# Patient Record
Sex: Female | Born: 1975
Health system: Southern US, Community
[De-identification: ages and names within clinical notes are randomized; demographics above are authoritative.]

## PROBLEM LIST (undated history)

## (undated) DIAGNOSIS — Z1379 Encounter for other screening for genetic and chromosomal anomalies: Principal | ICD-10-CM

## (undated) DIAGNOSIS — F32A Depression, unspecified: Secondary | ICD-10-CM

## (undated) DIAGNOSIS — K5900 Constipation, unspecified: Secondary | ICD-10-CM

## (undated) DIAGNOSIS — F329 Major depressive disorder, single episode, unspecified: Secondary | ICD-10-CM

## (undated) DIAGNOSIS — Z9641 Presence of insulin pump (external) (internal): Secondary | ICD-10-CM

## (undated) DIAGNOSIS — Z9889 Other specified postprocedural states: Secondary | ICD-10-CM

## (undated) DIAGNOSIS — Z973 Presence of spectacles and contact lenses: Secondary | ICD-10-CM

## (undated) DIAGNOSIS — K219 Gastro-esophageal reflux disease without esophagitis: Secondary | ICD-10-CM

## (undated) DIAGNOSIS — E049 Nontoxic goiter, unspecified: Secondary | ICD-10-CM

## (undated) DIAGNOSIS — R112 Nausea with vomiting, unspecified: Secondary | ICD-10-CM

## (undated) DIAGNOSIS — Z923 Personal history of irradiation: Secondary | ICD-10-CM

## (undated) DIAGNOSIS — C801 Malignant (primary) neoplasm, unspecified: Secondary | ICD-10-CM

## (undated) DIAGNOSIS — I509 Heart failure, unspecified: Secondary | ICD-10-CM

## (undated) DIAGNOSIS — E109 Type 1 diabetes mellitus without complications: Secondary | ICD-10-CM

## (undated) DIAGNOSIS — M65319 Trigger thumb, unspecified thumb: Secondary | ICD-10-CM

## (undated) HISTORY — PX: DILATION AND CURETTAGE OF UTERUS: SHX78

## (undated) HISTORY — DX: Encounter for other screening for genetic and chromosomal anomalies: Z13.79

## (undated) HISTORY — PX: TUBAL LIGATION: SHX77

---

## 1996-03-25 HISTORY — PX: DILATION AND CURETTAGE OF UTERUS: SHX78

## 1997-05-31 ENCOUNTER — Other Ambulatory Visit: Admission: RE | Admit: 1997-05-31 | Discharge: 1997-05-31 | Payer: Self-pay | Admitting: Obstetrics and Gynecology

## 1997-10-26 ENCOUNTER — Encounter: Admission: RE | Admit: 1997-10-26 | Discharge: 1998-01-24 | Payer: Self-pay | Admitting: Internal Medicine

## 1997-12-22 ENCOUNTER — Other Ambulatory Visit: Admission: RE | Admit: 1997-12-22 | Discharge: 1997-12-22 | Payer: Self-pay | Admitting: Obstetrics and Gynecology

## 1998-01-11 ENCOUNTER — Other Ambulatory Visit: Admission: RE | Admit: 1998-01-11 | Discharge: 1998-01-11 | Payer: Self-pay | Admitting: Obstetrics and Gynecology

## 1998-05-08 ENCOUNTER — Other Ambulatory Visit: Admission: RE | Admit: 1998-05-08 | Discharge: 1998-05-08 | Payer: Self-pay | Admitting: Obstetrics and Gynecology

## 1998-09-25 ENCOUNTER — Observation Stay (HOSPITAL_COMMUNITY): Admission: AD | Admit: 1998-09-25 | Discharge: 1998-09-26 | Payer: Self-pay | Admitting: Obstetrics and Gynecology

## 1998-09-26 ENCOUNTER — Encounter: Payer: Self-pay | Admitting: Obstetrics and Gynecology

## 1998-10-02 ENCOUNTER — Inpatient Hospital Stay (HOSPITAL_COMMUNITY): Admission: AD | Admit: 1998-10-02 | Discharge: 1998-10-02 | Payer: Self-pay | Admitting: Obstetrics and Gynecology

## 1998-10-21 ENCOUNTER — Encounter (INDEPENDENT_AMBULATORY_CARE_PROVIDER_SITE_OTHER): Payer: Self-pay

## 1998-10-21 ENCOUNTER — Inpatient Hospital Stay (HOSPITAL_COMMUNITY): Admission: AD | Admit: 1998-10-21 | Discharge: 1998-10-24 | Payer: Self-pay | Admitting: *Deleted

## 1998-10-25 ENCOUNTER — Encounter (HOSPITAL_COMMUNITY): Admission: RE | Admit: 1998-10-25 | Discharge: 1999-01-23 | Payer: Self-pay | Admitting: Obstetrics and Gynecology

## 1998-11-22 ENCOUNTER — Other Ambulatory Visit: Admission: RE | Admit: 1998-11-22 | Discharge: 1998-11-22 | Payer: Self-pay | Admitting: Obstetrics and Gynecology

## 1999-01-22 ENCOUNTER — Other Ambulatory Visit: Admission: RE | Admit: 1999-01-22 | Discharge: 1999-01-22 | Payer: Self-pay | Admitting: Obstetrics and Gynecology

## 1999-01-22 ENCOUNTER — Encounter (INDEPENDENT_AMBULATORY_CARE_PROVIDER_SITE_OTHER): Payer: Self-pay | Admitting: Specialist

## 1999-01-25 ENCOUNTER — Encounter (HOSPITAL_COMMUNITY): Admission: RE | Admit: 1999-01-25 | Discharge: 1999-04-25 | Payer: Self-pay | Admitting: Obstetrics and Gynecology

## 1999-07-02 ENCOUNTER — Other Ambulatory Visit: Admission: RE | Admit: 1999-07-02 | Discharge: 1999-07-02 | Payer: Self-pay | Admitting: Obstetrics and Gynecology

## 2000-03-24 ENCOUNTER — Other Ambulatory Visit: Admission: RE | Admit: 2000-03-24 | Discharge: 2000-03-24 | Payer: Self-pay | Admitting: Obstetrics and Gynecology

## 2001-02-11 ENCOUNTER — Other Ambulatory Visit: Admission: RE | Admit: 2001-02-11 | Discharge: 2001-02-11 | Payer: Self-pay | Admitting: Obstetrics and Gynecology

## 2001-07-17 ENCOUNTER — Inpatient Hospital Stay (HOSPITAL_COMMUNITY): Admission: AD | Admit: 2001-07-17 | Discharge: 2001-07-17 | Payer: Self-pay | Admitting: Obstetrics and Gynecology

## 2001-08-17 ENCOUNTER — Inpatient Hospital Stay (HOSPITAL_COMMUNITY): Admission: AD | Admit: 2001-08-17 | Discharge: 2001-08-17 | Payer: Self-pay | Admitting: Obstetrics and Gynecology

## 2001-08-19 ENCOUNTER — Ambulatory Visit (HOSPITAL_COMMUNITY): Admission: RE | Admit: 2001-08-19 | Discharge: 2001-08-19 | Payer: Self-pay | Admitting: Obstetrics and Gynecology

## 2001-08-20 ENCOUNTER — Inpatient Hospital Stay (HOSPITAL_COMMUNITY): Admission: AD | Admit: 2001-08-20 | Discharge: 2001-08-23 | Payer: Self-pay | Admitting: Obstetrics and Gynecology

## 2001-10-21 ENCOUNTER — Other Ambulatory Visit: Admission: RE | Admit: 2001-10-21 | Discharge: 2001-10-21 | Payer: Self-pay | Admitting: Obstetrics and Gynecology

## 2002-11-15 ENCOUNTER — Other Ambulatory Visit: Admission: RE | Admit: 2002-11-15 | Discharge: 2002-11-15 | Payer: Self-pay | Admitting: Obstetrics and Gynecology

## 2003-03-26 HISTORY — PX: LAPAROSCOPIC UNILATERAL SALPINGECTOMY: SHX5934

## 2003-03-26 HISTORY — PX: ESSURE TUBAL LIGATION: SUR464

## 2004-08-14 ENCOUNTER — Other Ambulatory Visit: Admission: RE | Admit: 2004-08-14 | Discharge: 2004-08-14 | Payer: Self-pay | Admitting: Obstetrics and Gynecology

## 2006-05-29 ENCOUNTER — Emergency Department (HOSPITAL_COMMUNITY): Admission: EM | Admit: 2006-05-29 | Discharge: 2006-05-30 | Payer: Self-pay | Admitting: Emergency Medicine

## 2007-03-02 ENCOUNTER — Ambulatory Visit (HOSPITAL_COMMUNITY): Admission: RE | Admit: 2007-03-02 | Discharge: 2007-03-02 | Payer: Self-pay | Admitting: Obstetrics and Gynecology

## 2007-03-26 HISTORY — PX: LAPAROSCOPIC UNILATERAL SALPINGECTOMY: SHX5934

## 2007-04-02 ENCOUNTER — Ambulatory Visit (HOSPITAL_COMMUNITY): Admission: RE | Admit: 2007-04-02 | Discharge: 2007-04-02 | Payer: Self-pay | Admitting: Obstetrics and Gynecology

## 2007-04-02 HISTORY — PX: LAPAROSCOPIC TUBAL LIGATION: SUR803

## 2010-04-15 ENCOUNTER — Encounter: Payer: Self-pay | Admitting: Obstetrics and Gynecology

## 2010-08-07 NOTE — Op Note (Signed)
NAME:  Stephanie Holden, Stephanie Holden                 ACCOUNT NO.:  [id number]   MEDICAL RECORD NO.:  [id number]          PATIENT TYPE:  AMB   LOCATION:  SDC                           FACILITY:  [facility]   PHYSICIAN:  Dineen Kid. Rana Snare, M.D.    DATE OF BIRTH:  06/27/1975   DATE OF PROCEDURE:  04/02/2007  DATE OF DISCHARGE:                               OPERATIVE REPORT   PREOPERATIVE DIAGNOSES:  1. Multiparity, desires sterility.  2. Failed Essure procedure.   POSTOPERATIVE DIAGNOSES:  1. Multiparity, desires sterility.  2. Failed Essure procedure.   PROCEDURE:  Laparoscopic bilateral tubal ligation with removal of Essure  device.   SURGEON:  Dineen Kid. Rana Snare, M.D.   ANESTHESIA:  General endotracheal.   INDICATIONS:  Stephanie Holden is a 75 year old G3, P2, A1, who had undergone an  Essure procedure for sterilization.  the procedure was complicated by  finding an extratubal ligation of the Essure device on HSG at 3 months'  time, showing also left tubal patency.  She desires definitive surgical  removal of the Essure device and tubal ligation.  We had discussed the  risks and benefits of the procedure at length, which include but not  limited to risk of infection, bleeding, damage to the uterus, tubes,  ovaries, bowel or bladder, possibility of the need to resect or remove  the left fallopian tube, risk of tubal failure quoted at 07/998.  She  does give her informed consent and wished to proceed.   FINDINGS AT THE TIME OF SURGERY:  A retroverted uterus with mild  endometriosis in the cul-de-sac.  The left fallopian tube is distorted  and the Essure device was superficially adhered to the left fundal wall  of the uterus with the end of the device just near the cornu of the  fallopian tube.  The fallopian tube deviated at a 90-degree after  insertion into the uterus and was of in such a position that placement  of the Essure device most likely was not likely due to the degree of  distortion of the fallopian tube.   The device had completely perforated  through the fallopian tube and was sitting on the outside portion of the  uterus with no adhesions to anything other than the uterus.  The she had  a normal-appearing liver, uterus, ovaries and, again, small  endometriosis implants in the cul-de-sac.   DESCRIPTION OF PROCEDURE:  After adequate analgesia, the patient placed  in the dorsal lithotomy position.  She is sterilely prepped and draped,  the bladder is sterilely drained.  A Graves speculum is placed and a  tenaculum placed on the anterior lip of the cervix.  A Cohen tenaculum  is placed.  A 1-cm infraumbilical skin incision was made.  A Veress  needle was inserted.  The abdomen was insufflated to dullness to  percussion.  An 11-mm trocar was inserted.  The above findings were  noted.  A 5-mm trocar was inserted to the left of the midline two  fingerbreadths above the pubic symphysis under direct visualization.  The device was easily grasped and removed without  injury to the  surrounding bowel or uterus and, again, was slightly adhered to the  outside portion of the uterus.  After careful evaluation of both  fallopian tubes, the midportion of each fallopian tube was grasped,  cautery was carried out over the 3-4 cm section of fallopian tube with  good thermal burn noted, loss of resistance noted on the ohmmeter, and  the fallopian tubes were confirmed by the fimbriated ends before the  cautery.  Careful examination of the fallopian tubes again revealed good  hemostasis and good thermal burn noted throughout the midportion of the  fallopian tubes.  The cul-de-sac endometriosis implants were ablated  with bipolar cautery, good thermal burn noted, and the implants ablated  completely.  The abdomen was then desufflated, trocars removed.  The  infraumbilical skin incision was closed with 0 Vicryl interrupted suture  in the fascia, 3-0 Vicryl Rapide subcuticular suture in the skin.  A 3-0  Vicryl  Rapide was also used to close the 5-mm site with good  approximation and good hemostasis.  Dermabond was then placed and 0.25%  Marcaine total of 8 mL used to inject the incisions.  The patient  tolerated the procedure well, was stable on transfer to the recovery  room.  Sponges and instrument count was normal x3.  The patient did  receive 1 g of cefotetan preoperatively and 30 mg of Toradol  postoperatively.   DISPOSITION:  The patient will be discharged home, will follow up in the  office in 2-3 weeks.  Sent home with a routine instruction sheet for  laparoscopy.  Told to return for increased pain, for bleeding.  She also  has prescription for Vicodin #20.      Dineen Kid Rana Snare, M.D.  Electronically Signed     DCL/MEDQ  D:  04/02/2007  T:  04/02/2007  Job:  [id number]

## 2010-08-10 NOTE — Op Note (Signed)
[facility]  Patient:    Stephanie Holden, Stephanie Holden Visit Number: [id number] MRN: [id number]          Service Type: OBS Location: 910A 9106 01 Attending Physician:  Trevor Iha Dictated by:   Trevor Iha, M.D. Proc. Date: 08/20/01 Admit Date:  08/20/2001                             Operative Report  PREOPERATIVE DIAGNOSES:       Intrauterine pregnancy at 65 1/[redacted] weeks gestational age, insulin-dependent diabetes, mature amniocentesis, previous cesarean section with desired repeat, spontaneous rupture of membranes.  POSTOPERATIVE DIAGNOSES:      Intrauterine pregnancy at 89 1/[redacted] weeks gestational age, insulin-dependent diabetes, mature amniocentesis, previous cesarean section with desired repeat, spontaneous rupture of membranes.  PROCEDURE:                    Repeat low segment transverse cesarean section.  SURGEON:                      Trevor Iha, M.D.  ANESTHESIA:                   Spinal.  ESTIMATED BLOOD LOSS:         800 cc.  INDICATIONS:                  Ms. Yin is a 35 year old G3, P1, A1 at 23 4/7 weeks who presents for repeat cesarean section.  She had an amniocentesis yesterday which showed a mature LS ratio and PG.  Previous cesarean section with desired repeat.  She presents for repeat cesarean section.  Fifteen minutes before the surgery she had spontaneous rupture of membranes.  Because of unknown GBS, went ahead and gave her 1 g of Cefotetan preoperatively. Risks and benefits were discussed.  Informed consent was obtained.  DESCRIPTION OF PROCEDURE:     After adequate analgesia the patient was placed in the supine position left lateral tilt.  She was sterilely prepped and draped.  The bladder was sterilely drained with a Foley catheter.  A Pfannenstiel skin incision was made two fingerbreadths above the pubic symphysis which was taken down sharply to the fascia, incised transversely, extended superiorly and  inferiorly off the bellies of the rectus muscles which were separated sharply in the midline.  Peritoneum was entered sharply. Bladder blade was placed.  Uterine serosa was elevated, nicked, incised transversely.  Bladder flap was created and placed behind the bladder blade. A low segment myotomy incision was made down to the infants vertex, extended laterally with the operators fingertips.  Infants vertex was delivered atraumatically.  The nares and pharynx were then suctioned.  Nuchal cord x1 was reduced.  The infant was then delivered.  Cord clamped and cut and handed to the pediatrician for resuscitation.  Cord blood was then obtained.  The placenta was extracted manually.  The uterus was exteriorized, wiped clean with a dry lap.  The myotomy incision was closed in two layers, the first being a running locking layer, the second being an embrocating layer with 0 Monocryl suture.  Hemostasis was then achieved with a suture of figure-of-eight of 0 Monocryl.  Uterus was placed back in the peritoneal cavity and after a copious amount of irrigation adequate hemostasis was assured.  The peritoneum was then closed with 0 Monocryl in a running fashion, rectus muscle plicated in  the midline.  The fascia was then closed with a #1 Vicryl in a running fashion.  Irritation was applied and after adequate hemostasis the skin was stapled and Steri-Strips applied.  The patient tolerated procedure well.  Was stable on transfer to the recovery room. Sponge and instrument count was normal x3.  Estimated blood loss 800 cc. Dictated by:   Trevor Iha, M.D. Attending Physician:  Trevor Iha DD:  08/20/01 TD:  08/21/01 Job: [id number]

## 2010-08-10 NOTE — Discharge Summary (Signed)
[facility]  Patient:    Stephanie Holden, Stephanie Holden Visit Number: [id number] MRN: [id number]          Service Type: OBS Location: 910A 9106 01 Attending Physician:  Trevor Iha Dictated by:   Sharene Skeans, N.P. Admit Date:  08/20/2001 Discharge Date: 08/23/2001                             Discharge Summary  ADMISSION DIAGNOSES: 1. Intrauterine pregnancy at 36-5/[redacted] weeks gestation. 2. Insulin-dependent diabetes mellitus with mature amniocentesis. 3. History of previous cesarean delivery, desires a repeat. 4. Spontaneous rupture of membranes.  DISCHARGE DIAGNOSIS:  Status post low transverse cesarean delivery to a viable female infant.  PROCEDURE:  Repeat low transverse cesarean delivery.  REASON FOR ADMISSION:  Please see dictated H&P.  HOSPITAL COURSE:  The patient was admitted to [facility] for the above named procedure at 36-4/[redacted] weeks gestation.  The patient had amniocentesis on previous day, which showed mature LS ratio with PG. Approximately 15 minutes prior to surgery, the patient spontaneously had rupture of membranes.  Due to unknown group B beta Strep status, IV antibiotics were administered.  The patient was taken to the operating room where spinal anesthesia was administered without complication.  A low transverse incision was made with the delivery of a viable female infant weighing 7 pounds 7 ounces.  Apgars were 9 at one minute and 9 at five minutes.  Umbilical cord pH was 7.39.  The patient tolerated the procedure well, and was taken to the recovery room in good condition.  On postoperative day #1, the patient had good return of bowel function.  She was tolerating a clear liquid diet without complaints of nausea and vomiting. Insulin drip was discontinued.  Low glucose level was noted at 111.  The patient was restarted on her own insulin pump.  Abdomen was soft.  Abdominal dressing was noted to have old  drainage noted on dressing.  A dressing was partially removed.  Incision was clean, dry, and intact.  Foley was discontinued, and the patient was voiding without difficulty.  Labs revealed a hemoglobin of 12.9, hematocrit 38.0, white blood cell count 10.3.  On postoperative day #2, abdomen was soft.  The patient was tolerating a regular diet without complaints of nausea and vomiting.  The patient was ambulating without difficulty, blood sugars were within normal limits.  On postoperative day #3, the patient was doing well.  Fundus was firm. Abdomen was soft.  Incision was clean, dry, and intact.  Staples were removed, and the patient was discharged home.  CONDITION ON DISCHARGE:  Good.  DIET:  Regular as tolerated with ADA diet.  ACTIVITY:  No heavy lifting, no driving x2 weeks, no vaginal entry.  FOLLOWUP:  The patient is to follow up in the office in one to two weeks for an incision check.  She is to call for a temperature greater than 100 degrees, persistent nausea and vomiting, heavy vaginal bleeding, and/or redness or drainage from the incision site.  DISCHARGE MEDICATIONS: 1. Tylox one q.4-6h. p.r.n. 2. Ibuprofen 600 mg q.6h. 3. Prenatal vitamins one p.o. q.d. 4. Colace one p.o. q.d. p.r.n. 5. Insulin pump per protocol. Dictated by:   Sharene Skeans, N.P. Attending Physician:  Trevor Iha DD:  09/09/01 TD:  09/10/01 Job: [id number]

## 2010-08-10 NOTE — H&P (Signed)
[facility]  Patient:    Stephanie Holden, Stephanie Holden Visit Number: [id number] MRN: [id number]          Service Type: OBS Location: MATC Attending Physician:  Soledad Gerlach Dictated by:   Trevor Iha, M.D. Admit Date:  08/17/2001 Discharge Date: 08/17/2001                           History and Physical  DATE OF BIRTH:                [birth year]  HISTORY OF PRESENT ILLNESS:   Stephanie Holden is a 35 year old, G3, P1, A1, at 36-4/7 weeks, who presents for repeat cesarean section.  Her pregnancy has been complicated by insulin-dependent diabetes controlled on insulin pump. She had preterm labor previously on Terbutaline.  She had an amniocentesis on Aug 19, 2001, with LS ratio of 4.2:1 and with PG consistent with a mature amniocentesis.  She also has a previous cesarean section and desired repeat cesarean section and presents for that today.  Her estimated date of confinement is September 12, 2001.  LABORATORY DATA:              She is O positive, antibody negative. VDRL nonreactive, rubella immune, hepatitis B negative.  PAST MEDICAL HISTORY:         Juvenile diabetes, beginning at age 62 on insulin pump.  She sees Dr. Wylene Simmer.  PAST SURGICAL HISTORY:        Cesarean section due to breech and laser surgery for abnormal Pap smear and a D&C in 1998 for a miscarriage.  MEDICATIONS:                  1. Insulin.                               2. Prenatal vitamins.  ALLERGIES:                    No known drug allergies.  PHYSICAL EXAMINATION:  VITAL SIGNS:                  Blood pressure is 122/84.  HEART:                        Regular rate and rhythm.  LUNGS:                        Clear to auscultation bilaterally.  ABDOMEN:                      Nondistended, nontender, gravid.  PELVIC:                       Cervix is closed, thick, and high.  IMPRESSION:                   1. Intrauterine pregnancy at 36-1/2 weeks.            2. Mature amniocentesis.                               3. Previous cesarean section.  4. Insulin-dependent diabetes.  PLAN:                         Repeat low segment transverse cesarean section. Risks and benefits were discussed at length which include but are not limited to risk of infection; bleeding; damage to uterus, tubes, ovaries, bowel, bladder, or fetus.  The patient does give her informed consent. Dictated by:   Trevor Iha, M.D. Attending Physician:  Soledad Gerlach DD:  08/19/01 TD:  08/19/01 Job: [id number]

## 2010-12-12 LAB — CBC
MCHC: 34.2
RDW: 13.1

## 2010-12-12 LAB — PREGNANCY, URINE: Preg Test, Ur: NEGATIVE

## 2010-12-12 LAB — BASIC METABOLIC PANEL
BUN: 10
Calcium: 9.3
Creatinine, Ser: 0.65
GFR calc Af Amer: >60

## 2011-10-08 ENCOUNTER — Other Ambulatory Visit: Payer: Self-pay | Admitting: Internal Medicine

## 2011-10-08 DIAGNOSIS — E041 Nontoxic single thyroid nodule: Secondary | ICD-10-CM

## 2011-10-16 ENCOUNTER — Other Ambulatory Visit (HOSPITAL_COMMUNITY)
Admission: RE | Admit: 2011-10-16 | Discharge: 2011-10-16 | Disposition: A | Discharge status: Home / Self Care | Payer: BC Managed Care – PPO | Source: Ambulatory Visit | Attending: Diagnostic Radiology | Admitting: Diagnostic Radiology

## 2011-10-16 ENCOUNTER — Ambulatory Visit
Admission: RE | Admit: 2011-10-16 | Discharge: 2011-10-16 | Disposition: A | Discharge status: Home / Self Care | Payer: BC Managed Care – PPO | Source: Ambulatory Visit | Attending: Internal Medicine | Admitting: Internal Medicine

## 2011-10-16 DIAGNOSIS — E041 Nontoxic single thyroid nodule: Secondary | ICD-10-CM

## 2011-10-16 DIAGNOSIS — E049 Nontoxic goiter, unspecified: Secondary | ICD-10-CM | POA: Insufficient documentation

## 2013-01-12 ENCOUNTER — Encounter: Payer: Self-pay | Admitting: Gynecology

## 2013-01-12 ENCOUNTER — Ambulatory Visit (INDEPENDENT_AMBULATORY_CARE_PROVIDER_SITE_OTHER): Payer: 59 | Admitting: Gynecology

## 2013-01-12 VITALS — BP 142/88 | Ht 63.0 in | Wt 147.0 lb

## 2013-01-12 DIAGNOSIS — N3000 Acute cystitis without hematuria: Secondary | ICD-10-CM

## 2013-01-12 DIAGNOSIS — E109 Type 1 diabetes mellitus without complications: Secondary | ICD-10-CM | POA: Insufficient documentation

## 2013-01-12 DIAGNOSIS — R319 Hematuria, unspecified: Secondary | ICD-10-CM

## 2013-01-12 LAB — POCT URINALYSIS DIPSTICK
Protein, UA: NEGATIVE
Urobilinogen, UA: NEGATIVE
pH, UA: 8

## 2013-01-12 MED ORDER — PHENAZOPYRIDINE HCL 200 MG PO TABS: 200.0000 mg | ORAL_TABLET | Freq: Three times a day (TID) | ORAL | Status: DC | PRN

## 2013-01-12 MED ORDER — CIPROFLOXACIN HCL 500 MG PO TABS: 500.0000 mg | ORAL_TABLET | Freq: Two times a day (BID) | ORAL | Status: DC

## 2013-01-12 NOTE — Progress Notes (Signed)
Pt here complaining of urinary frequency and urgency that began yesterday.  She started drinking cranberry juice and increased water but reports this am she noticed blood in her urine.  She denies any fever or chills and denies any history of recurrent UTI-last one was years ago.  Pt is long standing diabetic on pump with good control and reports that she has normal renal function.  Review of Systems  Constitutional: Negative for fever and chills.  Gastrointestinal: Negative for nausea and vomiting.  Genitourinary: Positive for dysuria, urgency, frequency and hematuria. Negative for flank pain.   There were no vitals taken for this visit. General appearance: alert, cooperative and appears stated age Back: no tenderness to percussion or palpation, no CVAT Neurologic: Grossly normal   Assessment:  Acute cystitis  Plan:   Cirprofloxin, pyridium see orders Urine culture

## 2013-01-13 ENCOUNTER — Ambulatory Visit: Payer: Self-pay | Admitting: Gynecology

## 2013-01-13 ENCOUNTER — Encounter: Payer: Self-pay | Admitting: Gynecology

## 2013-01-14 LAB — URINE CULTURE

## 2013-03-29 ENCOUNTER — Ambulatory Visit (INDEPENDENT_AMBULATORY_CARE_PROVIDER_SITE_OTHER): Payer: 59 | Admitting: Gynecology

## 2013-03-29 ENCOUNTER — Encounter: Payer: Self-pay | Admitting: Gynecology

## 2013-03-29 VITALS — BP 128/90 | HR 82 | Resp 14 | Ht 63.0 in | Wt 152.0 lb

## 2013-03-29 DIAGNOSIS — Z01419 Encounter for gynecological examination (general) (routine) without abnormal findings: Secondary | ICD-10-CM

## 2013-03-29 DIAGNOSIS — N63 Unspecified lump in unspecified breast: Secondary | ICD-10-CM

## 2013-03-29 DIAGNOSIS — Z124 Encounter for screening for malignant neoplasm of cervix: Secondary | ICD-10-CM

## 2013-03-29 DIAGNOSIS — N6320 Unspecified lump in the left breast, unspecified quadrant: Secondary | ICD-10-CM

## 2013-03-29 NOTE — Progress Notes (Signed)
38 y.o. Married Caucasian female   [phone] here for annual exam. Pt is currently sexually active. Cycles are regular, flow for 3-4d, no dyspareunia, no issues.  Diabetes well controled-HbA1c- 7.8.  Patient's last menstrual period was 03/08/2013.          Sexually active: yes  The current method of family planning is tubal ligation.    Exercising: yes  free weights 7x/qd  Last pap: 01/2012 Normal Alcohol: 1 drink/wk (social) Tobacco: no BSE:yes  Labs- Endocrinologist  Health Maintenance  Topic Date Due  . Pap Smear  07/21/1994  . Tetanus/tdap  07/21/1995  . Influenza Vaccine  10/23/2012    History reviewed. No pertinent family history.  Patient Active Problem List   Diagnosis Date Noted  . Diabetes type 1, controlled 01/12/2013    Past Medical History  Diagnosis Date  . Diabetes type 1, controlled     Past Surgical History  Procedure Laterality Date  . Cesarean section  2000  . Cesarean section  2003  . Laparoscopy with tubal ligation  2005    left salpingectomy  . Essure tubal ligation  2005    failled, tubal puncture    Allergies: Review of patient's allergies indicates no known allergies.  Current Outpatient Prescriptions  Medication Sig Dispense Refill  . benazepril (LOTENSIN) 10 MG tablet Take 10 mg by mouth daily. For kidney      . insulin lispro (HUMALOG) 100 UNIT/ML injection Inject into the skin 3 (three) times daily before meals.       No current facility-administered medications for this visit.    ROS: Pertinent items are noted in HPI.  Exam:    BP 128/90  Pulse 82  Resp 14  Ht 5\' 3"  (1.6 m)  Wt 152 lb (68.947 kg)  BMI 26.93 kg/m2  LMP 03/08/2013 Weight change: @WEIGHTCHANGE @ Last 3 height recordings:  Ht Readings from Last 3 Encounters:  03/29/13 5\' 3"  (1.6 m)  01/12/13 5\' 3"  (1.6 m)   General appearance: alert, cooperative and appears stated age Head: Normocephalic, without obvious abnormality, atraumatic Neck: no adenopathy, no  carotid bruit, no JVD, supple, symmetrical, trachea midline and thyroid not enlarged, symmetric, no tenderness/mass/nodules, small nodule on right Lungs: clear to auscultation bilaterally Breasts: normal appearance, no tenderness.  Cyst left breast at 10 o'clock at nipple, mobile Heart: regular rate and rhythm, S1, S2 normal, no murmur, click, rub or gallop Abdomen: soft, non-tender; bowel sounds normal; no masses,  no organomegaly Extremities: extremities normal, atraumatic, no cyanosis or edema Skin: Skin color, texture, turgor normal. No rashes or lesions Lymph nodes: Cervical, supraclavicular, and axillary nodes normal. no inguinal nodes palpated Neurologic: Grossly normal   Pelvic: External genitalia:  no lesions              Urethra: normal appearing urethra with no masses, tenderness or lesions              Bartholins and Skenes: normal                 Vagina: normal appearing vagina with normal color and discharge, no lesions              Cervix: normal appearance              Pap taken: yes        Bimanual Exam:  Uterus:  uterus is normal size, shape, consistency and nontender  Adnexa:    normal adnexa in size, nontender and no masses                                      Rectovaginal: Confirms                                      Anus:  normal sphincter tone, no lesions  A: well woman Left breast mass-cyst? thyroid nodule-biopsy negative    P: mammogram with u/s pap smear with HRHPV return annually or prn   An After Visit Summary was printed and given to the patient.

## 2013-03-30 ENCOUNTER — Other Ambulatory Visit: Payer: Self-pay | Admitting: Gynecology

## 2013-03-30 DIAGNOSIS — N6320 Unspecified lump in the left breast, unspecified quadrant: Secondary | ICD-10-CM

## 2013-04-01 LAB — IPS PAP TEST WITH HPV

## 2013-04-09 ENCOUNTER — Ambulatory Visit
Admission: RE | Admit: 2013-04-09 | Discharge: 2013-04-09 | Disposition: A | Discharge status: Home / Self Care | Payer: 59 | Source: Ambulatory Visit | Attending: Gynecology | Admitting: Gynecology

## 2013-04-09 DIAGNOSIS — N6320 Unspecified lump in the left breast, unspecified quadrant: Secondary | ICD-10-CM

## 2013-07-11 ENCOUNTER — Encounter (HOSPITAL_COMMUNITY): Payer: Self-pay | Admitting: Emergency Medicine

## 2013-07-11 ENCOUNTER — Emergency Department (HOSPITAL_COMMUNITY): Payer: 59

## 2013-07-11 ENCOUNTER — Emergency Department (HOSPITAL_COMMUNITY)
Admission: EM | Admit: 2013-07-11 | Discharge: 2013-07-11 | Disposition: A | Discharge status: Home / Self Care | Payer: 59 | Attending: Emergency Medicine | Admitting: Emergency Medicine

## 2013-07-11 DIAGNOSIS — S93409A Sprain of unspecified ligament of unspecified ankle, initial encounter: Secondary | ICD-10-CM | POA: Insufficient documentation

## 2013-07-11 DIAGNOSIS — E119 Type 2 diabetes mellitus without complications: Secondary | ICD-10-CM | POA: Insufficient documentation

## 2013-07-11 DIAGNOSIS — Y92838 Other recreation area as the place of occurrence of the external cause: Secondary | ICD-10-CM

## 2013-07-11 DIAGNOSIS — Z87891 Personal history of nicotine dependence: Secondary | ICD-10-CM | POA: Insufficient documentation

## 2013-07-11 DIAGNOSIS — X503XXA Overexertion from repetitive movements, initial encounter: Secondary | ICD-10-CM | POA: Insufficient documentation

## 2013-07-11 DIAGNOSIS — S93402A Sprain of unspecified ligament of left ankle, initial encounter: Secondary | ICD-10-CM

## 2013-07-11 DIAGNOSIS — Y9301 Activity, walking, marching and hiking: Secondary | ICD-10-CM | POA: Insufficient documentation

## 2013-07-11 DIAGNOSIS — Z794 Long term (current) use of insulin: Secondary | ICD-10-CM | POA: Insufficient documentation

## 2013-07-11 DIAGNOSIS — Y9239 Other specified sports and athletic area as the place of occurrence of the external cause: Secondary | ICD-10-CM | POA: Insufficient documentation

## 2013-07-11 MED ORDER — HYDROCODONE-ACETAMINOPHEN 5-325 MG PO TABS
1.0000 | ORAL_TABLET | Freq: Once | ORAL | Status: AC
  Administered 2013-07-11: 1 via ORAL
  Filled 2013-07-11: qty 1

## 2013-07-11 MED ORDER — HYDROCODONE-ACETAMINOPHEN 5-325 MG PO TABS: 1.0000 | ORAL_TABLET | Freq: Four times a day (QID) | ORAL | Status: DC | PRN

## 2013-07-11 MED ORDER — ETODOLAC ER 400 MG PO TB24: 400.0000 mg | ORAL_TABLET | Freq: Every day | ORAL | Status: DC

## 2013-07-11 MED ORDER — IBUPROFEN 400 MG PO TABS
600.0000 mg | ORAL_TABLET | Freq: Once | ORAL | Status: AC
  Administered 2013-07-11: 600 mg via ORAL
  Filled 2013-07-11 (×2): qty 1

## 2013-07-11 NOTE — Progress Notes (Signed)
Orthopedic Tech Progress Note Patient Details:  Stephanie Holden 1975/05/19 [id number]  Ortho Devices Type of Ortho Device: ASO;Crutches Ortho Device/Splint Location: LLE Ortho Device/Splint Interventions: Ordered;Application   Braulio Bosch 07/11/2013, 7:07 PM

## 2013-07-11 NOTE — ED Notes (Signed)
Ice pack applied to left ankle.

## 2013-07-11 NOTE — Discharge Instructions (Signed)
Keep your ankle elevated, ice several times a day, day off of it for one to 2 days. Keep ASO splint when walking around. Crutches for ambulation for several days. Followup with orthopedics doctor within the next week for recheck.   Ankle Sprain An ankle sprain is an injury to the strong, fibrous tissues (ligaments) that hold the bones of your ankle joint together.  CAUSES An ankle sprain is usually caused by a fall or by twisting your ankle. Ankle sprains most commonly occur when you step on the outer edge of your foot, and your ankle turns inward. People who participate in sports are more prone to these types of injuries.  SYMPTOMS   Pain in your ankle. The pain may be present at rest or only when you are trying to stand or walk.  Swelling.  Bruising. Bruising may develop immediately or within 1 to 2 days after your injury.  Difficulty standing or walking, particularly when turning corners or changing directions. DIAGNOSIS  Your caregiver will ask you details about your injury and perform a physical exam of your ankle to determine if you have an ankle sprain. During the physical exam, your caregiver will press on and apply pressure to specific areas of your foot and ankle. Your caregiver will try to move your ankle in certain ways. An X-ray exam may be done to be sure a bone was not broken or a ligament did not separate from one of the bones in your ankle (avulsion fracture).  TREATMENT  Certain types of braces can help stabilize your ankle. Your caregiver can make a recommendation for this. Your caregiver may recommend the use of medicine for pain. If your sprain is severe, your caregiver may refer you to a surgeon who helps to restore function to parts of your skeletal system (orthopedist) or a physical therapist. [place] ice to your injury for 1 2 days or as directed by your caregiver. Applying ice helps to reduce inflammation and pain.  Put ice in a plastic  bag.  Place a towel between your skin and the bag.  Leave the ice on for 15-20 minutes at a time, every 2 hours while you are awake.  Only take over-the-counter or prescription medicines for pain, discomfort, or fever as directed by your caregiver.  Elevate your injured ankle above the level of your heart as much as possible for 2 3 days.  If your caregiver recommends crutches, use them as instructed. Gradually put weight on the affected ankle. Continue to use crutches or a cane until you can walk without feeling pain in your ankle.  If you have a plaster splint, wear the splint as directed by your caregiver. Do not rest it on anything harder than a pillow for the first 24 hours. Do not put weight on it. Do not get it wet. You may take it off to take a shower or bath.  You may have been given an elastic bandage to wear around your ankle to provide support. If the elastic bandage is too tight (you have numbness or tingling in your foot or your foot becomes cold and blue), adjust the bandage to make it comfortable.  If you have an air splint, you may blow more air into it or let air out to make it more comfortable. You may take your splint off at night and before taking a shower or bath. Wiggle your toes in the splint several times per day to decrease swelling. [place]  CARE IF:   You have rapidly increasing bruising or swelling.  Your toes feel extremely cold or you lose feeling in your foot.  Your pain is not relieved with medicine. SEEK IMMEDIATE MEDICAL CARE IF:  Your toes are numb or blue.  You have severe pain that is increasing. MAKE SURE YOU:   Understand these instructions.  Will watch your condition.  Will get help right away if you are not doing well or get worse. Document Released: 03/11/2005 Document Revised: 12/04/2011 Document Reviewed: 03/23/2011 [facility] Patient Information 2014 [place], [place].

## 2013-07-11 NOTE — ED Notes (Signed)
Pt returned from xray    Family at bedside

## 2013-07-11 NOTE — Progress Notes (Signed)
Orthopedic Tech Progress Note Patient Details:  Stephanie Holden May 19, 1975 [id number]  Ortho Devices Type of Ortho Device: ASO;Crutches Ortho Device/Splint Location: LLE Ortho Device/Splint Interventions: Ordered;Application   Braulio Bosch 07/11/2013, 7:19 PM

## 2013-07-11 NOTE — ED Provider Notes (Signed)
CSN: [id number]     Arrival date & time 07/11/13  1622 History  This chart was scribed for non-physician practitioner working with Blanchie Dessert, MD by Mercy Moore, ED Scribe. This patient was seen in room TR10C/TR10C and the patient's care was started at 5:32 PM.   Chief Complaint  Patient presents with  . Foot Injury    Left foot      The history is provided by the patient. No language interpreter was used.   HPI Comments: Stephanie Holden is a 38 y.o. female who presents to the Emergency Department complaining of left ankle pain due to injury that occurred this afternoon about 2 hours ago. Patient reports stepping down from bleachers at her daughter's gymnastics meet and rolling her ankle. Patient has iced and bandaged the ankle.  Patient has not taken any pain medication. Patient reports previous left ankle injury a few years ago: sprain.  No broken bones. Patient states pain is worse with movement and unable to bear any weight on the ankle. Notes some knee pain as well. No foot pain.  Husband shares that patient is diabetic and her blood pressure was elevated today.   Past Medical History  Diagnosis Date  . Diabetes type 1, controlled    Past Surgical History  Procedure Laterality Date  . Cesarean section  2000  . Cesarean section  2003  . Laparoscopy with tubal ligation  2005    left salpingectomy  . Essure tubal ligation  2005    failled, tubal puncture   No family history on file. History  Substance Use Topics  . Smoking status: Former Research scientist (life sciences)  . Smokeless tobacco: Not on file  . Alcohol Use: Yes   OB History   Grav Para Term Preterm Abortions TAB SAB Ect Mult Living   3 2 1 1 1  1   2      Review of Systems  Musculoskeletal: Positive for arthralgias and joint swelling.  Neurological: Negative for weakness and numbness.      Allergies  Dilaudid  Home Medications   Prior to Admission medications   Medication Sig Start Date End Date Taking? Authorizing  Provider  benazepril (LOTENSIN) 10 MG tablet Take 10 mg by mouth daily. For kidney    Historical Provider, MD  insulin lispro (HUMALOG) 100 UNIT/ML injection Inject into the skin 3 (three) times daily before meals.    Historical Provider, MD   Triage Vitals: BP 141/86  Pulse 90  Temp(Src) 99.3 F (37.4 C) (Oral)  Resp 18  Ht 5\' 2"  (1.575 m)  Wt 150 lb (68.04 kg)  BMI 27.43 kg/m2  SpO2 99%  LMP 06/24/2013 Physical Exam  Nursing note and vitals reviewed. Constitutional: She is oriented to person, place, and time. She appears well-developed and well-nourished. No distress.  HENT:  Head: Normocephalic and atraumatic.  Eyes: EOM are normal.  Neck: Neck supple. No tracheal deviation present.  Cardiovascular: Normal rate.   Pulmonary/Chest: Effort normal. No respiratory distress.  Musculoskeletal: She exhibits tenderness.  Mild swelling noted to the left lateral malleolus of the ankle. Tender to palpation over medial and lateral malleoli. Pain with range of motion of the ankle in any direction. Foot is normal, no foot tenderness, dorsal pedal pulses intact. Knee tender over her lateral joint. Full range of motion of the knee joint. Joint is stable.  Neurological: She is alert and oriented to person, place, and time.  Skin: Skin is warm and dry.  Psychiatric: She has a normal mood  and affect. Her behavior is normal.    ED Course  Procedures (including critical care time) DIAGNOSTIC STUDIES: Oxygen Saturation is 99% on room air, normal by my interpretation.    COORDINATION OF CARE: 5:35 PM- Will order pain medication and X-ray of left ankle and knee. Discussed treatment plan with patient at bedside and patient agreed to plan.    Labs Review Labs Reviewed - No data to display  Imaging Review No results found.   EKG Interpretation None      MDM   Final diagnoses:  Sprain of ankle, left   Patient with a left ankle injury after stepping off of bleachers. X-rays obtained and  are negative. Joint is stable. Home with ASO splint, crutches, NSAIDs, ice, elevation, Norco 15 tablets for severe pain. Patient is to followup with orthopedic specialist.   I personally performed the services described in this documentation, which was scribed in my presence. The recorded information has been reviewed and is accurate.   Renold Genta, PA-C 07/11/13 1849

## 2013-07-11 NOTE — ED Provider Notes (Signed)
Medical screening examination/treatment/procedure(s) were performed by non-physician practitioner and as supervising physician I was immediately available for consultation/collaboration.   EKG Interpretation None        Blanchie Dessert, MD 07/11/13 2336

## 2013-07-11 NOTE — ED Notes (Signed)
Pt c/o left ankle pain. Pt was stepping off bleachers and rolled her ankle. Pt presents with icebag wrapped around her ankle

## 2013-07-15 ENCOUNTER — Other Ambulatory Visit: Payer: Self-pay | Admitting: Orthopedic Surgery

## 2013-10-21 ENCOUNTER — Encounter (HOSPITAL_BASED_OUTPATIENT_CLINIC_OR_DEPARTMENT_OTHER): Payer: Self-pay | Admitting: *Deleted

## 2013-10-21 NOTE — Pre-Procedure Instructions (Addendum)
To come for BMET and EKG Pt. will call Dr. Osborne Casco for instructions on Insulin pump settings for NPO status prior to surgery.

## 2013-10-25 ENCOUNTER — Encounter (HOSPITAL_BASED_OUTPATIENT_CLINIC_OR_DEPARTMENT_OTHER)
Admission: RE | Admit: 2013-10-25 | Discharge: 2013-10-25 | Disposition: A | Discharge status: Home / Self Care | Payer: 59 | Source: Ambulatory Visit | Attending: Orthopedic Surgery | Admitting: Orthopedic Surgery

## 2013-10-25 DIAGNOSIS — G5600 Carpal tunnel syndrome, unspecified upper limb: Secondary | ICD-10-CM | POA: Diagnosis present

## 2013-10-25 DIAGNOSIS — E109 Type 1 diabetes mellitus without complications: Secondary | ICD-10-CM | POA: Diagnosis not present

## 2013-10-25 DIAGNOSIS — Z885 Allergy status to narcotic agent status: Secondary | ICD-10-CM | POA: Diagnosis not present

## 2013-10-25 DIAGNOSIS — Z794 Long term (current) use of insulin: Secondary | ICD-10-CM | POA: Diagnosis not present

## 2013-10-25 DIAGNOSIS — Z9641 Presence of insulin pump (external) (internal): Secondary | ICD-10-CM | POA: Diagnosis not present

## 2013-10-25 LAB — BASIC METABOLIC PANEL
Anion gap: 12 (ref 5–15)
BUN: 14 mg/dL (ref 6–23)
CALCIUM: 9.3 mg/dL (ref 8.4–10.5)
CO2: 27 mEq/L (ref 19–32)
Chloride: 100 mEq/L (ref 96–112)
Creatinine, Ser: 0.81 mg/dL (ref 0.50–1.10)
GFR calc Af Amer: >90 mL/min (ref >90)
GFR calc non Af Amer: >90 mL/min (ref >90)
GLUCOSE: 221 mg/dL — AB (ref 70–99)
Potassium: 4.6 mEq/L (ref 3.7–5.3)
Sodium: 139 mEq/L (ref 137–147)

## 2013-10-28 ENCOUNTER — Encounter (HOSPITAL_BASED_OUTPATIENT_CLINIC_OR_DEPARTMENT_OTHER)
Admission: RE | Disposition: A | Discharge status: Home / Self Care | Payer: Self-pay | Source: Ambulatory Visit | Attending: Orthopedic Surgery

## 2013-10-28 ENCOUNTER — Encounter (HOSPITAL_BASED_OUTPATIENT_CLINIC_OR_DEPARTMENT_OTHER): Payer: Self-pay | Admitting: Certified Registered"

## 2013-10-28 ENCOUNTER — Encounter (HOSPITAL_BASED_OUTPATIENT_CLINIC_OR_DEPARTMENT_OTHER): Payer: 59 | Admitting: Anesthesiology

## 2013-10-28 ENCOUNTER — Ambulatory Visit (HOSPITAL_BASED_OUTPATIENT_CLINIC_OR_DEPARTMENT_OTHER)
Admission: RE | Admit: 2013-10-28 | Discharge: 2013-10-28 | Disposition: A | Discharge status: Home / Self Care | Payer: 59 | Source: Ambulatory Visit | Attending: Orthopedic Surgery | Admitting: Orthopedic Surgery

## 2013-10-28 ENCOUNTER — Ambulatory Visit (HOSPITAL_BASED_OUTPATIENT_CLINIC_OR_DEPARTMENT_OTHER): Payer: 59 | Admitting: Anesthesiology

## 2013-10-28 DIAGNOSIS — E109 Type 1 diabetes mellitus without complications: Secondary | ICD-10-CM | POA: Insufficient documentation

## 2013-10-28 DIAGNOSIS — Z9641 Presence of insulin pump (external) (internal): Secondary | ICD-10-CM | POA: Insufficient documentation

## 2013-10-28 DIAGNOSIS — Z794 Long term (current) use of insulin: Secondary | ICD-10-CM | POA: Insufficient documentation

## 2013-10-28 DIAGNOSIS — G5600 Carpal tunnel syndrome, unspecified upper limb: Secondary | ICD-10-CM | POA: Insufficient documentation

## 2013-10-28 DIAGNOSIS — Z885 Allergy status to narcotic agent status: Secondary | ICD-10-CM | POA: Insufficient documentation

## 2013-10-28 HISTORY — DX: Presence of insulin pump (external) (internal): Z96.41

## 2013-10-28 HISTORY — PX: CARPAL TUNNEL RELEASE: SHX101

## 2013-10-28 HISTORY — DX: Type 1 diabetes mellitus without complications: E10.9

## 2013-10-28 LAB — GLUCOSE, CAPILLARY
GLUCOSE-CAPILLARY: 208 mg/dL — AB (ref 70–99)
Glucose-Capillary: 193 mg/dL — ABNORMAL HIGH (ref 70–99)

## 2013-10-28 LAB — POCT HEMOGLOBIN-HEMACUE: HEMOGLOBIN: 14.7 g/dL (ref 12.0–15.0)

## 2013-10-28 SURGERY — CARPAL TUNNEL RELEASE
Anesthesia: General | Site: Wrist | Laterality: Bilateral

## 2013-10-28 MED ORDER — FENTANYL CITRATE 0.05 MG/ML IJ SOLN
INTRAMUSCULAR | Status: DC | PRN
  Administered 2013-10-28: 100 ug via INTRAVENOUS
  Administered 2013-10-28 (×2): 50 ug via INTRAVENOUS

## 2013-10-28 MED ORDER — BUPIVACAINE HCL (PF) 0.25 % IJ SOLN
INTRAMUSCULAR | Status: AC
  Filled 2013-10-28: qty 30

## 2013-10-28 MED ORDER — CEFAZOLIN SODIUM-DEXTROSE 2-3 GM-% IV SOLR
2.0000 g | INTRAVENOUS | Status: AC
  Administered 2013-10-28: 2 g via INTRAVENOUS

## 2013-10-28 MED ORDER — HYDROCODONE-ACETAMINOPHEN 5-325 MG PO TABS
ORAL_TABLET | ORAL | Status: AC
  Filled 2013-10-28: qty 1

## 2013-10-28 MED ORDER — PROPOFOL 10 MG/ML IV BOLUS
INTRAVENOUS | Status: DC | PRN
  Administered 2013-10-28: 200 mg via INTRAVENOUS
  Administered 2013-10-28 (×2): 20 mg via INTRAVENOUS

## 2013-10-28 MED ORDER — CHLORHEXIDINE GLUCONATE 4 % EX LIQD: 60.0000 mL | Freq: Once | CUTANEOUS | Status: DC

## 2013-10-28 MED ORDER — CEFAZOLIN SODIUM-DEXTROSE 2-3 GM-% IV SOLR
INTRAVENOUS | Status: AC
  Filled 2013-10-28: qty 50

## 2013-10-28 MED ORDER — MIDAZOLAM HCL 2 MG/2ML IJ SOLN: 1.0000 mg | INTRAMUSCULAR | Status: DC | PRN

## 2013-10-28 MED ORDER — HYDROCODONE-ACETAMINOPHEN 5-325 MG PO TABS
1.0000 | ORAL_TABLET | Freq: Once | ORAL | Status: AC | PRN
  Administered 2013-10-28: 1 via ORAL

## 2013-10-28 MED ORDER — LIDOCAINE HCL (CARDIAC) 20 MG/ML IV SOLN
INTRAVENOUS | Status: DC | PRN
  Administered 2013-10-28: 60 mg via INTRAVENOUS

## 2013-10-28 MED ORDER — ONDANSETRON HCL 4 MG/2ML IJ SOLN
INTRAMUSCULAR | Status: DC | PRN
  Administered 2013-10-28: 4 mg via INTRAVENOUS

## 2013-10-28 MED ORDER — MIDAZOLAM HCL 2 MG/ML PO SYRP: 12.0000 mg | ORAL_SOLUTION | Freq: Once | ORAL | Status: DC | PRN

## 2013-10-28 MED ORDER — FENTANYL CITRATE 0.05 MG/ML IJ SOLN: 50.0000 ug | INTRAMUSCULAR | Status: DC | PRN

## 2013-10-28 MED ORDER — SCOPOLAMINE 1 MG/3DAYS TD PT72
MEDICATED_PATCH | TRANSDERMAL | Status: AC
  Filled 2013-10-28: qty 1

## 2013-10-28 MED ORDER — FENTANYL CITRATE 0.05 MG/ML IJ SOLN
INTRAMUSCULAR | Status: AC
  Filled 2013-10-28: qty 4

## 2013-10-28 MED ORDER — MIDAZOLAM HCL 5 MG/5ML IJ SOLN
INTRAMUSCULAR | Status: DC | PRN
  Administered 2013-10-28: 2 mg via INTRAVENOUS

## 2013-10-28 MED ORDER — LACTATED RINGERS IV SOLN
INTRAVENOUS | Status: DC
  Administered 2013-10-28 (×2): via INTRAVENOUS

## 2013-10-28 MED ORDER — MIDAZOLAM HCL 2 MG/2ML IJ SOLN
INTRAMUSCULAR | Status: AC
  Filled 2013-10-28: qty 2

## 2013-10-28 MED ORDER — HYDROCODONE-ACETAMINOPHEN 5-325 MG PO TABS: 1.0000 | ORAL_TABLET | Freq: Four times a day (QID) | ORAL | Status: DC | PRN

## 2013-10-28 MED ORDER — ONDANSETRON HCL 4 MG/2ML IJ SOLN: 4.0000 mg | Freq: Once | INTRAMUSCULAR | Status: DC | PRN

## 2013-10-28 MED ORDER — FENTANYL CITRATE 0.05 MG/ML IJ SOLN
25.0000 ug | INTRAMUSCULAR | Status: DC | PRN
  Administered 2013-10-28 (×2): 50 ug via INTRAVENOUS

## 2013-10-28 MED ORDER — FENTANYL CITRATE 0.05 MG/ML IJ SOLN
INTRAMUSCULAR | Status: AC
  Filled 2013-10-28: qty 2

## 2013-10-28 MED ORDER — SCOPOLAMINE 1 MG/3DAYS TD PT72
1.0000 | MEDICATED_PATCH | TRANSDERMAL | Status: DC
  Administered 2013-10-28: 1.5 mg via TRANSDERMAL

## 2013-10-28 MED ORDER — BUPIVACAINE HCL (PF) 0.25 % IJ SOLN
INTRAMUSCULAR | Status: DC | PRN
  Administered 2013-10-28: 4.5 mL
  Administered 2013-10-28: 5.5 mL

## 2013-10-28 SURGICAL SUPPLY — 42 items
BLADE SURG 15 STRL LF DISP TIS (BLADE) ×1 IMPLANT
BLADE SURG 15 STRL SS (BLADE) ×1
BNDG COHESIVE 3X5 TAN STRL LF (GAUZE/BANDAGES/DRESSINGS) ×4 IMPLANT
BNDG ESMARK 4X9 LF (GAUZE/BANDAGES/DRESSINGS) ×2 IMPLANT
BNDG GAUZE ELAST 4 BULKY (GAUZE/BANDAGES/DRESSINGS) ×4 IMPLANT
CHLORAPREP W/TINT 26ML (MISCELLANEOUS) ×4 IMPLANT
CORDS BIPOLAR (ELECTRODE) ×2 IMPLANT
COVER MAYO STAND STRL (DRAPES) ×4 IMPLANT
COVER TABLE BACK 60X90 (DRAPES) ×2 IMPLANT
CUFF TOURNIQUET SINGLE 18IN (TOURNIQUET CUFF) ×4 IMPLANT
DERMABOND ADVANCED (GAUZE/BANDAGES/DRESSINGS) ×2
DERMABOND ADVANCED .7 DNX12 (GAUZE/BANDAGES/DRESSINGS) ×2 IMPLANT
DRAPE EXTREMITY T 121X128X90 (DRAPE) ×4 IMPLANT
DRAPE SURG 17X23 STRL (DRAPES) ×4 IMPLANT
DRSG PAD ABDOMINAL 8X10 ST (GAUZE/BANDAGES/DRESSINGS) ×4 IMPLANT
GAUZE SPONGE 4X4 12PLY STRL (GAUZE/BANDAGES/DRESSINGS) ×2 IMPLANT
GAUZE XEROFORM 1X8 LF (GAUZE/BANDAGES/DRESSINGS) ×2 IMPLANT
GLOVE BIO SURGEON STRL SZ 6.5 (GLOVE) ×2 IMPLANT
GLOVE BIOGEL PI IND STRL 7.0 (GLOVE) ×2 IMPLANT
GLOVE BIOGEL PI IND STRL 8.5 (GLOVE) ×1 IMPLANT
GLOVE BIOGEL PI INDICATOR 7.0 (GLOVE) ×2
GLOVE BIOGEL PI INDICATOR 8.5 (GLOVE) ×1
GLOVE EXAM NITRILE MD LF STRL (GLOVE) ×2 IMPLANT
GLOVE SURG ORTHO 8.0 STRL STRW (GLOVE) ×2 IMPLANT
GOWN STRL REUS W/ TWL LRG LVL3 (GOWN DISPOSABLE) ×1 IMPLANT
GOWN STRL REUS W/TWL LRG LVL3 (GOWN DISPOSABLE) ×1
GOWN STRL REUS W/TWL XL LVL3 (GOWN DISPOSABLE) ×2 IMPLANT
NEEDLE 27GAX1X1/2 (NEEDLE) ×2 IMPLANT
NS IRRIG 1000ML POUR BTL (IV SOLUTION) ×2 IMPLANT
PACK BASIN DAY SURGERY FS (CUSTOM PROCEDURE TRAY) ×2 IMPLANT
PAD ALCOHOL SWAB (MISCELLANEOUS) ×4 IMPLANT
PAD CAST 3X4 CTTN HI CHSV (CAST SUPPLIES) ×2 IMPLANT
PADDING CAST ABS 4INX4YD NS (CAST SUPPLIES)
PADDING CAST ABS COTTON 4X4 ST (CAST SUPPLIES) IMPLANT
PADDING CAST COTTON 3X4 STRL (CAST SUPPLIES) ×2
STOCKINETTE 4X48 STRL (DRAPES) ×4 IMPLANT
SUT VICRYL 4-0 PS2 18IN ABS (SUTURE) IMPLANT
SUT VICRYL RAPIDE 4/0 PS 2 (SUTURE) ×2 IMPLANT
SYR BULB 3OZ (MISCELLANEOUS) ×2 IMPLANT
SYR CONTROL 10ML LL (SYRINGE) ×2 IMPLANT
TOWEL OR 17X24 6PK STRL BLUE (TOWEL DISPOSABLE) ×2 IMPLANT
UNDERPAD 30X30 INCONTINENT (UNDERPADS AND DIAPERS) ×4 IMPLANT

## 2013-10-28 NOTE — Brief Op Note (Signed)
10/28/2013  9:54 AM  PATIENT:  Stephanie Holden  38 y.o. female  PRE-OPERATIVE DIAGNOSIS:  BILATERAL CARPAL TUNNEL  POST-OPERATIVE DIAGNOSIS:  bilateral carpal tunnel syndrome  PROCEDURE:  Procedure(s): BILATERAL CARPAL TUNNEL RELEASE (Bilateral)  SURGEON:  Surgeon(s) and Role:    * Daryll Brod, MD - Primary  PHYSICIAN ASSISTANT:   ASSISTANTS: none   ANESTHESIA:   local and general  EBL:  Total I/O In: 1000 [I.V.:1000] Out: -   BLOOD ADMINISTERED:none  DRAINS: none   LOCAL MEDICATIONS USED:  BUPIVICAINE   SPECIMEN:  No Specimen  DISPOSITION OF SPECIMEN:  na  COUNTS:  YES  TOURNIQUET:   Total Tourniquet Time Documented: Forearm (Right) - 25 minutes Forearm (Right) - 20 minutes Total: Forearm (Right) - 45 minutes   DICTATION: .Other Dictation: Dictation Number [id number]  PLAN OF CARE: Discharge to home after PACU  PATIENT DISPOSITION:  PACU - hemodynamically stable.

## 2013-10-28 NOTE — Op Note (Signed)
NAME:  Stephanie Holden, Stephanie Holden                 ACCOUNT NO.:  [id number]  MEDICAL RECORD NO.:  [id number]  LOCATION:                                 FACILITY:  PHYSICIAN:  Daryll Brod, M.D.       DATE OF BIRTH:  [birth year]  DATE OF PROCEDURE:  10/28/2013 DATE OF DISCHARGE:  10/28/2013                              OPERATIVE REPORT   PREOPERATIVE DIAGNOSIS:  Bilateral carpal tunnel syndrome.  POSTOPERATIVE DIAGNOSIS:  Bilateral carpal tunnel syndrome.  OPERATION:  Decompression of median nerve, right and left wrist.  SURGEON:  Daryll Brod, M.D.  ASSISTANT:  None.  ANESTHESIA:  General with local infiltration.  ANESTHESIOLOGIST:  Lorrene Reid, M.D.  HISTORY:  The patient is a 38 year old female with a history of bilateral carpal tunnel syndrome, nerve conduction is positive and not responsive to conservative treatment.  She has elected to undergo surgical decompression to the median nerves bilateral.  Pre, peri, and postoperative course have been discussed along with risks and complications.  She is aware that there is no guarantee with the surgery; possibility of infection; recurrence of injury to arteries, nerves, tendons; incomplete relief of symptoms and dystrophy.  In the preoperative area, the patient is seen, the extremity marked by both patient and surgeon, and antibiotic given.  PROCEDURE IN DETAIL:  The patient was brought to the operating room, where a general anesthetic was carried out without difficulty.  She was prepped using ChloraPrep, supine position with the right arm free. First, the limb was exsanguinated with an Esmarch bandage.  Tourniquet placed on the forearm was inflated to 250 mmHg.  After time-out taken, ChloraPrep was used for prep and a 3-minute dry time was allowed. Longitudinal incision was then made in the right palm, carried down through the subcutaneous tissue.  Bleeders were electrocauterized with bipolar.  The palmar fascia was split.  Superficial palmar  arch identified.  Significant musculature was present from the ulnar side. This was incised along the hamate hook, taken care to preserve any possible neurovascular structures arising specifically the motor branch of the median nerve.  The ulnar nerve was protected throughout the procedure.  The carpal canal was opened.  The Sewall retractor and right- angle retractor were placed between skin and forearm fascia, and the fascia was released for approximately 2 cm proximal to the wrist crease under direct vision.  Canal was explored.  Motor branch entered into the muscle and the central aspect of the carpal canal was left intact.  The wound was copiously irrigated with saline and the skin closed with subcuticular 4-0 Vicryl Rapide sutures.  Local infiltration with 0.25% bupivacaine was then given.  The skin cleansed with alcohol and Dermabond applied.  A sterile compressive dressing with fingers free was applied.  On deflation of the tourniquet, all fingers were immediately pinked.  The tourniquet was removed.  The left side was then prepped and draped using ChloraPrep.  The limb exsanguinated with an Esmarch bandage.  Second time-out taken confirming the patient and procedure.  A longitudinal incision was then made in the left palm, carried down through the subcutaneous tissue.  Bleeders were again electrocauterized with bipolar with palmar fascia  was split.  Superficial palmar arch identified.  Again, musculature was present from the hypothenar eminence over to the thenar eminence.  This was incised along the thenar border along the hamate hook taking care to protect the underlying motor branch similar to the opposite side after placement of Sewall retractor and right-angle retractor and release of the forearm fascia.  The canal was explored.  The motor branch entered centrally into the carpal retinaculum, fascia and was left intact.  The wound was copiously irrigated with saline.  The  skin was then closed with subcuticular 4-0 Vicryl Rapide sutures after local infiltration with bupivacaine, approximately 12 total mL were used on both sides.  The skin was cleansed with alcohol and Dermabond applied.  A sterile compressive dressing was applied with the fingers free.  On deflation of the tourniquet to the left side, all fingers were immediately pinked.  She was taken to the recovery room for observation in satisfactory condition.  She will be discharged to home to return to the [facility] in 1 week, on Vicodin.          ______________________________ Daryll Brod, M.D.     GK/MEDQ  D:  10/28/2013  T:  10/28/2013  Job:  [id number]

## 2013-10-28 NOTE — Anesthesia Postprocedure Evaluation (Signed)
  Anesthesia Post-op Note  Patient: Stephanie Holden  Procedure(s) Performed: Procedure(s): BILATERAL CARPAL TUNNEL RELEASE (Bilateral)  Patient Location: PACU  Anesthesia Type: General   Level of Consciousness: awake, alert  and oriented  Airway and Oxygen Therapy: Patient Spontanous Breathing  Post-op Pain: mild  Post-op Assessment: Post-op Vital signs reviewed  Post-op Vital Signs: Reviewed  Last Vitals:  Filed Vitals:   10/28/13 1000  BP: 150/73  Pulse: 70  Temp:   Resp: 12    Complications: No apparent anesthesia complications

## 2013-10-28 NOTE — Anesthesia Procedure Notes (Signed)
Procedure Name: LMA Insertion Date/Time: 10/28/2013 8:43 AM Performed by: Baxter Flattery Pre-anesthesia Checklist: Patient identified, Emergency Drugs available, Suction available and Patient being monitored Patient Re-evaluated:Patient Re-evaluated prior to inductionOxygen Delivery Method: Circle System Utilized Preoxygenation: Pre-oxygenation with 100% oxygen Intubation Type: IV induction Ventilation: Mask ventilation without difficulty LMA: LMA inserted LMA Size: 3.0 Number of attempts: 1 Airway Equipment and Method: bite block Placement Confirmation: positive ETCO2 and breath sounds checked- equal and bilateral Tube secured with: Tape Dental Injury: Teeth and Oropharynx as per pre-operative assessment

## 2013-10-28 NOTE — Addendum Note (Signed)
Addendum created 10/28/13 1028 by Baxter Flattery, CRNA   Modules edited: Charges VN

## 2013-10-28 NOTE — Op Note (Signed)
Other Dictation: Dictation Number [id number]

## 2013-10-28 NOTE — Anesthesia Preprocedure Evaluation (Signed)
Anesthesia Evaluation  Patient identified by MRN, date of birth, ID band Patient awake    Reviewed: Allergy & Precautions, H&P , NPO status , Patient's Chart, lab work & pertinent test results  Airway Mallampati: I TM Distance: >3 FB Neck ROM: Full    Dental  (+) Teeth Intact, Dental Advisory Given   Pulmonary former smoker,  breath sounds clear to auscultation        Cardiovascular Rhythm:Regular Rate:Normal     Neuro/Psych    GI/Hepatic   Endo/Other  diabetes, Well Controlled, Type 1, Insulin Dependent  Renal/GU      Musculoskeletal   Abdominal   Peds  Hematology   Anesthesia Other Findings   Reproductive/Obstetrics                           Anesthesia Physical Anesthesia Plan  ASA: II  Anesthesia Plan: General   Post-op Pain Management:    Induction: Intravenous  Airway Management Planned: LMA  Additional Equipment:   Intra-op Plan:   Post-operative Plan: Extubation in OR  Informed Consent: I have reviewed the patients History and Physical, chart, labs and discussed the procedure including the risks, benefits and alternatives for the proposed anesthesia with the patient or authorized representative who has indicated his/her understanding and acceptance.   Dental advisory given  Plan Discussed with: CRNA, Anesthesiologist and Surgeon  Anesthesia Plan Comments:         Anesthesia Quick Evaluation

## 2013-10-28 NOTE — Transfer of Care (Signed)
Immediate Anesthesia Transfer of Care Note  Patient: Stephanie Holden  Procedure(s) Performed: Procedure(s): BILATERAL CARPAL TUNNEL RELEASE (Bilateral)  Patient Location: PACU  Anesthesia Type:General  Level of Consciousness: awake, sedated and responds to stimulation  Airway & Oxygen Therapy: Patient Spontanous Breathing and Patient connected to face mask oxygen  Post-op Assessment: Report given to PACU RN, Post -op Vital signs reviewed and stable and Patient moving all extremities  Post vital signs: Reviewed and stable  Complications: No apparent anesthesia complications
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
## 2013-10-28 NOTE — Discharge Instructions (Addendum)

## 2013-10-29 ENCOUNTER — Encounter (HOSPITAL_BASED_OUTPATIENT_CLINIC_OR_DEPARTMENT_OTHER): Payer: Self-pay | Admitting: Orthopedic Surgery

## 2013-12-08 ENCOUNTER — Encounter: Payer: Self-pay | Admitting: Gynecology

## 2013-12-08 ENCOUNTER — Ambulatory Visit (INDEPENDENT_AMBULATORY_CARE_PROVIDER_SITE_OTHER): Payer: 59 | Admitting: Gynecology

## 2013-12-08 VITALS — BP 120/68 | Resp 14 | Ht 62.0 in

## 2013-12-08 DIAGNOSIS — L259 Unspecified contact dermatitis, unspecified cause: Secondary | ICD-10-CM

## 2013-12-08 MED ORDER — METHYLPREDNISOLONE 4 MG PO KIT: PACK | ORAL | Status: DC

## 2013-12-08 NOTE — Progress Notes (Signed)
Subjective:     Patient ID: Stephanie Holden, female   DOB: [birth year], 38 y.o.   MRN: [id number]  HPI Comments: Pt here reporting a rash that began last night and has been spreading.  Pt is allergic to poison ivy and believes that her dog may had gotten into it.      Review of Systems  Skin: Positive for rash. Negative for color change and wound.  Allergic/Immunologic: Positive for environmental allergies.       Objective:   Physical Exam  Nursing note and vitals reviewed. Constitutional: She appears well-developed and well-nourished.  Skin: Rash noted. No bruising, no ecchymosis and no purpura noted. Rash is urticarial. Rash is not macular, not nodular, not pustular and not vesicular.          Assessment:     Contact dermatitis, Probable  Poison ivy    Plan:     Recommend treating skin, dog and clothes with tecnu Medrol dose pack-pt aware of affect on her sugars-will monitor and adjust insulin accordingly

## 2014-01-24 ENCOUNTER — Encounter: Payer: Self-pay | Admitting: Gynecology

## 2014-03-04 ENCOUNTER — Other Ambulatory Visit: Payer: Self-pay | Admitting: Orthopedic Surgery

## 2014-04-06 ENCOUNTER — Encounter (HOSPITAL_BASED_OUTPATIENT_CLINIC_OR_DEPARTMENT_OTHER): Payer: Self-pay | Admitting: *Deleted

## 2014-04-08 ENCOUNTER — Encounter (HOSPITAL_BASED_OUTPATIENT_CLINIC_OR_DEPARTMENT_OTHER)
Admission: RE | Admit: 2014-04-08 | Discharge: 2014-04-08 | Disposition: A | Discharge status: Home / Self Care | Payer: BLUE CROSS/BLUE SHIELD | Source: Ambulatory Visit | Attending: Orthopedic Surgery | Admitting: Orthopedic Surgery

## 2014-04-08 DIAGNOSIS — Z01818 Encounter for other preprocedural examination: Secondary | ICD-10-CM | POA: Insufficient documentation

## 2014-04-08 DIAGNOSIS — Z01812 Encounter for preprocedural laboratory examination: Secondary | ICD-10-CM | POA: Diagnosis not present

## 2014-04-08 DIAGNOSIS — M65842 Other synovitis and tenosynovitis, left hand: Secondary | ICD-10-CM | POA: Diagnosis not present

## 2014-04-08 LAB — BASIC METABOLIC PANEL
ANION GAP: 5 (ref 5–15)
BUN: 10 mg/dL (ref 6–23)
CHLORIDE: 103 meq/L (ref 96–112)
CO2: 26 mmol/L (ref 19–32)
Calcium: 8.9 mg/dL (ref 8.4–10.5)
Creatinine, Ser: 0.67 mg/dL (ref 0.50–1.10)
GFR calc Af Amer: >90 mL/min (ref >90)
GFR calc non Af Amer: >90 mL/min (ref >90)
Glucose, Bld: 183 mg/dL — ABNORMAL HIGH (ref 70–99)
POTASSIUM: 5 mmol/L (ref 3.5–5.1)
Sodium: 134 mmol/L — ABNORMAL LOW (ref 135–145)

## 2014-04-12 ENCOUNTER — Ambulatory Visit (HOSPITAL_BASED_OUTPATIENT_CLINIC_OR_DEPARTMENT_OTHER): Payer: BLUE CROSS/BLUE SHIELD | Admitting: Certified Registered"

## 2014-04-12 ENCOUNTER — Encounter (HOSPITAL_BASED_OUTPATIENT_CLINIC_OR_DEPARTMENT_OTHER)
Admission: RE | Disposition: A | Discharge status: Home / Self Care | Payer: Self-pay | Source: Ambulatory Visit | Attending: Orthopedic Surgery

## 2014-04-12 ENCOUNTER — Encounter (HOSPITAL_BASED_OUTPATIENT_CLINIC_OR_DEPARTMENT_OTHER): Payer: Self-pay | Admitting: Certified Registered"

## 2014-04-12 ENCOUNTER — Ambulatory Visit (HOSPITAL_BASED_OUTPATIENT_CLINIC_OR_DEPARTMENT_OTHER)
Admission: RE | Admit: 2014-04-12 | Discharge: 2014-04-12 | Disposition: A | Discharge status: Home / Self Care | Payer: BLUE CROSS/BLUE SHIELD | Source: Ambulatory Visit | Attending: Orthopedic Surgery | Admitting: Orthopedic Surgery

## 2014-04-12 DIAGNOSIS — E109 Type 1 diabetes mellitus without complications: Secondary | ICD-10-CM | POA: Insufficient documentation

## 2014-04-12 DIAGNOSIS — Z794 Long term (current) use of insulin: Secondary | ICD-10-CM | POA: Diagnosis not present

## 2014-04-12 DIAGNOSIS — Z888 Allergy status to other drugs, medicaments and biological substances status: Secondary | ICD-10-CM | POA: Insufficient documentation

## 2014-04-12 DIAGNOSIS — Z87891 Personal history of nicotine dependence: Secondary | ICD-10-CM | POA: Insufficient documentation

## 2014-04-12 DIAGNOSIS — M65312 Trigger thumb, left thumb: Secondary | ICD-10-CM | POA: Diagnosis not present

## 2014-04-12 HISTORY — PX: TRIGGER FINGER RELEASE: SHX641

## 2014-04-12 LAB — POCT HEMOGLOBIN-HEMACUE: HEMOGLOBIN: 16 g/dL — AB (ref 12.0–15.0)

## 2014-04-12 LAB — GLUCOSE, CAPILLARY
Glucose-Capillary: 197 mg/dL — ABNORMAL HIGH (ref 70–99)
Glucose-Capillary: 159 mg/dL — ABNORMAL HIGH (ref 70–99)

## 2014-04-12 SURGERY — RELEASE, A1 PULLEY, FOR TRIGGER FINGER
Anesthesia: Regional | Site: Thumb | Laterality: Left

## 2014-04-12 MED ORDER — 0.9 % SODIUM CHLORIDE (POUR BTL) OPTIME
TOPICAL | Status: DC | PRN
  Administered 2014-04-12: 120 mL

## 2014-04-12 MED ORDER — CEFAZOLIN SODIUM-DEXTROSE 2-3 GM-% IV SOLR
INTRAVENOUS | Status: AC
  Filled 2014-04-12: qty 50

## 2014-04-12 MED ORDER — MIDAZOLAM HCL 2 MG/2ML IJ SOLN
INTRAMUSCULAR | Status: AC
  Filled 2014-04-12: qty 2

## 2014-04-12 MED ORDER — ONDANSETRON HCL 4 MG/2ML IJ SOLN
INTRAMUSCULAR | Status: DC | PRN
  Administered 2014-04-12: 4 mg via INTRAVENOUS

## 2014-04-12 MED ORDER — LACTATED RINGERS IV SOLN
INTRAVENOUS | Status: DC
  Administered 2014-04-12 (×2): via INTRAVENOUS

## 2014-04-12 MED ORDER — BUPIVACAINE HCL (PF) 0.25 % IJ SOLN
INTRAMUSCULAR | Status: DC | PRN
  Administered 2014-04-12: 3 mL

## 2014-04-12 MED ORDER — CHLORHEXIDINE GLUCONATE 4 % EX LIQD: 60.0000 mL | Freq: Once | CUTANEOUS | Status: DC

## 2014-04-12 MED ORDER — MIDAZOLAM HCL 5 MG/5ML IJ SOLN
INTRAMUSCULAR | Status: DC | PRN
  Administered 2014-04-12: 2 mg via INTRAVENOUS

## 2014-04-12 MED ORDER — LIDOCAINE HCL (CARDIAC) 20 MG/ML IV SOLN
INTRAVENOUS | Status: DC | PRN
  Administered 2014-04-12: 30 mg via INTRAVENOUS

## 2014-04-12 MED ORDER — FENTANYL CITRATE 0.05 MG/ML IJ SOLN: 25.0000 ug | INTRAMUSCULAR | Status: DC | PRN

## 2014-04-12 MED ORDER — MIDAZOLAM HCL 2 MG/2ML IJ SOLN: 1.0000 mg | INTRAMUSCULAR | Status: DC | PRN

## 2014-04-12 MED ORDER — LIDOCAINE HCL (PF) 0.5 % IJ SOLN
INTRAMUSCULAR | Status: DC | PRN
  Administered 2014-04-12: 30 mL via INTRAVENOUS

## 2014-04-12 MED ORDER — FENTANYL CITRATE 0.05 MG/ML IJ SOLN
INTRAMUSCULAR | Status: DC | PRN
  Administered 2014-04-12: 50 ug via INTRAVENOUS

## 2014-04-12 MED ORDER — CEFAZOLIN SODIUM-DEXTROSE 2-3 GM-% IV SOLR: 2.0000 g | INTRAVENOUS | Status: DC

## 2014-04-12 MED ORDER — CEFAZOLIN SODIUM-DEXTROSE 2-3 GM-% IV SOLR
2.0000 g | INTRAVENOUS | Status: AC
  Administered 2014-04-12: 2 g via INTRAVENOUS

## 2014-04-12 MED ORDER — FENTANYL CITRATE 0.05 MG/ML IJ SOLN: 50.0000 ug | INTRAMUSCULAR | Status: DC | PRN

## 2014-04-12 MED ORDER — FENTANYL CITRATE 0.05 MG/ML IJ SOLN
INTRAMUSCULAR | Status: AC
  Filled 2014-04-12: qty 2

## 2014-04-12 MED ORDER — HYDROCODONE-ACETAMINOPHEN 5-325 MG PO TABS: 1.0000 | ORAL_TABLET | Freq: Four times a day (QID) | ORAL | Status: DC | PRN

## 2014-04-12 MED ORDER — PROPOFOL INFUSION 10 MG/ML OPTIME
INTRAVENOUS | Status: DC | PRN
  Administered 2014-04-12: 75 ug/kg/min via INTRAVENOUS

## 2014-04-12 MED ORDER — ONDANSETRON HCL 4 MG/2ML IJ SOLN: 4.0000 mg | Freq: Once | INTRAMUSCULAR | Status: DC | PRN

## 2014-04-12 MED ORDER — BUPIVACAINE HCL (PF) 0.25 % IJ SOLN
INTRAMUSCULAR | Status: AC
  Filled 2014-04-12: qty 30

## 2014-04-12 SURGICAL SUPPLY — 35 items
BANDAGE COBAN STERILE 2 (GAUZE/BANDAGES/DRESSINGS) ×2 IMPLANT
BLADE SURG 15 STRL LF DISP TIS (BLADE) ×1 IMPLANT
BLADE SURG 15 STRL SS (BLADE) ×1
BNDG ESMARK 4X9 LF (GAUZE/BANDAGES/DRESSINGS) ×2 IMPLANT
CHLORAPREP W/TINT 26ML (MISCELLANEOUS) ×2 IMPLANT
CORDS BIPOLAR (ELECTRODE) IMPLANT
COVER BACK TABLE 60X90IN (DRAPES) ×2 IMPLANT
COVER MAYO STAND STRL (DRAPES) ×2 IMPLANT
CUFF TOURNIQUET SINGLE 18IN (TOURNIQUET CUFF) ×2 IMPLANT
DECANTER SPIKE VIAL GLASS SM (MISCELLANEOUS) IMPLANT
DRAPE EXTREMITY T 121X128X90 (DRAPE) ×2 IMPLANT
DRAPE SURG 17X23 STRL (DRAPES) ×2 IMPLANT
GAUZE SPONGE 4X4 12PLY STRL (GAUZE/BANDAGES/DRESSINGS) ×2 IMPLANT
GAUZE XEROFORM 1X8 LF (GAUZE/BANDAGES/DRESSINGS) ×2 IMPLANT
GLOVE BIO SURGEON STRL SZ7.5 (GLOVE) ×2 IMPLANT
GLOVE BIOGEL PI IND STRL 7.0 (GLOVE) ×1 IMPLANT
GLOVE BIOGEL PI IND STRL 7.5 (GLOVE) ×1 IMPLANT
GLOVE BIOGEL PI IND STRL 8.5 (GLOVE) ×1 IMPLANT
GLOVE BIOGEL PI INDICATOR 7.0 (GLOVE) ×1
GLOVE BIOGEL PI INDICATOR 7.5 (GLOVE) ×1
GLOVE BIOGEL PI INDICATOR 8.5 (GLOVE) ×1
GLOVE ECLIPSE 6.5 STRL STRAW (GLOVE) ×2 IMPLANT
GLOVE SURG ORTHO 8.0 STRL STRW (GLOVE) ×2 IMPLANT
GOWN STRL REUS W/ TWL LRG LVL3 (GOWN DISPOSABLE) ×2 IMPLANT
GOWN STRL REUS W/TWL LRG LVL3 (GOWN DISPOSABLE) ×2
GOWN STRL REUS W/TWL XL LVL3 (GOWN DISPOSABLE) ×4 IMPLANT
NEEDLE PRECISIONGLIDE 27X1.5 (NEEDLE) ×2 IMPLANT
NS IRRIG 1000ML POUR BTL (IV SOLUTION) ×2 IMPLANT
PACK BASIN DAY SURGERY FS (CUSTOM PROCEDURE TRAY) ×2 IMPLANT
STOCKINETTE 4X48 STRL (DRAPES) ×2 IMPLANT
SUT ETHILON 5 0 PC 1 (SUTURE) ×2 IMPLANT
SYR BULB 3OZ (MISCELLANEOUS) ×2 IMPLANT
SYR CONTROL 10ML LL (SYRINGE) ×2 IMPLANT
TOWEL OR 17X24 6PK STRL BLUE (TOWEL DISPOSABLE) ×2 IMPLANT
UNDERPAD 30X30 INCONTINENT (UNDERPADS AND DIAPERS) ×2 IMPLANT

## 2014-04-12 NOTE — Anesthesia Procedure Notes (Addendum)
Anesthesia Regional Block:  Bier block (IV Regional)  Pre-Anesthetic Checklist: ,, timeout performed, Correct Patient, Correct Site, Correct Laterality, Correct Procedure,, site marked, surgical consent,, at surgeon's request Needles:  Injection technique: Single-shot  Needle Type: Other      Needle Gauge: 20 and 20 G    Additional Needles: Bier block (IV Regional) Narrative:   Performed by: Personally    Procedure Name: MAC Date/Time: 04/12/2014 9:45 AM Performed by: Winnie Umali Pre-anesthesia Checklist: Patient identified, Emergency Drugs available, Suction available, Patient being monitored and Timeout performed Patient Re-evaluated:Patient Re-evaluated prior to inductionOxygen Delivery Method: Simple face mask
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
## 2014-04-12 NOTE — Transfer of Care (Signed)
Immediate Anesthesia Transfer of Care Note  Patient: Stephanie Holden  Procedure(s) Performed: Procedure(s) with comments: RELEASE A-1 PULLEY LEFT THUMB (Left) - ANESTHESIA:  GENERAL/IV REGIONAL FAB  Patient Location: PACU  Anesthesia Type:Bier block  Level of Consciousness: awake, alert , oriented and patient cooperative  Airway & Oxygen Therapy: Patient Spontanous Breathing and Patient connected to face mask oxygen  Post-op Assessment: Report given to PACU RN and Post -op Vital signs reviewed and stable  Post vital signs: Reviewed and stable  Complications: No apparent anesthesia complications

## 2014-04-12 NOTE — Op Note (Signed)
Dictation Number [id number]

## 2014-04-12 NOTE — Brief Op Note (Signed)
04/12/2014  11:15 AM  PATIENT:  Stephanie Holden  39 y.o. female  PRE-OPERATIVE DIAGNOSIS:  STENOSING TENOSYNOVITIS LEFT THUMB  POST-OPERATIVE DIAGNOSIS:  STENOSING TENOSYNOVITIS LEFT THUMB  PROCEDURE:  Procedure(s) with comments: RELEASE A-1 PULLEY LEFT THUMB (Left) - ANESTHESIA:  GENERAL/IV REGIONAL FAB  SURGEON:  Surgeon(s) and Role:    * Daryll Brod, MD - Primary  PHYSICIAN ASSISTANT:   ASSISTANTS: none   ANESTHESIA:   local and regional  EBL:     BLOOD ADMINISTERED:none  DRAINS: none   LOCAL MEDICATIONS USED:  BUPIVICAINE   SPECIMEN:  No Specimen  DISPOSITION OF SPECIMEN:  N/A  COUNTS:  YES  TOURNIQUET:   Total Tourniquet Time Documented: Forearm (Left) - 17 minutes Total: Forearm (Left) - 17 minutes   DICTATION: .Other Dictation: Dictation Number [id number]  PLAN OF CARE: Discharge to home after PACU  PATIENT DISPOSITION:  PACU - hemodynamically stable.

## 2014-04-12 NOTE — Anesthesia Preprocedure Evaluation (Addendum)
Anesthesia Evaluation  Patient identified by MRN, date of birth, ID band Patient awake    Reviewed: Allergy & Precautions, NPO status   Airway Mallampati: I  TM Distance: >3 FB Neck ROM: Full    Dental  (+) Teeth Intact, Dental Advisory Given   Pulmonary former smoker,  breath sounds clear to auscultation        Cardiovascular Rhythm:Regular Rate:Normal     Neuro/Psych    GI/Hepatic   Endo/Other  diabetes, Well Controlled, Type 1, Insulin Dependent  Renal/GU      Musculoskeletal   Abdominal   Peds  Hematology   Anesthesia Other Findings   Reproductive/Obstetrics                           Anesthesia Physical Anesthesia Plan  ASA: II  Anesthesia Plan: Bier Block   Post-op Pain Management:    Induction: Intravenous  Airway Management Planned: Simple Face Mask  Additional Equipment:   Intra-op Plan:   Post-operative Plan:   Informed Consent: I have reviewed the patients History and Physical, chart, labs and discussed the procedure including the risks, benefits and alternatives for the proposed anesthesia with the patient or authorized representative who has indicated his/her understanding and acceptance.   Dental advisory given  Plan Discussed with: CRNA, Anesthesiologist and Surgeon  Anesthesia Plan Comments:         Anesthesia Quick Evaluation

## 2014-04-12 NOTE — Anesthesia Postprocedure Evaluation (Signed)
  Anesthesia Post-op Note  Patient: Stephanie Holden  Procedure(s) Performed: Procedure(s): RELEASE A-1 PULLEY LEFT THUMB (Left)  Patient Location: PACU  Anesthesia Type: Bier Block   Level of Consciousness: awake, alert  and oriented  Airway and Oxygen Therapy: Patient Spontanous Breathing  Post-op Pain: mild  Post-op Assessment: Post-op Vital signs reviewed  Post-op Vital Signs: Reviewed  Last Vitals:  Filed Vitals:   04/12/14 1230  BP: 127/72  Pulse: 68  Temp: 36.4 C  Resp: 20    Complications: No apparent anesthesia complications

## 2014-04-12 NOTE — Discharge Instructions (Addendum)
[facility] Instructions °Hand Surgery ° °Wound Care: °Keep your hand elevated above the level of your heart.  Do not allow it to dangle by your side.  Keep the dressing dry and do not remove it unless your doctor advises you to do so.  He will usually change it at the time of your post-op visit.  Moving your fingers is advised to stimulate circulation but will depend on the site of your surgery.  If you have a splint applied, your doctor will advise you regarding movement. ° °Activity: °Do not drive or operate machinery today.  Rest today and then you may return to your normal activity and work as indicated by your physician. ° °Diet:  °Drink liquids today or eat a light diet.  You may resume a regular diet tomorrow.   ° °General expectations: °Pain for two to three days. °Fingers may become slightly swollen. ° °Call your doctor if any of the following occur: °Severe pain not relieved by pain medication. °Elevated temperature. °Dressing soaked with blood. °Inability to move fingers. °White or bluish color to fingers. ° ° ° °Post Anesthesia Home Care Instructions ° °Activity: °Get plenty of rest for the remainder of the day. A responsible adult should stay with you for 24 hours following the procedure.  °For the next 24 hours, DO NOT: °-Drive a car °-Operate machinery °-Drink alcoholic beverages °-Take any medication unless instructed by your physician °-Make any legal decisions or sign important papers. ° °Meals: °Start with liquid foods such as gelatin or soup. Progress to regular foods as tolerated. Avoid greasy, spicy, heavy foods. If nausea and/or vomiting occur, drink only clear liquids until the nausea and/or vomiting subsides. Call your physician if vomiting continues. ° °Special Instructions/Symptoms: °Your throat may feel dry or sore from the anesthesia or the breathing tube placed in your throat during surgery. If this causes discomfort, gargle with warm salt water. The discomfort should disappear within  24 hours. ° °Call your surgeon if you experience:  ° °1.  Fever over 101.0. °2.  Inability to urinate. °3.  Nausea and/or vomiting. °4.  Extreme swelling or bruising at the surgical site. °5.  Continued bleeding from the incision. °6.  Increased pain, redness or drainage from the incision. °7.  Problems related to your pain medication. °8. Any change in color, movement and/or sensation °9. Any problems and/or concerns ° ° ° °

## 2014-04-13 ENCOUNTER — Encounter (HOSPITAL_BASED_OUTPATIENT_CLINIC_OR_DEPARTMENT_OTHER): Payer: Self-pay | Admitting: Orthopedic Surgery

## 2014-04-15 NOTE — Op Note (Signed)
NAME:  Stephanie Holden, Stephanie Holden NO.:  [id number]  MEDICAL RECORD NO.:  [id number]  LOCATION:                                 FACILITY:  PHYSICIAN:  Daryll Brod, M.D.            DATE OF BIRTH:  DATE OF PROCEDURE:  04/12/2014 DATE OF DISCHARGE:                              OPERATIVE REPORT   PREOPERATIVE DIAGNOSIS:  Stenosing tenosynovitis, left thumb.  POSTOPERATIVE DIAGNOSIS:  Stenosing tenosynovitis, left thumb.  OPERATION:  Release A1 pulley, left thumb.  SURGEON:  Daryll Brod, MD.  ANESTHESIA:  Forearm-based IV regional with local infiltration.  ANESTHESIOLOGIST:  Dr. Al Corpus.  HISTORY:  The patient is a 39 year old female, status post bilateral carpal tunnels.  She has developed triggering of both thumbs, where her left thumb is locked.  She has a history of diabetes, has elected to have this surgically released.  Pre, peri, postoperative course have been discussed along with risks and complications.  She is aware that there is no guarantee with the surgery, possibility of infection, recurrence of injury to arteries, nerves, tendons, incomplete relief of symptoms, dystrophy.  In the preoperative area, the patient is seen, the extremity marked by both patient and surgeon.  Antibiotic given.  PROCEDURE IN DETAIL:  The patient was brought to the operating room, where a forearm-based IV regional anesthetic was carried out without difficulty.  She was prepped using ChloraPrep, supine position with the left arm free.  A 3-minute dry time was allowed.  Time-out taken, confirming the patient and procedure.  A transverse incision was made over the metacarpophalangeal joint crease of the left thumb, carried down through subcutaneous tissue.  Bleeders were electrocauterized with bipolar.  Neurovascular structures identified radially and ulnarly.  The A1 pulley was found to be thickened.  A nodule was present in the flexor pollicis longus tendon.  The A1 pulley was  released on its radial aspect.  The finger placed through a full range motion.  No further triggering was noted.  The wound was copiously irrigated with saline and the skin closed with interrupted 5-0 nylon sutures.  A sterile compressive dressing was applied with the thumb free.  After injection of the area with 0.25% bupivacaine without epinephrine, approximately 4 mL was used.  On deflation of the tourniquet, all fingers immediately pinked.  She was taken to the recovery room for observation in satisfactory condition.  She will be discharged home to return to the [facility] in 1 week, on Vicodin.          ______________________________ Daryll Brod, M.D.     GK/MEDQ  D:  04/12/2014  T:  04/12/2014  Job:  [id number]

## 2014-06-24 DIAGNOSIS — M65319 Trigger thumb, unspecified thumb: Secondary | ICD-10-CM

## 2014-06-24 HISTORY — DX: Trigger thumb, unspecified thumb: M65.319

## 2014-07-06 ENCOUNTER — Other Ambulatory Visit: Payer: Self-pay | Admitting: Orthopedic Surgery

## 2014-07-12 ENCOUNTER — Encounter (HOSPITAL_BASED_OUTPATIENT_CLINIC_OR_DEPARTMENT_OTHER): Payer: Self-pay | Admitting: *Deleted

## 2014-07-14 ENCOUNTER — Encounter (HOSPITAL_BASED_OUTPATIENT_CLINIC_OR_DEPARTMENT_OTHER): Payer: Self-pay | Admitting: *Deleted

## 2014-07-14 NOTE — Pre-Procedure Instructions (Signed)
To come for BMET 

## 2014-07-19 ENCOUNTER — Ambulatory Visit (HOSPITAL_BASED_OUTPATIENT_CLINIC_OR_DEPARTMENT_OTHER): Payer: BLUE CROSS/BLUE SHIELD | Admitting: Certified Registered"

## 2014-07-19 ENCOUNTER — Encounter (HOSPITAL_BASED_OUTPATIENT_CLINIC_OR_DEPARTMENT_OTHER): Payer: Self-pay | Admitting: Certified Registered"

## 2014-07-19 ENCOUNTER — Ambulatory Visit (HOSPITAL_BASED_OUTPATIENT_CLINIC_OR_DEPARTMENT_OTHER)
Admission: RE | Admit: 2014-07-19 | Discharge: 2014-07-19 | Disposition: A | Discharge status: Home / Self Care | Payer: BLUE CROSS/BLUE SHIELD | Source: Ambulatory Visit | Attending: Orthopedic Surgery | Admitting: Orthopedic Surgery

## 2014-07-19 ENCOUNTER — Encounter (HOSPITAL_BASED_OUTPATIENT_CLINIC_OR_DEPARTMENT_OTHER)
Admission: RE | Admit: 2014-07-19 | Discharge: 2014-07-19 | Disposition: A | Discharge status: Home / Self Care | Payer: BLUE CROSS/BLUE SHIELD | Source: Ambulatory Visit | Attending: Orthopedic Surgery | Admitting: Orthopedic Surgery

## 2014-07-19 ENCOUNTER — Encounter (HOSPITAL_BASED_OUTPATIENT_CLINIC_OR_DEPARTMENT_OTHER)
Admission: RE | Disposition: A | Discharge status: Home / Self Care | Payer: Self-pay | Source: Ambulatory Visit | Attending: Orthopedic Surgery

## 2014-07-19 DIAGNOSIS — M65311 Trigger thumb, right thumb: Secondary | ICD-10-CM | POA: Diagnosis present

## 2014-07-19 DIAGNOSIS — E109 Type 1 diabetes mellitus without complications: Secondary | ICD-10-CM | POA: Insufficient documentation

## 2014-07-19 DIAGNOSIS — Z87891 Personal history of nicotine dependence: Secondary | ICD-10-CM | POA: Insufficient documentation

## 2014-07-19 DIAGNOSIS — Z794 Long term (current) use of insulin: Secondary | ICD-10-CM | POA: Insufficient documentation

## 2014-07-19 DIAGNOSIS — Z9641 Presence of insulin pump (external) (internal): Secondary | ICD-10-CM | POA: Insufficient documentation

## 2014-07-19 DIAGNOSIS — Z79899 Other long term (current) drug therapy: Secondary | ICD-10-CM | POA: Diagnosis not present

## 2014-07-19 DIAGNOSIS — Z9889 Other specified postprocedural states: Secondary | ICD-10-CM | POA: Insufficient documentation

## 2014-07-19 HISTORY — PX: TRIGGER FINGER RELEASE: SHX641

## 2014-07-19 HISTORY — DX: Trigger thumb, unspecified thumb: M65.319

## 2014-07-19 LAB — BASIC METABOLIC PANEL
Anion gap: 7 (ref 5–15)
BUN: 10 mg/dL (ref 6–23)
CALCIUM: 9.1 mg/dL (ref 8.4–10.5)
CO2: 27 mmol/L (ref 19–32)
Chloride: 104 mmol/L (ref 96–112)
Creatinine, Ser: 0.82 mg/dL (ref 0.50–1.10)
GFR calc Af Amer: >90 mL/min (ref >90)
GFR calc non Af Amer: >90 mL/min (ref >90)
Glucose, Bld: 257 mg/dL — ABNORMAL HIGH (ref 70–99)
Potassium: 5.2 mmol/L — ABNORMAL HIGH (ref 3.5–5.1)
SODIUM: 138 mmol/L (ref 135–145)

## 2014-07-19 LAB — POCT HEMOGLOBIN-HEMACUE: HEMOGLOBIN: 14.1 g/dL (ref 12.0–15.0)

## 2014-07-19 LAB — GLUCOSE, CAPILLARY
GLUCOSE-CAPILLARY: 169 mg/dL — AB (ref 70–99)
Glucose-Capillary: 161 mg/dL — ABNORMAL HIGH (ref 70–99)

## 2014-07-19 SURGERY — RELEASE, A1 PULLEY, FOR TRIGGER FINGER
Anesthesia: Monitor Anesthesia Care | Site: Thumb | Laterality: Right

## 2014-07-19 MED ORDER — OXYCODONE HCL 5 MG PO TABS: 5.0000 mg | ORAL_TABLET | Freq: Once | ORAL | Status: DC | PRN

## 2014-07-19 MED ORDER — CHLORHEXIDINE GLUCONATE 4 % EX LIQD: 60.0000 mL | Freq: Once | CUTANEOUS | Status: DC

## 2014-07-19 MED ORDER — FENTANYL CITRATE (PF) 100 MCG/2ML IJ SOLN
INTRAMUSCULAR | Status: AC
  Filled 2014-07-19: qty 2

## 2014-07-19 MED ORDER — CEFAZOLIN SODIUM-DEXTROSE 2-3 GM-% IV SOLR: 2.0000 g | INTRAVENOUS | Status: DC

## 2014-07-19 MED ORDER — MIDAZOLAM HCL 2 MG/ML PO SYRP: 12.0000 mg | ORAL_SOLUTION | Freq: Once | ORAL | Status: DC | PRN

## 2014-07-19 MED ORDER — GLYCOPYRROLATE 0.2 MG/ML IJ SOLN: 0.2000 mg | Freq: Once | INTRAMUSCULAR | Status: DC | PRN

## 2014-07-19 MED ORDER — MIDAZOLAM HCL 2 MG/2ML IJ SOLN
INTRAMUSCULAR | Status: AC
  Filled 2014-07-19: qty 2

## 2014-07-19 MED ORDER — CEFAZOLIN SODIUM-DEXTROSE 2-3 GM-% IV SOLR
INTRAVENOUS | Status: AC
  Filled 2014-07-19: qty 50

## 2014-07-19 MED ORDER — LACTATED RINGERS IV SOLN
INTRAVENOUS | Status: DC
  Administered 2014-07-19: 10:00:00 via INTRAVENOUS

## 2014-07-19 MED ORDER — FENTANYL CITRATE (PF) 100 MCG/2ML IJ SOLN: 25.0000 ug | INTRAMUSCULAR | Status: DC | PRN

## 2014-07-19 MED ORDER — FENTANYL CITRATE (PF) 100 MCG/2ML IJ SOLN
INTRAMUSCULAR | Status: DC | PRN
  Administered 2014-07-19: 50 ug via INTRAVENOUS

## 2014-07-19 MED ORDER — LIDOCAINE HCL (CARDIAC) 20 MG/ML IV SOLN
INTRAVENOUS | Status: DC | PRN
  Administered 2014-07-19: 20 mg via INTRAVENOUS

## 2014-07-19 MED ORDER — CHLORHEXIDINE GLUCONATE 4 % EX LIQD
60.0000 mL | Freq: Once | CUTANEOUS | Status: DC
Start: 2014-07-19 — End: 2014-07-19

## 2014-07-19 MED ORDER — CEFAZOLIN SODIUM-DEXTROSE 2-3 GM-% IV SOLR
2.0000 g | INTRAVENOUS | Status: AC
  Administered 2014-07-19: 2 g via INTRAVENOUS

## 2014-07-19 MED ORDER — HYDROCODONE-ACETAMINOPHEN 5-325 MG PO TABS: 1.0000 | ORAL_TABLET | Freq: Four times a day (QID) | ORAL | Status: DC | PRN

## 2014-07-19 MED ORDER — PROPOFOL INFUSION 10 MG/ML OPTIME
INTRAVENOUS | Status: DC | PRN
  Administered 2014-07-19: 75 ug/kg/min via INTRAVENOUS

## 2014-07-19 MED ORDER — OXYCODONE HCL 5 MG/5ML PO SOLN: 5.0000 mg | Freq: Once | ORAL | Status: DC | PRN

## 2014-07-19 MED ORDER — FENTANYL CITRATE (PF) 100 MCG/2ML IJ SOLN: 50.0000 ug | INTRAMUSCULAR | Status: DC | PRN

## 2014-07-19 MED ORDER — PROMETHAZINE HCL 25 MG/ML IJ SOLN: 6.2500 mg | INTRAMUSCULAR | Status: DC | PRN

## 2014-07-19 MED ORDER — BUPIVACAINE HCL (PF) 0.25 % IJ SOLN
INTRAMUSCULAR | Status: DC | PRN
  Administered 2014-07-19: 6 mL

## 2014-07-19 MED ORDER — 0.9 % SODIUM CHLORIDE (POUR BTL) OPTIME
TOPICAL | Status: DC | PRN
  Administered 2014-07-19: 200 mL

## 2014-07-19 MED ORDER — ONDANSETRON HCL 4 MG/2ML IJ SOLN
INTRAMUSCULAR | Status: DC | PRN
  Administered 2014-07-19: 4 mg via INTRAVENOUS

## 2014-07-19 MED ORDER — MIDAZOLAM HCL 2 MG/2ML IJ SOLN
1.0000 mg | INTRAMUSCULAR | Status: DC | PRN
  Administered 2014-07-19: 2 mg via INTRAVENOUS

## 2014-07-19 MED ORDER — LIDOCAINE HCL (PF) 0.5 % IJ SOLN
INTRAMUSCULAR | Status: DC | PRN
  Administered 2014-07-19: 30 mL via INTRAVENOUS

## 2014-07-19 MED ORDER — BUPIVACAINE HCL (PF) 0.25 % IJ SOLN
INTRAMUSCULAR | Status: AC
  Filled 2014-07-19: qty 30

## 2014-07-19 SURGICAL SUPPLY — 33 items
BANDAGE COBAN STERILE 2 (GAUZE/BANDAGES/DRESSINGS) ×2 IMPLANT
BLADE SURG 15 STRL LF DISP TIS (BLADE) ×1 IMPLANT
BLADE SURG 15 STRL SS (BLADE) ×1
BNDG ESMARK 4X9 LF (GAUZE/BANDAGES/DRESSINGS) ×2 IMPLANT
CHLORAPREP W/TINT 26ML (MISCELLANEOUS) ×2 IMPLANT
CORDS BIPOLAR (ELECTRODE) IMPLANT
COVER BACK TABLE 60X90IN (DRAPES) ×2 IMPLANT
COVER MAYO STAND STRL (DRAPES) ×2 IMPLANT
CUFF TOURNIQUET SINGLE 18IN (TOURNIQUET CUFF) ×2 IMPLANT
DECANTER SPIKE VIAL GLASS SM (MISCELLANEOUS) IMPLANT
DRAPE EXTREMITY T 121X128X90 (DRAPE) ×2 IMPLANT
DRAPE SURG 17X23 STRL (DRAPES) ×2 IMPLANT
GAUZE SPONGE 4X4 12PLY STRL (GAUZE/BANDAGES/DRESSINGS) ×2 IMPLANT
GAUZE XEROFORM 1X8 LF (GAUZE/BANDAGES/DRESSINGS) ×2 IMPLANT
GLOVE BIO SURGEON STRL SZ 6.5 (GLOVE) ×2 IMPLANT
GLOVE BIOGEL PI IND STRL 7.0 (GLOVE) ×1 IMPLANT
GLOVE BIOGEL PI IND STRL 8.5 (GLOVE) ×1 IMPLANT
GLOVE BIOGEL PI INDICATOR 7.0 (GLOVE) ×1
GLOVE BIOGEL PI INDICATOR 8.5 (GLOVE) ×1
GLOVE EXAM NITRILE LRG STRL (GLOVE) ×2 IMPLANT
GLOVE SURG ORTHO 8.0 STRL STRW (GLOVE) ×2 IMPLANT
GOWN STRL REUS W/ TWL LRG LVL3 (GOWN DISPOSABLE) ×1 IMPLANT
GOWN STRL REUS W/TWL LRG LVL3 (GOWN DISPOSABLE) ×1
GOWN STRL REUS W/TWL XL LVL3 (GOWN DISPOSABLE) ×2 IMPLANT
NEEDLE PRECISIONGLIDE 27X1.5 (NEEDLE) ×2 IMPLANT
NS IRRIG 1000ML POUR BTL (IV SOLUTION) ×2 IMPLANT
PACK BASIN DAY SURGERY FS (CUSTOM PROCEDURE TRAY) ×2 IMPLANT
STOCKINETTE 4X48 STRL (DRAPES) ×2 IMPLANT
SUT ETHILON 4 0 PS 2 18 (SUTURE) ×2 IMPLANT
SYR BULB 3OZ (MISCELLANEOUS) ×2 IMPLANT
SYR CONTROL 10ML LL (SYRINGE) ×2 IMPLANT
TOWEL OR 17X24 6PK STRL BLUE (TOWEL DISPOSABLE) ×4 IMPLANT
UNDERPAD 30X30 INCONTINENT (UNDERPADS AND DIAPERS) ×2 IMPLANT

## 2014-07-19 NOTE — Anesthesia Postprocedure Evaluation (Signed)
  Anesthesia Post-op Note  Patient: Stephanie Holden  Procedure(s) Performed: Procedure(s): RELEASE TRIGGER FINGER/A-1 PULLEY RIGHT THUMB (Right)  Patient Location: PACU  Anesthesia Type:MAC and Bier block  Level of Consciousness: awake and alert   Airway and Oxygen Therapy: Patient Spontanous Breathing  Post-op Pain: none  Post-op Assessment: Post-op Vital signs reviewed  Post-op Vital Signs: Reviewed  Last Vitals:  Filed Vitals:   07/19/14 1122  BP: 132/78  Pulse: 57  Temp: 36.7 C  Resp: 18    Complications: No apparent anesthesia complications

## 2014-07-19 NOTE — Brief Op Note (Signed)
07/19/2014  10:38 AM  PATIENT:  Stephanie Holden  39 y.o. female  PRE-OPERATIVE DIAGNOSIS:  Chronic stenosing tenosynovitis right thumb  M65.311  POST-OPERATIVE DIAGNOSIS:  Chronic stenosing tenosynovitis right thumb  M65.311  PROCEDURE:  Procedure(s): RELEASE TRIGGER FINGER/A-1 PULLEY RIGHT THUMB (Right)  SURGEON:  Surgeon(s) and Role:    * Daryll Brod, MD - Primary  PHYSICIAN ASSISTANT:   ASSISTANTS: none   ANESTHESIA:   local and regional  EBL:  Total I/O In: 800 [I.V.:800] Out: -   BLOOD ADMINISTERED:none  DRAINS: none   LOCAL MEDICATIONS USED:  BUPIVICAINE   SPECIMEN:  No Specimen  DISPOSITION OF SPECIMEN:  N/A  COUNTS:  YES  TOURNIQUET:   Total Tourniquet Time Documented: Forearm (Right) - 15 minutes Total: Forearm (Right) - 15 minutes   DICTATION: .Other Dictation: Dictation Number [id number]  PLAN OF CARE: Discharge to home after PACU  PATIENT DISPOSITION:  PACU - hemodynamically stable.

## 2014-07-19 NOTE — Transfer of Care (Signed)
Immediate Anesthesia Transfer of Care Note  Patient: Stephanie Holden  Procedure(s) Performed: Procedure(s): RELEASE TRIGGER FINGER/A-1 PULLEY RIGHT THUMB (Right)  Patient Location: PACU  Anesthesia Type:Bier block  Level of Consciousness: awake, alert , oriented and patient cooperative  Airway & Oxygen Therapy: Patient Spontanous Breathing and Patient connected to face mask oxygen  Post-op Assessment: Report given to RN and Post -op Vital signs reviewed and stable  Post vital signs: Reviewed and stable  Last Vitals:  Filed Vitals:   07/19/14 0927  BP: 148/97  Pulse: 60  Temp: 36.6 C  Resp: 20    Complications: No apparent anesthesia complications

## 2014-07-19 NOTE — Anesthesia Procedure Notes (Signed)
Anesthesia Regional Block:  Bier block (IV Regional)  Pre-Anesthetic Checklist: ,, timeout performed, Correct Patient, Correct Site, Correct Laterality, Correct Procedure,, site marked, surgical consent,, at surgeon's request Needles:  Injection technique: Single-shot  Needle Type: Other      Needle Gauge: 20 and 20 G    Additional Needles: Bier block (IV Regional) Narrative:   Performed by: Personally    Procedure Name: MAC Date/Time: 07/19/2014 10:00 AM Performed by: Melessia Kaus D Pre-anesthesia Checklist: Patient identified, Emergency Drugs available, Suction available, Patient being monitored and Timeout performed Patient Re-evaluated:Patient Re-evaluated prior to inductionOxygen Delivery Method: Simple face mask

## 2014-07-19 NOTE — Op Note (Signed)
Dictation Number [id number]

## 2014-07-19 NOTE — H&P (Signed)
  Stephanie Holden is a 39 year old right hand dominant female who has been seen over the past year for bilateral carpal tunnel syndrome. She has undergone bilateral carpal tunnel releases in August. She has done extremely well following this but has developed triggering of her thumbs bilaterally. The left has been going on for several weeks and the right is just beginning. She recalls no specific history of injury. The left thumb is locked at the present time. The right thumb is problematic in the morning. She has not had any treatment for this.   PAST MEDICAL HISTORY:  She has no drug allergies. She is on Humalog, Benazepril. She has had C-sections, right wrist surgery and carpal tunnel releases.  FAMILY MEDICAL HISTORY: Positive for diabetes, high BP and arthritis.  SOCIAL HISTORY:  She does not smoke. She drinks socially. She is married and a phlebotomist.   REVIEW OF SYSTEMS: Positive for contacts otherwise negative 14 points.  Stephanie Holden is an 39 y.o. female.   Chief Complaint: trigger right thumb HPI: see above  Past Medical History  Diagnosis Date  . Insulin pump in place   . Stenosing tenosynovitis of thumb 06/2014    right  . Insulin dependent type 1 diabetes mellitus     Past Surgical History  Procedure Laterality Date  . Cesarean section  2000; 08/20/2001  . Essure tubal ligation  2005    failed, tubal puncture  . Laparoscopic unilateral salpingectomy  2005  . Laparoscopic tubal ligation  04/02/2007    removal Essure  . Carpal tunnel release Bilateral 10/28/2013    Procedure: BILATERAL CARPAL TUNNEL RELEASE;  Surgeon: Daryll Brod, MD;  Location: [facility];  Service: Orthopedics;  Laterality: Bilateral;  . Trigger finger release Left 04/12/2014    Procedure: RELEASE A-1 PULLEY LEFT THUMB;  Surgeon: Daryll Brod, MD;  Location: [facility];  Service: Orthopedics;  Laterality: Left;    History reviewed. No pertinent family history. Social History:   reports that she quit smoking about 13 years ago. She has never used smokeless tobacco. She reports that she drinks alcohol. She reports that she does not use illicit drugs.  Allergies:  Allergies  Allergen Reactions  . Dilaudid [Hydromorphone Hcl] Itching and Other (See Comments)    UNCONTROLLABLE CRYING    No prescriptions prior to admission    No results found for this or any previous visit (from the past 48 hour(s)).  No results found.   Pertinent items are noted in HPI.  Height 5\' 2"  (1.575 m), weight 71.668 kg (158 lb), last menstrual period 06/18/2014.  General appearance: alert, cooperative and appears stated age Head: Normocephalic, without obvious abnormality Neck: no JVD Resp: clear to auscultation bilaterally Cardio: regular rate and rhythm, S1, S2 normal, no murmur, click, rub or gallop GI: soft, non-tender; bowel sounds normal; no masses,  no organomegaly Extremities: trigger right thumb Pulses: 2+ and symmetric Skin: Skin color, texture, turgor normal. No rashes or lesions Neurologic: Grossly normal Incision/Wound: na  Assessment/Plan DIAGNOSIS:   Stenosing tenosynovitis right thumb.  PLAN:  Release A-1 pulley right thumb as an outpatient under regional anesthesia. She is aware that there is no guarantee with the surgery, the possibility of infection, recurrence, injury to arteries, nerves, tendons, incomplete relief of symptoms and dystrophy.    Kyran Whittier R 07/19/2014, 9:09 AM

## 2014-07-19 NOTE — Discharge Instructions (Addendum)

## 2014-07-19 NOTE — Op Note (Signed)
NAME:  Stephanie Holden, Stephanie Holden                 ACCOUNT NO.:  [id number]  MEDICAL RECORD NO.:  [id number]  LOCATION:                                 FACILITY:  PHYSICIAN:  Daryll Brod, M.D.            DATE OF BIRTH:  DATE OF PROCEDURE:  07/19/2014 DATE OF DISCHARGE:                              OPERATIVE REPORT   PREOPERATIVE DIAGNOSIS:  Stenosing tenosynovitis, right thumb.  POSTOPERATIVE DIAGNOSIS:  Stenosing tenosynovitis, right thumb.  OPERATION:  Release of A1 pulley, right thumb.  SURGEON:  Daryll Brod, M.D.  ANESTHESIA:  Forearm-based IV regional with local infiltration.  ANESTHESIOLOGIST:  Soledad Gerlach, MD.  HISTORY:  The patient is a 39 year old female with a history of triggering of her right thumb.  This has not responded to conservative treatment.  She has elected to undergo surgical release of the A1 pulley of the right thumb.  Pre, peri, and postoperative course have been discussed along with risks and complications.  She is aware that there is no guarantee with the surgery; possibility of infection; recurrence of injury to arteries, nerves, tendons; incomplete relief of symptoms; dystrophy.  In the preoperative area, the patient was seen, the extremity marked by both the patient and surgeon.  Antibiotic given.  PROCEDURE IN DETAIL:  The patient was brought to the operating room where forearm-based IV regional anesthetic was carried out without difficulty.  She was prepped using ChloraPrep in supine position with right arm free.  A 3-minute dry time was allowed.  Time-out taken, confirming the patient and procedure.  A transverse incision was made over the A1 pulley of the right thumb, carried down through subcutaneous tissue.  Neurovascular structures identified and protected with retractors.  The A1 pulley was identified.  This was released on its radial aspect, taking care to protect the oblique pulley.  The tenosynovial tissue proximally was separated with blunt  dissection.  The thumb placed through a full range of motion, no further triggering was noted.  The wound was copiously irrigated with saline.  The skin closed with interrupted 4-0 nylon sutures.  A sterile compressive dressing with the thumb free was applied.  On deflation of the tourniquet, all fingers immediately pinked.  She was taken to the recovery room for observation in satisfactory condition.  She will be discharged home to return to the [facility] in 1 week on Norco.          ______________________________ Daryll Brod, M.D.     GK/MEDQ  D:  07/19/2014  T:  07/19/2014  Job:  [id number]

## 2014-07-19 NOTE — Anesthesia Preprocedure Evaluation (Addendum)
Anesthesia Evaluation  Patient identified by MRN, date of birth, ID band Patient awake    Reviewed: Allergy & Precautions, NPO status   Airway Mallampati: I  TM Distance: >3 FB Neck ROM: Full    Dental   Pulmonary former smoker,  breath sounds clear to auscultation        Cardiovascular negative cardio ROS  Rhythm:Regular Rate:Normal     Neuro/Psych negative neurological ROS     GI/Hepatic negative GI ROS, Neg liver ROS,   Endo/Other  diabetes, Insulin Dependent  Renal/GU negative Renal ROS     Musculoskeletal   Abdominal   Peds  Hematology negative hematology ROS (+)   Anesthesia Other Findings   Reproductive/Obstetrics                            Anesthesia Physical Anesthesia Plan  ASA: II  Anesthesia Plan: MAC and Bier Block   Post-op Pain Management:    Induction: Intravenous  Airway Management Planned: Simple Face Mask and Natural Airway  Additional Equipment:   Intra-op Plan:   Post-operative Plan:   Informed Consent: I have reviewed the patients History and Physical, chart, labs and discussed the procedure including the risks, benefits and alternatives for the proposed anesthesia with the patient or authorized representative who has indicated his/her understanding and acceptance.     Plan Discussed with: CRNA  Anesthesia Plan Comments:         Anesthesia Quick Evaluation

## 2014-07-20 ENCOUNTER — Encounter (HOSPITAL_BASED_OUTPATIENT_CLINIC_OR_DEPARTMENT_OTHER): Payer: Self-pay | Admitting: Orthopedic Surgery

## 2014-07-20 NOTE — Addendum Note (Signed)
Addendum  created 07/20/14 0827 by Ernesta Amble Zoeya Gramajo, CRNA   Modules edited: Anesthesia Events

## 2014-09-06 ENCOUNTER — Ambulatory Visit (INDEPENDENT_AMBULATORY_CARE_PROVIDER_SITE_OTHER): Payer: BLUE CROSS/BLUE SHIELD | Admitting: Certified Nurse Midwife

## 2014-09-06 ENCOUNTER — Encounter: Payer: Self-pay | Admitting: Certified Nurse Midwife

## 2014-09-06 VITALS — BP 110/70 | HR 72 | Resp 20 | Ht 63.0 in | Wt 159.0 lb

## 2014-09-06 DIAGNOSIS — Z0000 Encounter for general adult medical examination without abnormal findings: Secondary | ICD-10-CM

## 2014-09-06 DIAGNOSIS — N390 Urinary tract infection, site not specified: Secondary | ICD-10-CM

## 2014-09-06 DIAGNOSIS — Z01419 Encounter for gynecological examination (general) (routine) without abnormal findings: Secondary | ICD-10-CM

## 2014-09-06 DIAGNOSIS — Z124 Encounter for screening for malignant neoplasm of cervix: Secondary | ICD-10-CM | POA: Diagnosis not present

## 2014-09-06 LAB — POCT URINALYSIS DIPSTICK
Bilirubin, UA: NEGATIVE
Glucose, UA: NEGATIVE
Ketones, UA: NEGATIVE
Nitrite, UA: NEGATIVE
PH UA: 5
Urobilinogen, UA: NEGATIVE

## 2014-09-06 MED ORDER — CIPROFLOXACIN HCL 500 MG PO TABS: 500.0000 mg | ORAL_TABLET | Freq: Two times a day (BID) | ORAL | Status: DC

## 2014-09-06 NOTE — Progress Notes (Signed)
39 y.o. [id number] Married  Caucasian Fe here for annual exam.Periods normal, no issues, except for spotting with ovulation. Sees Endocrine Dr. Osborne Casco, for Type Diabetes 1, hypertension management, good control/ labs and aex. Complaining of urinary frequency, urgency and pain with onset 2 days ago. Denies fever or chills or back pain. Drinking adequate water intake. " Feels like UTI". Had bilateral thumb release surgery with good success this time. Daughters teenagers now! No other health issues today.   Patient's last menstrual period was 08/15/2014.          Sexually active: Yes.    The current method of family planning is tubal ligation.    Exercising: Yes.    cardio Smoker:  no  Health Maintenance: Pap:  03-29-13 neg HPV HR neg MMG:  Digital diagnostic bilateral,u/s left breast category c,birads 1:neg Colonoscopy:  none BMD:   none TDaP:  2013 Labs: Wbc 2+, rbc 2+,protein 30+ Self breast exam: done occ   reports that she quit smoking about 13 years ago. She has never used smokeless tobacco. She reports that she does not drink alcohol or use illicit drugs.  Past Medical History  Diagnosis Date  . Insulin pump in place   . Stenosing tenosynovitis of thumb 06/2014    right  . Insulin dependent type 1 diabetes mellitus     Past Surgical History  Procedure Laterality Date  . Cesarean section  2000; 08/20/2001  . Essure tubal ligation  2005    failed, tubal puncture  . Laparoscopic unilateral salpingectomy  2005  . Laparoscopic tubal ligation  04/02/2007    removal Essure  . Carpal tunnel release Bilateral 10/28/2013    Procedure: BILATERAL CARPAL TUNNEL RELEASE;  Surgeon: Daryll Brod, MD;  Location: [place];  Service: Orthopedics;  Laterality: Bilateral;  . Trigger finger release Left 04/12/2014    Procedure: RELEASE A-1 PULLEY LEFT THUMB;  Surgeon: Daryll Brod, MD;  Location: [place];  Service: Orthopedics;  Laterality: Left;  . Trigger finger release  Right 07/19/2014    Procedure: RELEASE TRIGGER FINGER/A-1 PULLEY RIGHT THUMB;  Surgeon: Daryll Brod, MD;  Location: [place];  Service: Orthopedics;  Laterality: Right;    Current Outpatient Prescriptions  Medication Sig Dispense Refill  . benazepril (LOTENSIN) 10 MG tablet Take 10 mg by mouth daily. For kidney    . insulin lispro (HUMALOG) 100 UNIT/ML injection Inject into the skin 3 (three) times daily before meals. Cont pump    . Multiple Vitamin (MULTIVITAMIN) tablet Take 1 tablet by mouth daily.    . naproxen sodium (ANAPROX) 220 MG tablet Take 220 mg by mouth 2 (two) times daily with a meal.    . ONE TOUCH ULTRA TEST test strip PT TEST SUGARS 6 TIMES DAILY ON PUMP DX: E10.9  8  . Probiotic Product (PROBIOTIC DAILY PO) Take by mouth.     No current facility-administered medications for this visit.    History reviewed. No pertinent family history.  ROS:  Pertinent items are noted in HPI.  Otherwise, a comprehensive ROS was negative.  Exam:   BP 110/70 mmHg  Pulse 72  Resp 20  Ht 5\' 3"  (1.6 m)  Wt 159 lb (72.122 kg)  BMI 28.17 kg/m2  LMP 08/15/2014 Height: 5\' 3"  (160 cm) Ht Readings from Last 3 Encounters:  09/06/14 5\' 3"  (1.6 m)  07/19/14 5\' 2"  (1.575 m)  04/12/14 5\' 2"  (1.575 m)    General appearance: alert, cooperative and appears stated age  Head: Normocephalic, without obvious abnormality, atraumatic Neck: no adenopathy, supple, symmetrical, trachea midline and thyroid normal to inspection and palpation Lungs: clear to auscultation bilaterally CVAT bilateral negative Breasts: normal appearance, no masses or tenderness, No nipple retraction or dimpling, No nipple discharge or bleeding, No axillary or supraclavicular adenopathy Heart: regular rate and rhythm Abdomen: soft, non-tender; no masses,  no organomegaly, slight suprapubic tenderness Extremities: extremities normal, atraumatic, no cyanosis or edema Skin: Skin color, texture, turgor normal. No  rashes or lesions, warm and dry Lymph nodes: Cervical, supraclavicular, and axillary nodes normal. No abnormal inguinal nodes palpated Neurologic: Grossly normal   Pelvic: External genitalia:  no lesions              Urethra:  normal appearing urethra with no masses, tender or lesions  Bladder tender, urethral meatus slightly red and tender              Bartholin's and Skene's: normal                 Vagina: normal appearing vagina with normal color and discharge, no lesions              Cervix: normal, non tender, no lesions              Pap taken: Yes.   Bimanual Exam:  Uterus:  normal size, contour, position, consistency, mobility, non-tender              Adnexa: normal adnexa and no mass, fullness, tenderness               Rectovaginal: Confirms               Anus:  normal sphincter tone, no lesions  Chaperone present: Yes  A:  Well Woman with normal exam  Contraception  Spouse vasectomy  UTI  Type 2 Diabetes Hypertension under good control with MD  P:   Reviewed health and wellness pertinent to exam  Discussed finding of UTI and etiology, continue to increase water intake. Warning signs of UTI given.  Rx Cipro see order  Lab: Urine micro and culture  Continued follow with MD as indicated  Pap smear with HPV reflex   counseled on breast self exam, adequate intake of calcium and vitamin D, diet and exercise  return annually or prn  An After Visit Summary was printed and given to the patient.

## 2014-09-06 NOTE — Patient Instructions (Addendum)
EXERCISE AND DIET:  We recommended that you start or continue a regular exercise program for good health. Regular exercise means any activity that makes your heart beat faster and makes you sweat.  We recommend exercising at least 30 minutes per day at least 3 days a week, preferably 4 or 5.  We also recommend a diet low in fat and sugar.  Inactivity, poor dietary choices and obesity can cause diabetes, heart attack, stroke, and kidney damage, among others.    ALCOHOL AND SMOKING:  Women should limit their alcohol intake to no more than 7 drinks/beers/glasses of wine (combined, not each!) per week. Moderation of alcohol intake to this level decreases your risk of breast cancer and liver damage. And of course, no recreational drugs are part of a healthy lifestyle.  And absolutely no smoking or even second hand smoke. Most people know smoking can cause heart and lung diseases, but did you know it also contributes to weakening of your bones? Aging of your skin?  Yellowing of your teeth and nails?  CALCIUM AND VITAMIN D:  Adequate intake of calcium and Vitamin D are recommended.  The recommendations for exact amounts of these supplements seem to change often, but generally speaking 600 mg of calcium (either carbonate or citrate) and 800 units of Vitamin D per day seems prudent. Certain women may benefit from higher intake of Vitamin D.  If you are among these women, your doctor will have told you during your visit.    PAP SMEARS:  Pap smears, to check for cervical cancer or precancers,  have traditionally been done yearly, although recent scientific advances have shown that most women can have pap smears less often.  However, every woman still should have a physical exam from her gynecologist every year. It will include a breast check, inspection of the vulva and vagina to check for abnormal growths or skin changes, a visual exam of the cervix, and then an exam to evaluate the size and shape of the uterus and  ovaries.  And after 38 years of age, a rectal exam is indicated to check for rectal cancers. We will also provide age appropriate advice regarding health maintenance, like when you should have certain vaccines, screening for sexually transmitted diseases, bone density testing, colonoscopy, mammograms, etc.   MAMMOGRAMS:  All women over 40 years old should have a yearly mammogram. Many facilities now offer a "3D" mammogram, which may cost around $50 extra out of pocket. If possible,  we recommend you accept the option to have the 3D mammogram performed.  It both reduces the number of women who will be called back for extra views which then turn out to be normal, and it is better than the routine mammogram at detecting truly abnormal areas.    COLONOSCOPY:  Colonoscopy to screen for colon cancer is recommended for all women at age 50.  We know, you hate the idea of the prep.  We agree, BUT, having colon cancer and not knowing it is worse!!  Colon cancer so often starts as a polyp that can be seen and removed at colonscopy, which can quite literally save your life!  And if your first colonoscopy is normal and you have no family history of colon cancer, most women don't have to have it again for 10 years.  Once every ten years, you can do something that may end up saving your life, right?  We will be happy to help you get it scheduled when you are ready.    Be sure to check your insurance coverage so you understand how much it will cost.  It may be covered as a preventative service at no cost, but you should check your particular policy.     Urinary Tract Infection Urinary tract infections (UTIs) can develop anywhere along your urinary tract. Your urinary tract is your body's drainage system for removing wastes and extra water. Your urinary tract includes two kidneys, two ureters, a bladder, and a urethra. Your kidneys are a pair of bean-shaped organs. Each kidney is about the size of your fist. They are located  below your ribs, one on each side of your spine. CAUSES Infections are caused by microbes, which are microscopic organisms, including fungi, viruses, and bacteria. These organisms are so small that they can only be seen through a microscope. Bacteria are the microbes that most commonly cause UTIs. SYMPTOMS  Symptoms of UTIs may vary by age and gender of the patient and by the location of the infection. Symptoms in young women typically include a frequent and intense urge to urinate and a painful, burning feeling in the bladder or urethra during urination. Older women and men are more likely to be tired, shaky, and weak and have muscle aches and abdominal pain. A fever may mean the infection is in your kidneys. Other symptoms of a kidney infection include pain in your back or sides below the ribs, nausea, and vomiting. DIAGNOSIS To diagnose a UTI, your caregiver will ask you about your symptoms. Your caregiver also will ask to provide a urine sample. The urine sample will be tested for bacteria and white blood cells. White blood cells are made by your body to help fight infection. TREATMENT  Typically, UTIs can be treated with medication. Because most UTIs are caused by a bacterial infection, they usually can be treated with the use of antibiotics. The choice of antibiotic and length of treatment depend on your symptoms and the type of bacteria causing your infection. HOME CARE INSTRUCTIONS  If you were prescribed antibiotics, take them exactly as your caregiver instructs you. Finish the medication even if you feel better after you have only taken some of the medication.  Drink enough water and fluids to keep your urine clear or pale yellow.  Avoid caffeine, tea, and carbonated beverages. They tend to irritate your bladder.  Empty your bladder often. Avoid holding urine for long periods of time.  Empty your bladder before and after sexual intercourse.  After a bowel movement, women should cleanse  from front to back. Use each tissue only once. SEEK MEDICAL CARE IF:   You have back pain.  You develop a fever.  Your symptoms do not begin to resolve within 3 days. SEEK IMMEDIATE MEDICAL CARE IF:   You have severe back pain or lower abdominal pain.  You develop chills.  You have nausea or vomiting.  You have continued burning or discomfort with urination. MAKE SURE YOU:   Understand these instructions.  Will watch your condition.  Will get help right away if you are not doing well or get worse. Document Released: 12/19/2004 Document Revised: 09/10/2011 Document Reviewed: 04/19/2011 [facility] Patient Information 2015 [place], [place]. This information is not intended to replace advice given to you by your health care provider. Make sure you discuss any questions you have with your health care provider.

## 2014-09-07 LAB — URINALYSIS, MICROSCOPIC ONLY
CRYSTALS: NONE SEEN
Casts: NONE SEEN
SQUAMOUS EPITHELIAL / LPF: NONE SEEN

## 2014-09-08 LAB — IPS PAP TEST WITH REFLEX TO HPV

## 2014-09-08 NOTE — Progress Notes (Signed)
Reviewed personally.  M. Suzanne Lasalle Abee, MD.  

## 2014-09-09 LAB — URINE CULTURE

## 2014-09-21 ENCOUNTER — Ambulatory Visit (INDEPENDENT_AMBULATORY_CARE_PROVIDER_SITE_OTHER): Payer: BLUE CROSS/BLUE SHIELD | Admitting: *Deleted

## 2014-09-21 VITALS — BP 108/70 | Resp 18 | Ht 63.0 in | Wt 158.6 lb

## 2014-09-21 DIAGNOSIS — N390 Urinary tract infection, site not specified: Secondary | ICD-10-CM | POA: Diagnosis not present

## 2014-09-21 DIAGNOSIS — R319 Hematuria, unspecified: Secondary | ICD-10-CM | POA: Diagnosis not present

## 2014-09-21 NOTE — Progress Notes (Signed)
Patient is here for uti treated 09/06/14 Patient completed antibiotics and is feeling better. Patient to come back this afternoon to leave urine sample for culture.

## 2014-09-22 ENCOUNTER — Ambulatory Visit (INDEPENDENT_AMBULATORY_CARE_PROVIDER_SITE_OTHER): Payer: BLUE CROSS/BLUE SHIELD | Admitting: *Deleted

## 2014-09-22 NOTE — Progress Notes (Signed)
Patient came in left urine sample, urine sample collected and urine sent for culture. Patient was here yesterday 09/21/14 forget to leave sample (see encounter for documentation).

## 2014-09-23 LAB — URINE CULTURE

## 2014-09-27 ENCOUNTER — Telehealth: Payer: Self-pay

## 2014-09-27 NOTE — Telephone Encounter (Signed)
-----   Message from Regina Eck, CNM sent at 09/27/2014  7:59 AM EDT ----- Notify patient that culture showed 30,000 mult bacteria, but none predominant, no further antibiotic treatment needed. Is she having any vaginal symptoms, sometimes this can be from vagina, if not symptomatic no treatment.

## 2014-09-27 NOTE — Telephone Encounter (Signed)
[email or web address]

## 2014-09-28 NOTE — Telephone Encounter (Signed)
Patient notified of results. See lab 

## 2015-04-12 DIAGNOSIS — M65322 Trigger finger, left index finger: Secondary | ICD-10-CM | POA: Insufficient documentation

## 2015-06-30 DIAGNOSIS — M84375A Stress fracture, left foot, initial encounter for fracture: Secondary | ICD-10-CM | POA: Diagnosis not present

## 2015-06-30 DIAGNOSIS — E559 Vitamin D deficiency, unspecified: Secondary | ICD-10-CM | POA: Diagnosis not present

## 2015-07-06 DIAGNOSIS — M84375D Stress fracture, left foot, subsequent encounter for fracture with routine healing: Secondary | ICD-10-CM | POA: Diagnosis not present

## 2015-07-17 DIAGNOSIS — E1065 Type 1 diabetes mellitus with hyperglycemia: Secondary | ICD-10-CM | POA: Diagnosis not present

## 2015-07-17 DIAGNOSIS — G4700 Insomnia, unspecified: Secondary | ICD-10-CM | POA: Diagnosis not present

## 2015-07-17 DIAGNOSIS — E109 Type 1 diabetes mellitus without complications: Secondary | ICD-10-CM | POA: Diagnosis not present

## 2015-07-17 DIAGNOSIS — Z794 Long term (current) use of insulin: Secondary | ICD-10-CM | POA: Diagnosis not present

## 2015-09-12 DIAGNOSIS — E109 Type 1 diabetes mellitus without complications: Secondary | ICD-10-CM | POA: Diagnosis not present

## 2015-09-19 ENCOUNTER — Ambulatory Visit (INDEPENDENT_AMBULATORY_CARE_PROVIDER_SITE_OTHER): Payer: BLUE CROSS/BLUE SHIELD | Admitting: Certified Nurse Midwife

## 2015-09-19 ENCOUNTER — Encounter: Payer: Self-pay | Admitting: Certified Nurse Midwife

## 2015-09-19 VITALS — BP 110/70 | HR 74 | Resp 14 | Ht 62.5 in | Wt 162.8 lb

## 2015-09-19 DIAGNOSIS — Z124 Encounter for screening for malignant neoplasm of cervix: Secondary | ICD-10-CM | POA: Diagnosis not present

## 2015-09-19 DIAGNOSIS — Z01419 Encounter for gynecological examination (general) (routine) without abnormal findings: Secondary | ICD-10-CM | POA: Diagnosis not present

## 2015-09-19 DIAGNOSIS — Z1151 Encounter for screening for human papillomavirus (HPV): Secondary | ICD-10-CM | POA: Diagnosis not present

## 2015-09-19 NOTE — Progress Notes (Signed)
40 y.o. [id number] Married  Caucasian Fe here for annual exam. Periods normal. No issues. Sees PCP/endocrinologist,for diabetes/ hypertension stable with meds, no issues. Good year! Staying busy with her projects. No health issues today. Daughter doing well with her insulin pump and diabetes diagnosis.  Patient's last menstrual period was 09/03/2015.          Sexually active: Yes.    The current method of family planning is tubal ligation.    Exercising: Yes.    Weights Smoker:  no  Health Maintenance: Pap:  09/06/14 WNL MMG: 04/10/23 BIRADS1 TDaP: 03/26/2011 HIV: 2002 Labs: PCP/Endocrinologist   reports that she quit smoking about 14 years ago. She has never used smokeless tobacco. She reports that she drinks about 0.6 oz of alcohol per week. She reports that she does not use illicit drugs.  Past Medical History  Diagnosis Date  . Insulin pump in place   . Stenosing tenosynovitis of thumb 06/2014    right  . Insulin dependent type 1 diabetes mellitus [facility])     Past Surgical History  Procedure Laterality Date  . Cesarean section  2000; 08/20/2001  . Essure tubal ligation  2005    failed, tubal puncture  . Laparoscopic unilateral salpingectomy  2005  . Laparoscopic tubal ligation  04/02/2007    removal Essure  . Carpal tunnel release Bilateral 10/28/2013    Procedure: BILATERAL CARPAL TUNNEL RELEASE;  Surgeon: Daryll Brod, MD;  Location: [place];  Service: Orthopedics;  Laterality: Bilateral;  . Trigger finger release Left 04/12/2014    Procedure: RELEASE A-1 PULLEY LEFT THUMB;  Surgeon: Daryll Brod, MD;  Location: [place];  Service: Orthopedics;  Laterality: Left;  . Trigger finger release Right 07/19/2014    Procedure: RELEASE TRIGGER FINGER/A-1 PULLEY RIGHT THUMB;  Surgeon: Daryll Brod, MD;  Location: [place];  Service: Orthopedics;  Laterality: Right;    Current Outpatient Prescriptions  Medication Sig Dispense Refill  . benazepril  (LOTENSIN) 10 MG tablet Take 10 mg by mouth daily. For kidney    . insulin lispro (HUMALOG) 100 UNIT/ML injection Inject into the skin 3 (three) times daily before meals. Cont pump    . Multiple Vitamin (MULTIVITAMIN) tablet Take 1 tablet by mouth daily.    . naproxen sodium (ANAPROX) 220 MG tablet Take 220 mg by mouth 2 (two) times daily with a meal.    . ONE TOUCH ULTRA TEST test strip PT TEST SUGARS 6 TIMES DAILY ON PUMP DX: E10.9  8  . Probiotic Product (PROBIOTIC DAILY PO) Take by mouth.     No current facility-administered medications for this visit.    No family history on file.  ROS:  Pertinent items are noted in HPI.  Otherwise, a comprehensive ROS was negative.  Exam:   BP 110/70 mmHg  Pulse 74  Resp 14  Ht 5' 2.5" (1.588 m)  Wt 162 lb 12.8 oz (73.846 kg)  BMI 29.28 kg/m2  LMP 09/03/2015 Height: 5' 2.5" (158.8 cm) Ht Readings from Last 3 Encounters:  09/19/15 5' 2.5" (1.588 m)  09/21/14 5\' 3"  (1.6 m)  09/06/14 5\' 3"  (1.6 m)    General appearance: alert, cooperative and appears stated age Head: Normocephalic, without obvious abnormality, atraumatic Neck: no adenopathy, supple, symmetrical, trachea midline and thyroid normal to inspection and palpation Lungs: clear to auscultation bilaterally Breasts: normal appearance, no masses or tenderness, No nipple retraction or dimpling, No nipple discharge or bleeding, No axillary or supraclavicular adenopathy Heart: regular  rate and rhythm Abdomen: soft, non-tender; no masses,  no organomegaly Extremities: extremities normal, atraumatic, no cyanosis or edema Skin: Skin color, texture, turgor normal. No rashes or lesions Lymph nodes: Cervical, supraclavicular, and axillary nodes normal. No abnormal inguinal nodes palpated Neurologic: Grossly normal   Pelvic: External genitalia:  no lesions              Urethra:  normal appearing urethra with no masses, tenderness or lesions              Bartholin's and Skene's: normal                  Vagina: normal appearing vagina with normal color and discharge, no lesions              Cervix: normal, no lesions or tenderness              Pap taken: Yes.   Bimanual Exam:  Uterus:  normal size, contour, position, consistency, mobility, non-tender and anteverted              Adnexa: normal adnexa and no mass, fullness, tenderness               Rectovaginal: Confirms               Anus:  normal sphincter tone, no lesions  Chaperone present: yes  A:  Well Woman with normal exam  Contraception tubal  Insulin Dependent diabetic/hypertension stable with Endocrine managment  P:   Reviewed health and wellness pertinent to exam  Continue follow up with Endocrine as indicated  Pap smear with HPVHR   counseled on breast self exam, adequate intake of calcium and vitamin D, diet and exercise  return annually or prn  An After Visit Summary was printed and given to the patient.

## 2015-09-19 NOTE — Progress Notes (Signed)
Reviewed personally.  M. Suzanne Camela Wich, MD.  

## 2015-09-19 NOTE — Patient Instructions (Signed)

## 2015-09-21 LAB — IPS PAP TEST WITH HPV

## 2015-10-02 DIAGNOSIS — G5622 Lesion of ulnar nerve, left upper limb: Secondary | ICD-10-CM | POA: Diagnosis not present

## 2015-10-02 DIAGNOSIS — M65322 Trigger finger, left index finger: Secondary | ICD-10-CM | POA: Diagnosis not present

## 2015-10-11 DIAGNOSIS — E109 Type 1 diabetes mellitus without complications: Secondary | ICD-10-CM | POA: Diagnosis not present

## 2015-10-23 DIAGNOSIS — R635 Abnormal weight gain: Secondary | ICD-10-CM | POA: Diagnosis not present

## 2015-10-23 DIAGNOSIS — E109 Type 1 diabetes mellitus without complications: Secondary | ICD-10-CM | POA: Diagnosis not present

## 2015-10-25 DIAGNOSIS — M65322 Trigger finger, left index finger: Secondary | ICD-10-CM | POA: Diagnosis not present

## 2015-10-25 DIAGNOSIS — G5622 Lesion of ulnar nerve, left upper limb: Secondary | ICD-10-CM | POA: Diagnosis not present

## 2015-10-30 DIAGNOSIS — E109 Type 1 diabetes mellitus without complications: Secondary | ICD-10-CM | POA: Diagnosis not present

## 2015-11-15 DIAGNOSIS — E109 Type 1 diabetes mellitus without complications: Secondary | ICD-10-CM | POA: Diagnosis not present

## 2015-12-11 DIAGNOSIS — E109 Type 1 diabetes mellitus without complications: Secondary | ICD-10-CM | POA: Diagnosis not present

## 2016-01-22 DIAGNOSIS — E1065 Type 1 diabetes mellitus with hyperglycemia: Secondary | ICD-10-CM | POA: Diagnosis not present

## 2016-01-22 DIAGNOSIS — Z23 Encounter for immunization: Secondary | ICD-10-CM | POA: Diagnosis not present

## 2016-01-22 DIAGNOSIS — E11319 Type 2 diabetes mellitus with unspecified diabetic retinopathy without macular edema: Secondary | ICD-10-CM | POA: Diagnosis not present

## 2016-01-22 DIAGNOSIS — Z4681 Encounter for fitting and adjustment of insulin pump: Secondary | ICD-10-CM | POA: Diagnosis not present

## 2016-01-22 DIAGNOSIS — Z794 Long term (current) use of insulin: Secondary | ICD-10-CM | POA: Diagnosis not present

## 2016-01-22 DIAGNOSIS — E109 Type 1 diabetes mellitus without complications: Secondary | ICD-10-CM | POA: Diagnosis not present

## 2016-01-23 DIAGNOSIS — E109 Type 1 diabetes mellitus without complications: Secondary | ICD-10-CM | POA: Diagnosis not present

## 2016-01-26 DIAGNOSIS — E109 Type 1 diabetes mellitus without complications: Secondary | ICD-10-CM | POA: Diagnosis not present

## 2016-02-06 DIAGNOSIS — E109 Type 1 diabetes mellitus without complications: Secondary | ICD-10-CM | POA: Diagnosis not present

## 2016-04-17 DIAGNOSIS — E109 Type 1 diabetes mellitus without complications: Secondary | ICD-10-CM | POA: Diagnosis not present

## 2016-04-26 DIAGNOSIS — E109 Type 1 diabetes mellitus without complications: Secondary | ICD-10-CM | POA: Diagnosis not present

## 2016-04-26 DIAGNOSIS — Z4681 Encounter for fitting and adjustment of insulin pump: Secondary | ICD-10-CM | POA: Diagnosis not present

## 2016-04-26 DIAGNOSIS — Z794 Long term (current) use of insulin: Secondary | ICD-10-CM | POA: Diagnosis not present

## 2016-04-26 DIAGNOSIS — Z6828 Body mass index (BMI) 28.0-28.9, adult: Secondary | ICD-10-CM | POA: Diagnosis not present

## 2016-04-26 DIAGNOSIS — E1065 Type 1 diabetes mellitus with hyperglycemia: Secondary | ICD-10-CM | POA: Diagnosis not present

## 2016-05-02 DIAGNOSIS — E1065 Type 1 diabetes mellitus with hyperglycemia: Secondary | ICD-10-CM | POA: Diagnosis not present

## 2016-05-02 DIAGNOSIS — Z4681 Encounter for fitting and adjustment of insulin pump: Secondary | ICD-10-CM | POA: Diagnosis not present

## 2016-05-02 DIAGNOSIS — Z794 Long term (current) use of insulin: Secondary | ICD-10-CM | POA: Diagnosis not present

## 2016-05-02 DIAGNOSIS — E11319 Type 2 diabetes mellitus with unspecified diabetic retinopathy without macular edema: Secondary | ICD-10-CM | POA: Diagnosis not present

## 2016-05-10 DIAGNOSIS — H5213 Myopia, bilateral: Secondary | ICD-10-CM | POA: Diagnosis not present

## 2016-05-10 DIAGNOSIS — E113291 Type 2 diabetes mellitus with mild nonproliferative diabetic retinopathy without macular edema, right eye: Secondary | ICD-10-CM | POA: Diagnosis not present

## 2016-06-21 DIAGNOSIS — Z794 Long term (current) use of insulin: Secondary | ICD-10-CM | POA: Diagnosis not present

## 2016-06-21 DIAGNOSIS — E109 Type 1 diabetes mellitus without complications: Secondary | ICD-10-CM | POA: Diagnosis not present

## 2016-07-01 DIAGNOSIS — Z794 Long term (current) use of insulin: Secondary | ICD-10-CM | POA: Diagnosis not present

## 2016-07-01 DIAGNOSIS — E109 Type 1 diabetes mellitus without complications: Secondary | ICD-10-CM | POA: Diagnosis not present

## 2016-07-22 DIAGNOSIS — R590 Localized enlarged lymph nodes: Secondary | ICD-10-CM | POA: Diagnosis not present

## 2016-07-22 DIAGNOSIS — Z6829 Body mass index (BMI) 29.0-29.9, adult: Secondary | ICD-10-CM | POA: Diagnosis not present

## 2016-07-25 ENCOUNTER — Other Ambulatory Visit: Payer: Self-pay | Admitting: Internal Medicine

## 2016-07-25 DIAGNOSIS — R590 Localized enlarged lymph nodes: Secondary | ICD-10-CM

## 2016-07-30 DIAGNOSIS — Z794 Long term (current) use of insulin: Secondary | ICD-10-CM | POA: Diagnosis not present

## 2016-07-30 DIAGNOSIS — E109 Type 1 diabetes mellitus without complications: Secondary | ICD-10-CM | POA: Diagnosis not present

## 2016-07-31 ENCOUNTER — Ambulatory Visit
Admission: RE | Admit: 2016-07-31 | Discharge: 2016-07-31 | Disposition: A | Discharge status: Home / Self Care | Payer: BLUE CROSS/BLUE SHIELD | Source: Ambulatory Visit | Attending: Internal Medicine | Admitting: Internal Medicine

## 2016-07-31 ENCOUNTER — Other Ambulatory Visit: Payer: Self-pay | Admitting: Internal Medicine

## 2016-07-31 DIAGNOSIS — R928 Other abnormal and inconclusive findings on diagnostic imaging of breast: Secondary | ICD-10-CM | POA: Diagnosis not present

## 2016-07-31 DIAGNOSIS — R590 Localized enlarged lymph nodes: Secondary | ICD-10-CM

## 2016-07-31 DIAGNOSIS — N6331 Unspecified lump in axillary tail of the right breast: Secondary | ICD-10-CM | POA: Diagnosis not present

## 2016-09-06 DIAGNOSIS — Z794 Long term (current) use of insulin: Secondary | ICD-10-CM | POA: Diagnosis not present

## 2016-09-06 DIAGNOSIS — E109 Type 1 diabetes mellitus without complications: Secondary | ICD-10-CM | POA: Diagnosis not present

## 2016-09-12 ENCOUNTER — Other Ambulatory Visit: Payer: BLUE CROSS/BLUE SHIELD

## 2016-09-20 ENCOUNTER — Ambulatory Visit: Payer: BLUE CROSS/BLUE SHIELD | Admitting: Certified Nurse Midwife

## 2016-10-01 DIAGNOSIS — E11319 Type 2 diabetes mellitus with unspecified diabetic retinopathy without macular edema: Secondary | ICD-10-CM | POA: Diagnosis not present

## 2016-10-01 DIAGNOSIS — Z794 Long term (current) use of insulin: Secondary | ICD-10-CM | POA: Diagnosis not present

## 2016-10-01 DIAGNOSIS — R635 Abnormal weight gain: Secondary | ICD-10-CM | POA: Diagnosis not present

## 2016-10-01 DIAGNOSIS — E1065 Type 1 diabetes mellitus with hyperglycemia: Secondary | ICD-10-CM | POA: Diagnosis not present

## 2016-10-08 ENCOUNTER — Other Ambulatory Visit (HOSPITAL_COMMUNITY)
Admission: RE | Admit: 2016-10-08 | Discharge: 2016-10-08 | Disposition: A | Discharge status: Home / Self Care | Payer: BLUE CROSS/BLUE SHIELD | Source: Ambulatory Visit | Attending: Obstetrics & Gynecology | Admitting: Obstetrics & Gynecology

## 2016-10-08 ENCOUNTER — Ambulatory Visit: Payer: BLUE CROSS/BLUE SHIELD | Admitting: Certified Nurse Midwife

## 2016-10-08 ENCOUNTER — Encounter: Payer: Self-pay | Admitting: Certified Nurse Midwife

## 2016-10-08 VITALS — BP 120/76 | HR 70 | Resp 16 | Ht 62.75 in | Wt 167.0 lb

## 2016-10-08 DIAGNOSIS — Z01419 Encounter for gynecological examination (general) (routine) without abnormal findings: Secondary | ICD-10-CM | POA: Diagnosis not present

## 2016-10-08 DIAGNOSIS — Z01411 Encounter for gynecological examination (general) (routine) with abnormal findings: Secondary | ICD-10-CM | POA: Diagnosis not present

## 2016-10-08 DIAGNOSIS — Z124 Encounter for screening for malignant neoplasm of cervix: Secondary | ICD-10-CM | POA: Diagnosis not present

## 2016-10-08 DIAGNOSIS — E041 Nontoxic single thyroid nodule: Secondary | ICD-10-CM

## 2016-10-08 NOTE — Progress Notes (Signed)
41 y.o. Stephanie Holden Married  Caucasian Fe here for annual exam. Periods regular no issues. Diabetes 1 doing well with new pump and so is her daughter who was diagnosed last year with Juvenile diabetes. 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 Sees Dr Sherian Maroon every 6 months for diabetes management, and for aex/labs and prn. Was seen by PCP for enlarged lymph node on right and [place], shown to be enlarged, but had decreased at time of exam. Follow up scheduled for another [place] scheduled. Has not had any pain or tenderness as before.. 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  No LMP recorded.          Sexually active: Yes.    The current method of family planning is tubal ligation.    Exercising: Yes.    weights Smoker:  no  Health Maintenance: Pap:  09-06-14 neg, 09-19-15 neg HPV HR neg History of Abnormal Pap: yes MMG:  07-31-16 bilateral & rt breast category c density birads 3:prob benign Self Breast exams: yes Colonoscopy:  none BMD:   none TDaP:  2013 Shingles: no Pneumonia: no Hep C and HIV: ? Labs: none   reports that she quit smoking about 15 years ago. She has never used smokeless tobacco. She reports that she drinks about 0.6 oz of alcohol per week . She reports that she does not use drugs.  Past Medical History:  Diagnosis Date  . Insulin dependent type 1 diabetes mellitus ([place])   . Insulin pump in place   . Stenosing tenosynovitis of thumb 06/2014   right    Past Surgical History:  Procedure Laterality Date  . CARPAL TUNNEL RELEASE Bilateral 10/28/2013   Procedure: BILATERAL CARPAL TUNNEL RELEASE;  Surgeon: Daryll Brod, MD;  Location: [place];  Service: Orthopedics;  Laterality: Bilateral;  . CESAREAN SECTION  2000; 08/20/2001  . ESSURE TUBAL LIGATION  2005   failed, tubal puncture  . LAPAROSCOPIC TUBAL LIGATION  04/02/2007   removal Essure  . LAPAROSCOPIC UNILATERAL SALPINGECTOMY  2005  . TRIGGER  FINGER RELEASE Left 04/12/2014   Procedure: RELEASE A-1 PULLEY LEFT THUMB;  Surgeon: Daryll Brod, MD;  Location: [place];  Service: Orthopedics;  Laterality: Left;  . TRIGGER FINGER RELEASE Right 07/19/2014   Procedure: RELEASE TRIGGER FINGER/A-1 PULLEY RIGHT THUMB;  Surgeon: Daryll Brod, MD;  Location: [place];  Service: Orthopedics;  Laterality: Right;    Current Outpatient Prescriptions  Medication Sig Dispense Refill  . benazepril (LOTENSIN) 10 MG tablet Take 10 mg by mouth daily. For kidney    . insulin lispro (HUMALOG) 100 UNIT/ML injection Inject into the skin 3 (three) times daily before meals. Cont pump    . Multiple Vitamin (MULTIVITAMIN) tablet Take 1 tablet by mouth daily.    . naproxen sodium (ANAPROX) 220 MG tablet Take 220 mg by mouth 2 (two) times daily with a meal.    . ONE TOUCH ULTRA TEST test strip PT TEST SUGARS 6 TIMES DAILY ON PUMP DX: E10.9  8  . Probiotic Product (PROBIOTIC DAILY PO) Take by mouth.     No current facility-administered medications for this visit.     No family history on file.  ROS:  Pertinent items are noted in HPI.  Otherwise, a comprehensive ROS was negative.  Exam:   There were no vitals taken for this visit.   Ht Readings from Last 3 Encounters:  09/19/15 5' 2.5" (1.588 m)  09/21/14 5\' 3"  (1.6 m)  09/06/14 5\' 3"  (1.6 m)    General appearance: alert, cooperative and appears stated age Head: Normocephalic, without obvious abnormality, atraumatic Neck: no adenopathy, supple, symmetrical, trachea midline and thyroid normal to inspection and palpation, right tyroid nodule still present no change from previous exam Lungs: clear to auscultation bilaterally Breasts: normal appearance, no masses or tenderness, No nipple retraction or dimpling, No nipple discharge or bleeding, No axillary or supraclavicular adenopathy Heart: regular rate and rhythm Abdomen: soft, non-tender; no masses,  no  organomegaly Extremities: extremities normal, atraumatic, no cyanosis or edema Skin: Skin color, texture, turgor normal. No rashes or lesions Lymph nodes: Cervical, supraclavicular, and axillary nodes normal. ? Right axillary lymph  Node palpated No abnormal inguinal nodes palpated Neurologic: Grossly normal   Pelvic: External genitalia:  no lesions              Urethra:  normal appearing urethra with no masses, tenderness or lesions              Bartholin's and Skene's: normal                 Vagina: normal appearing vagina with normal color and discharge, no lesions              Cervix: multiparous appearance, no cervical motion tenderness and no lesions              Pap taken: Yes.   Bimanual Exam:  Uterus:  normal size, contour, position, consistency, mobility, non-tender              Adnexa: normal adnexa and no mass, fullness, tenderness               Rectovaginal: Confirms               Anus:  normal sphincter tone, no lesions  Chaperone present: yes  A:  Well Woman with normal exam  Contraception tubal  Thyroid nodule on right unchanged from previous exam, followed by PCP  6 month right breast follow up from enlarged lymph node that decreased in size,  ? Palpable today  Type 1 diabetes in good control with MD management  Screening labs with PCP  P:   Reviewed health and wellness pertinent to exam  Continue follow up with thyroid nodule if change  Keep appt. For follow up of right breast ? Palpated today  Continue follow up as indicated per PCP  Pap smear: yes   counseled on breast self exam, mammography screening, feminine hygiene, adequate intake of calcium and vitamin D, diet and exercise  return annually or prn  An After Visit Summary was printed and given to the patient.

## 2016-10-08 NOTE — Patient Instructions (Signed)

## 2016-10-10 LAB — CYTOLOGY - PAP

## 2016-10-11 ENCOUNTER — Telehealth: Payer: Self-pay | Admitting: Certified Nurse Midwife

## 2016-10-11 DIAGNOSIS — E109 Type 1 diabetes mellitus without complications: Secondary | ICD-10-CM | POA: Diagnosis not present

## 2016-10-11 DIAGNOSIS — Z794 Long term (current) use of insulin: Secondary | ICD-10-CM | POA: Diagnosis not present

## 2016-10-11 NOTE — Telephone Encounter (Signed)
Phone call to patient regarding abnormal pap smear with Atypical Glandular Cells. Discussed finding and need to evaluate with colposcopy and endometrial biopsy. Questions addressed. Patient history of genital wart long ago, but no abnormal pap. Patient aware she will be called with insurance information and scheduled with Dr. Talbert Nan. Questions regarding procedure addressed.

## 2016-10-15 ENCOUNTER — Telehealth: Payer: Self-pay | Admitting: *Deleted

## 2016-10-15 DIAGNOSIS — R87619 Unspecified abnormal cytological findings in specimens from cervix uteri: Secondary | ICD-10-CM

## 2016-10-15 NOTE — Telephone Encounter (Signed)
-----   Message from Regina Eck, CNM sent at 10/11/2016 12:47 PM EDT ----- Patient notified that pap smear shows Atypical glandular cells which she will need evaluation for with colposcopy and endometrial biopsy.( see telephone note) Please schedule and place orders for colposcopy and endometrial biopsy with Dr. Talbert Nan.  Patient aware she will be called with insurance info and scheduled.

## 2016-10-15 NOTE — Telephone Encounter (Signed)
Spoke with patient, advised as seen below per Melvia Heaps, CNM. Patient will be on vacation 7/29-8/5. LMP 10/10/16, tubal ligation for contraception. Patient scheduled for colpo and EMB on 11/04/16 at 1pm with Dr. Talbert Nan. Advised to take Motrin 800 mg with food and water one hour before procedure. Patient verbalizes understanding and is agreeable.   Orders placed for Colpo & EMB.  Routing to provider for final review. Patient is agreeable to disposition. Will close encounter.   Cc: Dr. Talbert Nan, Theresia Lo

## 2016-10-15 NOTE — Telephone Encounter (Signed)
Left message to call Jered Heiny at [phone].  

## 2016-10-21 ENCOUNTER — Telehealth: Payer: Self-pay | Admitting: Obstetrics and Gynecology

## 2016-10-21 NOTE — Telephone Encounter (Signed)
Called patient to review benefits for colposcopy and endometrial biopsy scheduled 11-04-16. Left voicemail to call back and review.

## 2016-10-22 NOTE — Telephone Encounter (Signed)
Patient returned call.  Cc: Stephanie Holden

## 2016-10-23 NOTE — Telephone Encounter (Signed)
Returned call to patient to review benefit information for scheduled appointment on 11/04/16. Left voicemail message requesting a return call.

## 2016-10-24 NOTE — Telephone Encounter (Signed)
Returning call to Becky.

## 2016-10-24 NOTE — Telephone Encounter (Signed)
Returned call to patient to review benefit information for scheduled appointment on 11/04/16. Left a voicemail message requesting a return call.

## 2016-10-25 NOTE — Telephone Encounter (Signed)
Patient returning call.

## 2016-10-25 NOTE — Telephone Encounter (Signed)
Return call to patient. Left voicemail with benefit detail and arrival date and time. Noted return call number for questions. No further questions. Will close encounter.

## 2016-11-04 ENCOUNTER — Ambulatory Visit (INDEPENDENT_AMBULATORY_CARE_PROVIDER_SITE_OTHER): Payer: BLUE CROSS/BLUE SHIELD | Admitting: Obstetrics and Gynecology

## 2016-11-04 ENCOUNTER — Encounter: Payer: Self-pay | Admitting: Obstetrics and Gynecology

## 2016-11-04 VITALS — BP 126/70 | HR 84 | Resp 16 | Wt 170.0 lb

## 2016-11-04 DIAGNOSIS — Z01812 Encounter for preprocedural laboratory examination: Secondary | ICD-10-CM | POA: Diagnosis not present

## 2016-11-04 DIAGNOSIS — R87619 Unspecified abnormal cytological findings in specimens from cervix uteri: Secondary | ICD-10-CM | POA: Diagnosis not present

## 2016-11-04 DIAGNOSIS — N882 Stricture and stenosis of cervix uteri: Secondary | ICD-10-CM

## 2016-11-04 LAB — POCT URINE PREGNANCY: PREG TEST UR: NEGATIVE

## 2016-11-04 NOTE — Progress Notes (Signed)
GYNECOLOGY  VISIT   HPI: 41 y.o.   Married  Caucasian  female   [phone] with Patient's last menstrual period was 10/10/2016.   here for Colpo/EMB for a pap with Atypical Glandular Cell  Cycles are q month x 5 days. Saturates a regular tampon in 1.5-2 hours. No BTB. Cramps are tolerable.  She had an abnormal pap at 19, f/u normal. No cervical surgery.  Last year she had a negative pap with negative hpv.   GYNECOLOGIC HISTORY: Patient's last menstrual period was 10/10/2016. Contraception:  Tubal ligation Menopausal hormone therapy:  n/a Last mammogram:  07-31-16 bilateral & rt breast category c density birads 3:prob benign Last pap smear:   09-06-14 neg   09-19-15 neg HPV HR neg   10/08/16 Atypical Glandular Cells        OB History    Gravida Para Term Preterm AB Living   3 2 1 1 1 2    SAB TAB Ectopic Multiple Live Births   1                 Patient Active Problem List   Diagnosis Date Noted  . Cubital tunnel syndrome on left 10/02/2015  . Trigger finger, left index finger 04/12/2015  . Erroneous encounter - disregard 09/22/2014  . Diabetes type 1, controlled ([place]) 01/12/2013    Past Medical History:  Diagnosis Date  . Insulin dependent type 1 diabetes mellitus ([place])   . Insulin pump in place   . Stenosing tenosynovitis of thumb 06/2014   right    Past Surgical History:  Procedure Laterality Date  . CARPAL TUNNEL RELEASE Bilateral 10/28/2013   Procedure: BILATERAL CARPAL TUNNEL RELEASE;  Surgeon: Daryll Brod, MD;  Location: [facility];  Service: Orthopedics;  Laterality: Bilateral;  . CESAREAN SECTION  2000; 08/20/2001  . ESSURE TUBAL LIGATION  2005   failed, tubal puncture  . LAPAROSCOPIC TUBAL LIGATION  04/02/2007   removal Essure  . LAPAROSCOPIC UNILATERAL SALPINGECTOMY  2005  . TRIGGER FINGER RELEASE Left 04/12/2014   Procedure: RELEASE A-1 PULLEY LEFT THUMB;  Surgeon: Daryll Brod, MD;  Location: [facility];  Service: Orthopedics;  Laterality:  Left;  . TRIGGER FINGER RELEASE Right 07/19/2014   Procedure: RELEASE TRIGGER FINGER/A-1 PULLEY RIGHT THUMB;  Surgeon: Daryll Brod, MD;  Location: [facility];  Service: Orthopedics;  Laterality: Right;    Current Outpatient Prescriptions  Medication Sig Dispense Refill  . benazepril (LOTENSIN) 10 MG tablet Take 10 mg by mouth daily. For kidney    . Insulin Aspart (NOVOLOG Roff) Inject into the skin.    . Multiple Vitamin (MULTIVITAMIN) tablet Take 1 tablet by mouth daily.    . naproxen sodium (ANAPROX) 220 MG tablet Take 220 mg by mouth 2 (two) times daily with a meal.    . ONE TOUCH ULTRA TEST test strip PT TEST SUGARS 6 TIMES DAILY ON PUMP DX: E10.9  8  . Probiotic Product (PROBIOTIC DAILY PO) Take by mouth.     No current facility-administered medications for this visit.      ALLERGIES: Dilaudid [hydromorphone hcl]  History reviewed. No pertinent family history.  Social History   Social History  . Marital status: Married    Spouse name: N/A  . Number of children: N/A  . Years of education: N/A   Occupational History  . Not on file.   Social History Main Topics  . Smoking status: Former Smoker    Quit date: 03/24/2001  . Smokeless  tobacco: Never Used  . Alcohol use 0.6 oz/week    1 Standard drinks or equivalent per week  . Drug use: No  . Sexual activity: Yes    Partners: Male    Birth control/ protection: Surgical     Comment: tubal ligation & 1 tube removed   Other Topics Concern  . Not on file   Social History Narrative  . No narrative on file    ROS:  Pertinent items are noted in HPI.  PHYSICAL EXAMINATION:    BP 126/70 (BP Location: Right Arm, Patient Position: Sitting, Cuff Size: Normal)   Pulse 84   Resp 16   Wt 170 lb (77.1 kg)   LMP 10/10/2016   BMI 30.35 kg/m     General appearance: alert, cooperative and appears stated age Head: Normocephalic, without obvious abnormality, atraumatic  Pelvic: External genitalia:  no lesions               Urethra:  normal appearing urethra with no masses, tenderness or lesions              Bartholins and Skenes: normal                 Vagina: normal appearing vagina with normal color and discharge, no lesions              Cervix: no gross lesions  The risks of the endometrial biopsy and colposcopy were reviewed and consents were obtained. Colposcopy: unsatisfactory. No aceto-white changes, negative evaluation with lugols of the cervix and upper vagina Her cervix was too stenotic to perform the ECC. The cervix was cleansed with betadine. A tenaculum was placed on the cervix and the mini-dilators through the #4 hagar dilators were used to dilate the cervix. The ECC was then performed. The mini-pipelle was then placed into the endometrial cavity. The uterus sounded to 7-8 cm. The endometrial biopsy was performed, moderate tissue was obtained. The tenaculum and speculum were removed. There were no complications.   Chaperone was present for exam.  ASSESSMENT AGUS pap. The patient has normal menstrual cycles Stenotic cervix    PLAN Discussed atypical glandular pap with the patient Colposcopy with ECC performed Endometrial biopsy obtained    An After Visit Summary was printed and given to the patient.  In addition to the colposcopy and endometrial biopsy, 10 minutes face to face time of which over 50% was spent in counseling.    CC: Melvia Heaps, CNM

## 2016-11-04 NOTE — Patient Instructions (Signed)

## 2016-11-05 DIAGNOSIS — N888 Other specified noninflammatory disorders of cervix uteri: Secondary | ICD-10-CM | POA: Diagnosis not present

## 2016-11-05 NOTE — Addendum Note (Signed)
Addended by: Dorothy Spark on: 11/05/2016 02:53 PM   Modules accepted: Orders

## 2016-11-08 ENCOUNTER — Telehealth: Payer: Self-pay | Admitting: *Deleted

## 2016-11-08 NOTE — Telephone Encounter (Signed)
-----   Message from Salvadore Dom, MD sent at 11/07/2016  5:53 PM EDT ----- Please inform the patient that her biopsies were both normal. Recommend f/u pap in 1 year (08 recall)

## 2016-11-08 NOTE — Telephone Encounter (Signed)
Left message to call regarding lab results- patient placed in 08 recall -eh

## 2016-11-12 NOTE — Telephone Encounter (Signed)
Patient aware - see result note -eh

## 2016-11-21 DIAGNOSIS — E109 Type 1 diabetes mellitus without complications: Secondary | ICD-10-CM | POA: Diagnosis not present

## 2016-11-21 DIAGNOSIS — Z794 Long term (current) use of insulin: Secondary | ICD-10-CM | POA: Diagnosis not present

## 2017-01-08 DIAGNOSIS — F331 Major depressive disorder, recurrent, moderate: Secondary | ICD-10-CM | POA: Diagnosis not present

## 2017-01-08 DIAGNOSIS — Z23 Encounter for immunization: Secondary | ICD-10-CM | POA: Diagnosis not present

## 2017-01-08 DIAGNOSIS — Z794 Long term (current) use of insulin: Secondary | ICD-10-CM | POA: Diagnosis not present

## 2017-01-08 DIAGNOSIS — E041 Nontoxic single thyroid nodule: Secondary | ICD-10-CM | POA: Diagnosis not present

## 2017-01-08 DIAGNOSIS — F418 Other specified anxiety disorders: Secondary | ICD-10-CM | POA: Diagnosis not present

## 2017-01-08 DIAGNOSIS — E11319 Type 2 diabetes mellitus with unspecified diabetic retinopathy without macular edema: Secondary | ICD-10-CM | POA: Diagnosis not present

## 2017-01-08 DIAGNOSIS — R208 Other disturbances of skin sensation: Secondary | ICD-10-CM | POA: Diagnosis not present

## 2017-01-08 DIAGNOSIS — E1065 Type 1 diabetes mellitus with hyperglycemia: Secondary | ICD-10-CM | POA: Diagnosis not present

## 2017-01-24 DIAGNOSIS — E109 Type 1 diabetes mellitus without complications: Secondary | ICD-10-CM | POA: Diagnosis not present

## 2017-01-24 DIAGNOSIS — Z794 Long term (current) use of insulin: Secondary | ICD-10-CM | POA: Diagnosis not present

## 2017-02-23 DIAGNOSIS — E109 Type 1 diabetes mellitus without complications: Secondary | ICD-10-CM | POA: Diagnosis not present

## 2017-02-23 DIAGNOSIS — Z794 Long term (current) use of insulin: Secondary | ICD-10-CM | POA: Diagnosis not present

## 2017-03-25 DIAGNOSIS — Z9221 Personal history of antineoplastic chemotherapy: Secondary | ICD-10-CM

## 2017-03-25 HISTORY — DX: Personal history of antineoplastic chemotherapy: Z92.21

## 2017-04-03 DIAGNOSIS — E10319 Type 1 diabetes mellitus with unspecified diabetic retinopathy without macular edema: Secondary | ICD-10-CM | POA: Diagnosis not present

## 2017-04-03 DIAGNOSIS — E11319 Type 2 diabetes mellitus with unspecified diabetic retinopathy without macular edema: Secondary | ICD-10-CM | POA: Diagnosis not present

## 2017-04-03 DIAGNOSIS — E7800 Pure hypercholesterolemia, unspecified: Secondary | ICD-10-CM | POA: Diagnosis not present

## 2017-04-03 DIAGNOSIS — Z794 Long term (current) use of insulin: Secondary | ICD-10-CM | POA: Diagnosis not present

## 2017-04-28 ENCOUNTER — Telehealth: Payer: Self-pay | Admitting: Certified Nurse Midwife

## 2017-04-28 NOTE — Telephone Encounter (Signed)
Spoke with patient. Reports tender, "pinto bean sized" lump in right axilla. First noticed 03/18/18, has stayed the same, no changes.   Patient was evaluated by PCP in 07/2016 for the same lump and Bilateral DX MMG and [place] completed on right axilla on 07/31/16, 6wk f/u US recommended. Patient states the lump resolved, so she did not return for f/u.   Patient denies fever/chills, skin changes or nipple d/c.   Recommended OV for further evaluation, OV scheduled for 04/29/17 with Melvia Heaps, CNM. Patient verbalizes understanding and is agreeable.   Routing to covering provider provider for final review. Patient is agreeable to disposition. Will close encounter.  Cc: Melvia Heaps, CNM

## 2017-04-28 NOTE — Telephone Encounter (Signed)
Patient has a swollen lymph node under right arm.

## 2017-04-29 ENCOUNTER — Other Ambulatory Visit: Payer: Self-pay

## 2017-04-29 ENCOUNTER — Encounter: Payer: Self-pay | Admitting: Certified Nurse Midwife

## 2017-04-29 ENCOUNTER — Ambulatory Visit: Payer: BLUE CROSS/BLUE SHIELD | Admitting: Certified Nurse Midwife

## 2017-04-29 ENCOUNTER — Other Ambulatory Visit: Payer: Self-pay | Admitting: Certified Nurse Midwife

## 2017-04-29 VITALS — BP 120/70 | HR 70 | Resp 16 | Ht 62.75 in | Wt 170.0 lb

## 2017-04-29 DIAGNOSIS — R599 Enlarged lymph nodes, unspecified: Secondary | ICD-10-CM | POA: Diagnosis not present

## 2017-04-29 DIAGNOSIS — R5383 Other fatigue: Secondary | ICD-10-CM

## 2017-04-29 NOTE — Progress Notes (Signed)
[facility] [place] [facility]17 at 1240. Patient in office when scheduled and agreeable to date and time of appointment.

## 2017-04-29 NOTE — Progress Notes (Signed)
   Subjective:   42 y.o. MarriedCaucasian female presents for evaluation of right enlarged axillary lymph node. Onset of the symptoms of and on for the past month and has had before about one year ago and resolved..Patient was to go back for follow up after last episode in 6 months but decreased in size and she felt not needed.  Patient sought evaluation because of enlarged right axillary lymph node center..  Contributing factors include no family history, but patient does shave underarms. Patient is also an insulin dependent diabetic with pump use.. Denies chills, fevers and night sweats. Patient denies hiistory of trauma, bites, or injuries.  Patient has felt increased fatigue with previous cold and stress with working at home. 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 Spouse having surgery soon on shoulder and she is concerned for his welfare. Good family support if needed. Daughter at [facility]. No other concerns today.   Review of Systems Pertinent items are noted in HPI.   Objective:   General appearance: alert, cooperative, appears stated age, fatigued and no distress, crying at times while discussing concerns Breasts: normal appearance, no masses or tenderness, No nipple retraction or dimpling, No nipple discharge or bleeding, no axillary enlargement on left, but right axillary lymph node in center of axilla enlarged and slightly tender, no skin redness. Cervical,submandibular,left axilla, inguinal lymph nodes, no enlargement noted   Assessment:   ASSESSMENT:Patient is diagnosed with normal breast exam. Right center axillary lymph node enlargement ? Reactive node with shaving? Previous occurrence, no sequelae Fatigue, history of Vitamin D Deficiency   Plan:   PLAN: Discussed finding of lymph node and need for evaluation with [place]. Also will draw lab CBC with diff to see if infection present. Also discussed depression with Vitamin D deficiency/ Lab: CBC with  DIff, Vitamin D Encouraged to seek help from family when needed. Use warm compresses to area under arm to see if resolves. Warning signs given. Patient will be scheduled for [place] prior to leaving.  Rv prn

## 2017-04-29 NOTE — Patient Instructions (Signed)
Lymphadenopathy Lymphadenopathy refers to swollen or enlarged lymph glands, also called lymph nodes. Lymph glands are part of your body's defense (immune) system, which protects the body from infections, germs, and diseases. Lymph glands are found in many locations in your body, including the neck, underarm, and groin. Many things can cause lymph glands to become enlarged. When your immune system responds to germs, such as viruses or bacteria, infection-fighting cells and fluid build up. This causes the glands to grow in size. Usually, this is not something to worry about. The swelling and any soreness often go away without treatment. However, swollen lymph glands can also be caused by a number of diseases. Your health care provider may do various tests to help determine the cause. If the cause of your swollen lymph glands cannot be found, it is important to monitor your condition to make sure the swelling goes away. Follow these instructions at home: Watch your condition for any changes. The following actions may help to lessen any discomfort you are feeling:  Get plenty of rest.  Take medicines only as directed by your health care provider. Your health care provider may recommend over-the-counter medicines for pain.  Apply moist heat compresses to the site of swollen lymph nodes as directed by your health care provider. This can help reduce any pain.  Check your lymph nodes daily for any changes.  Keep all follow-up visits as directed by your health care provider. This is important.  Contact a health care provider if:  Your lymph nodes are still swollen after 2 weeks.  Your swelling increases or spreads to other areas.  Your lymph nodes are hard, seem fixed to the skin, or are growing rapidly.  Your skin over the lymph nodes is red and inflamed.  You have a fever.  You have chills.  You have fatigue.  You develop a sore throat.  You have abdominal pain.  You have weight  loss.  You have night sweats. Get help right away if:  You notice fluid leaking from the area of the enlarged lymph node.  You have severe pain in any area of your body.  You have chest pain.  You have shortness of breath. This information is not intended to replace advice given to you by your health care provider. Make sure you discuss any questions you have with your health care provider. Document Released: 12/19/2007 Document Revised: 08/17/2015 Document Reviewed: 10/14/2013 Elsevier Interactive Patient Education  2018 Elsevier Inc.  

## 2017-04-30 ENCOUNTER — Other Ambulatory Visit: Payer: Self-pay | Admitting: Certified Nurse Midwife

## 2017-04-30 DIAGNOSIS — E559 Vitamin D deficiency, unspecified: Secondary | ICD-10-CM

## 2017-04-30 LAB — CBC WITH DIFFERENTIAL/PLATELET
Basophils Absolute: 0.1 10*3/uL (ref 0.0–0.2)
Basos: 1 %
EOS (ABSOLUTE): 0.1 10*3/uL (ref 0.0–0.4)
Eos: 1 %
Hematocrit: 46.1 % (ref 34.0–46.6)
Hemoglobin: 14.8 g/dL (ref 11.1–15.9)
IMMATURE GRANS (ABS): 0 10*3/uL (ref 0.0–0.1)
Immature Granulocytes: 0 %
Lymphocytes Absolute: 2.2 10*3/uL (ref 0.7–3.1)
Lymphs: 26 %
MCH: 28.9 pg (ref 26.6–33.0)
MCHC: 32.1 g/dL (ref 31.5–35.7)
MCV: 90 fL (ref 79–97)
MONOS ABS: 0.5 10*3/uL (ref 0.1–0.9)
Monocytes: 6 %
NEUTROS ABS: 5.8 10*3/uL (ref 1.4–7.0)
Neutrophils: 66 %
PLATELETS: 324 10*3/uL (ref 150–379)
RBC: 5.12 x10E6/uL (ref 3.77–5.28)
RDW: 13.8 % (ref 12.3–15.4)
WBC: 8.6 10*3/uL (ref 3.4–10.8)

## 2017-04-30 LAB — VITAMIN D 25 HYDROXY (VIT D DEFICIENCY, FRACTURES): Vit D, 25-Hydroxy: 23.8 ng/mL — ABNORMAL LOW (ref 30.0–100.0)

## 2017-05-01 ENCOUNTER — Other Ambulatory Visit: Payer: Self-pay | Admitting: Certified Nurse Midwife

## 2017-05-01 ENCOUNTER — Ambulatory Visit
Admission: RE | Admit: 2017-05-01 | Discharge: 2017-05-01 | Disposition: A | Discharge status: Home / Self Care | Payer: BLUE CROSS/BLUE SHIELD | Source: Ambulatory Visit | Attending: Certified Nurse Midwife | Admitting: Certified Nurse Midwife

## 2017-05-01 DIAGNOSIS — N6489 Other specified disorders of breast: Secondary | ICD-10-CM | POA: Diagnosis not present

## 2017-05-01 DIAGNOSIS — R599 Enlarged lymph nodes, unspecified: Secondary | ICD-10-CM

## 2017-05-05 ENCOUNTER — Ambulatory Visit
Admission: RE | Admit: 2017-05-05 | Discharge: 2017-05-05 | Disposition: A | Discharge status: Home / Self Care | Payer: BLUE CROSS/BLUE SHIELD | Source: Ambulatory Visit | Attending: Certified Nurse Midwife | Admitting: Certified Nurse Midwife

## 2017-05-05 DIAGNOSIS — C50911 Malignant neoplasm of unspecified site of right female breast: Secondary | ICD-10-CM | POA: Diagnosis not present

## 2017-05-05 DIAGNOSIS — R599 Enlarged lymph nodes, unspecified: Secondary | ICD-10-CM

## 2017-05-05 DIAGNOSIS — C773 Secondary and unspecified malignant neoplasm of axilla and upper limb lymph nodes: Secondary | ICD-10-CM | POA: Diagnosis not present

## 2017-05-05 DIAGNOSIS — R590 Localized enlarged lymph nodes: Secondary | ICD-10-CM | POA: Diagnosis not present

## 2017-05-07 ENCOUNTER — Telehealth: Payer: Self-pay | Admitting: Certified Nurse Midwife

## 2017-05-07 ENCOUNTER — Telehealth: Payer: Self-pay | Admitting: *Deleted

## 2017-05-07 DIAGNOSIS — L237 Allergic contact dermatitis due to plants, except food: Secondary | ICD-10-CM | POA: Diagnosis not present

## 2017-05-07 DIAGNOSIS — C50919 Malignant neoplasm of unspecified site of unspecified female breast: Secondary | ICD-10-CM

## 2017-05-07 NOTE — Telephone Encounter (Signed)
Noelle from [employer], was advised [facility] will do precert for MRI, will return call to update.   Call returned from [facility], was advised authorization received for bilateral breast MRI, will proceed with scheduling.   Routing to [employer].   Cc: Melvia Heaps, CNM, Dr. Talbert Nan

## 2017-05-07 NOTE — Telephone Encounter (Signed)
Phone call to patient regarding axillary lymph node biopsy report and breast cancer diagnosis. Patient home with spouse now, was alone when given information. She has MRI set up for tomorrow for location of cancer site. Has appt.with oncology team on 05/04/17. Emotionally appropriate on the phone. Questions addressed. Offered my support and presence as needed in making this journey easier for her. Spouse will be her tomorrow and family has been called for prayers. Patient appreciative of our support. Request prayers for support. Discussed will follow with MRI report once in and make sure she has appropriate care as needed within our realm. Encouraged to call with concerns.

## 2017-05-07 NOTE — Telephone Encounter (Signed)
Requesting order for bilateral breast MRI with and without contrast.   Biopsy right axilla  -metastatic breast carcinoma   Patient is scheduled for the [facility] on 05/14/17.   Request that Melvia Heaps, CNM contact patient directly today.   Laqueta Due will place order, will have business office do precert on 2/03. Sula Soda will have [facility] go ahead and hold spot for MRI, request to schedule MRI ASAP.   Routing to [place] D. For precert.   Cc: Melvia Heaps, CNM, Dr. Talbert Nan, Magdalene Patricia

## 2017-05-08 ENCOUNTER — Ambulatory Visit
Admission: RE | Admit: 2017-05-08 | Discharge: 2017-05-08 | Disposition: A | Discharge status: Home / Self Care | Payer: BLUE CROSS/BLUE SHIELD | Source: Ambulatory Visit | Attending: Certified Nurse Midwife | Admitting: Certified Nurse Midwife

## 2017-05-08 ENCOUNTER — Other Ambulatory Visit: Payer: Self-pay | Admitting: Obstetrics and Gynecology

## 2017-05-08 ENCOUNTER — Ambulatory Visit
Admission: RE | Admit: 2017-05-08 | Discharge: 2017-05-08 | Disposition: A | Discharge status: Home / Self Care | Payer: BLUE CROSS/BLUE SHIELD | Source: Ambulatory Visit | Attending: Obstetrics and Gynecology | Admitting: Obstetrics and Gynecology

## 2017-05-08 ENCOUNTER — Other Ambulatory Visit: Payer: Self-pay | Admitting: Certified Nurse Midwife

## 2017-05-08 DIAGNOSIS — C50919 Malignant neoplasm of unspecified site of unspecified female breast: Secondary | ICD-10-CM

## 2017-05-08 DIAGNOSIS — R922 Inconclusive mammogram: Secondary | ICD-10-CM | POA: Diagnosis not present

## 2017-05-08 DIAGNOSIS — C50911 Malignant neoplasm of unspecified site of right female breast: Secondary | ICD-10-CM | POA: Diagnosis not present

## 2017-05-08 DIAGNOSIS — R9389 Abnormal findings on diagnostic imaging of other specified body structures: Secondary | ICD-10-CM

## 2017-05-08 DIAGNOSIS — N6489 Other specified disorders of breast: Secondary | ICD-10-CM | POA: Diagnosis not present

## 2017-05-08 MED ORDER — GADOBENATE DIMEGLUMINE 529 MG/ML IV SOLN
15.0000 mL | Freq: Once | INTRAVENOUS | Status: AC | PRN
  Administered 2017-05-08: 15 mL via INTRAVENOUS

## 2017-05-09 ENCOUNTER — Ambulatory Visit
Admission: RE | Admit: 2017-05-09 | Discharge: 2017-05-09 | Disposition: A | Discharge status: Home / Self Care | Payer: BLUE CROSS/BLUE SHIELD | Source: Ambulatory Visit | Attending: Obstetrics and Gynecology | Admitting: Obstetrics and Gynecology

## 2017-05-09 ENCOUNTER — Other Ambulatory Visit: Payer: Self-pay | Admitting: Obstetrics and Gynecology

## 2017-05-09 ENCOUNTER — Encounter: Payer: Self-pay | Admitting: *Deleted

## 2017-05-09 DIAGNOSIS — N6310 Unspecified lump in the right breast, unspecified quadrant: Secondary | ICD-10-CM

## 2017-05-09 DIAGNOSIS — R9389 Abnormal findings on diagnostic imaging of other specified body structures: Secondary | ICD-10-CM

## 2017-05-09 DIAGNOSIS — N6311 Unspecified lump in the right breast, upper outer quadrant: Secondary | ICD-10-CM | POA: Diagnosis not present

## 2017-05-09 DIAGNOSIS — Z170 Estrogen receptor positive status [ER+]: Secondary | ICD-10-CM

## 2017-05-09 DIAGNOSIS — C50411 Malignant neoplasm of upper-outer quadrant of right female breast: Secondary | ICD-10-CM | POA: Diagnosis not present

## 2017-05-09 DIAGNOSIS — N6489 Other specified disorders of breast: Secondary | ICD-10-CM | POA: Diagnosis not present

## 2017-05-09 DIAGNOSIS — C50611 Malignant neoplasm of axillary tail of right female breast: Secondary | ICD-10-CM | POA: Insufficient documentation

## 2017-05-14 ENCOUNTER — Ambulatory Visit
Admission: RE | Admit: 2017-05-14 | Discharge: 2017-05-14 | Disposition: A | Discharge status: Home / Self Care | Payer: BLUE CROSS/BLUE SHIELD | Source: Ambulatory Visit | Attending: Radiation Oncology | Admitting: Radiation Oncology

## 2017-05-14 ENCOUNTER — Encounter: Payer: Self-pay | Admitting: Hematology and Oncology

## 2017-05-14 ENCOUNTER — Inpatient Hospital Stay: Payer: BLUE CROSS/BLUE SHIELD | Attending: Hematology and Oncology | Admitting: Hematology and Oncology

## 2017-05-14 ENCOUNTER — Ambulatory Visit: Payer: BLUE CROSS/BLUE SHIELD | Attending: General Surgery | Admitting: Physical Therapy

## 2017-05-14 ENCOUNTER — Telehealth: Payer: Self-pay | Admitting: *Deleted

## 2017-05-14 ENCOUNTER — Encounter: Payer: Self-pay | Admitting: Physical Therapy

## 2017-05-14 ENCOUNTER — Encounter: Payer: Self-pay | Admitting: Radiation Oncology

## 2017-05-14 ENCOUNTER — Inpatient Hospital Stay: Payer: BLUE CROSS/BLUE SHIELD

## 2017-05-14 ENCOUNTER — Other Ambulatory Visit: Payer: Self-pay

## 2017-05-14 VITALS — BP 164/94 | HR 76 | Temp 98.4°F | Resp 18 | Ht 62.75 in | Wt 171.1 lb

## 2017-05-14 DIAGNOSIS — Z170 Estrogen receptor positive status [ER+]: Secondary | ICD-10-CM | POA: Diagnosis not present

## 2017-05-14 DIAGNOSIS — C50611 Malignant neoplasm of axillary tail of right female breast: Secondary | ICD-10-CM

## 2017-05-14 DIAGNOSIS — C773 Secondary and unspecified malignant neoplasm of axilla and upper limb lymph nodes: Secondary | ICD-10-CM | POA: Diagnosis not present

## 2017-05-14 DIAGNOSIS — C50411 Malignant neoplasm of upper-outer quadrant of right female breast: Secondary | ICD-10-CM | POA: Diagnosis not present

## 2017-05-14 DIAGNOSIS — Z87891 Personal history of nicotine dependence: Secondary | ICD-10-CM | POA: Insufficient documentation

## 2017-05-14 DIAGNOSIS — R293 Abnormal posture: Secondary | ICD-10-CM | POA: Diagnosis not present

## 2017-05-14 DIAGNOSIS — C50919 Malignant neoplasm of unspecified site of unspecified female breast: Secondary | ICD-10-CM | POA: Diagnosis not present

## 2017-05-14 LAB — CMP (CANCER CENTER ONLY)
ALT: 10 U/L (ref 0–55)
AST: 15 U/L (ref 5–34)
Albumin: 3.4 g/dL — ABNORMAL LOW (ref 3.5–5.0)
Alkaline Phosphatase: 81 U/L (ref 40–150)
Anion gap: 9 (ref 3–11)
BILIRUBIN TOTAL: 0.8 mg/dL (ref 0.2–1.2)
BUN: 9 mg/dL (ref 7–26)
CO2: 26 mmol/L (ref 22–29)
CREATININE: 1.07 mg/dL (ref 0.60–1.10)
Calcium: 8.9 mg/dL (ref 8.4–10.4)
Chloride: 104 mmol/L (ref 98–109)
GFR, Estimated: >60 mL/min (ref >60)
Glucose, Bld: 187 mg/dL — ABNORMAL HIGH (ref 70–140)
POTASSIUM: 4 mmol/L (ref 3.5–5.1)
Sodium: 139 mmol/L (ref 136–145)
TOTAL PROTEIN: 7 g/dL (ref 6.4–8.3)

## 2017-05-14 LAB — CBC WITH DIFFERENTIAL (CANCER CENTER ONLY)
BASOS ABS: 0.1 10*3/uL (ref 0.0–0.1)
Basophils Relative: 1 %
EOS ABS: 0.1 10*3/uL (ref 0.0–0.5)
EOS PCT: 1 %
HCT: 42.7 % (ref 34.8–46.6)
HEMOGLOBIN: 14.1 g/dL (ref 11.6–15.9)
LYMPHS ABS: 1.6 10*3/uL (ref 0.9–3.3)
Lymphocytes Relative: 21 %
MCH: 28.8 pg (ref 25.1–34.0)
MCHC: 33.1 g/dL (ref 31.5–36.0)
MCV: 87.2 fL (ref 79.5–101.0)
Monocytes Absolute: 0.5 10*3/uL (ref 0.1–0.9)
Monocytes Relative: 6 %
NEUTROS PCT: 71 %
Neutro Abs: 5.5 10*3/uL (ref 1.5–6.5)
PLATELETS: 272 10*3/uL (ref 145–400)
RBC: 4.9 MIL/uL (ref 3.70–5.45)
RDW: 13.9 % (ref 11.2–14.5)
WBC: 7.8 10*3/uL (ref 3.9–10.3)

## 2017-05-14 MED ORDER — TAMOXIFEN CITRATE 20 MG PO TABS: 20.0000 mg | ORAL_TABLET | Freq: Every day | ORAL | 1 refills | Status: DC

## 2017-05-14 NOTE — Progress Notes (Signed)
Nutrition Assessment  Reason for Assessment:  Pt seen in [facility]  ASSESSMENT:   42 year old female with new diagnosis of breast cancer.  Past medical history of DM with insulin pump.    Patient with normal appetite, has been decreased recently due to new diagnosis.  Medications:  Insulin pump  Labs: reviewed  Anthropometrics:   Height: 62.75 inches Weight: 171 lb 1.6 oz BMI: 30   NUTRITION DIAGNOSIS: Food and nutrition related knowledge deficit related to new diagnosis of breast cancer as evidenced by no prior need for nutrition related information.  INTERVENTION:   Discussed and provided packet of information regarding nutritional tips for breast cancer patients.  Questions answered.  Teachback method used.  Contact information provided and patient knows to contact me with questions/concerns.    MONITORING, EVALUATION, and GOAL: Pt will consume a healthy plant based diet to maintain lean body mass throughout treatment.   Alanya Vukelich B. Zenia Resides, [place], [place] Registered Dietitian [phone] (pager)

## 2017-05-14 NOTE — Patient Instructions (Signed)

## 2017-05-14 NOTE — Progress Notes (Signed)
Radiation Oncology         [phone] ________________________________  Initial outpatient Consultation  Name: Stephanie Holden MRN: [id number]  Date: 05/14/2017  DOB: 1976/03/09  BU:LAGTXMI, Fransico Him, MD  [name], MD   REFERRING PHYSICIAN: [name], MD  DIAGNOSIS:    ICD-10-CM   1. Malignant neoplasm of axillary tail of right breast in female, estrogen receptor positive ([place]) C50.611    Z17.0    Stage clinical T1c N1 M0 Right Breast LOQ Invasive Ductal Carcinoma with DCIS, ER+ / PR+ / Her2-, Grade 1 Cancer Staging Malignant neoplasm of axillary tail of right breast in female, estrogen receptor positive ([place]) Staging form: Breast, AJCC 8th Edition - Clinical stage from 05/14/2017: Stage IB (cT1c, cN1, cM0, G1, ER: Positive, PR: Positive, HER2: Negative) - Unsigned   CHIEF COMPLAINT: Here to discuss management of right breast cancer  HISTORY OF PRESENT ILLNESS::Stephanie Holden is a 42 y.o. female who had a history of painful right axillary mass in May 2018. The patient had an ultrasound on 07/31/16 that described a morphologically abnormal lymph node in the right axilla. She decided not to follow-up further at that time and the lymph node was no longer palpable after the initial examination. She returned for a follow-up examination on 05/01/17. At that time the patient reported the node had become palpable again. Ultrasound showed a morphologically abnormal lymph node within the right axilla, corresponding to the palpable area of concern. Biopsy of the right axillary node on 05/05/17 showed metastatic breast carcinoma of the right axillary node with characteristics as described above in the diagnosis. Bilateral mammogram on 05/08/17 showed recently biopsied lymph node in the right axilla demonstrating metastatic breast cancer; however, there are no mammographic findings of malignancy in either breast (both of which were extremely dense). Bilateral breast MRI on 05/08/17 showed an  indeterminate oval enhancing mass over the outer lower quadrant of the right breast measuring 0.9 x 0.9 x 1.4 cm. There was also a group of indeterminate small oval enhancing masses over the 9-10:00 position of the right breast, 7.6 cm from the nipple, measuring 0.9 x 1.5 x 2.1 cm altogether. Ultrasound of the right breast on 05/09/17 showed several irregular hypoechoic masses in the 9:30-10:00 o'clock position in a segmental distribution correspond to small rim-enhancing masses clustered in the lateral right breast on recent breast MRI. These masses are suspicious for malignancy. Biopsy on 05/09/17 in  two sites of the right breast showed  invasive ductal carcinoma, DCIS, lymphovascular invasion is identified of the right breast in both locations. Patient presents to multidisciplinary clinic to discuss the role of radiation therapy as part of her disease management. R axilla Node bx was also positive.  Receptors  In all 3 bx's: ER+ PR+ HER2 neg.  Patient is positive for glasses, contacts, breast lump, anxiety, diabetes, and swollen lymph glands.  PREVIOUS RADIATION THERAPY: No  PAST MEDICAL HISTORY:  has a past medical history of Insulin dependent type 1 diabetes mellitus ([place]), Insulin pump in place, and Stenosing tenosynovitis of thumb (06/2014).    PAST SURGICAL HISTORY: Past Surgical History:  Procedure Laterality Date  . CARPAL TUNNEL RELEASE Bilateral 10/28/2013   Procedure: BILATERAL CARPAL TUNNEL RELEASE;  Surgeon: Daryll Brod, MD;  Location: [place];  Service: Orthopedics;  Laterality: Bilateral;  . CESAREAN SECTION  2000; 08/20/2001  . ESSURE TUBAL LIGATION  2005   failed, tubal puncture  . LAPAROSCOPIC TUBAL LIGATION  04/02/2007   removal Essure  .  LAPAROSCOPIC UNILATERAL SALPINGECTOMY  2005  . TRIGGER FINGER RELEASE Left 04/12/2014   Procedure: RELEASE A-1 PULLEY LEFT THUMB;  Surgeon: Daryll Brod, MD;  Location: [place];  Service: Orthopedics;  Laterality:  Left;  . TRIGGER FINGER RELEASE Right 07/19/2014   Procedure: RELEASE TRIGGER FINGER/A-1 PULLEY RIGHT THUMB;  Surgeon: Daryll Brod, MD;  Location: [place];  Service: Orthopedics;  Laterality: Right;    FAMILY HISTORY:  No cancer reported in family  SOCIAL HISTORY:  reports that she quit smoking about 16 years ago. she has never used smokeless tobacco. She reports that she drinks about 0.6 oz of alcohol per week. She reports that she does not use drugs.  ALLERGIES: Dilaudid [hydromorphone hcl]  MEDICATIONS:  Current Outpatient Medications  Medication Sig Dispense Refill  . benazepril (LOTENSIN) 10 MG tablet Take 10 mg by mouth daily. For kidney    . Insulin Aspart (NOVOLOG [place]) Inject into the skin.    . Multiple Vitamin (MULTIVITAMIN) tablet Take 1 tablet by mouth daily.    . naproxen sodium (ANAPROX) 220 MG tablet Take 220 mg by mouth 2 (two) times daily with a meal.    . ONE TOUCH ULTRA TEST test strip PT TEST SUGARS 6 TIMES DAILY ON PUMP DX: E10.9  8  . Probiotic Product (PROBIOTIC DAILY PO) Take by mouth.    . tamoxifen (NOLVADEX) 20 MG tablet Take 1 tablet (20 mg total) by mouth daily. 30 tablet 1   No current facility-administered medications for this encounter.     REVIEW OF SYSTEMS: A 10+ POINT REVIEW OF SYSTEMS WAS OBTAINED including neurology, dermatology, psychiatry, cardiac, respiratory, lymph, extremities, GI, GU, Musculoskeletal, constitutional, breasts, reproductive, HEENT.  All pertinent positives are noted in the HPI.  All others are negative.   PHYSICAL EXAM:     General: Alert and oriented, in no acute distress HEENT: Head is normocephalic. Extraocular movements are intact. Oropharynx is clear. Neck: Neck is supple, palpable mass on the right thyroid, about 1 cm in size (PATIENT REPORTS BENIGN BX). No other palpable cervical or supraclavicular lymphadenopathy. Heart: Regular in rate and rhythm with no murmurs, rubs, or gallops. Chest: Clear to  auscultation bilaterally, with no rhonchi, wheezes, or rales. Abdomen: Soft, nontender, nondistended, with no rigidity or guarding. Extremities: No cyanosis or edema. Lymphatics: see Neck Exam Skin: No concerning lesions. Musculoskeletal: symmetric strength and muscle tone throughout. Neurologic: Cranial nerves II through XII are grossly intact. No obvious focalities. Speech is fluent. Coordination is intact. Psychiatric: Judgment and insight are intact. Affect is appropriate. Breasts: Bruising in UOQ of the right breast. 2 cm of thickening under the biopsy bruise in the upper outer quadrant. 1.5-2 cm palpable node in the right axilla. No other palpable masses appreciated in breasts or axillae.   ECOG = 0  0 - Asymptomatic (Fully active, able to carry on all predisease activities without restriction)  1 - Symptomatic but completely ambulatory (Restricted in physically strenuous activity but ambulatory and able to carry out work of a light or sedentary nature. For example, light housework, office work)  2 - Symptomatic, <50% in bed during the day (Ambulatory and capable of all self care but unable to carry out any work activities. Up and about more than 50% of waking hours)  3 - Symptomatic, >50% in bed, but not bedbound (Capable of only limited self-care, confined to bed or chair 50% or more of waking hours)  4 - Bedbound (Completely disabled. Cannot carry on any self-care.  Totally confined to bed or chair)  5 - Death   Eustace Pen MM, Creech RH, Tormey DC, et al. [phone]). "Toxicity and response criteria of the [facility] Group". [place] Oncol. 5 (6): 649-55   LABORATORY DATA:  Lab Results  Component Value Date   WBC 7.8 05/14/2017   HGB 14.8 04/29/2017   HCT 42.7 05/14/2017   MCV 87.2 05/14/2017   PLT 272 05/14/2017   CMP     Component Value Date/Time   NA 139 05/14/2017 1214   K 4.0 05/14/2017 1214   CL 104 05/14/2017 1214   CO2 26 05/14/2017 1214    GLUCOSE 187 (H) 05/14/2017 1214   BUN 9 05/14/2017 1214   CREATININE 1.07 05/14/2017 1214   CALCIUM 8.9 05/14/2017 1214   PROT 7.0 05/14/2017 1214   ALBUMIN 3.4 (L) 05/14/2017 1214   AST 15 05/14/2017 1214   ALT 10 05/14/2017 1214   ALKPHOS 81 05/14/2017 1214   BILITOT 0.8 05/14/2017 1214   GFRNONAA >60 05/14/2017 1214   GFRAA >60 05/14/2017 1214         RADIOGRAPHY: Mr Breast Bilateral W Wo Contrast Inc Cad  Addendum Date: 05/09/2017   ADDENDUM REPORT: 05/09/2017 11:42 ADDENDUM: Note that the group of oval enhancing right breast masses described at the 2-3:00 position are actually over the upper outer breast at the 9-10:00 position. Electronically Signed   By: Marin Olp M.D.   On: 05/09/2017 11:42   Result Date: 05/09/2017 CLINICAL DATA:  Recently diagnosed metastatic breast cancer to right axillary lymph node. Bilateral diagnostic mammogram today demonstrates no definite focal abnormality in either breast. LABS:  Creatinine was obtained on site at [facility] at [address] Results: Creatinine 0.7 mg/dL, BUN 8 and GFR 108. EXAM: BILATERAL BREAST MRI WITH AND WITHOUT CONTRAST TECHNIQUE: Multiplanar, multisequence MR images of both breasts were obtained prior to and following the intravenous administration of 15 ml of MultiHance. THREE-DIMENSIONAL MR IMAGE RENDERING ON INDEPENDENT WORKSTATION: Three-dimensional MR images were rendered by post-processing of the original MR data on an independent workstation. The three-dimensional MR images were interpreted, and findings are reported in the following complete MRI report for this study. Three dimensional images were evaluated at the independent DynaCad workstation COMPARISON:  Previous exam(s). FINDINGS: Breast composition: c. Heterogeneous fibroglandular tissue. Background parenchymal enhancement: Moderate. Right breast: Exam demonstrates a group of small oval enhancing masses over the approximate 2-3:00 position 7.6 cm from the  nipple. These small masses begin on image number 106 of series 7 and extend to image number 125. They measure altogether 0.9 x 1.5 x 2.1 cm in transverse, AP and craniocaudal dimension. The largest mass in this group measures 8 mm in greatest dimension. There is an oval enhancing mass with irregular borders over the outer lower quadrant of the right breast 4.6 cm from the nipple measuring 0.9 x 0.9 x 1.4 cm. Left breast: No mass or abnormal enhancement. Lymph nodes: Abnormal right axillary lymph node containing clip artifact compatible with biopsy-proven metastatic nodal disease. Somewhat suspicious 6 mm lymph node immediately anterior to the biopsy-proven metastatic lymph node. No significant left axillary adenopathy. Ancillary findings:  None. IMPRESSION: Indeterminate oval enhancing mass over the outer lower quadrant of the right breast measuring 0.9 x 0.9 x 1.4 cm. Group of indeterminate small oval enhancing masses over the 2-3:00 position of the right breast 7.6 cm from the nipple measuring 0.9 x 1.5 x 2.1 cm altogether. Biopsy-proven metastatic right axillary lymph node containing  clip artifact with 6 mm with immediately anterior slightly suspicious 6 mm node. No suspicious findings in the left breast or axilla. RECOMMENDATION: Recommend second-look ultrasound of the right breast at the 2-3:00 position and outer lower quadrant. If apparent sonographically, recommend ultrasound-guided core needle biopsy of both areas. If not apparent sonographically, recommend MRI guided biopsy of both areas at the 2-3:00 position and mass over the outer lower quadrant. BI-RADS CATEGORY  0: Incomplete. Need additional imaging evaluation and/or prior mammograms for comparison. Electronically Signed: By: Marin Olp M.D. On: 05/08/2017 14:22   US Breast Ltd Uni Left Inc Axilla  Result Date: 05/08/2017 CLINICAL DATA:  42 year old female with a recent morphologically abnormal lymph node in the right axilla biopsied with  pathology results returning as metastatic breast cancer. Patient presents today for bilateral mammograms as most recent prior mammograms were performed May 2018. A bilateral breast MRI is scheduled for later this morning following patient's mammogram. EXAM: DIGITAL DIAGNOSTIC BILATERAL MAMMOGRAM WITH CAD AND TOMO LEFT BREAST ULTRASOUND COMPARISON:  Previous exam(s). ACR Breast Density Category d: The breast tissue is extremely dense, which lowers the sensitivity of mammography. FINDINGS: No definite suspicious masses or calcifications are seen in either breast. The recently biopsied morphologically abnormal lymph node in the right axilla containing biopsy marking clip is partially visualized. No definite suspicious abnormalities identified in the right breast. An asymmetry is present in the posterior left breast felt to be related to focal fibroglandular tissue and overall similar in appearance when compared to older mammograms dated 04/09/2013, however given extremely dense fibroglandular tissue ultrasound of the left breast will be performed. Mammographic images were processed with CAD. Physical examination of the central left breast does not reveal any palpable masses. The bilateral breasts are symmetric in appearance. Sonographic evaluation of the entire central left breast was performed. Only dense fibroglandular tissue is visualized. IMPRESSION: Recently biopsied morphologically abnormal lymph node in the right axilla demonstrating metastatic breast cancer, however there are no mammographic findings of malignancy in either breast (both of which are extremely dense). RECOMMENDATION: Treatment plan for known malignancy with bilateral breast MRI scheduled later today. I have discussed the findings and recommendations with the patient. Results were also provided in writing at the conclusion of the visit. If applicable, a reminder letter will be sent to the patient regarding the next appointment. BI-RADS CATEGORY   6: Known biopsy-proven malignancy. Electronically Signed   By: Everlean Alstrom M.D.   On: 05/08/2017 10:37   US Breast Ltd Uni Right Inc Axilla  Result Date: 05/09/2017 CLINICAL DATA:  42 year old patient was recently diagnosed with metastatic breast carcinoma to a right axillary lymph node. No suspicious findings were identified in the right breast on mammography. A breast MRI was performed, demonstrating small irregular rim-enhancing masses in the outer right breast in the 9-10 o'clock position. Please note that the original dictation of the breast MRI had a typographical error on the o'clock position of these right breast masses; I confirmed the areas of suspicion with Dr. Derrel Nip today. Additionally, a mass in the lower outer quadrant of the right breast was described on breast MRI. Second-look ultrasound was recommended of both of these areas in the outer right breast. EXAM: ULTRASOUND OF THE RIGHT BREAST COMPARISON:  Bilateral breast MRI May 08, 2017 and recent diagnostic bilateral mammogram, right axillary ultrasound right axillary lymph node biopsy. FINDINGS: On physical exam, there is a healing cutaneous biopsy entry site in the low right axilla/axillary tail of the right breast. Targeted ultrasound  is performed, showing multiple slightly hypoechoic irregular nodules clustered in the 9:30 to 10:00 position of the right breast spanning approximately 4-8 cm from the nipple. The largest of the individual masses is 0.9 x 0.7 x 0.9 cm. Approximately 4 small hypoechoic masses are seen in total on ultrasound. Ultrasound of the lower outer quadrant of the right breast does not show a suspicious mass. IMPRESSION: Several irregular hypoechoic masses in the 930-10 o'clock position in a segmental distribution correspond to small rim-enhancing masses clustered in the lateral right breast on recent breast MRI. These masses are suspicious for malignancy. RECOMMENDATION: Ultrasound-guided biopsy of 2 of the right  breast masses in the upper-outer quadrant of the right breast is recommended and will be performed today. Depending on pathology results, and as needed for surgical planning, an MRI-guided biopsy of the enhancing mass in the lower outer quadrant of the right breast could be performed. I have discussed the findings and recommendations with the patient. Results were also provided in writing at the conclusion of the visit. If applicable, a reminder letter will be sent to the patient regarding the next appointment. BI-RADS CATEGORY  5: Highly suggestive of malignancy. Electronically Signed   By: Curlene Dolphin M.D.   On: 05/09/2017 14:51   Mm Diag Breast Tomo Bilateral  Result Date: 05/08/2017 CLINICAL DATA:  42 year old female with a recent morphologically abnormal lymph node in the right axilla biopsied with pathology results returning as metastatic breast cancer. Patient presents today for bilateral mammograms as most recent prior mammograms were performed May 2018. A bilateral breast MRI is scheduled for later this morning following patient's mammogram. EXAM: DIGITAL DIAGNOSTIC BILATERAL MAMMOGRAM WITH CAD AND TOMO LEFT BREAST ULTRASOUND COMPARISON:  Previous exam(s). ACR Breast Density Category d: The breast tissue is extremely dense, which lowers the sensitivity of mammography. FINDINGS: No definite suspicious masses or calcifications are seen in either breast. The recently biopsied morphologically abnormal lymph node in the right axilla containing biopsy marking clip is partially visualized. No definite suspicious abnormalities identified in the right breast. An asymmetry is present in the posterior left breast felt to be related to focal fibroglandular tissue and overall similar in appearance when compared to older mammograms dated 04/09/2013, however given extremely dense fibroglandular tissue ultrasound of the left breast will be performed. Mammographic images were processed with CAD. Physical examination  of the central left breast does not reveal any palpable masses. The bilateral breasts are symmetric in appearance. Sonographic evaluation of the entire central left breast was performed. Only dense fibroglandular tissue is visualized. IMPRESSION: Recently biopsied morphologically abnormal lymph node in the right axilla demonstrating metastatic breast cancer, however there are no mammographic findings of malignancy in either breast (both of which are extremely dense). RECOMMENDATION: Treatment plan for known malignancy with bilateral breast MRI scheduled later today. I have discussed the findings and recommendations with the patient. Results were also provided in writing at the conclusion of the visit. If applicable, a reminder letter will be sent to the patient regarding the next appointment. BI-RADS CATEGORY  6: Known biopsy-proven malignancy. Electronically Signed   By: Everlean Alstrom M.D.   On: 05/08/2017 10:37   [place] Axillary Node Core Biopsy Right  Addendum Date: 05/08/2017   ADDENDUM REPORT: 05/08/2017 07:24 ADDENDUM: Pathology revealed METASTATIC BREAST CARCINOMA of the Right axillary node. This was found to be concordant by Dr. Ammie Ferrier. Pathology results were discussed with the patient by telephone. The patient reported doing well after the biopsy with tenderness at the  site. Post biopsy instructions and care were reviewed and questions were answered. The patient was encouraged to call [facility] for any additional concerns. The patient was referred to [facility] on May 14, 2017. The patient is scheduled for a bilateral breast MRI on May 08, 2017 to assess for the source of the metastatic breast cancer and for further evaluation of extent of disease. Pathology results reported by Terie Purser, RN on 05/08/2017. Electronically Signed   By: Ammie Ferrier M.D.   On: 05/08/2017  07:24   Result Date: 05/08/2017 CLINICAL DATA:  42 year old female presenting for ultrasound-guided biopsy of a right axillary lymph node. EXAM: [place] AXILLARY NODE CORE BIOPSY RIGHT COMPARISON:  Previous exam(s). FINDINGS: I met with the patient and we discussed the procedure of ultrasound-guided biopsy, including benefits and alternatives. We discussed the high likelihood of a successful procedure. We discussed the risks of the procedure, including infection, bleeding, tissue injury, clip migration, and inadequate sampling. Informed written consent was given. The usual time-out protocol was performed immediately prior to the procedure. Using sterile technique and 1% Lidocaine as local anesthetic, under direct ultrasound visualization, a 14 gauge spring-loaded device was used to perform biopsy of a lymph node in the right axilla using an inferior approach. At the conclusion of the procedure a HydroMARK tissue marker clip was deployed into the biopsy cavity. IMPRESSION: Ultrasound guided biopsy of right axillary lymph node. No apparent complications. Electronically Signed: By: Ammie Ferrier M.D. On: 05/05/2017 13:32   [place] Axilla Right  Result Date: 05/01/2017 CLINICAL DATA:  Diagnostic ultrasound report of 07/31/2016 described a morphologically abnormal lymph node in the right axilla for which a 6 week follow-up ultrasound examination was recommended. Patient returns today for the follow-up diagnostic examination. Patient states that the lymph node was no longer palpable after the initial ultrasound examination, but is now again palpable. EXAM: ULTRASOUND OF THE RIGHT AXILLA COMPARISON:  Diagnostic mammogram and ultrasound dated 07/31/2016. FINDINGS: Ultrasound is performed, showing a persistent morphologically abnormal lymph node in the right axilla, with cortical thickness measuring up to approximately 7 mm. There is an eccentric nodular thickening at the pole of the lymph node, measuring 7 mm thickness,  versus an additional abutting slightly smaller lymph node, favor the former based on real-time ultrasound evaluation. No additional enlarged or morphologically abnormal lymph nodes are identified within the right axilla. IMPRESSION: Morphologically abnormal lymph node within the right axilla, corresponding to the palpable area of concern. Ultrasound-guided biopsy is recommended to ensure benignity. RECOMMENDATION: Ultrasound-guided biopsy of the morphologically abnormal lymph node in the right axilla. Ultrasound-guided biopsy is scheduled for February 11th. I have discussed the findings and recommendations with the patient. Results were also provided in writing at the conclusion of the visit. If applicable, a reminder letter will be sent to the patient regarding the next appointment. BI-RADS CATEGORY  4: Suspicious. Electronically Signed   By: Franki Cabot M.D.   On: 05/01/2017 13:34   Mm Clip Placement Right  Result Date: 05/09/2017 CLINICAL DATA:  Two right breast ultrasound-guided biopsies were performed today of 2 masses in a segmental distribution in the upper-outer quadrant. The patient has known metastatic breast carcinoma to right axillary lymph node. EXAM: DIAGNOSTIC RIGHT MAMMOGRAM POST ULTRASOUND BIOPSY COMPARISON:  Previous exam(s). FINDINGS: Mammographic images were obtained following ultrasound guided biopsies of 2 masses in the upper-outer quadrant of the right breast. A ribbon shaped biopsy clip and  a coil shaped biopsy clip are seen in the expected location of the sonographically detected masses in the posterior third of the upper-outer quadrant of the right breast. These 2 clips are separated by 1.8 cm. The ribbon shaped biopsy clip is the more anterior of the 2 clips. IMPRESSION: Satisfactory position of ribbon and coil shaped biopsy clips in the upper-outer quadrant of the right breast, separated by 1.8 cm. Final Assessment: Post Procedure Mammograms for Marker Placement Electronically  Signed   By: Curlene Dolphin M.D.   On: 05/09/2017 15:11   [place] Rt Breast Bx W Loc Dev 1st Lesion Img Bx Spec US Guide  Addendum Date: 05/13/2017   ADDENDUM REPORT: 05/13/2017 09:02 ADDENDUM: Pathology revealed GRADE I INVASIVE DUCTAL CARCINOMA, DUCTAL CARCINOMA IN SITU, LYMPHOVASCULAR INVASION IS IDENTIFIED of the Right breast, both locations, 9:30 to 10:00 6 cmfn and 9:30 to 10:00 8 cmfn. This was found to be concordant by Dr. Curlene Dolphin. Pathology results were discussed with the patient by telephone. The patient reported doing well after the biopsies with tenderness at the sites. Post biopsy instructions and care were reviewed and questions were answered. The patient was encouraged to call [facility] for any additional concerns. The patient had a Right axillary lymph node biopsy on May 05, 2017 which revealed METASTATIC BREAST CARCINOMA. The patient was referred to [facility] on May 14, 2017. MRI biopsy of the indeterminate mass in the lower outer quadrant of the Right breast seen on MRI but not ultrasound would be recommended for extent of disease, if breast conservation is desired. Pathology results reported by Terie Purser, RN on 05/13/2017. Electronically Signed   By: Curlene Dolphin M.D.   On: 05/13/2017 09:02   Result Date: 05/13/2017 CLINICAL DATA:  42 year old patient recently diagnosed with metastatic breast carcinoma to a suspicious right axillary lymph node. Breast MRI showed small clustered enhancing masses in the outer right breast and second-look ultrasound performed today showed a few clustered suspicious masses in the 930-10 o'clock position of the right breast. Two of these masses are being biopsied today. This report describes the biopsy of the mass at 9:30 to 10:00 position 6 cm from the nipple. EXAM: ULTRASOUND GUIDED RIGHT BREAST CORE NEEDLE BIOPSY COMPARISON:  Previous  exam(s). FINDINGS: I met with the patient and we discussed the procedure of ultrasound-guided biopsy, including benefits and alternatives. We discussed the high likelihood of a successful procedure. We discussed the risks of the procedure, including infection, bleeding, tissue injury, clip migration, and inadequate sampling. Informed written consent was given. The usual time-out protocol was performed immediately prior to the procedure. Lesion quadrant: Upper outer quadrant Using sterile technique and 1% Lidocaine as local anesthetic, under direct ultrasound visualization, a 12 gauge spring-loaded device was used to perform biopsy of an irregular hypoechoic mass at 9:30 to 10:00 position 6 cm from the nipple using a lateral approach. At the conclusion of the procedure a ribbon shaped tissue marker clip was deployed into the biopsy cavity. Follow up 2 view mammogram was performed and dictated separately. IMPRESSION: Ultrasound guided biopsy of the right breast mass at 9:30 to 10:00 position 6 cm from the nipple. No apparent complications. Electronically Signed: By: Curlene Dolphin M.D. On: 05/09/2017 14:55   [place] Rt Breast Bx W Loc Dev Ea Add Lesion Img Bx Spec US Guide  Addendum Date: 05/13/2017   ADDENDUM REPORT: 05/13/2017 09:01 ADDENDUM: Pathology revealed GRADE I  INVASIVE DUCTAL CARCINOMA, DUCTAL CARCINOMA IN SITU, LYMPHOVASCULAR INVASION IS IDENTIFIED of the Right breast, both locations, 9:30 to 10:00 6 cmfn and 9:30 to 10:00 8 cmfn. This was found to be concordant by Dr. Curlene Dolphin. Pathology results were discussed with the patient by telephone. The patient reported doing well after the biopsies with tenderness at the sites. Post biopsy instructions and care were reviewed and questions were answered. The patient was encouraged to call [facility] for any additional concerns. The patient had a Right axillary lymph node biopsy on May 05, 2017 which revealed METASTATIC BREAST  CARCINOMA. The patient was referred to [facility] on May 14, 2017. MRI biopsy of the indeterminate mass in the lower outer quadrant of the Right breast seen on MRI but not ultrasound would be recommended for extent of disease, if breast conservation is desired. Pathology results reported by Terie Purser, RN on 05/13/2017. Electronically Signed   By: Curlene Dolphin M.D.   On: 05/13/2017 09:01   Result Date: 05/13/2017 CLINICAL DATA:  42 year old patient recently diagnosed with metastatic breast carcinoma to a suspicious right axillary lymph node. Breast MRI showed small clustered enhancing masses in the outer right breast and second-look ultrasound performed today showed a few clustered suspicious masses in the 930-10 o'clock position of the right breast. Two of these masses are being biopsied today. This report describes the biopsy of the mass at 10:00 position 8 cm from the nipple. EXAM: ULTRASOUND GUIDED RIGHT BREAST CORE NEEDLE BIOPSY COMPARISON:  Previous exam(s). FINDINGS: I met with the patient and we discussed the procedure of ultrasound-guided biopsy, including benefits and alternatives. We discussed the high likelihood of a successful procedure. We discussed the risks of the procedure, including infection, bleeding, tissue injury, clip migration, and inadequate sampling. Informed written consent was given. The usual time-out protocol was performed immediately prior to the procedure. Lesion quadrant: Upper outer quadrant Using sterile technique and 1% Lidocaine as local anesthetic, under direct ultrasound visualization, a 12 gauge spring-loaded device was used to perform biopsy of a 0.5 cm hypoechoic irregular mass at 10 o'clock position 8 cm from the nipple using a lateral approach. At the conclusion of the procedure a coil tissue marker clip was deployed into the biopsy cavity. Follow up 2 view mammogram was performed and  dictated separately. IMPRESSION: Ultrasound guided biopsy of a right breast mass at 10 o'clock position 8 cm from the nipple. No apparent complications. Electronically Signed: By: Curlene Dolphin M.D. On: 05/09/2017 14:57      IMPRESSION/PLAN: Stage multicentric clinical T1c N1 Right Breast Invasive Ductal Carcinoma with DCIS, ER+ / PR+ / Her2-, Grade 1. Patient will undergo genetic evaluation and staging scans. She will be scheduled for bilateral mastectomy. She will be scheduled for Mammaprint to determine the role of chemotherapy. This will be followed by radiation therapy.  She will see plastic surgery to discuss reconstruction.  It was a pleasure meeting the patient today. We discussed the risks, benefits, and side effects of radiotherapy. I recommend radiotherapy to the right reconstructed breast and nodes to reduce her risk of locoregional recurrence by 2/3.  We discussed that radiation would take approximately 6 weeks to complete and that I would give the patient a few weeks to heal following surgery before starting treatment planning. If chemotherapy were to be given, this would precede radiotherapy. We spoke about acute effects including skin irritation and fatigue as well as  much less common late effects including internal organ injury or irritation. We discussed risk of lymphedema and capsular contracture. We spoke about the latest technology that is used to minimize the risk of late effects for patients undergoing radiotherapy to the breast or chest wall. No guarantees of treatment were given. The patient is enthusiastic about proceeding with treatment. I look forward to participating in the patient's care.  I will await her referral back to me for postoperative follow-up and eventual CT simulation/treatment planning.     __________________________________________   Eppie Gibson, MD   This document serves as a record of services personally performed by Eppie Gibson, MD. It was created on her  behalf by Bethann Humble, a trained medical scribe. The creation of this record is based on the scribe's personal observations and the provider's statements to them. This document has been checked and approved by the attending provider.

## 2017-05-14 NOTE — Progress Notes (Signed)
[facility] CONSULT NOTE  Patient Care Team: Tisovec, Fransico Him, MD as PCP - General (Internal Medicine)  CHIEF COMPLAINTS/PURPOSE OF CONSULTATION:  Newly diagnosed breast cancer  HISTORY OF PRESENTING ILLNESS:  Stephanie Holden 42 y.o. female is here because of recent diagnosis of right breast cancer.  Patient felt a lump in the right axilla which was initially evaluated in May 2018.  At that time she was recommended a 6-week follow-up but she did not follow-up as recommended.  She felt that the lump had decrease in size.  Subsequently recently she was noted to have an increase in the size of the lump and she was once again brought in for evaluation.  She underwent a repeat mammogram and ultrasound which showed multiple small masses in the breast and fairly large axillary lymph node.  She was presented this morning to the multidisciplinary tumor board and she is here today to discuss her treatment plan.  She is accompanied by her mother as well as her husband.  I reviewed her records extensively and collaborated the history with the patient.  SUMMARY OF ONCOLOGIC HISTORY:   Malignant neoplasm of axillary tail of right breast in female, estrogen receptor positive ([place])   05/09/2017 Initial Diagnosis    May 2018: Palpable right axillary mass.  The recommended a 6-week follow-up but apparently the mass decreased in size.  Recent increase in the mass was noted, biopsy revealed Grade 1 IDC with DCIS, lymphovascular invasion present, 4 small hypoechoic masses largest 0.9 cm, T1BN1 stage Ib clinical stage       MEDICAL HISTORY:  Past Medical History:  Diagnosis Date  . Insulin dependent type 1 diabetes mellitus ([place])   . Insulin pump in place   . Stenosing tenosynovitis of thumb 06/2014   right    SURGICAL HISTORY: Past Surgical History:  Procedure Laterality Date  . CARPAL TUNNEL RELEASE Bilateral 10/28/2013   Procedure: BILATERAL CARPAL TUNNEL RELEASE;  Surgeon: Daryll Brod, MD;   Location: [facility];  Service: Orthopedics;  Laterality: Bilateral;  . CESAREAN SECTION  2000; 08/20/2001  . ESSURE TUBAL LIGATION  2005   failed, tubal puncture  . LAPAROSCOPIC TUBAL LIGATION  04/02/2007   removal Essure  . LAPAROSCOPIC UNILATERAL SALPINGECTOMY  2005  . TRIGGER FINGER RELEASE Left 04/12/2014   Procedure: RELEASE A-1 PULLEY LEFT THUMB;  Surgeon: Daryll Brod, MD;  Location: [facility];  Service: Orthopedics;  Laterality: Left;  . TRIGGER FINGER RELEASE Right 07/19/2014   Procedure: RELEASE TRIGGER FINGER/A-1 PULLEY RIGHT THUMB;  Surgeon: Daryll Brod, MD;  Location: [facility];  Service: Orthopedics;  Laterality: Right;    SOCIAL HISTORY: Social History   Socioeconomic History  . Marital status: Married    Spouse name: Not on file  . Number of children: Not on file  . Years of education: Not on file  . Highest education level: Not on file  Social Needs  . Financial resource strain: Not on file  . Food insecurity - worry: Not on file  . Food insecurity - inability: Not on file  . Transportation needs - medical: Not on file  . Transportation needs - non-medical: Not on file  Occupational History  . Not on file  Tobacco Use  . Smoking status: Former Smoker    Last attempt to quit: 03/24/2001    Years since quitting: 16.1  . Smokeless tobacco: Never Used  Substance and Sexual Activity  . Alcohol use: Yes  Alcohol/week: 0.6 oz    Types: 1 Standard drinks or equivalent per week  . Drug use: No  . Sexual activity: Yes    Partners: Male    Birth control/protection: Surgical    Comment: tubal ligation & 1 tube removed  Other Topics Concern  . Not on file  Social History Narrative  . Not on file    FAMILY HISTORY: History reviewed. No pertinent family history.  ALLERGIES:  is allergic to dilaudid [hydromorphone hcl].  MEDICATIONS:  Current Outpatient Medications  Medication Sig Dispense Refill  . Insulin Aspart  (NOVOLOG [place]) Inject into the skin.    . ONE TOUCH ULTRA TEST test strip PT TEST SUGARS 6 TIMES DAILY ON PUMP DX: E10.9  8  . benazepril (LOTENSIN) 10 MG tablet Take 10 mg by mouth daily. For kidney    . Multiple Vitamin (MULTIVITAMIN) tablet Take 1 tablet by mouth daily.    . naproxen sodium (ANAPROX) 220 MG tablet Take 220 mg by mouth 2 (two) times daily with a meal.    . Probiotic Product (PROBIOTIC DAILY PO) Take by mouth.    . tamoxifen (NOLVADEX) 20 MG tablet Take 1 tablet (20 mg total) by mouth daily. 30 tablet 1   No current facility-administered medications for this visit.     REVIEW OF SYSTEMS:   Constitutional: Denies fevers, chills or abnormal night sweats Eyes: Denies blurriness of vision, double vision or watery eyes Ears, nose, mouth, throat, and face: Denies mucositis or sore throat Respiratory: Denies cough, dyspnea or wheezes Cardiovascular: Denies palpitation, chest discomfort or lower extremity swelling Gastrointestinal:  Denies nausea, heartburn or change in bowel habits Skin: Denies abnormal skin rashes Lymphatics: Denies new lymphadenopathy or easy bruising Neurological:Denies numbness, tingling or new weaknesses Behavioral/Psych: Mood is stable, no new changes  Breast: Right axillary mass All other systems were reviewed with the patient and are negative.  PHYSICAL EXAMINATION: ECOG PERFORMANCE STATUS: 1  Vitals:   05/14/17 1241  BP: (!) 164/94  Pulse: 76  Resp: 18  Temp: 98.4 F (36.9 C)  SpO2: 99%   Filed Weights   05/14/17 1241  Weight: 171 lb 1.6 oz (77.6 kg)    GENERAL:alert, no distress and comfortable SKIN: skin color, texture, turgor are normal, no rashes or significant lesions EYES: normal, conjunctiva are pink and non-injected, sclera clear OROPHARYNX:no exudate, no erythema and lips, buccal mucosa, and tongue normal  NECK: supple, thyroid normal size, non-tender, without nodularity LYMPH:  no palpable lymphadenopathy in the cervical,  axillary or inguinal LUNGS: clear to auscultation and percussion with normal breathing effort HEART: regular rate & rhythm and no murmurs and no lower extremity edema ABDOMEN:abdomen soft, non-tender and normal bowel sounds Musculoskeletal:no cyanosis of digits and no clubbing  PSYCH: alert & oriented x 3 with fluent speech NEURO: no focal motor/sensory deficits BREAST: Palpable right axillary lymph node (exam performed in the presence of a chaperone)   LABORATORY DATA:  I have reviewed the data as listed Lab Results  Component Value Date   WBC 7.8 05/14/2017   HGB 14.8 04/29/2017   HCT 42.7 05/14/2017   MCV 87.2 05/14/2017   PLT 272 05/14/2017   Lab Results  Component Value Date   NA 139 05/14/2017   K 4.0 05/14/2017   CL 104 05/14/2017   CO2 26 05/14/2017    RADIOGRAPHIC STUDIES: I have personally reviewed the radiological reports and agreed with the findings in the report.  ASSESSMENT AND PLAN:  Malignant neoplasm of axillary tail  of right breast in female, estrogen receptor positive ([place]) 05/09/2017: Diagnosed with right breast cancer May 2018: Palpable right axillary mass.  The recommended a 6-week follow-up but apparently the mass decreased in size.  Recent increase in the mass was noted, biopsy revealed Grade 1 IDC with DCIS, lymphovascular invasion present, 4 small hypoechoic masses largest 0.9 cm, T1BN1 stage Ib clinical stage  Pathology and radiology counseling: Discussed with the patient, the details of pathology including the type of breast cancer,the clinical staging, the significance of ER, PR and HER-2/neu receptors and the implications for treatment. After reviewing the pathology in detail, we proceeded to discuss the different treatment options between surgery, radiation, chemotherapy, antiestrogen therapies.  Recommendation: 1.  Mammaprint testing on the biopsy to determine if she would need chemotherapy.  If she is high risk then we will treated with  neoadjuvant chemotherapy 2. if she is low risk then she will proceed with bilateral mastectomies with axillary lymph node dissection and reconstruction. 3.  I started her on neoadjuvant tamoxifen today. We discussed the risks and benefits of tamoxifen. These include but not limited to insomnia, hot flashes, mood changes, vaginal dryness, and weight gain. Although rare, serious side effects including endometrial cancer, risk of blood clots were also discussed. We strongly believe that the benefits far outweigh the risks. Patient understands these risks and consented to starting treatment.  4.  Patient will undergo CT chest abdomen pelvis and bone scan for staging. 5.  After surgery, adjuvant radiation 6.  Followed by adjuvant antiestrogen therapy with tamoxifen times 10 years  Return to clinic based upon Mammaprint test results   All questions were answered. The patient knows to call the clinic with any problems, questions or concerns.    Harriette Ohara, MD 05/14/17

## 2017-05-14 NOTE — Telephone Encounter (Signed)
Received order for mammaprint testing on core bx. Requisition sent to [facility]

## 2017-05-14 NOTE — Assessment & Plan Note (Signed)
05/09/2017: Diagnosed with right breast cancer May 2018: Palpable right axillary mass.  The recommended a 6-week follow-up but apparently the mass decreased in size.  Recent increase in the mass was noted, biopsy revealed Grade 1 IDC with DCIS, lymphovascular invasion present, 4 small hypoechoic masses largest 0.9 cm, T1BN1 stage Ib clinical stage  Pathology and radiology counseling: Discussed with the patient, the details of pathology including the type of breast cancer,the clinical staging, the significance of ER, PR and HER-2/neu receptors and the implications for treatment. After reviewing the pathology in detail, we proceeded to discuss the different treatment options between surgery, radiation, chemotherapy, antiestrogen therapies.  Recommendation: 1.  Mammaprint testing on the biopsy to determine if she would need chemotherapy.  If she is high risk then we will treated with neoadjuvant chemotherapy 2. if she is low risk then she will proceed with bilateral mastectomies with axillary lymph node dissection and reconstruction. 3.  I started her on neoadjuvant tamoxifen today. We discussed the risks and benefits of tamoxifen. These include but not limited to insomnia, hot flashes, mood changes, vaginal dryness, and weight gain. Although rare, serious side effects including endometrial cancer, risk of blood clots were also discussed. We strongly believe that the benefits far outweigh the risks. Patient understands these risks and consented to starting treatment.  4.  Patient will undergo CT chest abdomen pelvis and bone scan for staging. 5.  After surgery, adjuvant radiation 6.  Followed by adjuvant antiestrogen therapy with tamoxifen times 10 years  Return to clinic based upon Mammaprint test results

## 2017-05-15 ENCOUNTER — Encounter: Payer: Self-pay | Admitting: General Practice

## 2017-05-15 ENCOUNTER — Encounter: Payer: Self-pay | Admitting: Genetic Counselor

## 2017-05-15 ENCOUNTER — Inpatient Hospital Stay (HOSPITAL_BASED_OUTPATIENT_CLINIC_OR_DEPARTMENT_OTHER): Payer: BLUE CROSS/BLUE SHIELD | Admitting: Genetic Counselor

## 2017-05-15 ENCOUNTER — Encounter: Payer: Self-pay | Admitting: Physical Therapy

## 2017-05-15 DIAGNOSIS — C50611 Malignant neoplasm of axillary tail of right female breast: Secondary | ICD-10-CM | POA: Diagnosis not present

## 2017-05-15 DIAGNOSIS — Z170 Estrogen receptor positive status [ER+]: Secondary | ICD-10-CM | POA: Diagnosis not present

## 2017-05-15 DIAGNOSIS — C50911 Malignant neoplasm of unspecified site of right female breast: Secondary | ICD-10-CM

## 2017-05-15 DIAGNOSIS — Z803 Family history of malignant neoplasm of breast: Secondary | ICD-10-CM | POA: Diagnosis not present

## 2017-05-15 NOTE — Therapy (Signed)
[place], [place], [address] Phone: [phone]   Fax:  [phone]  Physical Therapy Evaluation  Patient Details  Name: Stephanie Holden MRN: [id number] Date of Birth: 04-01-1975 Referring Provider: Dr. [name]   Encounter Date: 05/14/2017  PT End of Session - 05/14/17 1527    Visit Number  1    Number of Visits  1    PT Start Time  1548    PT Stop Time  1632    PT Time Calculation (min)  44 min    Activity Tolerance  Patient tolerated treatment well    Behavior During Therapy  [facility] for tasks assessed/performed       Past Medical History:  Diagnosis Date  . Insulin dependent type 1 diabetes mellitus ([place])   . Insulin pump in place   . Stenosing tenosynovitis of thumb 06/2014   right    Past Surgical History:  Procedure Laterality Date  . CARPAL TUNNEL RELEASE Bilateral 10/28/2013   Procedure: BILATERAL CARPAL TUNNEL RELEASE;  Surgeon: Daryll Brod, MD;  Location: [place];  Service: Orthopedics;  Laterality: Bilateral;  . CESAREAN SECTION  2000; 08/20/2001  . ESSURE TUBAL LIGATION  2005   failed, tubal puncture  . LAPAROSCOPIC TUBAL LIGATION  04/02/2007   removal Essure  . LAPAROSCOPIC UNILATERAL SALPINGECTOMY  2005  . TRIGGER FINGER RELEASE Left 04/12/2014   Procedure: RELEASE A-1 PULLEY LEFT THUMB;  Surgeon: Daryll Brod, MD;  Location: [place];  Service: Orthopedics;  Laterality: Left;  . TRIGGER FINGER RELEASE Right 07/19/2014   Procedure: RELEASE TRIGGER FINGER/A-1 PULLEY RIGHT THUMB;  Surgeon: Daryll Brod, MD;  Location: [place];  Service: Orthopedics;  Laterality: Right;    There were no vitals filed for this visit.   Subjective Assessment - 05/14/17 1508    Subjective  Patient reports she is here at the cancer center to be seen by her medical team for her newly diagnosed right breast cancer.    Patient is accompained by:  Family member     Pertinent History  Patient was diagnosed on 05/08/17 with right invasive ductal carcinoma breast cancer. She was seen in 5/18 and found to have a right axillary mass but did not f/u for further testing. She then had a mammogram in 2/19 and was diagnosed with right breast cancer which has metastasized to han axillary lymph node. She has multiple small masses in the lower outer quadrant of her right breast with the largest mass measuring 1.4 cm. It is ER/PR positive and HER2 negative with a Ki67 of 2%.    Patient Stated Goals  Reduce lymphedema risk and learn post op shoulder ROM HEP    Currently in Pain?  No/denies            LYMPHEDEMA/ONCOLOGY QUESTIONNAIRE - 05/14/17 1526      Type   Cancer Type  Right breast cancer      Lymphedema Assessments   Lymphedema Assessments  Upper extremities      Right Upper Extremity Lymphedema   10 cm Proximal to Olecranon Process  33.9 cm    Olecranon Process  24.4 cm    10 cm Proximal to Ulnar Styloid Process  22 cm    Just Proximal to Ulnar Styloid Process  14.4 cm    Across Hand at [employer]  17.4 cm    At [place] of 2nd Digit  6.2 cm  Left Upper Extremity Lymphedema   10 cm Proximal to Olecranon Process  33.4 cm    Olecranon Process  24.4 cm    10 cm Proximal to Ulnar Styloid Process  21.6 cm    Just Proximal to Ulnar Styloid Process  14.3 cm    Across Hand at [employer]  17.4 cm    At [place] of 2nd Digit  6.1 cm          Objective measurements completed on examination: See above findings.    Patient was instructed today in a home exercise program today for post op shoulder range of motion. These included active assist shoulder flexion in sitting, scapular retraction, wall walking with shoulder abduction, and hands behind head external rotation.  She was encouraged to do these twice a day, holding 3 seconds and repeating 5 times when permitted by her physician.     PT Education - 05/14/17 1526    Education provided  Yes     Education Details  Lymphedema risk reduction and post op shoulder ROM HEP    Person(s) Educated  Patient    Methods  Explanation;Demonstration;Handout    Comprehension  Returned demonstration;Verbalized understanding          PT Long Term Goals - 05/15/17 0804      PT LONG TERM GOAL #1   Title  Patient will demonstrate she has returned to baseline related to shoulder ROM and function.    Time  8    Period  Weeks    Status  Achieved      Breast Clinic Goals - 05/15/17 0803      Patient will be able to verbalize understanding of pertinent lymphedema risk reduction practices relevant to her diagnosis specifically related to skin care.   Time  1    Period  Days    Status  Achieved      Patient will be able to return demonstrate and/or verbalize understanding of the post-op home exercise program related to regaining shoulder range of motion.   Time  1    Period  Days    Status  Achieved      Patient will be able to verbalize understanding of the importance of attending the postoperative After Breast Cancer Class for further lymphedema risk reduction education and therapeutic exercise.   Time  1    Period  Days    Status  Achieved            Plan - 05/15/17 0756    Clinical Impression Statement  Patient was diagnosed on 05/08/17 with right invasive ductal carcinoma breast cancer. She was seen in 5/18 and found to have a right axillary mass but did not f/u for further testing. She then had a mammogram in 2/19 and was diagnosed with right breast cancer which has metastasized to han axillary lymph node. She has multiple small masses in the lower outer quadrant of her right breast with the largest mass measuring 1.4 cm. It is ER/PR positive and HER2 negative with a Ki67 of 2%. Her multidisciplinary medical team met prior to her assessments to determine a recommended treatment plan. She is planning to have genetic testing, staging scans, and mammaprint testing to determine the  need for chemotherapy. If she needs chemo, she will undergo neoadjuvant chemo, a bilateral mastectomy and a targeted axillary lymph node dissection. If she does not need chemo, she will undergo a bilateral mastectomy and a right axillary lymph node dissection. She is considering reconstruction  but is unsure if she wants that. Either way, surgery will be followed by radiation to include the axillary region followed by anti-estrogen therapy. She will benefit from post op PT to reassess and determine PT needs.    History and Personal Factors relevant to plan of care:  Unknown extent of disease until staging scans are complete.    Clinical Presentation  Evolving    Clinical Presentation due to:  Unknown medical plan as she is awaiting results from genetic testing and Mammaprint    Clinical Decision Making  Moderate    Rehab Potential  Excellent    Clinical Impairments Affecting Rehab Potential  None    PT Frequency  -- Eval and f/u visit    PT Treatment/Interventions  ADLs/Self Care Home Management;Therapeutic exercise;Patient/family education    PT Next Visit Plan  Will reassess 3-4 weeks post op to determine needs    PT Home Exercise Plan  Post op shoulder ROM HEP    Consulted and Agree with Plan of Care  Patient;Family member/caregiver    Family Member Consulted  Husband       Patient will benefit from skilled therapeutic intervention in order to improve the following deficits and impairments:  Pain, Decreased scar mobility, Decreased range of motion, Impaired UE functional use, Postural dysfunction, Decreased knowledge of precautions  Visit Diagnosis: Malignant neoplasm of axillary tail of right breast in female, estrogen receptor positive ([place]) - Plan: PT plan of care cert/re-cert  Abnormal posture - Plan: PT plan of care cert/re-cert   Patient will follow up at outpatient cancer rehab 3-4 weeks following surgery.  If the patient requires physical therapy at that time, a specific plan will  be dictated and sent to the referring physician for approval. The patient was educated today on appropriate basic range of motion exercises to begin post operatively and the importance of attending the After Breast Cancer class following surgery.  Patient was educated today on lymphedema risk reduction practices as it pertains to recommendations that will benefit the patient immediately following surgery.  She verbalized good understanding.     Problem List Patient Active Problem List   Diagnosis Date Noted  . Malignant neoplasm of axillary tail of right breast in female, estrogen receptor positive ([place]) 05/09/2017  . Cubital tunnel syndrome on left 10/02/2015  . Trigger finger, left index finger 04/12/2015  . Erroneous encounter - disregard 09/22/2014  . Diabetes type 1, controlled ([place]) 01/12/2013    Annia Friendly, PT 05/15/17 8:08 AM  [place], [place], [address] Phone: [phone]   Fax:  [phone]  Name: Stephanie Holden MRN: [id number] Date of Birth: 01-Feb-1976

## 2017-05-15 NOTE — Progress Notes (Signed)
[place] Note  Spoke at length with Caryl Pina and husband Josph Macho at [facility] yesterday, normalizing feelings, providing emotional support, and introducing [place] team/programming resources.  They report strong faith, good support from their dynamic church community, and deep appreciation for the info/structure/support of [facility] and team. They have two daughters, 10 and 19, and are aware of support resources available for them as well.  Followed up with Caryl Pina briefly after her genetics appt today. Per pt, at this time her biggest sources of stress are completing her scans and discerning about whether to seek breast reconstruction. She and Josph Macho are aware of ongoing chaplain availability, but please also page if needs arise or circumstances change. Thank you.   [place], [place], [facility] Pager [phone] Voicemail [phone]

## 2017-05-15 NOTE — Progress Notes (Signed)
[facility]      Initial Visit   Patient Name: Stephanie Holden Patient DOB: 07/07/1975 Patient Age: 42 y.o. Encounter Date: 05/15/2017  Referring Provider: Nicholas Lose, MD  Primary Care Provider: Haywood Pao, MD  Reason for Visit: Evaluate for hereditary susceptibility to cancer    Assessment and Plan:  . Stephanie Holden's history is not suggestive of a hereditary predisposition to cancer, but given her age at diagnosis (age 60), a genetics evaluation is indicated.   . Testing is recommended to determine whether she has a pathogenic mutation that will impact her screening and risk-reduction for cancer. A negative result will be reassuring.  . Stephanie Holden wished to pursue genetic testing and a blood sample will be sent for analysis of the 23 genes on [facility]'s Breast/GYN panel (ATM, BARD1, BRCA1, BRCA2, BRIP1, CDH1, CHEK2, DICER1, EPCAM, MLH1,  MSH2, MSH6, NBN, NF1, PALB2, PMS2, PTEN, RAD50, RAD51C, RAD51D,SMARCA4, STK11, and TP53).   Marland Kitchen Results should be available in approximately 2-4 weeks, at which point we will contact her and address implications for her as well as address genetic testing for at-risk family members, if needed.     Dr. Lindi Adie was available for questions concerning this case. Total time spent by me in face-to-face counseling was approximately 30 minutes.   _____________________________________________________________________   History of Present Illness: Stephanie Holden, a 42 y.o. female, is being seen at the [facility] due to a personal history of breast cancer. She presents to clinic today to discuss the possibility of a hereditary predisposition to cancer and discuss whether genetic testing is warranted.  Stephanie Holden was recently diagnosed with right breast cancer at the age of 58. She decided to undergo bilateral mastectomies, but this has not yet been scheduled. She is awaiting results of mammaprint. She stated  that results of genetic testing will not alter her surgical plan. The breast tumor was ER positive, PR positive, and HER2 negative.    Malignant neoplasm of axillary tail of right breast in female, estrogen receptor positive ([place])   05/09/2017 Initial Diagnosis    May 2018: Palpable right axillary mass.  The recommended a 6-week follow-up but apparently the mass decreased in size.  Recent increase in the mass was noted, biopsy revealed Grade 1 IDC with DCIS, lymphovascular invasion present, 4 small hypoechoic masses largest 0.9 cm, T1BN1 stage Ib clinical stage       Past Medical History:  Diagnosis Date  . Insulin dependent type 1 diabetes mellitus ([place])   . Insulin pump in place   . Stenosing tenosynovitis of thumb 06/2014   right    Past Surgical History:  Procedure Laterality Date  . CARPAL TUNNEL RELEASE Bilateral 10/28/2013   Procedure: BILATERAL CARPAL TUNNEL RELEASE;  Surgeon: Daryll Brod, MD;  Location: [facility];  Service: Orthopedics;  Laterality: Bilateral;  . CESAREAN SECTION  2000; 08/20/2001  . ESSURE TUBAL LIGATION  2005   failed, tubal puncture  . LAPAROSCOPIC TUBAL LIGATION  04/02/2007   removal Essure  . LAPAROSCOPIC UNILATERAL SALPINGECTOMY  2005  . TRIGGER FINGER RELEASE Left 04/12/2014   Procedure: RELEASE A-1 PULLEY LEFT THUMB;  Surgeon: Daryll Brod, MD;  Location: [facility];  Service: Orthopedics;  Laterality: Left;  . TRIGGER FINGER RELEASE Right 07/19/2014   Procedure: RELEASE TRIGGER FINGER/A-1 PULLEY RIGHT THUMB;  Surgeon: Daryll Brod, MD;  Location: [facility];  Service: Orthopedics;  Laterality:  Right;    Social History   Socioeconomic History  . Marital status: Married    Spouse name: Not on file  . Number of children: Not on file  . Years of education: Not on file  . Highest education level: Not on file  Social Needs  . Financial resource strain: Not on file  . Food insecurity - worry: Not on file  . Food  insecurity - inability: Not on file  . Transportation needs - medical: Not on file  . Transportation needs - non-medical: Not on file  Occupational History  . Not on file  Tobacco Use  . Smoking status: Former Smoker    Last attempt to quit: 03/24/2001    Years since quitting: 16.1  . Smokeless tobacco: Never Used  Substance and Sexual Activity  . Alcohol use: Yes    Alcohol/week: 0.6 oz    Types: 1 Standard drinks or equivalent per week  . Drug use: No  . Sexual activity: Yes    Partners: Male    Birth control/protection: Surgical    Comment: tubal ligation & 1 tube removed  Other Topics Concern  . Not on file  Social History Narrative  . Not on file     Family History:  During the visit, a 4-generation pedigree was obtained. Family tree will be scanned in the Media tab in Epic  Significant diagnoses include the following:  Family History  Problem Relation Age of Onset  . Breast cancer Other 69       paternal grandfather's niece    Additionally, Stephanie Holden has two daughters (ages 55 and 71). She has one brother (age 74) who has a son (age 10). Her mother (age 75) is cancer-free, but had a TAH/BSO around age 63. Her mother had one brother who died at 25, unrealted to cancer. He had no children. Her father (age 32) is cancer-free. He had one sister (age 23).  Stephanie Holden ancestry is Caucasian - NOS. There is no known Jewish ancestry and no consanguinity.  Discussion: We reviewed the characteristics, features and inheritance patterns of hereditary cancer syndromes. We discussed her risk of harboring a mutation in the context of her personal and family history. We discussed the process of genetic testing, insurance coverage and implications of results: positive, negative and variant of unknown significance (VUS).    Stephanie Holden questions were answered to her satisfaction today and she is welcome to call with any additional questions or concerns. Thank you for the referral and  allowing [place] to share in the care of your patient.    Steele Berg, MS, [place] Certified Genetic Counselor phone: [phone] [email or web address]

## 2017-05-16 DIAGNOSIS — H5213 Myopia, bilateral: Secondary | ICD-10-CM | POA: Diagnosis not present

## 2017-05-16 DIAGNOSIS — E113292 Type 2 diabetes mellitus with mild nonproliferative diabetic retinopathy without macular edema, left eye: Secondary | ICD-10-CM | POA: Diagnosis not present

## 2017-05-20 ENCOUNTER — Ambulatory Visit (HOSPITAL_COMMUNITY): Payer: BLUE CROSS/BLUE SHIELD

## 2017-05-20 ENCOUNTER — Telehealth: Payer: Self-pay | Admitting: *Deleted

## 2017-05-20 NOTE — Telephone Encounter (Signed)
  Oncology Nurse Navigator Documentation  Navigator Location: [facility] (05/20/17 1400)   )Navigator Encounter Type: Telephone;[facility] Follow-up (05/20/17 1400) Telephone: Outgoing Call;Clinic/[facility] Follow-up (05/20/17 1400)                                                  Time Spent with Patient: 15 (05/20/17 1400)

## 2017-05-21 ENCOUNTER — Ambulatory Visit (HOSPITAL_COMMUNITY)
Admission: RE | Admit: 2017-05-21 | Discharge: 2017-05-21 | Disposition: A | Discharge status: Home / Self Care | Payer: BLUE CROSS/BLUE SHIELD | Source: Ambulatory Visit | Attending: Hematology and Oncology | Admitting: Hematology and Oncology

## 2017-05-21 ENCOUNTER — Ambulatory Visit (HOSPITAL_COMMUNITY): Payer: BLUE CROSS/BLUE SHIELD

## 2017-05-21 ENCOUNTER — Other Ambulatory Visit: Payer: Self-pay | Admitting: Hematology and Oncology

## 2017-05-21 ENCOUNTER — Telehealth: Payer: Self-pay | Admitting: Hematology and Oncology

## 2017-05-21 DIAGNOSIS — C50411 Malignant neoplasm of upper-outer quadrant of right female breast: Secondary | ICD-10-CM | POA: Diagnosis not present

## 2017-05-21 DIAGNOSIS — C50611 Malignant neoplasm of axillary tail of right female breast: Secondary | ICD-10-CM | POA: Insufficient documentation

## 2017-05-21 DIAGNOSIS — Z170 Estrogen receptor positive status [ER+]: Secondary | ICD-10-CM | POA: Diagnosis not present

## 2017-05-21 DIAGNOSIS — R9389 Abnormal findings on diagnostic imaging of other specified body structures: Secondary | ICD-10-CM | POA: Insufficient documentation

## 2017-05-21 DIAGNOSIS — C50911 Malignant neoplasm of unspecified site of right female breast: Secondary | ICD-10-CM | POA: Diagnosis not present

## 2017-05-21 DIAGNOSIS — C50919 Malignant neoplasm of unspecified site of unspecified female breast: Secondary | ICD-10-CM | POA: Diagnosis not present

## 2017-05-21 MED ORDER — VENLAFAXINE HCL ER 37.5 MG PO CP24: 37.5000 mg | ORAL_CAPSULE | Freq: Every day | ORAL | 3 refills | Status: DC

## 2017-05-21 MED ORDER — TECHNETIUM TC 99M MEDRONATE IV KIT
22.0000 | PACK | Freq: Once | INTRAVENOUS | Status: AC | PRN
  Administered 2017-05-21: 22 via INTRAVENOUS

## 2017-05-21 MED ORDER — IOPAMIDOL (ISOVUE-300) INJECTION 61%
INTRAVENOUS | Status: AC
  Administered 2017-05-21: 100 mL
  Filled 2017-05-21: qty 100

## 2017-05-21 MED ORDER — TECHNETIUM TC 99M MEDRONATE IV KIT
25.0000 | PACK | Freq: Once | INTRAVENOUS | Status: DC | PRN
Start: 2017-05-21 — End: 2017-05-21

## 2017-05-21 NOTE — Telephone Encounter (Signed)
I informed the patient the results of CT scans and bone scans which did not show any evidence of metastatic disease. The patient had a severe episode of depression and tearfulness and crying and she has felt fairly poorly from that. I started her on Effexor and sent a prescription.

## 2017-05-23 ENCOUNTER — Telehealth: Payer: Self-pay | Admitting: Certified Nurse Midwife

## 2017-05-23 NOTE — Telephone Encounter (Signed)
Left Message to call if has questions or concerns and thoughts with her in this time of evaluation and decisions.

## 2017-05-27 ENCOUNTER — Ambulatory Visit: Payer: Self-pay | Admitting: Genetic Counselor

## 2017-05-27 ENCOUNTER — Encounter: Payer: Self-pay | Admitting: Genetic Counselor

## 2017-05-27 ENCOUNTER — Telehealth: Payer: Self-pay | Admitting: *Deleted

## 2017-05-27 DIAGNOSIS — Z1379 Encounter for other screening for genetic and chromosomal anomalies: Secondary | ICD-10-CM

## 2017-05-27 HISTORY — DX: Encounter for other screening for genetic and chromosomal anomalies: Z13.79

## 2017-05-27 NOTE — Progress Notes (Signed)
Cancer Genetics Clinic       Genetic Test Results    Patient Name: Stephanie Holden Patient DOB: [birth year] Patient Age: 42 y.o. Encounter Date: 05/27/2017  Referring Provider: Nicholas Lose, MD  Primary Care Provider: Haywood Pao, MD   Ms. Vankirk was called today to discuss genetic test results. Please see the Genetics note from her visit on 05/15/2017 for a detailed discussion of her personal and family history.  Genetic Testing: At the time of Ms. Lucado's visit, she decided to pursue genetic testing of multiple genes associated with hereditary susceptibility to breast and gynecologic cancers. Testing included sequencing and deletion/duplication analysis. Testing did not reveal a pathogenic mutation in any of the genes analyzed.  A copy of the genetic test report will be scanned into Epic under the Media tab.  The genes analyzed were the 23 genes on [facility]'s Breast/GYN panel (ATM, BARD1, BRCA1, BRCA2, BRIP1, CDH1, CHEK2, DICER1, EPCAM, MLH1,  MSH2, MSH6, NBN, NF1, PALB2, PMS2, PTEN, RAD50, RAD51C, RAD51D,SMARCA4, STK11, and TP53).  Since the current test is not perfect, it is possible that there may be a gene mutation that current testing cannot detect, but that chance is small. It is possible that a different genetic factor, which has not yet been discovered or is not on this panel, is responsible for the cancer diagnoses in the family. Again, the likelihood of this is low. No additional testing is recommended at this time for Ms. Rozzell.  A Variant of Uncertain Significance was detected: STK11 c.374+[id number] ([employer]). This is still considered a normal result. While at this time, it is unknown if this finding is associated with increased cancer risk, the majority of these variants get reclassified to be inconsequential. Medical management should not be based on this finding. With time, we suspect the lab will determine the significance, if any. If we do learn more about  it, we will try to contact Ms. Janus to discuss it further. It is important to stay in touch with [place] periodically and keep the address and phone number up to date.  Cancer Screening: These results suggest that Ms. Deen's cancer was most likely not due to an inherited predisposition. Most cancers happen by chance and this test, along with details of her family history, suggests that her cancer falls into this category. She is recommended to follow the cancer screening guidelines provided by her physician.   Family Members: Given the young age of breast cancer in the family, women are recommended to speak with their own providers about having a yearly mammogram beginning at age 75, which is 34 years earlier than the youngest breast cancer in the family and discuss with their own provider whether there is a benefit to adding tomosynthesis to the mammogram. Women are recommended to also have a yearly clinical breast exam, a yearly gynecologic exam and perform monthly breast self-exams. Colon cancer screening is recommended to begin by age 41 in both men and women, unless there is a family history of colon cancer or colon polyps or an individual has a personal history to warrant initiating screening at a younger age.  Any relative who had cancer at a young age or had a particularly rare cancer may also wish to pursue genetic testing. Genetic counselors can be located in other cities, by visiting the website of the [employer] (ArtistMovie.se) and Field seismologist for a Dietitian by zip  code.   Family members are not recommended to get tested for the above VUS outside of a research protocol as this finding has no implications for their medical management.  Lastly, cancer genetics is a rapidly advancing field and it is possible that new genetic tests will be appropriate for Ms. Manley in the future. We encourage her to remain in contact with [place] on an annual basis so we can update her  personal and family histories, and let her know of advances in cancer genetics that may benefit the family. Our contact number was provided. Ms. Forlenza is welcome to call anytime with additional questions.     Steele Berg, MS, [place] Certified Genetic Counselor phone: [phone]

## 2017-05-27 NOTE — Telephone Encounter (Signed)
Received mammaprint results of low risk.. Patient is aware and ready to proceed with surgery with no reconstruction.

## 2017-05-28 ENCOUNTER — Encounter (HOSPITAL_COMMUNITY): Payer: Self-pay

## 2017-05-29 ENCOUNTER — Other Ambulatory Visit: Payer: Self-pay | Admitting: General Surgery

## 2017-05-29 DIAGNOSIS — C50611 Malignant neoplasm of axillary tail of right female breast: Secondary | ICD-10-CM

## 2017-06-03 ENCOUNTER — Telehealth: Payer: Self-pay | Admitting: Hematology and Oncology

## 2017-06-03 NOTE — Telephone Encounter (Signed)
Spoke to patient regarding upcoming march appointments per 3/21 sch message.

## 2017-06-04 ENCOUNTER — Encounter (HOSPITAL_COMMUNITY): Payer: Self-pay | Admitting: *Deleted

## 2017-06-04 ENCOUNTER — Other Ambulatory Visit: Payer: Self-pay

## 2017-06-04 ENCOUNTER — Other Ambulatory Visit: Payer: Self-pay | Admitting: *Deleted

## 2017-06-04 ENCOUNTER — Telehealth: Payer: Self-pay | Admitting: Certified Nurse Midwife

## 2017-06-04 DIAGNOSIS — C773 Secondary and unspecified malignant neoplasm of axilla and upper limb lymph nodes: Secondary | ICD-10-CM | POA: Diagnosis not present

## 2017-06-04 DIAGNOSIS — C50411 Malignant neoplasm of upper-outer quadrant of right female breast: Secondary | ICD-10-CM | POA: Diagnosis not present

## 2017-06-04 DIAGNOSIS — C50919 Malignant neoplasm of unspecified site of unspecified female breast: Secondary | ICD-10-CM | POA: Diagnosis not present

## 2017-06-04 DIAGNOSIS — C50611 Malignant neoplasm of axillary tail of right female breast: Secondary | ICD-10-CM

## 2017-06-04 DIAGNOSIS — Z170 Estrogen receptor positive status [ER+]: Secondary | ICD-10-CM

## 2017-06-04 NOTE — H&P (View-Only) (Signed)
Stephanie Holden [name] Documented: 06/04/2017 4:16 PM Location: [facility] Patient #: [id number] DOB: [birth year] Married / Language: [name] / Race: White Female   History of Present Illness [name] MD; 06/04/2017 5:56 PM) The patient is a 42 year old female who presents for a follow-up for Breast cancer. Pt is a 42 yo F diagnosed with right breast cancer 04/2017. She had a painful mass in her right axilla last may and got referred for an ultrasound. However, by the time she went for imaging, it had resolved. She states that it was associated with her pre menstrual symptoms which include very painful breasts bilaterally and significant cramps. she felt like that was the most painful period she ever had. She was recommended to get short interval follow up in 6 weeks, but put it off because the node did not seem to come back. She noted it again just after christmas, but waited a full menstrual cycle before bringing it up to her gyn. She then underwent repeat dx mammogram/us. The lymph node was biopsied and was positive for cancer, however, nothing significant could be found in her right breast. Because of this, she had a breast MRI. Several small masses were seen wtih the largest being 8 mm, but the total together spanning 2.1 cm. However, significant non-mass enhancement was seen as well throughout a good portion of the right breast. Second look ultrasound found two areas to biopsy and these were both were grade 1 invasive ductal carcinoma, grade 1, +/+/-, Ki67 10%.    The patient discussed with her husband and research pros and cons of immediate reconstruction. She decided that since reconstruction was additional surgery, and she is a diabetic, that she would not want to proceed with that. An appointment was made with plastic surgery, but she decided to cancel it and proceed without recon. She also requires radiation given her presentation with a positive lymph node. She just wants  to get her cancer treatment taking care of and move forward. She desires bilateral mastectomies because her cancer was occult and not seen on mammogram or ultrasound. She presented with lymph node positive disease with nothing on her imaging. This is very reasonable. She denies any current breast pain. She is eager to proceed with surgery.   Medication History [name], CMA; 06/04/2017 4:18 PM) Medications Reconciled    Review of Systems [name] MD; 06/04/2017 5:56 PM) All other systems negative  Vitals ([name] CMA; 06/04/2017 4:17 PM) 06/04/2017 4:17 PM Weight: 158 lb Height: 62in Body Surface Area: 1.73 m Body Mass Index: 28.9 kg/m  Pulse: 76 (Regular)  BP: 126/78 (Sitting, Left Arm, Standard)       Physical Exam [name] MD; 06/04/2017 5:57 PM) The physical exam findings are as follows: Note:entire visit spent in counseling.    Assessment & Plan [name] MD; 06/04/2017 5:59 PM) BREAST CANCER METASTASIZED TO AXILLARY LYMPH NODE (C50.919) Impression: We will remove the palpable lymph node. We will plan a right mastectomy with sentinel node biopsy in addition to removal of that node. We will do a left simple mastectomy. The risks of surgery were discussed with the patient including bleeding, infection, pain, numbness, blood clot, possible need for additional surgeries or procedures, possible dissatisfaction with scars, possible recurrent cancer, possible decreased range of motion, and others. We discussed postoperative restrictions. I reviewed the surgery incision type as well as timing. We are scheduled for this for tomorrow. Her and her husband's questions were answered. I reviewed drain placement  and timing of drain removal. I discussed things to be on the look out for once she is home. Patient understands and wishes to proceed.  35 min spent in evaluation, examination, counseling, and coordination of care. >50% spent in  counseling. PRIMARY CANCER OF UPPER OUTER QUADRANT OF RIGHT FEMALE BREAST (C50.411) Impression: see above.    Signed by [name], MD (06/04/2017 6:00 PM)

## 2017-06-04 NOTE — Progress Notes (Signed)
Inpatient Diabetes Program Recommendations  AACE/ADA: New Consensus Statement on Inpatient Glycemic Control (2015)  Target Ranges:  Prepandial:   less than 140 mg/dL      Peak postprandial:   less than 180 mg/dL (1-2 hours)      Critically ill patients:  140 - 180 mg/dL    Diabetes history: Type 1 DM since age 42  Outpatient Diabetes medications:  Insulin pump-670 G- She is not wearing sensor Total basal:  22.1 units/day 12MN-5a-0.8 units/hr 5a-1p- 0.975 units/hr 1p-5p-1 unit/hr 5p-12a-0.9 units/hr  I:C ratio-1 unit/12 g CHO Correction factor- 1 unit drops blood sugars approximately 55 mg/dL Inpatient Diabetes Program Recommendations:    Patient for surgery tomorrow.  Advised to reduce basal rates by 20 % at midnight since NPO.  Based on length of surgery, recommend patient to remove insulin pump prior to surgery and start IV insulin/Glucostabilizer.  Once out of surgery and alert and oriented, patient may restart insulin pump per previous home settings.   Thanks  Adah Perl, RN, BC-ADM Inpatient Diabetes Coordinator Pager [phone] (8a-5p)

## 2017-06-04 NOTE — Telephone Encounter (Signed)
Patient called requesting a call back from Melvia Heaps, CNM to follow up with her. Patient aware a nurse may call her first to get more information.

## 2017-06-04 NOTE — H&P (Signed)
Stephanie Holden Stephanie Holden: 06/04/2017 4:16 PM Location: [facility] Patient #: [id number] DOB: [birth year] Married / Language: Stephanie Holden / Race: White Female   History of Present Illness Stephanie Klein MD; 06/04/2017 5:56 PM) The patient is a 42 year old female who presents for a follow-up for Breast cancer. Pt is a 42 yo F diagnosed with right breast cancer 04/2017. She had a painful mass in her right axilla last may and got referred for an ultrasound. However, by the time she went for imaging, it had resolved. She states that it was associated with her pre menstrual symptoms which include very painful breasts bilaterally and significant cramps. she felt like that was the most painful period she ever had. She was recommended to get short interval follow up in 6 weeks, but put it off because the node did not seem to come back. She noted it again just after christmas, but waited a full menstrual cycle before bringing it up to her gyn. She then underwent repeat dx mammogram/us. The lymph node was biopsied and was positive for cancer, however, nothing significant could be found in her right breast. Because of this, she had a breast MRI. Several small masses were seen wtih the largest being 8 mm, but the total together spanning 2.1 cm. However, significant non-mass enhancement was seen as well throughout a good portion of the right breast. Second look ultrasound found two areas to biopsy and these were both were grade 1 invasive ductal carcinoma, grade 1, +/+/-, Ki67 10%.    The patient discussed with her husband and research pros and cons of immediate reconstruction. She decided that since reconstruction was additional surgery, and she is a diabetic, that she would not want to proceed with that. An appointment was made with plastic surgery, but she decided to cancel it and proceed without recon. She also requires radiation given her presentation with a positive lymph node. She just wants  to get her cancer treatment taking care of and move forward. She desires bilateral mastectomies because her cancer was occult and not seen on mammogram or ultrasound. She presented with lymph node positive disease with nothing on her imaging. This is very reasonable. She denies any current breast pain. She is eager to proceed with surgery.   Medication History Stephanie Holden, CMA; 06/04/2017 4:18 PM) Medications Reconciled    Review of Systems Stephanie Klein MD; 06/04/2017 5:56 PM) All other systems negative  Vitals (Stephanie Holden CMA; 06/04/2017 4:17 PM) 06/04/2017 4:17 PM Weight: 158 lb Height: 62in Body Surface Area: 1.73 m Body Mass Index: 28.9 kg/m  Pulse: 76 (Regular)  BP: 126/78 (Sitting, Left Arm, Standard)       Physical Exam Stephanie Klein MD; 06/04/2017 5:57 PM) The physical exam findings are as follows: Note:entire visit spent in counseling.    Assessment & Plan Stephanie Klein MD; 06/04/2017 5:59 PM) BREAST CANCER METASTASIZED TO AXILLARY LYMPH NODE (C50.919) Impression: We will remove the palpable lymph node. We will plan a right mastectomy with sentinel node biopsy in addition to removal of that node. We will do a left simple mastectomy. The risks of surgery were discussed with the patient including bleeding, infection, pain, numbness, blood clot, possible need for additional surgeries or procedures, possible dissatisfaction with scars, possible recurrent cancer, possible decreased range of motion, and others. We discussed postoperative restrictions. I reviewed the surgery incision type as well as timing. We are scheduled for this for tomorrow. Her and her husband's questions were answered. I reviewed drain placement  and timing of drain removal. I discussed things to be on the look out for once she is home. Patient understands and wishes to proceed.  35 min spent in evaluation, examination, counseling, and coordination of care. >50% spent in  counseling. PRIMARY CANCER OF UPPER OUTER QUADRANT OF RIGHT FEMALE BREAST (C50.411) Impression: see above.    Signed by Stephanie Klein, MD (06/04/2017 6:00 PM)

## 2017-06-04 NOTE — Telephone Encounter (Signed)
Patient is scheduled to have bilateral mastectomies with right sentinel lymph node biopsy tomorrow with Dr.Byerly. Wants to speak with Melvia Heaps CNM.

## 2017-06-04 NOTE — Telephone Encounter (Signed)
Call to patient regarding status with breast cancer. She is having her bilateral mastectomies on 06/05/17. At 11:30 at [facility]. No reconstruction planned. Prayers are asked for tomorrow. No chemo will be needed! Only radiation.

## 2017-06-04 NOTE — Progress Notes (Signed)
Spoke with pt for pre-op call. Pt denies cardiac history, chest pain or sob. Pt is a Type 1 Diabetic. She states her last A1C was 7.4 in January, 2019. Pt states her fasting blood sugar is usually between 100-130. She states she has been unable to wear her continuous CBG monitor due to all the testing she's had to do recently. She states her blood sugar in the afternoon and evenings have been higher than normal because of not wearing it. Pt has an insulin pump. I instructed her to reduce the Basal rate at midnight by 20%. Pt voiced understanding. Pt instructed to check her blood sugar in the AM when she gets up and every 2 hours until she leaves for the hospital. If blood sugar is 70 or below, treat with 1/2 cup of clear juice (apple or cranberry) and recheck blood sugar 15 minutes after drinking juice. If blood sugar continues to be 70 or below, call the Short Stay department and ask to speak to a nurse. Pt voiced understanding. I have notified Ambrose Mantle, RN Diabetes Coordinator. She states pt will need to be transitioned to IV insulin due to surgery being 4 hours in length.

## 2017-06-05 ENCOUNTER — Encounter (HOSPITAL_COMMUNITY): Payer: Self-pay | Admitting: Certified Registered Nurse Anesthetist

## 2017-06-05 ENCOUNTER — Ambulatory Visit (HOSPITAL_COMMUNITY): Payer: BLUE CROSS/BLUE SHIELD | Admitting: Certified Registered Nurse Anesthetist

## 2017-06-05 ENCOUNTER — Encounter (HOSPITAL_COMMUNITY)
Admission: RE | Admit: 2017-06-05 | Discharge: 2017-06-05 | Disposition: A | Discharge status: Home / Self Care | Payer: BLUE CROSS/BLUE SHIELD | Source: Ambulatory Visit | Attending: General Surgery | Admitting: General Surgery

## 2017-06-05 ENCOUNTER — Ambulatory Visit (HOSPITAL_COMMUNITY)
Admission: RE | Admit: 2017-06-05 | Discharge: 2017-06-06 | Disposition: A | Discharge status: Home / Self Care | Payer: BLUE CROSS/BLUE SHIELD | Source: Ambulatory Visit | Attending: General Surgery | Admitting: General Surgery

## 2017-06-05 ENCOUNTER — Encounter (HOSPITAL_COMMUNITY)
Admission: RE | Disposition: A | Discharge status: Home / Self Care | Payer: Self-pay | Source: Ambulatory Visit | Attending: General Surgery

## 2017-06-05 DIAGNOSIS — C50811 Malignant neoplasm of overlapping sites of right female breast: Secondary | ICD-10-CM | POA: Diagnosis not present

## 2017-06-05 DIAGNOSIS — Z794 Long term (current) use of insulin: Secondary | ICD-10-CM | POA: Insufficient documentation

## 2017-06-05 DIAGNOSIS — C50611 Malignant neoplasm of axillary tail of right female breast: Secondary | ICD-10-CM | POA: Diagnosis not present

## 2017-06-05 DIAGNOSIS — C773 Secondary and unspecified malignant neoplasm of axilla and upper limb lymph nodes: Secondary | ICD-10-CM | POA: Diagnosis not present

## 2017-06-05 DIAGNOSIS — C50911 Malignant neoplasm of unspecified site of right female breast: Secondary | ICD-10-CM | POA: Diagnosis not present

## 2017-06-05 DIAGNOSIS — N6012 Diffuse cystic mastopathy of left breast: Secondary | ICD-10-CM | POA: Diagnosis not present

## 2017-06-05 DIAGNOSIS — Z87891 Personal history of nicotine dependence: Secondary | ICD-10-CM | POA: Insufficient documentation

## 2017-06-05 DIAGNOSIS — E109 Type 1 diabetes mellitus without complications: Secondary | ICD-10-CM | POA: Diagnosis not present

## 2017-06-05 DIAGNOSIS — Z79810 Long term (current) use of selective estrogen receptor modulators (SERMs): Secondary | ICD-10-CM | POA: Insufficient documentation

## 2017-06-05 DIAGNOSIS — C50411 Malignant neoplasm of upper-outer quadrant of right female breast: Secondary | ICD-10-CM | POA: Insufficient documentation

## 2017-06-05 DIAGNOSIS — Z170 Estrogen receptor positive status [ER+]: Secondary | ICD-10-CM | POA: Diagnosis not present

## 2017-06-05 DIAGNOSIS — M65322 Trigger finger, left index finger: Secondary | ICD-10-CM | POA: Diagnosis not present

## 2017-06-05 DIAGNOSIS — G8918 Other acute postprocedural pain: Secondary | ICD-10-CM | POA: Diagnosis not present

## 2017-06-05 HISTORY — PX: MASTECTOMY W/ SENTINEL NODE BIOPSY: SHX2001

## 2017-06-05 HISTORY — DX: Depression, unspecified: F32.A

## 2017-06-05 HISTORY — DX: Malignant (primary) neoplasm, unspecified: C80.1

## 2017-06-05 HISTORY — DX: Major depressive disorder, single episode, unspecified: F32.9

## 2017-06-05 HISTORY — DX: Constipation, unspecified: K59.00

## 2017-06-05 HISTORY — DX: Gastro-esophageal reflux disease without esophagitis: K21.9

## 2017-06-05 HISTORY — DX: Nontoxic goiter, unspecified: E04.9

## 2017-06-05 LAB — GLUCOSE, CAPILLARY
GLUCOSE-CAPILLARY: 222 mg/dL — AB (ref 65–99)
GLUCOSE-CAPILLARY: 252 mg/dL — AB (ref 65–99)
Glucose-Capillary: 162 mg/dL — ABNORMAL HIGH (ref 65–99)
Glucose-Capillary: 186 mg/dL — ABNORMAL HIGH (ref 65–99)
Glucose-Capillary: 120 mg/dL — ABNORMAL HIGH (ref 65–99)
Glucose-Capillary: 109 mg/dL — ABNORMAL HIGH (ref 65–99)
Glucose-Capillary: 150 mg/dL — ABNORMAL HIGH (ref 65–99)

## 2017-06-05 LAB — CBC
HCT: 44.4 % (ref 36.0–46.0)
Hemoglobin: 15.1 g/dL — ABNORMAL HIGH (ref 12.0–15.0)
MCH: 29.6 pg (ref 26.0–34.0)
MCHC: 34 g/dL (ref 30.0–36.0)
MCV: 87.1 fL (ref 78.0–100.0)
PLATELETS: 237 10*3/uL (ref 150–400)
RBC: 5.1 MIL/uL (ref 3.87–5.11)
RDW: 13.4 % (ref 11.5–15.5)
WBC: 9.4 10*3/uL (ref 4.0–10.5)

## 2017-06-05 LAB — BASIC METABOLIC PANEL
ANION GAP: 12 (ref 5–15)
BUN: 15 mg/dL (ref 6–20)
CHLORIDE: 104 mmol/L (ref 101–111)
CO2: 20 mmol/L — AB (ref 22–32)
CREATININE: 0.91 mg/dL (ref 0.44–1.00)
Calcium: 8.9 mg/dL (ref 8.9–10.3)
GFR calc Af Amer: >60 mL/min (ref >60)
GFR calc non Af Amer: >60 mL/min (ref >60)
Glucose, Bld: 233 mg/dL — ABNORMAL HIGH (ref 65–99)
Potassium: 4.5 mmol/L (ref 3.5–5.1)
SODIUM: 136 mmol/L (ref 135–145)

## 2017-06-05 LAB — POCT PREGNANCY, URINE: Preg Test, Ur: NEGATIVE

## 2017-06-05 SURGERY — MASTECTOMY WITH SENTINEL LYMPH NODE BIOPSY
Anesthesia: Regional | Site: Breast | Laterality: Bilateral

## 2017-06-05 MED ORDER — OXYCODONE HCL 5 MG/5ML PO SOLN: 5.0000 mg | Freq: Once | ORAL | Status: DC | PRN

## 2017-06-05 MED ORDER — LIDOCAINE HCL 1 % IJ SOLN
INTRAMUSCULAR | Status: AC
  Filled 2017-06-05: qty 20

## 2017-06-05 MED ORDER — METHYLENE BLUE 0.5 % INJ SOLN
INTRAVENOUS | Status: AC
  Filled 2017-06-05: qty 10

## 2017-06-05 MED ORDER — KCL IN DEXTROSE-NACL 20-5-0.45 MEQ/L-%-% IV SOLN
INTRAVENOUS | Status: AC
  Administered 2017-06-05: 18:00:00 via INTRAVENOUS
  Filled 2017-06-05 (×2): qty 1000

## 2017-06-05 MED ORDER — DIPHENHYDRAMINE HCL 50 MG/ML IJ SOLN: 12.5000 mg | Freq: Four times a day (QID) | INTRAMUSCULAR | Status: DC | PRN

## 2017-06-05 MED ORDER — ACETAMINOPHEN 500 MG PO TABS
1000.0000 mg | ORAL_TABLET | ORAL | Status: AC
  Administered 2017-06-05: 1000 mg via ORAL
  Filled 2017-06-05: qty 2

## 2017-06-05 MED ORDER — KETOROLAC TROMETHAMINE 30 MG/ML IJ SOLN
INTRAMUSCULAR | Status: AC
  Filled 2017-06-05: qty 1

## 2017-06-05 MED ORDER — CEFAZOLIN SODIUM-DEXTROSE 2-4 GM/100ML-% IV SOLN
INTRAVENOUS | Status: AC
  Filled 2017-06-05: qty 100

## 2017-06-05 MED ORDER — INSULIN ASPART 100 UNIT/ML ~~LOC~~ SOLN: 40.0000 [IU] | SUBCUTANEOUS | Status: DC

## 2017-06-05 MED ORDER — INSULIN PUMP
Freq: Three times a day (TID) | SUBCUTANEOUS | Status: DC
  Administered 2017-06-05: 21:00:00 via SUBCUTANEOUS
  Administered 2017-06-06: 1.6 via SUBCUTANEOUS
  Administered 2017-06-06: 0.4 via SUBCUTANEOUS
  Filled 2017-06-05: qty 1

## 2017-06-05 MED ORDER — ONDANSETRON HCL 4 MG/2ML IJ SOLN
INTRAMUSCULAR | Status: AC
  Filled 2017-06-05: qty 4

## 2017-06-05 MED ORDER — CELECOXIB 200 MG PO CAPS
200.0000 mg | ORAL_CAPSULE | Freq: Two times a day (BID) | ORAL | Status: DC
  Administered 2017-06-05 – 2017-06-06 (×2): 200 mg via ORAL
  Filled 2017-06-05 (×2): qty 1

## 2017-06-05 MED ORDER — CEFAZOLIN SODIUM-DEXTROSE 2-4 GM/100ML-% IV SOLN
2.0000 g | INTRAVENOUS | Status: AC
  Administered 2017-06-05: 2 g via INTRAVENOUS

## 2017-06-05 MED ORDER — GABAPENTIN 300 MG PO CAPS
300.0000 mg | ORAL_CAPSULE | Freq: Two times a day (BID) | ORAL | Status: DC
  Administered 2017-06-05 – 2017-06-06 (×2): 300 mg via ORAL
  Filled 2017-06-05 (×2): qty 1

## 2017-06-05 MED ORDER — KETOROLAC TROMETHAMINE 30 MG/ML IJ SOLN
INTRAMUSCULAR | Status: DC | PRN
  Administered 2017-06-05: 30 mg via INTRAVENOUS

## 2017-06-05 MED ORDER — SODIUM CHLORIDE 0.9 % IV SOLN
1.0000 [IU]/h | INTRAVENOUS | Status: DC
  Administered 2017-06-05: 2.5 [IU]/h via INTRAVENOUS
  Filled 2017-06-05: qty 1

## 2017-06-05 MED ORDER — FENTANYL CITRATE (PF) 250 MCG/5ML IJ SOLN
INTRAMUSCULAR | Status: AC
  Filled 2017-06-05: qty 5

## 2017-06-05 MED ORDER — GLYCOPYRROLATE 0.2 MG/ML IJ SOLN
INTRAMUSCULAR | Status: DC | PRN
  Administered 2017-06-05: 0.4 mg via INTRAVENOUS

## 2017-06-05 MED ORDER — TRAMADOL HCL 50 MG PO TABS: 50.0000 mg | ORAL_TABLET | Freq: Four times a day (QID) | ORAL | Status: DC | PRN

## 2017-06-05 MED ORDER — LACTATED RINGERS IV SOLN
INTRAVENOUS | Status: DC
  Administered 2017-06-05 – 2017-06-06 (×3): via INTRAVENOUS

## 2017-06-05 MED ORDER — BUPIVACAINE-EPINEPHRINE (PF) 0.5% -1:200000 IJ SOLN
INTRAMUSCULAR | Status: AC
  Filled 2017-06-05: qty 30

## 2017-06-05 MED ORDER — CHLORHEXIDINE GLUCONATE CLOTH 2 % EX PADS: 6.0000 | MEDICATED_PAD | Freq: Once | CUTANEOUS | Status: DC

## 2017-06-05 MED ORDER — GABAPENTIN 300 MG PO CAPS
300.0000 mg | ORAL_CAPSULE | ORAL | Status: AC
  Administered 2017-06-05: 300 mg via ORAL
  Filled 2017-06-05: qty 1

## 2017-06-05 MED ORDER — 0.9 % SODIUM CHLORIDE (POUR BTL) OPTIME
TOPICAL | Status: DC | PRN
  Administered 2017-06-05 (×2): 1000 mL

## 2017-06-05 MED ORDER — NEOSTIGMINE METHYLSULFATE 5 MG/5ML IV SOSY
PREFILLED_SYRINGE | INTRAVENOUS | Status: AC
  Filled 2017-06-05: qty 5

## 2017-06-05 MED ORDER — OXYCODONE HCL 5 MG PO TABS: 5.0000 mg | ORAL_TABLET | Freq: Once | ORAL | Status: DC | PRN

## 2017-06-05 MED ORDER — DIPHENHYDRAMINE HCL 12.5 MG/5ML PO ELIX: 12.5000 mg | ORAL_SOLUTION | Freq: Four times a day (QID) | ORAL | Status: DC | PRN

## 2017-06-05 MED ORDER — MEPERIDINE HCL 50 MG/ML IJ SOLN: 6.2500 mg | INTRAMUSCULAR | Status: DC | PRN

## 2017-06-05 MED ORDER — SODIUM CHLORIDE 0.9 % IJ SOLN
INTRAVENOUS | Status: DC | PRN
  Administered 2017-06-05: 3 mL

## 2017-06-05 MED ORDER — MIDAZOLAM HCL 5 MG/5ML IJ SOLN
INTRAMUSCULAR | Status: DC | PRN
  Administered 2017-06-05: 2 mg via INTRAVENOUS

## 2017-06-05 MED ORDER — BUPIVACAINE-EPINEPHRINE (PF) 0.25% -1:200000 IJ SOLN
INTRAMUSCULAR | Status: AC
  Filled 2017-06-05: qty 30

## 2017-06-05 MED ORDER — LIDOCAINE HCL (CARDIAC) 20 MG/ML IV SOLN
INTRAVENOUS | Status: DC | PRN
  Administered 2017-06-05: 60 mg via INTRAVENOUS

## 2017-06-05 MED ORDER — SUGAMMADEX SODIUM 200 MG/2ML IV SOLN
INTRAVENOUS | Status: AC
  Filled 2017-06-05: qty 2

## 2017-06-05 MED ORDER — FENTANYL CITRATE (PF) 100 MCG/2ML IJ SOLN
INTRAMUSCULAR | Status: DC | PRN
  Administered 2017-06-05: 100 ug via INTRAVENOUS
  Administered 2017-06-05 (×3): 50 ug via INTRAVENOUS

## 2017-06-05 MED ORDER — PROPOFOL 10 MG/ML IV BOLUS
INTRAVENOUS | Status: DC | PRN
  Administered 2017-06-05: 150 mg via INTRAVENOUS

## 2017-06-05 MED ORDER — ONDANSETRON HCL 4 MG/2ML IJ SOLN
INTRAMUSCULAR | Status: DC | PRN
Start: 2017-06-05 — End: 2017-06-05
  Administered 2017-06-05: 4 mg via INTRAVENOUS

## 2017-06-05 MED ORDER — PROPOFOL 10 MG/ML IV BOLUS
INTRAVENOUS | Status: AC
  Filled 2017-06-05: qty 20

## 2017-06-05 MED ORDER — MIDAZOLAM HCL 2 MG/2ML IJ SOLN
2.0000 mg | Freq: Once | INTRAMUSCULAR | Status: AC
  Administered 2017-06-05: 2 mg via INTRAVENOUS

## 2017-06-05 MED ORDER — ONDANSETRON 4 MG PO TBDP: 4.0000 mg | ORAL_TABLET | Freq: Four times a day (QID) | ORAL | Status: DC | PRN

## 2017-06-05 MED ORDER — FENTANYL CITRATE (PF) 100 MCG/2ML IJ SOLN
INTRAMUSCULAR | Status: AC
  Administered 2017-06-05: 50 ug via INTRAVENOUS
  Filled 2017-06-05: qty 2

## 2017-06-05 MED ORDER — OXYCODONE HCL 5 MG PO TABS
ORAL_TABLET | ORAL | Status: AC
  Administered 2017-06-05: 10 mg via ORAL
  Filled 2017-06-05: qty 2

## 2017-06-05 MED ORDER — NEOSTIGMINE METHYLSULFATE 10 MG/10ML IV SOLN
INTRAVENOUS | Status: DC | PRN
  Administered 2017-06-05: 3 mg via INTRAVENOUS

## 2017-06-05 MED ORDER — ROCURONIUM BROMIDE 10 MG/ML (PF) SYRINGE
PREFILLED_SYRINGE | INTRAVENOUS | Status: AC
  Filled 2017-06-05: qty 10

## 2017-06-05 MED ORDER — POLYETHYLENE GLYCOL 3350 17 GM/SCOOP PO POWD: 1.0000 | ORAL | Status: DC

## 2017-06-05 MED ORDER — PROMETHAZINE HCL 25 MG/ML IJ SOLN: 6.2500 mg | INTRAMUSCULAR | Status: DC | PRN

## 2017-06-05 MED ORDER — ACETAMINOPHEN 650 MG RE SUPP: 650.0000 mg | Freq: Four times a day (QID) | RECTAL | Status: DC | PRN

## 2017-06-05 MED ORDER — VENLAFAXINE HCL ER 37.5 MG PO CP24
37.5000 mg | ORAL_CAPSULE | Freq: Every day | ORAL | Status: DC
  Administered 2017-06-05: 37.5 mg via ORAL
  Filled 2017-06-05 (×2): qty 1

## 2017-06-05 MED ORDER — EPHEDRINE 5 MG/ML INJ
INTRAVENOUS | Status: AC
  Filled 2017-06-05: qty 10

## 2017-06-05 MED ORDER — TAMOXIFEN CITRATE 10 MG PO TABS
20.0000 mg | ORAL_TABLET | Freq: Every day | ORAL | Status: DC
  Administered 2017-06-05: 20 mg via ORAL
  Filled 2017-06-05 (×2): qty 2

## 2017-06-05 MED ORDER — ONDANSETRON HCL 4 MG/2ML IJ SOLN
4.0000 mg | Freq: Four times a day (QID) | INTRAMUSCULAR | Status: DC | PRN
  Administered 2017-06-05 – 2017-06-06 (×2): 4 mg via INTRAVENOUS
  Filled 2017-06-05 (×2): qty 2

## 2017-06-05 MED ORDER — FENTANYL CITRATE (PF) 100 MCG/2ML IJ SOLN
100.0000 ug | Freq: Once | INTRAMUSCULAR | Status: AC
  Administered 2017-06-05: 100 ug via INTRAVENOUS

## 2017-06-05 MED ORDER — ZOLPIDEM TARTRATE 5 MG PO TABS: 5.0000 mg | ORAL_TABLET | Freq: Every evening | ORAL | Status: DC | PRN

## 2017-06-05 MED ORDER — MIDAZOLAM HCL 2 MG/2ML IJ SOLN
INTRAMUSCULAR | Status: AC
  Administered 2017-06-05: 2 mg via INTRAVENOUS
  Filled 2017-06-05: qty 2

## 2017-06-05 MED ORDER — CELECOXIB 200 MG PO CAPS
200.0000 mg | ORAL_CAPSULE | ORAL | Status: AC
  Administered 2017-06-05: 200 mg via ORAL
  Filled 2017-06-05: qty 1

## 2017-06-05 MED ORDER — OXYCODONE HCL 5 MG PO TABS
5.0000 mg | ORAL_TABLET | ORAL | Status: DC | PRN
  Administered 2017-06-05: 10 mg via ORAL
  Administered 2017-06-05 – 2017-06-06 (×2): 5 mg via ORAL
  Filled 2017-06-05 (×2): qty 1

## 2017-06-05 MED ORDER — FENTANYL CITRATE (PF) 100 MCG/2ML IJ SOLN
25.0000 ug | INTRAMUSCULAR | Status: DC | PRN
  Administered 2017-06-05 (×4): 50 ug via INTRAVENOUS

## 2017-06-05 MED ORDER — LIDOCAINE HCL (CARDIAC) 20 MG/ML IV SOLN
INTRAVENOUS | Status: AC
  Filled 2017-06-05: qty 10

## 2017-06-05 MED ORDER — LACTATED RINGERS IV SOLN: INTRAVENOUS | Status: DC

## 2017-06-05 MED ORDER — METHOCARBAMOL 500 MG PO TABS
500.0000 mg | ORAL_TABLET | Freq: Four times a day (QID) | ORAL | Status: DC | PRN
  Administered 2017-06-05: 500 mg via ORAL
  Filled 2017-06-05: qty 1

## 2017-06-05 MED ORDER — EPHEDRINE SULFATE-NACL 50-0.9 MG/10ML-% IV SOSY
PREFILLED_SYRINGE | INTRAVENOUS | Status: DC | PRN
  Administered 2017-06-05 (×5): 5 mg via INTRAVENOUS

## 2017-06-05 MED ORDER — TECHNETIUM TC 99M SULFUR COLLOID FILTERED
1.0000 | Freq: Once | INTRAVENOUS | Status: AC | PRN
  Administered 2017-06-05: 1 via INTRADERMAL

## 2017-06-05 MED ORDER — MIDAZOLAM HCL 2 MG/2ML IJ SOLN
INTRAMUSCULAR | Status: AC
  Filled 2017-06-05: qty 2

## 2017-06-05 MED ORDER — CEFAZOLIN SODIUM-DEXTROSE 2-4 GM/100ML-% IV SOLN
2.0000 g | Freq: Three times a day (TID) | INTRAVENOUS | Status: AC
  Administered 2017-06-05: 2 g via INTRAVENOUS
  Filled 2017-06-05: qty 100

## 2017-06-05 MED ORDER — FENTANYL CITRATE (PF) 100 MCG/2ML IJ SOLN
INTRAMUSCULAR | Status: AC
  Administered 2017-06-05: 100 ug via INTRAVENOUS
  Filled 2017-06-05: qty 2

## 2017-06-05 MED ORDER — DEXAMETHASONE SODIUM PHOSPHATE 10 MG/ML IJ SOLN
INTRAMUSCULAR | Status: AC
  Filled 2017-06-05: qty 1

## 2017-06-05 MED ORDER — ACETAMINOPHEN 325 MG PO TABS: 650.0000 mg | ORAL_TABLET | Freq: Four times a day (QID) | ORAL | Status: DC | PRN

## 2017-06-05 MED ORDER — ROPIVACAINE HCL 5 MG/ML IJ SOLN
INTRAMUSCULAR | Status: DC | PRN
  Administered 2017-06-05: 40 mL via PERINEURAL

## 2017-06-05 MED ORDER — PHENYLEPHRINE 40 MCG/ML (10ML) SYRINGE FOR IV PUSH (FOR BLOOD PRESSURE SUPPORT)
PREFILLED_SYRINGE | INTRAVENOUS | Status: AC
Start: 2017-06-05 — End: 2017-06-05
  Filled 2017-06-05: qty 10

## 2017-06-05 MED ORDER — MORPHINE SULFATE (PF) 4 MG/ML IV SOLN: 1.0000 mg | INTRAVENOUS | Status: DC | PRN

## 2017-06-05 MED ORDER — ROCURONIUM BROMIDE 100 MG/10ML IV SOLN
INTRAVENOUS | Status: DC | PRN
  Administered 2017-06-05: 10 mg via INTRAVENOUS
  Administered 2017-06-05: 50 mg via INTRAVENOUS

## 2017-06-05 SURGICAL SUPPLY — 68 items
ATCH SMKEVC FLXB CAUT HNDSWH (FILTER) ×1 IMPLANT
BINDER BREAST LRG (GAUZE/BANDAGES/DRESSINGS) IMPLANT
BINDER BREAST XLRG (GAUZE/BANDAGES/DRESSINGS) IMPLANT
BIOPATCH RED 1 DISK 7.0 (GAUZE/BANDAGES/DRESSINGS) ×4 IMPLANT
BNDG COHESIVE 4X5 TAN STRL (GAUZE/BANDAGES/DRESSINGS) ×2 IMPLANT
CANISTER SUCT 3000ML PPV (MISCELLANEOUS) ×2 IMPLANT
CHLORAPREP W/TINT 26ML (MISCELLANEOUS) ×2 IMPLANT
CLIP VESOCCLUDE LG 6/CT (CLIP) ×2 IMPLANT
CLIP VESOCCLUDE MED 6/CT (CLIP) ×4 IMPLANT
CLIP VESOCCLUDE SM WIDE 6/CT (CLIP) ×2 IMPLANT
CLSR STERI-STRIP ANTIMIC 1/2X4 (GAUZE/BANDAGES/DRESSINGS) ×2 IMPLANT
CONT SPEC 4OZ CLIKSEAL STRL BL (MISCELLANEOUS) ×4 IMPLANT
COVER PROBE W GEL 5X96 (DRAPES) ×2 IMPLANT
COVER SURGICAL LIGHT HANDLE (MISCELLANEOUS) ×2 IMPLANT
DERMABOND ADVANCED (GAUZE/BANDAGES/DRESSINGS) ×3
DERMABOND ADVANCED .7 DNX12 (GAUZE/BANDAGES/DRESSINGS) ×3 IMPLANT
DRAIN CHANNEL 19F RND (DRAIN) ×4 IMPLANT
DRSG PAD ABDOMINAL 8X10 ST (GAUZE/BANDAGES/DRESSINGS) ×4 IMPLANT
DRSG TEGADERM 4X4.75 (GAUZE/BANDAGES/DRESSINGS) ×4 IMPLANT
ELECT BLADE 4.0 EZ CLEAN MEGAD (MISCELLANEOUS) ×2
ELECT CAUTERY BLADE 6.4 (BLADE) ×2 IMPLANT
ELECT REM PT RETURN 9FT ADLT (ELECTROSURGICAL) ×2
ELECTRODE BLDE 4.0 EZ CLN MEGD (MISCELLANEOUS) ×1 IMPLANT
ELECTRODE REM PT RTRN 9FT ADLT (ELECTROSURGICAL) ×1 IMPLANT
EVACUATOR SILICONE 100CC (DRAIN) ×4 IMPLANT
EVACUATOR SMOKE ACCUVAC VALLEY (FILTER) ×1
FILTER STRAW FLUID ASPIR (MISCELLANEOUS) ×2 IMPLANT
GAUZE SPONGE 4X4 12PLY STRL (GAUZE/BANDAGES/DRESSINGS) ×4 IMPLANT
GLOVE BIO SURGEON STRL SZ 6 (GLOVE) ×2 IMPLANT
GLOVE BIO SURGEON STRL SZ7 (GLOVE) ×2 IMPLANT
GLOVE BIO SURGEON STRL SZ7.5 (GLOVE) ×2 IMPLANT
GLOVE BIOGEL PI IND STRL 7.0 (GLOVE) ×2 IMPLANT
GLOVE BIOGEL PI IND STRL 7.5 (GLOVE) ×1 IMPLANT
GLOVE BIOGEL PI INDICATOR 7.0 (GLOVE) ×2
GLOVE BIOGEL PI INDICATOR 7.5 (GLOVE) ×1
GLOVE ECLIPSE 7.5 STRL STRAW (GLOVE) ×2 IMPLANT
GLOVE INDICATOR 6.5 STRL GRN (GLOVE) ×2 IMPLANT
GOWN STRL REUS W/ TWL LRG LVL3 (GOWN DISPOSABLE) ×3 IMPLANT
GOWN STRL REUS W/TWL 2XL LVL3 (GOWN DISPOSABLE) ×2 IMPLANT
GOWN STRL REUS W/TWL LRG LVL3 (GOWN DISPOSABLE) ×3
KIT BASIN OR (CUSTOM PROCEDURE TRAY) ×2 IMPLANT
KIT ROOM TURNOVER OR (KITS) ×2 IMPLANT
LIGHT WAVEGUIDE WIDE FLAT (MISCELLANEOUS) ×2 IMPLANT
MARKER SKIN DUAL TIP RULER LAB (MISCELLANEOUS) ×2 IMPLANT
NEEDLE 18GX1X1/2 (RX/OR ONLY) (NEEDLE) ×2 IMPLANT
NEEDLE HYPO 25GX1X1/2 BEV (NEEDLE) IMPLANT
NEEDLE SPNL 22GX3.5 QUINCKE BK (NEEDLE) ×2 IMPLANT
NS IRRIG 1000ML POUR BTL (IV SOLUTION) ×2 IMPLANT
PACK GENERAL/GYN (CUSTOM PROCEDURE TRAY) ×2 IMPLANT
PACK UNIVERSAL I (CUSTOM PROCEDURE TRAY) ×2 IMPLANT
PAD ARMBOARD 7.5X6 YLW CONV (MISCELLANEOUS) ×2 IMPLANT
PREFILTER EVAC NS 1 1/3-3/8IN (MISCELLANEOUS) ×2 IMPLANT
SPECIMEN JAR X LARGE (MISCELLANEOUS) ×2 IMPLANT
SPONGE LAP 18X18 X RAY DECT (DISPOSABLE) ×2 IMPLANT
STAPLER VISISTAT 35W (STAPLE) ×2 IMPLANT
STOCKINETTE IMPERVIOUS 9X36 MD (GAUZE/BANDAGES/DRESSINGS) ×2 IMPLANT
STRIP CLOSURE SKIN 1/2X4 (GAUZE/BANDAGES/DRESSINGS) ×2 IMPLANT
SUT ETHILON 2 0 FS 18 (SUTURE) ×4 IMPLANT
SUT MON AB 4-0 PC3 18 (SUTURE) ×4 IMPLANT
SUT SILK 2 0 (SUTURE) ×2
SUT SILK 2 0 PERMA HAND 18 BK (SUTURE) ×2 IMPLANT
SUT SILK 2-0 18XBRD TIE 12 (SUTURE) ×2 IMPLANT
SUT VIC AB 3-0 SH 8-18 (SUTURE) ×4 IMPLANT
SYR 50ML LL SCALE MARK (SYRINGE) ×2 IMPLANT
SYR CONTROL 10ML LL (SYRINGE) IMPLANT
TOWEL OR 17X24 6PK STRL BLUE (TOWEL DISPOSABLE) ×2 IMPLANT
TOWEL OR 17X26 10 PK STRL BLUE (TOWEL DISPOSABLE) ×2 IMPLANT
TUBE CONNECTING 12X1/4 (SUCTIONS) ×2 IMPLANT

## 2017-06-05 NOTE — Discharge Instructions (Signed)
[facility] °[phone] ° °MASTECTOMY: POST OP INSTRUCTIONS ° °Always review your discharge instruction sheet given to you by the facility where your surgery was performed. °IF YOU HAVE DISABILITY OR FAMILY LEAVE FORMS, YOU MUST BRING THEM TO THE OFFICE FOR PROCESSING.   °DO NOT GIVE THEM TO YOUR DOCTOR. °A prescription for pain medication may be given to you upon discharge.  Take your pain medication as prescribed, if needed.  If narcotic pain medicine is not needed, then you may take acetaminophen (Tylenol) or ibuprofen (Advil) as needed. °1. Take your usually prescribed medications unless otherwise directed. °2. If you need a refill on your pain medication, please contact your pharmacy.  They will contact our office to request authorization.  Prescriptions will not be filled after 5pm or on week-ends. °3. You should follow a light diet the first few days after arrival home, such as soup and crackers, etc.  Resume your normal diet the day after surgery. °4. Most patients will experience some swelling and bruising on the chest and underarm.  Ice packs will help.  Swelling and bruising can take several days to resolve.  °5. It is common to experience some constipation if taking pain medication after surgery.  Increasing fluid intake and taking a stool softener (such as Colace) will usually help or prevent this problem from occurring.  A mild laxative (Milk of Magnesia or Miralax) should be taken according to package instructions if there are no bowel movements after 48 hours. °6. Unless discharge instructions indicate otherwise, leave your bandage dry and in place until your next appointment in 3-5 days.  You may take a limited sponge bath.  No tube baths or showers until the drains are removed.  You may have steri-strips (small skin tapes) in place directly over the incision.  These strips should be left on the skin for 7-10 days.  If your surgeon used skin glue on the incision, you may  shower in 24 hours.  The glue will flake off over the next 2-3 weeks.  Any sutures or staples will be removed at the office during your follow-up visit. °7. DRAINS:  If you have drains in place, it is important to keep a list of the amount of drainage produced each day in your drains.  Before leaving the hospital, you should be instructed on drain care.  Call our office if you have any questions about your drains. °8. ACTIVITIES:  You may resume regular (light) daily activities beginning the next day--such as daily self-care, walking, climbing stairs--gradually increasing activities as tolerated.  You may have sexual intercourse when it is comfortable.  Refrain from any heavy lifting or straining until approved by your doctor. °a. You may drive when you are no longer taking prescription pain medication, you can comfortably wear a seatbelt, and you can safely maneuver your car and apply brakes. °b. RETURN TO WORK:  __________________________________________________________ °9. You should see your doctor in the office for a follow-up appointment approximately 3-5 days after your surgery.  Your doctor’s nurse will typically make your follow-up appointment when she calls you with your pathology report.  Expect your pathology report 2-3 business days after your surgery.  You may call to check if you do not hear from us after three days.   °10. OTHER INSTRUCTIONS: ______________________________________________________________________________________________ ____________________________________________________________________________________________ °WHEN TO CALL YOUR DOCTOR: °1. Fever over 101.0 °2. Nausea and/or vomiting °3. Extreme swelling or bruising °4. Continued bleeding from incision. °5. Increased pain, redness, or drainage from the incision. °  The clinic staff is available to answer your questions during regular business hours.  Please don’t hesitate to call and ask to speak to one of the nurses for clinical  concerns.  If you have a medical emergency, go to the nearest emergency room or call 911.  A surgeon from [facility] is always on call at the hospital. °[address], [address], [place], [place]  [address] ? [address] [address], [place], [place]   [address] °[phone] ? [phone] ? FAX [phone] °Web site: [email or web address] °

## 2017-06-05 NOTE — Anesthesia Preprocedure Evaluation (Addendum)
Anesthesia Evaluation  Patient identified by MRN, date of birth, ID band Patient awake    Reviewed: Allergy & Precautions, NPO status   Airway Mallampati: I  TM Distance: >3 FB Neck ROM: Full    Dental   Pulmonary former smoker,    breath sounds clear to auscultation       Cardiovascular negative cardio ROS   Rhythm:Regular Rate:Normal     Neuro/Psych negative neurological ROS     GI/Hepatic negative GI ROS, Neg liver ROS,   Endo/Other  diabetes, Insulin Dependent  Renal/GU negative Renal ROS     Musculoskeletal   Abdominal   Peds  Hematology negative hematology ROS (+)   Anesthesia Other Findings   Reproductive/Obstetrics                             Anesthesia Physical  Anesthesia Plan  ASA: III  Anesthesia Plan: General   Post-op Pain Management: GA combined w/ Regional for post-op pain   Induction: Intravenous  PONV Risk Score and Plan: 3 and Ondansetron, Dexamethasone and Midazolam  Airway Management Planned: Oral ETT  Additional Equipment:   Intra-op Plan:   Post-operative Plan: Extubation in OR  Informed Consent: I have reviewed the patients History and Physical, chart, labs and discussed the procedure including the risks, benefits and alternatives for the proposed anesthesia with the patient or authorized representative who has indicated his/her understanding and acceptance.   Dental advisory given  Plan Discussed with: CRNA  Anesthesia Plan Comments:        Anesthesia Quick Evaluation

## 2017-06-05 NOTE — Interval H&P Note (Signed)
History and Physical Interval Note:  06/05/2017 11:52 AM  Stephanie Holden  has presented today for surgery, with the diagnosis of RIGHT BREAST CANCER  The various methods of treatment have been discussed with the patient and family. After consideration of risks, benefits and other options for treatment, the patient has consented to  Procedure(s): BILATERAL MASTECTOMIES  WITH RIGHT SENTINEL LYMPH NODE BIOPSY (Bilateral) as a surgical intervention .  The patient's history has been reviewed, patient examined, no change in status, stable for surgery.  I have reviewed the patient's chart and labs.  Questions were answered to the patient's satisfaction.     [name]

## 2017-06-05 NOTE — Transfer of Care (Signed)
Immediate Anesthesia Transfer of Care Note  Patient: Stephanie Holden  Procedure(s) Performed: BILATERAL MASTECTOMIES  WITH RIGHT SENTINEL LYMPH NODE BIOPSY (Bilateral Breast)  Patient Location: PACU  Anesthesia Type:GA combined with regional for post-op pain  Level of Consciousness: awake, alert  and oriented  Airway & Oxygen Therapy: Patient Spontanous Breathing and Patient connected to nasal cannula oxygen  Post-op Assessment: Report given to RN and Post -op Vital signs reviewed and stable  Post vital signs: Reviewed and stable  Last Vitals:  Vitals:   06/05/17 1541 06/05/17 1545  BP: (!) (P) 151/66 (!) 151/66  Pulse: (P) 83 81  Resp: (P) 15 17  Temp: (P) 36.8 C   SpO2: (P) 100% 100%    Last Pain:  Vitals:   06/05/17 1541  TempSrc:   PainSc: (P) 0-No pain      Patients Stated Pain Goal: 3 (32/99/24 2683)  Complications: No apparent anesthesia complications

## 2017-06-05 NOTE — Anesthesia Postprocedure Evaluation (Signed)
Anesthesia Post Note  Patient: Stephanie Holden  Procedure(s) Performed: BILATERAL MASTECTOMIES  WITH RIGHT SENTINEL LYMPH NODE BIOPSY (Bilateral Breast)     Patient location during evaluation: PACU Anesthesia Type: General and Regional Level of consciousness: awake and alert Pain management: pain level controlled Vital Signs Assessment: post-procedure vital signs reviewed and stable Respiratory status: spontaneous breathing, nonlabored ventilation, respiratory function stable and patient connected to nasal cannula oxygen Cardiovascular status: blood pressure returned to baseline and stable Postop Assessment: no apparent nausea or vomiting Anesthetic complications: no    Last Vitals:  Vitals:   06/05/17 1615 06/05/17 1630  BP: (!) 158/60 128/81  Pulse: (!) 55 74  Resp: 14 18  Temp:    SpO2: 100% 100%    Last Pain:  Vitals:   06/05/17 1630  TempSrc:   PainSc: 6                  Artia Singley

## 2017-06-05 NOTE — Anesthesia Procedure Notes (Signed)
Anesthesia Regional Block: Pectoralis block   Pre-Anesthetic Checklist: ,, timeout performed, Correct Patient, Correct Site, Correct Laterality, Correct Procedure, Correct Position, site marked, Risks and benefits discussed,  Surgical consent,  Pre-op evaluation,  At surgeon's request and post-op pain management  Laterality: Left and Right  Prep: chloraprep       Needles:  Injection technique: Single-shot  Needle Type: Stimiplex     Needle Length: 9cm  Needle Gauge: 21     Additional Needles:   Procedures:,,,, ultrasound used (permanent image in chart),,,,  Narrative:  Start time: 06/05/2017 10:57 AM End time: 06/05/2017 11:02 AM Injection made incrementally with aspirations every 5 mL.  Performed by: Personally  Anesthesiologist: Lynda Rainwater, MD

## 2017-06-05 NOTE — Op Note (Signed)
Bilateral Mastectomy with Right Sentinel Node Mapping and biopsy  Indications: This patient presents with history of right breast cancer, axillary tail, cT1cN1M0, grade 1, +/+/- Pt presented with palpable right axillary lymph node and mammogram/us negative for cancer.  MRI identified multiple foci of cancer on the right.  She desires symmetry and has high risk of occult cancer and recurrence   Pre-operative Diagnosis: Right breast cancer  Post-operative Diagnosis: same  Surgeon: [name]   Anesthesia: General endotracheal anesthesia and pectoral block  ASA Class: III  Procedure Details  The patient was seen in the Holding Room. The risks, benefits, complications, treatment options, and expected outcomes were discussed with the patient. The possibilities of reaction to medication, pulmonary aspiration, bleeding, infection, the need for additional procedures, failure to diagnose a condition, and creating a complication requiring transfusion or operation were discussed with the patient. The patient concurred with the proposed plan, giving informed consent.  The site of surgery properly noted/marked. The patient was taken to Operating Room # 9, identified as Tequita Savoia and the procedure verified as Bilateral Mastectomy and with right sentinel node biopsy. A Time Out was held and the above information confirmed.    After induction of anesthesia, the bilateral breast and chest were prepped and draped in standard fashion.  The methylene blue was injected in the right subareolar location.    The left breast (benign) was addressed first.  The borders of the breast were identified and marked.  The incisions of the breast was drawn out to make sure incision lines were equidistant in length.    The superior incision was made with the #10 blade.  Mastectomy hooks were used to provide elevation of the skin edges, and the curved Mayo scissors were used to create the mastectomy flaps.  The dissection was  taken to the fascia of the pectoralis major.  The penetrating vessels were clipped.  The superior flap was taken medially to the lateral sternal border, superiorly to the inferior border of the clavicle.  The inferior flap was similarly created, inferiorly to the inframammary fold and laterally to the border of the latissimus.  The breast was taken off including the pectoralis fascia and the axillary tail marked.    The right side was then addressed similarly.  Using a hand-held gamma probe, axillary sentinel nodes were identified. The palpable node was identified and removed.  This node was hot.  There was a palpable node right under that which was blue.  A third deep level 2 axillary sentinel node was also removed.  These were evaluated with the faxatron and the biopsy clip was present in it.  There were an additional three tiny level 2 nodes that were hot.  The findings are below.  The lymphovascular channels were clipped with metal clips.        The wounds were irrigated. One 19 Blake drain was placed laterally on each side.   Hemostasis was achieved with cautery.  The wounds were closed with a 3-0 Vicryl deep dermal interrupted sutures and 4-0 Vicryl subcuticular closure in layers.    Sterile dressings were applied. At the end of the operation, all sponge, instrument, and needle counts were correct.  Findings: grossly clear surgical margins. Right sentinel lymph nodes were 230 at the hottest.  The second one was blue only.  The third one had 60 cps.  The fourth through sixth nodes were between 150 and 180 cps.    Estimated Blood Loss: min  Drains: 19 Fr blake drain in Right axilla and the left axilla                Specimens: right breast, left breast,  and six right axillary sentinel nodes         Complications:  None; patient tolerated the procedure well.         Disposition: PACU - hemodynamically stable.         Condition: stable

## 2017-06-05 NOTE — Progress Notes (Signed)
Inpatient Diabetes Program Recommendations  AACE/ADA: New Consensus Statement on Inpatient Glycemic Control (2015)  Target Ranges:  Prepandial:   less than 140 mg/dL      Peak postprandial:   less than 180 mg/dL (1-2 hours)      Critically ill patients:  140 - 180 mg/dL   Lab Results  Component Value Date   GLUCAP 150 (H) 06/05/2017    Called and spoke with RN on 6N.  She states that patient does have insulin pump restarted. Discussed insulin pump order set and flowsheet.  She states that she will call MD for orders.   Thanks,  Adah Perl, RN, BC-ADM Inpatient Diabetes Coordinator

## 2017-06-05 NOTE — Anesthesia Procedure Notes (Signed)
Procedure Name: Intubation Date/Time: 06/05/2017 12:09 PM Performed by: Candis Shine, CRNA Pre-anesthesia Checklist: Patient identified, Emergency Drugs available, Suction available and Patient being monitored Patient Re-evaluated:Patient Re-evaluated prior to induction Oxygen Delivery Method: Circle System Utilized Preoxygenation: Pre-oxygenation with 100% oxygen Induction Type: IV induction Ventilation: Mask ventilation without difficulty Laryngoscope Size: Mac and 3 Grade View: Grade I Tube type: Oral Tube size: 7.0 mm Number of attempts: 1 Airway Equipment and Method: Stylet Placement Confirmation: ETT inserted through vocal cords under direct vision,  positive ETCO2 and breath sounds checked- equal and bilateral Secured at: 22 cm Tube secured with: Tape Dental Injury: Teeth and Oropharynx as per pre-operative assessment

## 2017-06-05 NOTE — Progress Notes (Signed)
  Briefly talked with patient and husband in short-stay.  She has reduced her basal rates by 20% at midnight.  Blood sugar=222 mg/dL this morning. We discussed that IV insulin will be started for during procedure and until she is awake.  Once she is awake and alert and oriented,  she can restart insulin pump.  She has supplies with her.  Will need to overlap IV insulin with insulin pump for 1 hour.  Will follow.  Thanks,  Adah Perl, RN, BC-ADM Inpatient Diabetes Coordinator Pager [phone] (8a-5p)

## 2017-06-06 ENCOUNTER — Encounter: Payer: Self-pay | Admitting: Radiation Oncology

## 2017-06-06 ENCOUNTER — Encounter (HOSPITAL_COMMUNITY): Payer: Self-pay | Admitting: General Surgery

## 2017-06-06 DIAGNOSIS — C773 Secondary and unspecified malignant neoplasm of axilla and upper limb lymph nodes: Secondary | ICD-10-CM | POA: Diagnosis not present

## 2017-06-06 DIAGNOSIS — Z87891 Personal history of nicotine dependence: Secondary | ICD-10-CM | POA: Diagnosis not present

## 2017-06-06 DIAGNOSIS — Z79810 Long term (current) use of selective estrogen receptor modulators (SERMs): Secondary | ICD-10-CM | POA: Diagnosis not present

## 2017-06-06 DIAGNOSIS — Z794 Long term (current) use of insulin: Secondary | ICD-10-CM | POA: Diagnosis not present

## 2017-06-06 DIAGNOSIS — C50911 Malignant neoplasm of unspecified site of right female breast: Secondary | ICD-10-CM | POA: Diagnosis not present

## 2017-06-06 DIAGNOSIS — E109 Type 1 diabetes mellitus without complications: Secondary | ICD-10-CM | POA: Diagnosis not present

## 2017-06-06 DIAGNOSIS — C50411 Malignant neoplasm of upper-outer quadrant of right female breast: Secondary | ICD-10-CM | POA: Diagnosis not present

## 2017-06-06 LAB — CBC
HCT: 31 % — ABNORMAL LOW (ref 36.0–46.0)
HEMOGLOBIN: 10.8 g/dL — AB (ref 12.0–15.0)
MCH: 30.3 pg (ref 26.0–34.0)
MCHC: 34.8 g/dL (ref 30.0–36.0)
MCV: 86.8 fL (ref 78.0–100.0)
Platelets: 248 10*3/uL (ref 150–400)
RBC: 3.57 MIL/uL — AB (ref 3.87–5.11)
RDW: 13.6 % (ref 11.5–15.5)
WBC: 12.7 10*3/uL — ABNORMAL HIGH (ref 4.0–10.5)

## 2017-06-06 LAB — BASIC METABOLIC PANEL
Anion gap: 8 (ref 5–15)
BUN: 9 mg/dL (ref 6–20)
CHLORIDE: 101 mmol/L (ref 101–111)
CO2: 20 mmol/L — ABNORMAL LOW (ref 22–32)
CREATININE: 0.86 mg/dL (ref 0.44–1.00)
Calcium: 7.6 mg/dL — ABNORMAL LOW (ref 8.9–10.3)
GFR calc Af Amer: >60 mL/min (ref >60)
GFR calc non Af Amer: >60 mL/min (ref >60)
Glucose, Bld: 255 mg/dL — ABNORMAL HIGH (ref 65–99)
POTASSIUM: 4.3 mmol/L (ref 3.5–5.1)
SODIUM: 129 mmol/L — AB (ref 135–145)

## 2017-06-06 LAB — GLUCOSE, CAPILLARY
Glucose-Capillary: 209 mg/dL — ABNORMAL HIGH (ref 65–99)
Glucose-Capillary: 249 mg/dL — ABNORMAL HIGH (ref 65–99)

## 2017-06-06 MED ORDER — METHOCARBAMOL 500 MG PO TABS: 500.0000 mg | ORAL_TABLET | Freq: Four times a day (QID) | ORAL | 1 refills | Status: DC | PRN

## 2017-06-06 MED ORDER — GABAPENTIN 300 MG PO CAPS: 300.0000 mg | ORAL_CAPSULE | Freq: Two times a day (BID) | ORAL | 0 refills | Status: DC

## 2017-06-06 MED ORDER — OXYCODONE HCL 5 MG PO TABS: 5.0000 mg | ORAL_TABLET | ORAL | 0 refills | Status: DC | PRN

## 2017-06-06 NOTE — Discharge Summary (Signed)
Physician Discharge Summary  Patient ID: Stephanie Holden MRN: [id number] DOB/AGE: 1976-02-07 42 y.o.  Admit date: 06/05/2017 Discharge date: 06/06/2017  Admission Diagnoses: Patient Active Problem List   Diagnosis Date Noted  . Breast cancer metastasized to axillary lymph node, right ([place]) 06/05/2017  . Genetic testing 05/27/2017  . Malignant neoplasm of axillary tail of right breast in female, estrogen receptor positive ([place]) 05/09/2017  . Cubital tunnel syndrome on left 10/02/2015  . Trigger finger, left index finger 04/12/2015  . Erroneous encounter - disregard 09/22/2014  . Diabetes type 1, controlled ([place]) 01/12/2013    Discharge Diagnoses:  Active Problems:   Breast cancer metastasized to axillary lymph node, right ([place]) and same as above.    Discharged Condition: stable  Hospital Course:  Pt was admitted to the floor following bilateral mastectomy and right sentinel lymph node biopsy.  She was stable overnight.  She had a tolerable level of pain with oral medication.  She was ambulatory.  She was voiding spontaneously.  She originally was nauseated post anesthesia, but this resolved and she was able to eat.  Her family demonstrated adequate knowledge of drain management.    Consults: None  Significant Diagnostic Studies: labs: see epic for full list.  HCT 31.0 prior to d/c.    Treatments: surgery: see above.    Discharge Exam: Blood pressure 132/61, pulse 80, temperature 98.5 F (36.9 C), temperature source Oral, resp. rate 18, height 5\' 2"  (1.575 m), weight 75.4 kg (166 lb 3.6 oz), last menstrual period 05/12/2017, SpO2 100 %. General appearance: alert, cooperative and no distress Resp: breathing comfortably Chest wall: bilateral chest wall tenderness as expected.  some bruising.   Extremities: extremities normal, atraumatic, no cyanosis or edema  Disposition: 01-Home or Self Care  Discharge Instructions    Call MD for:  difficulty breathing, headache or visual  disturbances   Complete by:  As directed    Call MD for:  persistant nausea and vomiting   Complete by:  As directed    Call MD for:  redness, tenderness, or signs of infection (pain, swelling, redness, odor or green/yellow discharge around incision site)   Complete by:  As directed    Call MD for:  severe uncontrolled pain   Complete by:  As directed    Call MD for:  temperature >100.4   Complete by:  As directed    Change dressing (specify)   Complete by:  As directed    Breast binder or surgical bra/camisole for 1 week even to sleep and afterward as needed.  Measure and record drain output twice daily and additionally as needed.  Bring the record to clinic.   Diet - low sodium heart healthy   Complete by:  As directed    Increase activity slowly   Complete by:  As directed      Allergies as of 06/06/2017      Reactions   Dilaudid [hydromorphone Hcl] Itching, Other (See Comments)   UNCONTROLLABLE CRYING      Medication List    TAKE these medications   gabapentin 300 MG capsule Commonly known as:  NEURONTIN Take 1 capsule (300 mg total) by mouth 2 (two) times daily.   methocarbamol 500 MG tablet Commonly known as:  ROBAXIN Take 1 tablet (500 mg total) by mouth every 6 (six) hours as needed for muscle spasms.   MIRALAX PO Take by mouth. Taking every other day   naproxen sodium 220 MG tablet Commonly known as:  ALEVE Take  440 mg by mouth daily as needed (for pain or headache).   NOVOLOG 100 UNIT/ML injection Generic drug:  insulin aspart Inject 40 Units into the skin See admin instructions. Use via insulin pump up to 40 units daily   oxyCODONE 5 MG immediate release tablet Commonly known as:  Oxy IR/ROXICODONE Take 1-2 tablets (5-10 mg total) by mouth every 4 (four) hours as needed for moderate pain.   PROBIOTIC DAILY PO Take 1 capsule by mouth daily.   tamoxifen 20 MG tablet Commonly known as:  NOLVADEX Take 1 tablet (20 mg total) by mouth daily.    venlafaxine XR 37.5 MG 24 hr capsule Commonly known as:  EFFEXOR-XR Take 1 capsule (37.5 mg total) by mouth daily with breakfast.            Discharge Care Instructions  (From admission, onward)        Start     Ordered   06/06/17 0000  Change dressing (specify)    Comments:  Breast binder or surgical bra/camisole for 1 week even to sleep and afterward as needed.  Measure and record drain output twice daily and additionally as needed.  Bring the record to clinic.   06/06/17 1010     Follow-up Information    [name], MD In 2 weeks.   Specialty:  General Surgery Contact information: [address] [place] [address] [phone]           Signed: [name] 06/06/2017, 10:10 AM

## 2017-06-06 NOTE — Progress Notes (Signed)
Discharge instruction reviewed with the Pt and her family.  Education provided to the Pt and husband on care to the JP drain - including how to drain the JP drain - husband was able to demonstrate back and verbalized back education .  Pt was given her d/c instruction, appointments and location to pick up her prescriptions   Pt ambulated with family off the unit.

## 2017-06-09 DIAGNOSIS — E109 Type 1 diabetes mellitus without complications: Secondary | ICD-10-CM | POA: Diagnosis not present

## 2017-06-10 LAB — GLUCOSE, CAPILLARY: Glucose-Capillary: 123 mg/dL — ABNORMAL HIGH (ref 65–99)

## 2017-06-12 ENCOUNTER — Other Ambulatory Visit: Payer: Self-pay | Admitting: *Deleted

## 2017-06-12 ENCOUNTER — Encounter: Payer: Self-pay | Admitting: *Deleted

## 2017-06-12 ENCOUNTER — Inpatient Hospital Stay: Payer: BLUE CROSS/BLUE SHIELD | Attending: Hematology and Oncology | Admitting: Hematology and Oncology

## 2017-06-12 DIAGNOSIS — C50611 Malignant neoplasm of axillary tail of right female breast: Secondary | ICD-10-CM | POA: Insufficient documentation

## 2017-06-12 DIAGNOSIS — Z006 Encounter for examination for normal comparison and control in clinical research program: Secondary | ICD-10-CM

## 2017-06-12 DIAGNOSIS — C773 Secondary and unspecified malignant neoplasm of axilla and upper limb lymph nodes: Secondary | ICD-10-CM | POA: Diagnosis not present

## 2017-06-12 DIAGNOSIS — Z170 Estrogen receptor positive status [ER+]: Secondary | ICD-10-CM | POA: Insufficient documentation

## 2017-06-12 NOTE — Assessment & Plan Note (Addendum)
06/05/2017:Bilateral mastectomies: Right mastectomy: IDC grade 1, 3 foci 1.2 cm, 1.2 cm, 1 cm, IgA DCIS, LVH diffuse present, margins negative, 4/7 lymph nodes positive with extracapsular extension, ER 95%, PR 95%, HER-2 negative, Ki-67 2% to 10%, T1 cN2 aM0 stage II a pathological stage; left mastectomy: Benign  Mammaprint done preoperatively: Low risk Pathology counseling: I discussed the final pathology report of the patient provided  a copy of this report. I discussed the margins as well as lymph node surgeries. We also discussed the final staging along with previously performed ER/PR and HER-2/neu testing.  Recommendation: 1.  I discussed with the patient had Mammaprint is not a valid test for anyone with 4+ lymph nodes. 2. Given her age and her high risk features of the breast cancer, recommended adjuvant systemic chemotherapy.  We will plan to give dose dense Adriamycin and Cytoxan x4 followed by Taxol weekly x12 3. Followed by radiation.  4.  Followed by adjuvant antiestrogen therapy   Chemotherapy Counseling: I discussed the risks and benefits of chemotherapy including the risks of nausea/ vomiting, risk of infection from low WBC count, fatigue due to chemo or anemia, bruising or bleeding due to low platelets, mouth sores, loss/ change in taste and decreased appetite. Liver and kidney function will be monitored through out chemotherapy as abnormalities in liver and kidney function may be a side effect of treatment. Cardiac dysfunction due to Adriamycin was discussed in detail. Risk of permanent bone marrow dysfunction and leukemia due to chemo were also discussed.  UPBEAT clinical trial ([id number]): Newly diagnosed stage I to III breast cancer patients receiving either adjuvant or neoadjuvant chemotherapy undergo cardiac MRI before treatment and at 24 months along with neurocognitive testing, exercise and disability measures at baseline 3, 12 and 24 months.  Return to clinic in 3 weeks to start  chemotherapy

## 2017-06-12 NOTE — Progress Notes (Signed)
Patient Care Team: Tisovec, Fransico Him, MD as PCP - General (Internal Medicine)  DIAGNOSIS:  Encounter Diagnosis  Name Primary?  . Malignant neoplasm of axillary tail of right breast in female, estrogen receptor positive ([place])     SUMMARY OF ONCOLOGIC HISTORY:   Malignant neoplasm of axillary tail of right breast in female, estrogen receptor positive ([place])   05/09/2017 Initial Diagnosis    May 2018: Palpable right axillary mass.  The recommended a 6-week follow-up but apparently the mass decreased in size.  Recent increase in the mass was noted, biopsy revealed Grade 1 IDC with DCIS, lymphovascular invasion present, 4 small hypoechoic masses largest 0.9 cm, T1BN1 stage Ib clinical stage      06/05/2017 Surgery    Bilateral mastectomies: Right mastectomy: IDC grade 1, 3 foci 1.2 cm, 1.2 cm, 1 cm, IgA DCIS, LVH diffuse present, margins negative, 4/7 lymph nodes positive with extracapsular extension, ER 95%, PR 95%, HER-2 negative, Ki-67 2% to 10%, T1 cN2 aM0 stage II a pathological stage; left mastectomy: Benign       CHIEF COMPLIANT: Follow-up to discuss the pathology report  INTERVAL HISTORY: Stephanie Holden is a 42 year old with above-mentioned history of right breast cancer underwent bilateral mastectomies and is here today to discuss the pathology report.  She has bilateral drains that they are bothering her significantly.  She does have some pain and discomfort but there is very well managed.  REVIEW OF SYSTEMS:   Constitutional: Denies fevers, chills or abnormal weight loss Eyes: Denies blurriness of vision Ears, nose, mouth, throat, and face: Denies mucositis or sore throat Respiratory: Denies cough, dyspnea or wheezes Cardiovascular: Denies palpitation, chest discomfort Gastrointestinal:  Denies nausea, heartburn or change in bowel habits Skin: Denies abnormal skin rashes Lymphatics: Denies new lymphadenopathy or easy bruising Neurological:Denies numbness, tingling or new  weaknesses Behavioral/Psych: Mood is stable, no new changes  Extremities: No lower extremity edema Breast: Bilateral mastectomies All other systems were reviewed with the patient and are negative.  I have reviewed the past medical history, past surgical history, social history and family history with the patient and they are unchanged from previous note.  ALLERGIES:  is allergic to dilaudid [hydromorphone hcl].  MEDICATIONS:  Current Outpatient Medications  Medication Sig Dispense Refill  . gabapentin (NEURONTIN) 300 MG capsule Take 1 capsule (300 mg total) by mouth 2 (two) times daily. 30 capsule 0  . insulin aspart (NOVOLOG) 100 UNIT/ML injection Inject 40 Units into the skin See admin instructions. Use via insulin pump up to 40 units daily    . methocarbamol (ROBAXIN) 500 MG tablet Take 1 tablet (500 mg total) by mouth every 6 (six) hours as needed for muscle spasms. 30 tablet 1  . naproxen sodium (ANAPROX) 220 MG tablet Take 440 mg by mouth daily as needed (for pain or headache).     . oxyCODONE (OXY IR/ROXICODONE) 5 MG immediate release tablet Take 1-2 tablets (5-10 mg total) by mouth every 4 (four) hours as needed for moderate pain. 30 tablet 0  . Polyethylene Glycol 3350 (MIRALAX PO) Take by mouth. Taking every other day    . Probiotic Product (PROBIOTIC DAILY PO) Take 1 capsule by mouth daily.     . tamoxifen (NOLVADEX) 20 MG tablet Take 1 tablet (20 mg total) by mouth daily. 30 tablet 1  . venlafaxine XR (EFFEXOR-XR) 37.5 MG 24 hr capsule Take 1 capsule (37.5 mg total) by mouth daily with breakfast. 30 capsule 3   No current facility-administered medications  for this visit.     PHYSICAL EXAMINATION: ECOG PERFORMANCE STATUS: 1 - Symptomatic but completely ambulatory  Vitals:   06/12/17 1055  BP: (!) 150/83  Pulse: 73  Resp: 18  Temp: 98.9 F (37.2 C)  SpO2: 100%   Filed Weights   06/12/17 1055  Weight: 166 lb 8 oz (75.5 kg)    GENERAL:alert, no distress and  comfortable SKIN: skin color, texture, turgor are normal, no rashes or significant lesions EYES: normal, Conjunctiva are pink and non-injected, sclera clear OROPHARYNX:no exudate, no erythema and lips, buccal mucosa, and tongue normal  NECK: supple, thyroid normal size, non-tender, without nodularity LYMPH:  no palpable lymphadenopathy in the cervical, axillary or inguinal LUNGS: clear to auscultation and percussion with normal breathing effort HEART: regular rate & rhythm and no murmurs and no lower extremity edema ABDOMEN:abdomen soft, non-tender and normal bowel sounds MUSCULOSKELETAL:no cyanosis of digits and no clubbing  NEURO: alert & oriented x 3 with fluent speech, no focal motor/sensory deficits EXTREMITIES: No lower extremity edema   LABORATORY DATA:  I have reviewed the data as listed CMP Latest Ref Rng & Units 06/06/2017 06/05/2017 05/14/2017  Glucose 65 - 99 mg/dL 255(H) 233(H) 187(H)  BUN 6 - 20 mg/dL _0 Creatinine 0.44 - 1.00 mg/dL 0.86 0.91 1.07  Sodium 135 - 145 mmol/L 129(L) 136 139  Potassium 3.5 - 5.1 mmol/L 4.3 4.5 4.0  Chloride 101 - 111 mmol/L 101 104 104  CO2 22 - 32 mmol/L 20(L) 20(L) 26  Calcium 8.9 - 10.3 mg/dL 7.6(L) 8.9 8.9  Total Protein 6.4 - 8.3 g/dL - - 7.0  Total Bilirubin 0.2 - 1.2 mg/dL - - 0.8  Alkaline Phos 40 - 150 U/L - - 81  AST 5 - 34 U/L - - 15  ALT 0 - 55 U/L - - 10    Lab Results  Component Value Date   WBC 12.7 (H) 06/06/2017   HGB 10.8 (L) 06/06/2017   HCT 31.0 (L) 06/06/2017   MCV 86.8 06/06/2017   PLT 248 06/06/2017   NEUTROABS 5.5 05/14/2017    ASSESSMENT & PLAN:  Malignant neoplasm of axillary tail of right breast in female, estrogen receptor positive ([place]) 06/05/2017:Bilateral mastectomies: Right mastectomy: IDC grade 1, 3 foci 1.2 cm, 1.2 cm, 1 cm, IgA DCIS, LVH diffuse present, margins negative, 4/7 lymph nodes positive with extracapsular extension, ER 95%, PR 95%, HER-2 negative, Ki-67 2% to 10%, T1 cN2 aM0 stage II a  pathological stage; left mastectomy: Benign  Mammaprint done preoperatively: Low risk Pathology counseling: I discussed the final pathology report of the patient provided  a copy of this report. I discussed the margins as well as lymph node surgeries. We also discussed the final staging along with previously performed ER/PR and HER-2/neu testing.  Recommendation: 1.  I discussed with the patient had Mammaprint is not a valid test for anyone with 4+ lymph nodes. 2. Given her age and her high risk features of the breast cancer, recommended adjuvant systemic chemotherapy.  We will plan to give dose dense Adriamycin and Cytoxan x4 followed by Taxol weekly x12 3. Followed by radiation.  4.  Followed by adjuvant antiestrogen therapy   Chemotherapy Counseling: I discussed the risks and benefits of chemotherapy including the risks of nausea/ vomiting, risk of infection from low WBC count, fatigue due to chemo or anemia, bruising or bleeding due to low platelets, mouth sores, loss/ change in taste and decreased appetite. Liver and kidney function will be  monitored through out chemotherapy as abnormalities in liver and kidney function may be a side effect of treatment. Cardiac dysfunction due to Adriamycin was discussed in detail. Risk of permanent bone marrow dysfunction and leukemia due to chemo were also discussed.  UPBEAT clinical trial ([id number]): Newly diagnosed stage I to III breast cancer patients receiving either adjuvant or neoadjuvant chemotherapy undergo cardiac MRI before treatment and at 24 months along with neurocognitive testing, exercise and disability measures at baseline 3, 12 and 24 months.  Return to clinic in 3-4 weeks to start chemotherapy She will need a port, echocardiogram and chemo class   I spent 25 minutes talking to the patient of which more than half was spent in counseling and coordination of care.  No orders of the defined types were placed in this encounter.  The  patient has a good understanding of the overall plan. she agrees with it. she will call with any problems that may develop before the next visit here.   Harriette Ohara, MD 06/12/17

## 2017-06-13 ENCOUNTER — Telehealth: Payer: Self-pay | Admitting: Hematology and Oncology

## 2017-06-13 NOTE — Telephone Encounter (Signed)
Left message for patient regarding upcoming April and may appointments.

## 2017-06-16 ENCOUNTER — Other Ambulatory Visit: Payer: Self-pay | Admitting: General Surgery

## 2017-06-18 ENCOUNTER — Encounter (HOSPITAL_COMMUNITY): Payer: Self-pay

## 2017-06-18 ENCOUNTER — Other Ambulatory Visit (HOSPITAL_COMMUNITY): Payer: BLUE CROSS/BLUE SHIELD

## 2017-06-19 NOTE — Progress Notes (Signed)
error 

## 2017-06-20 ENCOUNTER — Ambulatory Visit (HOSPITAL_COMMUNITY)
Admission: RE | Admit: 2017-06-20 | Discharge: 2017-06-20 | Disposition: A | Discharge status: Home / Self Care | Payer: BLUE CROSS/BLUE SHIELD | Source: Ambulatory Visit | Attending: Hematology and Oncology | Admitting: Hematology and Oncology

## 2017-06-20 ENCOUNTER — Inpatient Hospital Stay (HOSPITAL_BASED_OUTPATIENT_CLINIC_OR_DEPARTMENT_OTHER): Payer: BLUE CROSS/BLUE SHIELD | Admitting: Hematology and Oncology

## 2017-06-20 ENCOUNTER — Inpatient Hospital Stay: Payer: BLUE CROSS/BLUE SHIELD

## 2017-06-20 VITALS — BP 153/87 | HR 80 | Temp 98.4°F | Resp 19 | Ht 62.0 in | Wt 167.9 lb

## 2017-06-20 DIAGNOSIS — Z170 Estrogen receptor positive status [ER+]: Secondary | ICD-10-CM | POA: Insufficient documentation

## 2017-06-20 DIAGNOSIS — C50611 Malignant neoplasm of axillary tail of right female breast: Secondary | ICD-10-CM

## 2017-06-20 DIAGNOSIS — I071 Rheumatic tricuspid insufficiency: Secondary | ICD-10-CM | POA: Insufficient documentation

## 2017-06-20 DIAGNOSIS — E119 Type 2 diabetes mellitus without complications: Secondary | ICD-10-CM | POA: Diagnosis not present

## 2017-06-20 DIAGNOSIS — C50911 Malignant neoplasm of unspecified site of right female breast: Secondary | ICD-10-CM

## 2017-06-20 DIAGNOSIS — C773 Secondary and unspecified malignant neoplasm of axilla and upper limb lymph nodes: Secondary | ICD-10-CM | POA: Diagnosis not present

## 2017-06-20 LAB — ECHOCARDIOGRAM COMPLETE
Height: 62 in
Weight: 2686.4 oz

## 2017-06-20 MED ORDER — LORAZEPAM 0.5 MG PO TABS: 0.5000 mg | ORAL_TABLET | Freq: Four times a day (QID) | ORAL | 3 refills | Status: DC | PRN

## 2017-06-20 MED ORDER — LIDOCAINE-PRILOCAINE 2.5-2.5 % EX CREA: TOPICAL_CREAM | CUTANEOUS | 3 refills | Status: DC

## 2017-06-20 MED ORDER — ONDANSETRON HCL 8 MG PO TABS: 8.0000 mg | ORAL_TABLET | Freq: Two times a day (BID) | ORAL | 1 refills | Status: DC | PRN

## 2017-06-20 MED ORDER — PROCHLORPERAZINE MALEATE 10 MG PO TABS
10.0000 mg | ORAL_TABLET | Freq: Four times a day (QID) | ORAL | 1 refills | Status: DC | PRN
Start: 2017-06-20 — End: 2017-09-16

## 2017-06-20 MED ORDER — DEXAMETHASONE 2 MG PO TABS: ORAL_TABLET | ORAL | 0 refills | Status: DC

## 2017-06-20 NOTE — Progress Notes (Signed)
START ON PATHWAY REGIMEN - Breast   Dose-Dense AC q14 days:   A cycle is every 14 days:     Doxorubicin      Cyclophosphamide      [id number]   **Always confirm dose/schedule in your pharmacy ordering system**    Paclitaxel 80 mg/m2 Weekly:   Administer weekly:     Paclitaxel   **Always confirm dose/schedule in your pharmacy ordering system**    Patient Characteristics: Postoperative without Neoadjuvant Therapy (Pathologic Staging), Invasive Disease, Adjuvant Therapy, HER2 Negative/Unknown/Equivocal, ER Positive, Node Positive, Node Positive (4+) Therapeutic Status: Postoperative without Neoadjuvant Therapy (Pathologic Staging) AJCC Grade: G1 AJCC N Category: pN2a AJCC M Category: cM0 ER Status: Positive (+) AJCC 8 Stage Grouping: IB HER2 Status: Negative (-) Oncotype Dx Recurrence Score: Not Appropriate AJCC T Category: pT1c PR Status: Positive (+) Intent of Therapy: Curative Intent, Discussed with Patient

## 2017-06-20 NOTE — Assessment & Plan Note (Signed)
06/05/2017:Bilateral mastectomies: Right mastectomy: IDC grade 1, 3 foci 1.2 cm, 1.2 cm, 1 cm, IgA DCIS, LVH diffuse present, margins negative, 4/7 lymph nodes positive with extracapsular extension, ER 95%, PR 95%, HER-2 negative, Ki-67 2% to 10%, T1 cN2 aM0 stage II a pathological stage; left mastectomy: Benign  Mammaprint done preoperatively: Low risk  Recommendation: 1. Axillary lymph node dissection 2. Given her age and her high risk features of the breast cancer, recommended adjuvant systemic chemotherapy.  We will plan to give dose dense Adriamycin and Cytoxan x4 followed by Taxol weekly x12 3. Followed by radiation.  4. Followed by adjuvant antiestrogen therapy  --------------------------------------------------------------------------- UPBEAT clinical trial ([id number]): Newly diagnosed stage I to III breast cancer patients receiving either adjuvant or neoadjuvant chemotherapy undergo cardiac MRI before treatment and at 24 months along with neurocognitive testing, exercise and disability measures at baseline 3, 12 and 24 months.  Return to clinic in 3 weeks to start chemotherapy

## 2017-06-20 NOTE — Progress Notes (Signed)
Patient Care Team: Tisovec, Fransico Him, MD as PCP - General (Internal Medicine)  DIAGNOSIS:  Encounter Diagnosis  Name Primary?  . Malignant neoplasm of axillary tail of right breast in female, estrogen receptor positive ([place])     SUMMARY OF ONCOLOGIC HISTORY:   Malignant neoplasm of axillary tail of right breast in female, estrogen receptor positive ([place])   05/09/2017 Initial Diagnosis    May 2018: Palpable right axillary mass.  The recommended a 6-week follow-up but apparently the mass decreased in size.  Recent increase in the mass was noted, biopsy revealed Grade 1 IDC with DCIS, lymphovascular invasion present, 4 small hypoechoic masses largest 0.9 cm, T1BN1 stage Ib clinical stage      06/05/2017 Surgery    Bilateral mastectomies: Right mastectomy: IDC grade 1, 3 foci 1.2 cm, 1.2 cm, 1 cm, IgA DCIS, LVH diffuse present, margins negative, 4/7 lymph nodes positive with extracapsular extension, ER 95%, PR 95%, HER-2 negative, Ki-67 2% to 10%, T1 cN2 aM0 stage II a pathological stage; left mastectomy: Benign       CHIEF COMPLIANT: Follow-up to discuss adjuvant treatment plan  INTERVAL HISTORY: Stephanie Holden is a 42 year old with above-mentioned history of right-sided breast cancer underwent bilateral mastectomies and is here today to discuss along with her husband adjuvant treatment plan.  She had 4+ lymph nodes and we recommended that she undergo axillary lymph node dissection as well as adjuvant chemotherapy.  This is in spite of low risk Mammaprint testing.  She and her husband are here to ask more questions about the treatment plan.  There were under the impression initially that when she had low risk Mammaprint that chemo is out of question.  However because the Mammaprint test was not validated in 4+ lymph nodes, we are recommending systemic chemotherapy.  Because she has 4 involved lymph nodes in the axilla the standard of care is also axillary lymph node dissection.  She is here  accompanied by her husband to ask more questions about this plan.  REVIEW OF SYSTEMS:   Constitutional: Denies fevers, chills or abnormal weight loss Eyes: Denies blurriness of vision Ears, nose, mouth, throat, and face: Denies mucositis or sore throat Respiratory: Denies cough, dyspnea or wheezes Cardiovascular: Denies palpitation, chest discomfort Gastrointestinal:  Denies nausea, heartburn or change in bowel habits Skin: Denies abnormal skin rashes Lymphatics: Denies new lymphadenopathy or easy bruising Neurological:Denies numbness, tingling or new weaknesses Behavioral/Psych: Mood is stable, no new changes  Extremities: No lower extremity edema  All other systems were reviewed with the patient and are negative.  I have reviewed the past medical history, past surgical history, social history and family history with the patient and they are unchanged from previous note.  ALLERGIES:  is allergic to dilaudid [hydromorphone hcl].  MEDICATIONS:  Current Outpatient Medications  Medication Sig Dispense Refill  . acetaminophen (TYLENOL) 500 MG tablet Take 1,000 mg by mouth every 6 (six) hours as needed for moderate pain or headache.    . gabapentin (NEURONTIN) 300 MG capsule Take 1 capsule (300 mg total) by mouth 2 (two) times daily. (Patient taking differently: Take 300 mg by mouth at bedtime. ) 30 capsule 0  . insulin aspart (NOVOLOG) 100 UNIT/ML injection Inject 45 Units into the skin See admin instructions. Use via insulin pump up to 45 units daily    . methocarbamol (ROBAXIN) 500 MG tablet Take 1 tablet (500 mg total) by mouth every 6 (six) hours as needed for muscle spasms. 30 tablet 1  .  oxyCODONE (OXY IR/ROXICODONE) 5 MG immediate release tablet Take 1-2 tablets (5-10 mg total) by mouth every 4 (four) hours as needed for moderate pain. (Patient taking differently: Take 5 mg by mouth at bedtime. ) 30 tablet 0  . Probiotic Product (PROBIOTIC DAILY PO) Take 1 capsule by mouth at bedtime.      . tamoxifen (NOLVADEX) 20 MG tablet Take 1 tablet (20 mg total) by mouth daily. (Patient taking differently: Take 20 mg by mouth at bedtime. ) 30 tablet 1  . venlafaxine XR (EFFEXOR-XR) 37.5 MG 24 hr capsule Take 1 capsule (37.5 mg total) by mouth daily with breakfast. (Patient taking differently: Take 37.5 mg by mouth at bedtime. ) 30 capsule 3   No current facility-administered medications for this visit.     PHYSICAL EXAMINATION: ECOG PERFORMANCE STATUS: 1 - Symptomatic but completely ambulatory  Vitals:   06/20/17 1011  BP: (!) 153/87  Pulse: 80  Resp: 19  Temp: 98.4 F (36.9 C)  SpO2: 100%   Filed Weights   06/20/17 1011  Weight: 167 lb 14.4 oz (76.2 kg)    GENERAL:alert, no distress and comfortable SKIN: skin color, texture, turgor are normal, no rashes or significant lesions EYES: normal, Conjunctiva are pink and non-injected, sclera clear OROPHARYNX:no exudate, no erythema and lips, buccal mucosa, and tongue normal  NECK: supple, thyroid normal size, non-tender, without nodularity LYMPH:  no palpable lymphadenopathy in the cervical, axillary or inguinal LUNGS: clear to auscultation and percussion with normal breathing effort HEART: regular rate & rhythm and no murmurs and no lower extremity edema ABDOMEN:abdomen soft, non-tender and normal bowel sounds MUSCULOSKELETAL:no cyanosis of digits and no clubbing  NEURO: alert & oriented x 3 with fluent speech, no focal motor/sensory deficits EXTREMITIES: No lower extremity edema   LABORATORY DATA:  I have reviewed the data as listed CMP Latest Ref Rng & Units 06/06/2017 06/05/2017 05/14/2017  Glucose 65 - 99 mg/dL 255(H) 233(H) 187(H)  BUN 6 - 20 mg/dL '9 15 9  ' Creatinine 0.44 - 1.00 mg/dL 0.86 0.91 1.07  Sodium 135 - 145 mmol/L 129(L) 136 139  Potassium 3.5 - 5.1 mmol/L 4.3 4.5 4.0  Chloride 101 - 111 mmol/L 101 104 104  CO2 22 - 32 mmol/L 20(L) 20(L) 26  Calcium 8.9 - 10.3 mg/dL 7.6(L) 8.9 8.9  Total Protein 6.4 -  8.3 g/dL - - 7.0  Total Bilirubin 0.2 - 1.2 mg/dL - - 0.8  Alkaline Phos 40 - 150 U/L - - 81  AST 5 - 34 U/L - - 15  ALT 0 - 55 U/L - - 10    Lab Results  Component Value Date   WBC 12.7 (H) 06/06/2017   HGB 10.8 (L) 06/06/2017   HCT 31.0 (L) 06/06/2017   MCV 86.8 06/06/2017   PLT 248 06/06/2017   NEUTROABS 5.5 05/14/2017    ASSESSMENT & PLAN:  Malignant neoplasm of axillary tail of right breast in female, estrogen receptor positive ([place]) 06/05/2017:Bilateral mastectomies: Right mastectomy: IDC grade 1, 3 foci 1.2 cm, 1.2 cm, 1 cm, IgA DCIS, LVH diffuse present, margins negative, 4/7 lymph nodes positive with extracapsular extension, ER 95%, PR 95%, HER-2 negative, Ki-67 2% to 10%, T1 cN2 aM0 stage II a pathological stage; left mastectomy: Benign  Mammaprint done preoperatively: Low risk  Recommendation: 1. Axillary lymph node dissection 2. Given her age and her high risk features of the breast cancer, recommended adjuvant systemic chemotherapy.  We will plan to give dose dense Adriamycin and Cytoxan x4  followed by Taxol weekly x12 3. Followed by radiation.  4. Followed by adjuvant antiestrogen therapy  --------------------------------------------------------------------------- UPBEAT clinical trial ([id number]): Newly diagnosed stage I to III breast cancer patients receiving either adjuvant or neoadjuvant chemotherapy undergo cardiac MRI before treatment and at 24 months along with neurocognitive testing, exercise and disability measures at baseline 3, 12 and 24 months.  Return to clinic after recovering from axillary lymph node dissection to start chemotherapy  No orders of the defined types were placed in this encounter.  The patient has a good understanding of the overall plan. she agrees with it. she will call with any problems that may develop before the next visit here.   Harriette Ohara, MD 06/20/17

## 2017-06-20 NOTE — Progress Notes (Signed)
  Echocardiogram 2D Echocardiogram has been performed.  Stephanie Holden 06/20/2017, 11:54 AM

## 2017-06-23 NOTE — Pre-Procedure Instructions (Signed)
Stephanie Holden  06/23/2017      [id number] [place], [place] - [address] [place] [address] Phone: [phone] Fax: [phone]    Your procedure is scheduled on April 4  Report to [facility] Admitting at 0900 A.M.  Call this number if you have problems the morning of surgery:  [phone]   Remember:  Do not eat food or drink liquids after midnight.  Continue all medications as directed by your physician except follow these medication instructions before surgery below   Take these medicines the morning of surgery with A SIP OF WATER  acetaminophen (TYLENOL)  methocarbamol (ROBAXIN)   7 days prior to surgery STOP taking any Aspirin(unless otherwise instructed by your surgeon), Aleve, Naproxen, Ibuprofen, Motrin, Advil, Goody's, BC's, all herbal medications, fish oil, and all vitamins   WHAT DO I DO ABOUT MY DIABETES MEDICATION?  Patients with Insulin Pumps    . For patients with Insulin Pumps: o Contact your diabetes doctor for specific instructions before surgery. o Decrease basal insulin rates by 20% at midnight the night before surgery. o Note that if your surgery is planned to be longer than 2 hours, your insulin pump will be removed and intravenous (IV) insulin will be started and managed by the nurses and anesthesiologist. You will be able to restart your insulin pump once you are awake and able to manage it. o Make sure to bring insulin pump supplies to the hospital with you in case your site needs to be changed.   How to Manage Your Diabetes Before and After Surgery  Why is it important to control my blood sugar before and after surgery? . Improving blood sugar levels before and after surgery helps healing and can limit problems. . A way of improving blood sugar control is eating a healthy diet by: o  Eating less sugar and carbohydrates o  Increasing activity/exercise o  Talking  with your doctor about reaching your blood sugar goals . High blood sugars (greater than 180 mg/dL) can raise your risk of infections and slow your recovery, so you will need to focus on controlling your diabetes during the weeks before surgery. . Make sure that the doctor who takes care of your diabetes knows about your planned surgery including the date and location.  How do I manage my blood sugar before surgery? . Check your blood sugar at least 4 times a day, starting 2 days before surgery, to make sure that the level is not too high or low. o Check your blood sugar the morning of your surgery when you wake up and every 2 hours until you get to the Short Stay unit. . If your blood sugar is less than 70 mg/dL, you will need to treat for low blood sugar: o Do not take insulin. o Treat a low blood sugar (less than 70 mg/dL) with  cup of clear juice (cranberry or apple), 4 glucose tablets, OR glucose gel. o Recheck blood sugar in 15 minutes after treatment (to make sure it is greater than 70 mg/dL). If your blood sugar is not greater than 70 mg/dL on recheck, call [phone] for further instructions. . Report your blood sugar to the short stay nurse when you get to Short Stay.  . If you are admitted to the hospital after surgery: o Your blood sugar will be checked by the staff and you will probably be given insulin after  surgery (instead of oral diabetes medicines) to make sure you have good blood sugar levels. o The goal for blood sugar control after surgery is 80-180 mg/dL.    Do not wear jewelry, make-up or nail polish.  Do not wear lotions, powders, or perfumes, or deodorant.  Do not shave 48 hours prior to surgery.  Men may shave face and neck.  Do not bring valuables to the hospital.  [facility] is not responsible for any belongings or valuables.  Contacts, dentures or bridgework may not be worn into surgery.  Leave your suitcase in the car.  After surgery it may be brought to your  room.  For patients admitted to the hospital, discharge time will be determined by your treatment team.  Patients discharged the day of surgery will not be allowed to drive home.    Special instructions:   [place]- Preparing For Surgery  Before surgery, you can play an important role. Because skin is not sterile, your skin needs to be as free of germs as possible. You can reduce the number of germs on your skin by washing with CHG (chlorahexidine gluconate) Soap before surgery.  CHG is an antiseptic cleaner which kills germs and bonds with the skin to continue killing germs even after washing.  Please do not use if you have an allergy to CHG or antibacterial soaps. If your skin becomes reddened/irritated stop using the CHG.  Do not shave (including legs and underarms) for at least 48 hours prior to first CHG shower. It is OK to shave your face.  Please follow these instructions carefully.   1. Shower the NIGHT BEFORE SURGERY and the MORNING OF SURGERY with CHG.   2. If you chose to wash your hair, wash your hair first as usual with your normal shampoo.  3. After you shampoo, rinse your hair and body thoroughly to remove the shampoo.  4. Use CHG as you would any other liquid soap. You can apply CHG directly to the skin and wash gently with a scrungie or a clean washcloth.   5. Apply the CHG Soap to your body ONLY FROM THE NECK DOWN.  Do not use on open wounds or open sores. Avoid contact with your eyes, ears, mouth and genitals (private parts). Wash Face and genitals (private parts)  with your normal soap.  6. Wash thoroughly, paying special attention to the area where your surgery will be performed.  7. Thoroughly rinse your body with warm water from the neck down.  8. DO NOT shower/wash with your normal soap after using and rinsing off the CHG Soap.  9. Pat yourself dry with a CLEAN TOWEL.  10. Wear CLEAN PAJAMAS to bed the night before surgery, wear comfortable clothes the  morning of surgery  11. Place CLEAN SHEETS on your bed the night of your first shower and DO NOT SLEEP WITH PETS.    Day of Surgery: Do not apply any deodorants/lotions. Please wear clean clothes to the hospital/surgery center.      Please read over the following fact sheets that you were given.

## 2017-06-24 ENCOUNTER — Encounter (HOSPITAL_COMMUNITY): Payer: Self-pay

## 2017-06-24 ENCOUNTER — Other Ambulatory Visit: Payer: Self-pay

## 2017-06-24 ENCOUNTER — Encounter (HOSPITAL_COMMUNITY)
Admission: RE | Admit: 2017-06-24 | Discharge: 2017-06-24 | Disposition: A | Discharge status: Home / Self Care | Payer: BLUE CROSS/BLUE SHIELD | Source: Ambulatory Visit | Attending: General Surgery | Admitting: General Surgery

## 2017-06-24 DIAGNOSIS — C50411 Malignant neoplasm of upper-outer quadrant of right female breast: Secondary | ICD-10-CM | POA: Diagnosis not present

## 2017-06-24 DIAGNOSIS — Z794 Long term (current) use of insulin: Secondary | ICD-10-CM | POA: Diagnosis not present

## 2017-06-24 DIAGNOSIS — C773 Secondary and unspecified malignant neoplasm of axilla and upper limb lymph nodes: Secondary | ICD-10-CM | POA: Diagnosis not present

## 2017-06-24 DIAGNOSIS — Z87891 Personal history of nicotine dependence: Secondary | ICD-10-CM | POA: Diagnosis not present

## 2017-06-24 DIAGNOSIS — E109 Type 1 diabetes mellitus without complications: Secondary | ICD-10-CM | POA: Diagnosis not present

## 2017-06-24 DIAGNOSIS — I071 Rheumatic tricuspid insufficiency: Secondary | ICD-10-CM | POA: Diagnosis not present

## 2017-06-24 HISTORY — DX: Other specified postprocedural states: Z98.890

## 2017-06-24 HISTORY — DX: Nausea with vomiting, unspecified: R11.2

## 2017-06-24 LAB — BASIC METABOLIC PANEL
ANION GAP: 8 (ref 5–15)
BUN: 8 mg/dL (ref 6–20)
CHLORIDE: 104 mmol/L (ref 101–111)
CO2: 24 mmol/L (ref 22–32)
Calcium: 8.2 mg/dL — ABNORMAL LOW (ref 8.9–10.3)
Creatinine, Ser: 0.71 mg/dL (ref 0.44–1.00)
GFR calc non Af Amer: >60 mL/min (ref >60)
Glucose, Bld: 157 mg/dL — ABNORMAL HIGH (ref 65–99)
Potassium: 4 mmol/L (ref 3.5–5.1)
Sodium: 136 mmol/L (ref 135–145)

## 2017-06-24 LAB — CBC
HCT: 34 % — ABNORMAL LOW (ref 36.0–46.0)
Hemoglobin: 10.6 g/dL — ABNORMAL LOW (ref 12.0–15.0)
MCH: 27.5 pg (ref 26.0–34.0)
MCHC: 31.2 g/dL (ref 30.0–36.0)
MCV: 88.1 fL (ref 78.0–100.0)
PLATELETS: 370 10*3/uL (ref 150–400)
RBC: 3.86 MIL/uL — ABNORMAL LOW (ref 3.87–5.11)
RDW: 13.8 % (ref 11.5–15.5)
WBC: 10.3 10*3/uL (ref 4.0–10.5)

## 2017-06-24 LAB — GLUCOSE, CAPILLARY: Glucose-Capillary: 124 mg/dL — ABNORMAL HIGH (ref 65–99)

## 2017-06-24 LAB — HEMOGLOBIN A1C
Hgb A1c MFr Bld: 6.9 % — ABNORMAL HIGH (ref 4.8–5.6)
MEAN PLASMA GLUCOSE: 151.33 mg/dL

## 2017-06-24 NOTE — Progress Notes (Signed)
Notified Diabetes Coordinator Cecille Rubin) of patient's upcoming surgery 06/26/17 @ 1100.

## 2017-06-25 ENCOUNTER — Other Ambulatory Visit: Payer: Self-pay | Admitting: Hematology and Oncology

## 2017-06-25 DIAGNOSIS — C50919 Malignant neoplasm of unspecified site of unspecified female breast: Secondary | ICD-10-CM

## 2017-06-26 ENCOUNTER — Encounter (HOSPITAL_COMMUNITY)
Admission: RE | Disposition: A | Discharge status: Home / Self Care | Payer: Self-pay | Source: Ambulatory Visit | Attending: General Surgery

## 2017-06-26 ENCOUNTER — Encounter (HOSPITAL_COMMUNITY): Payer: Self-pay | Admitting: *Deleted

## 2017-06-26 ENCOUNTER — Telehealth: Payer: Self-pay | Admitting: Hematology and Oncology

## 2017-06-26 ENCOUNTER — Ambulatory Visit (HOSPITAL_COMMUNITY): Payer: BLUE CROSS/BLUE SHIELD

## 2017-06-26 ENCOUNTER — Ambulatory Visit (HOSPITAL_COMMUNITY)
Admission: RE | Admit: 2017-06-26 | Discharge: 2017-06-27 | Disposition: A | Discharge status: Home / Self Care | Payer: BLUE CROSS/BLUE SHIELD | Source: Ambulatory Visit | Attending: General Surgery | Admitting: General Surgery

## 2017-06-26 ENCOUNTER — Ambulatory Visit (HOSPITAL_COMMUNITY): Payer: BLUE CROSS/BLUE SHIELD | Admitting: Anesthesiology

## 2017-06-26 DIAGNOSIS — E109 Type 1 diabetes mellitus without complications: Secondary | ICD-10-CM | POA: Insufficient documentation

## 2017-06-26 DIAGNOSIS — I071 Rheumatic tricuspid insufficiency: Secondary | ICD-10-CM | POA: Insufficient documentation

## 2017-06-26 DIAGNOSIS — C50611 Malignant neoplasm of axillary tail of right female breast: Secondary | ICD-10-CM | POA: Diagnosis not present

## 2017-06-26 DIAGNOSIS — Z452 Encounter for adjustment and management of vascular access device: Secondary | ICD-10-CM | POA: Diagnosis not present

## 2017-06-26 DIAGNOSIS — Z87891 Personal history of nicotine dependence: Secondary | ICD-10-CM | POA: Diagnosis not present

## 2017-06-26 DIAGNOSIS — C50411 Malignant neoplasm of upper-outer quadrant of right female breast: Secondary | ICD-10-CM | POA: Diagnosis not present

## 2017-06-26 DIAGNOSIS — C50911 Malignant neoplasm of unspecified site of right female breast: Secondary | ICD-10-CM | POA: Diagnosis not present

## 2017-06-26 DIAGNOSIS — Z419 Encounter for procedure for purposes other than remedying health state, unspecified: Secondary | ICD-10-CM

## 2017-06-26 DIAGNOSIS — G8918 Other acute postprocedural pain: Secondary | ICD-10-CM | POA: Diagnosis not present

## 2017-06-26 DIAGNOSIS — C773 Secondary and unspecified malignant neoplasm of axilla and upper limb lymph nodes: Secondary | ICD-10-CM | POA: Diagnosis not present

## 2017-06-26 DIAGNOSIS — Z794 Long term (current) use of insulin: Secondary | ICD-10-CM | POA: Insufficient documentation

## 2017-06-26 HISTORY — PX: AXILLARY LYMPH NODE DISSECTION: SHX5229

## 2017-06-26 HISTORY — PX: PORTACATH PLACEMENT: SHX2246

## 2017-06-26 LAB — POCT PREGNANCY, URINE: Preg Test, Ur: NEGATIVE

## 2017-06-26 LAB — GLUCOSE, CAPILLARY
GLUCOSE-CAPILLARY: 235 mg/dL — AB (ref 65–99)
GLUCOSE-CAPILLARY: 147 mg/dL — AB (ref 65–99)
GLUCOSE-CAPILLARY: 161 mg/dL — AB (ref 65–99)
GLUCOSE-CAPILLARY: 183 mg/dL — AB (ref 65–99)
Glucose-Capillary: 297 mg/dL — ABNORMAL HIGH (ref 65–99)
Glucose-Capillary: 105 mg/dL — ABNORMAL HIGH (ref 65–99)
Glucose-Capillary: 194 mg/dL — ABNORMAL HIGH (ref 65–99)

## 2017-06-26 SURGERY — INSERTION, TUNNELED CENTRAL VENOUS DEVICE, WITH PORT
Anesthesia: General | Site: Chest | Laterality: Right

## 2017-06-26 MED ORDER — PROMETHAZINE HCL 25 MG/ML IJ SOLN: 12.5000 mg | Freq: Once | INTRAMUSCULAR | Status: DC | PRN

## 2017-06-26 MED ORDER — INSULIN PUMP
Freq: Every day | SUBCUTANEOUS | Status: DC
  Administered 2017-06-26 – 2017-06-27 (×3): via SUBCUTANEOUS
  Administered 2017-06-27: 5 via SUBCUTANEOUS
  Filled 2017-06-26: qty 1

## 2017-06-26 MED ORDER — DEXTROSE 10 % IV SOLN
INTRAVENOUS | Status: DC | PRN
  Administered 2017-06-26: 13:00:00 via INTRAVENOUS

## 2017-06-26 MED ORDER — MIDAZOLAM HCL 2 MG/2ML IJ SOLN: 2.0000 mg | Freq: Once | INTRAMUSCULAR | Status: DC

## 2017-06-26 MED ORDER — MIDAZOLAM HCL 2 MG/2ML IJ SOLN
INTRAMUSCULAR | Status: AC
  Administered 2017-06-26: 2 mg
  Filled 2017-06-26: qty 2

## 2017-06-26 MED ORDER — MIDAZOLAM HCL 5 MG/5ML IJ SOLN
INTRAMUSCULAR | Status: DC | PRN
  Administered 2017-06-26: 2 mg via INTRAVENOUS

## 2017-06-26 MED ORDER — METHOCARBAMOL 500 MG PO TABS: 500.0000 mg | ORAL_TABLET | Freq: Three times a day (TID) | ORAL | 1 refills | Status: DC | PRN

## 2017-06-26 MED ORDER — BUPIVACAINE-EPINEPHRINE (PF) 0.25% -1:200000 IJ SOLN
INTRAMUSCULAR | Status: DC | PRN
  Administered 2017-06-26: 12 mL via SUBCUTANEOUS

## 2017-06-26 MED ORDER — MIDAZOLAM HCL 2 MG/2ML IJ SOLN
INTRAMUSCULAR | Status: AC
  Filled 2017-06-26: qty 2

## 2017-06-26 MED ORDER — PROCHLORPERAZINE MALEATE 10 MG PO TABS
10.0000 mg | ORAL_TABLET | Freq: Four times a day (QID) | ORAL | Status: DC | PRN
  Filled 2017-06-26: qty 1

## 2017-06-26 MED ORDER — HEPARIN SOD (PORK) LOCK FLUSH 100 UNIT/ML IV SOLN
INTRAVENOUS | Status: DC | PRN
  Administered 2017-06-26: 500 [IU] via INTRAVENOUS

## 2017-06-26 MED ORDER — SUGAMMADEX SODIUM 200 MG/2ML IV SOLN
INTRAVENOUS | Status: DC | PRN
  Administered 2017-06-26: 200 mg via INTRAVENOUS

## 2017-06-26 MED ORDER — GABAPENTIN 300 MG PO CAPS
300.0000 mg | ORAL_CAPSULE | Freq: Every day | ORAL | Status: DC
  Administered 2017-06-26: 300 mg via ORAL
  Filled 2017-06-26: qty 1

## 2017-06-26 MED ORDER — ZOLPIDEM TARTRATE 5 MG PO TABS: 5.0000 mg | ORAL_TABLET | Freq: Every evening | ORAL | Status: DC | PRN

## 2017-06-26 MED ORDER — LIDOCAINE HCL (CARDIAC) 20 MG/ML IV SOLN
INTRAVENOUS | Status: AC
  Filled 2017-06-26: qty 15

## 2017-06-26 MED ORDER — HEPARIN SOD (PORK) LOCK FLUSH 100 UNIT/ML IV SOLN
INTRAVENOUS | Status: AC
  Filled 2017-06-26: qty 5

## 2017-06-26 MED ORDER — CHLORHEXIDINE GLUCONATE CLOTH 2 % EX PADS: 6.0000 | MEDICATED_PAD | Freq: Once | CUTANEOUS | Status: DC

## 2017-06-26 MED ORDER — PHENYLEPHRINE 40 MCG/ML (10ML) SYRINGE FOR IV PUSH (FOR BLOOD PRESSURE SUPPORT)
PREFILLED_SYRINGE | INTRAVENOUS | Status: AC
  Filled 2017-06-26: qty 20

## 2017-06-26 MED ORDER — 0.9 % SODIUM CHLORIDE (POUR BTL) OPTIME
TOPICAL | Status: DC | PRN
Start: 2017-06-26 — End: 2017-06-26
  Administered 2017-06-26: 1000 mL

## 2017-06-26 MED ORDER — ONDANSETRON HCL 4 MG/2ML IJ SOLN
4.0000 mg | Freq: Four times a day (QID) | INTRAMUSCULAR | Status: DC | PRN
  Administered 2017-06-26: 4 mg via INTRAVENOUS
  Filled 2017-06-26 (×2): qty 2

## 2017-06-26 MED ORDER — FENTANYL CITRATE (PF) 100 MCG/2ML IJ SOLN
INTRAMUSCULAR | Status: AC
  Administered 2017-06-26: 50 ug via INTRAVENOUS
  Filled 2017-06-26: qty 2

## 2017-06-26 MED ORDER — CELECOXIB 200 MG PO CAPS
200.0000 mg | ORAL_CAPSULE | Freq: Two times a day (BID) | ORAL | Status: DC
  Administered 2017-06-26 – 2017-06-27 (×2): 200 mg via ORAL
  Filled 2017-06-26 (×2): qty 1

## 2017-06-26 MED ORDER — METHOCARBAMOL 500 MG PO TABS
500.0000 mg | ORAL_TABLET | Freq: Four times a day (QID) | ORAL | Status: DC | PRN
  Administered 2017-06-26 – 2017-06-27 (×2): 500 mg via ORAL
  Filled 2017-06-26 (×2): qty 1

## 2017-06-26 MED ORDER — MORPHINE SULFATE (PF) 4 MG/ML IV SOLN
1.0000 mg | INTRAVENOUS | Status: DC | PRN
  Administered 2017-06-26 (×2): 2 mg via INTRAVENOUS
  Filled 2017-06-26 (×2): qty 1

## 2017-06-26 MED ORDER — DIPHENHYDRAMINE HCL 12.5 MG/5ML PO ELIX: 12.5000 mg | ORAL_SOLUTION | Freq: Four times a day (QID) | ORAL | Status: DC | PRN

## 2017-06-26 MED ORDER — SCOPOLAMINE 1 MG/3DAYS TD PT72
MEDICATED_PATCH | TRANSDERMAL | Status: DC | PRN
  Administered 2017-06-26: 1 via TRANSDERMAL

## 2017-06-26 MED ORDER — GABAPENTIN 300 MG PO CAPS
300.0000 mg | ORAL_CAPSULE | ORAL | Status: AC
  Administered 2017-06-26: 300 mg via ORAL
  Filled 2017-06-26: qty 1

## 2017-06-26 MED ORDER — LIDOCAINE HCL (PF) 1 % IJ SOLN
INTRAMUSCULAR | Status: AC
  Filled 2017-06-26: qty 30

## 2017-06-26 MED ORDER — PROPOFOL 10 MG/ML IV BOLUS
INTRAVENOUS | Status: DC | PRN
  Administered 2017-06-26: 150 mg via INTRAVENOUS
  Administered 2017-06-26: 50 mg via INTRAVENOUS

## 2017-06-26 MED ORDER — VENLAFAXINE HCL ER 37.5 MG PO CP24
37.5000 mg | ORAL_CAPSULE | Freq: Every day | ORAL | Status: DC
  Administered 2017-06-26: 37.5 mg via ORAL
  Filled 2017-06-26: qty 1

## 2017-06-26 MED ORDER — BUPIVACAINE-EPINEPHRINE (PF) 0.25% -1:200000 IJ SOLN
INTRAMUSCULAR | Status: AC
  Filled 2017-06-26: qty 60

## 2017-06-26 MED ORDER — FENTANYL CITRATE (PF) 250 MCG/5ML IJ SOLN
INTRAMUSCULAR | Status: DC | PRN
  Administered 2017-06-26 (×2): 100 ug via INTRAVENOUS
  Administered 2017-06-26: 50 ug via INTRAVENOUS

## 2017-06-26 MED ORDER — GLYCOPYRROLATE 0.2 MG/ML IJ SOLN
INTRAMUSCULAR | Status: DC | PRN
  Administered 2017-06-26: 0.2 mg via INTRAVENOUS

## 2017-06-26 MED ORDER — PROPOFOL 10 MG/ML IV BOLUS
INTRAVENOUS | Status: AC
  Filled 2017-06-26: qty 20

## 2017-06-26 MED ORDER — GABAPENTIN 300 MG PO CAPS: 300.0000 mg | ORAL_CAPSULE | Freq: Every day | ORAL | 0 refills | Status: DC

## 2017-06-26 MED ORDER — HEPARIN SODIUM (PORCINE) 5000 UNIT/ML IJ SOLN
INTRAMUSCULAR | Status: AC
  Filled 2017-06-26: qty 1.2

## 2017-06-26 MED ORDER — FENTANYL CITRATE (PF) 100 MCG/2ML IJ SOLN: 100.0000 ug | Freq: Once | INTRAMUSCULAR | Status: DC

## 2017-06-26 MED ORDER — LACTATED RINGERS IV SOLN
INTRAVENOUS | Status: DC
  Administered 2017-06-26 (×2): via INTRAVENOUS

## 2017-06-26 MED ORDER — SODIUM CHLORIDE 0.9 % IV SOLN
INTRAVENOUS | Status: AC
  Filled 2017-06-26: qty 1.2

## 2017-06-26 MED ORDER — ONDANSETRON HCL 4 MG/2ML IJ SOLN
INTRAMUSCULAR | Status: DC | PRN
  Administered 2017-06-26: 4 mg via INTRAVENOUS

## 2017-06-26 MED ORDER — LIDOCAINE HCL 1 % IJ SOLN
INTRAMUSCULAR | Status: AC
  Filled 2017-06-26: qty 20

## 2017-06-26 MED ORDER — CEFAZOLIN SODIUM-DEXTROSE 2-4 GM/100ML-% IV SOLN
2.0000 g | Freq: Three times a day (TID) | INTRAVENOUS | Status: AC
  Administered 2017-06-26: 2 g via INTRAVENOUS
  Filled 2017-06-26: qty 100

## 2017-06-26 MED ORDER — ACETAMINOPHEN 500 MG PO TABS
1000.0000 mg | ORAL_TABLET | ORAL | Status: AC
  Administered 2017-06-26: 1000 mg via ORAL
  Filled 2017-06-26: qty 2

## 2017-06-26 MED ORDER — OXYCODONE HCL 5 MG PO TABS: 5.0000 mg | ORAL_TABLET | Freq: Four times a day (QID) | ORAL | 0 refills | Status: DC | PRN

## 2017-06-26 MED ORDER — SENNA 8.6 MG PO TABS
1.0000 | ORAL_TABLET | Freq: Two times a day (BID) | ORAL | Status: DC
  Administered 2017-06-27: 8.6 mg via ORAL
  Filled 2017-06-26 (×2): qty 1

## 2017-06-26 MED ORDER — INSULIN ASPART 100 UNIT/ML ~~LOC~~ SOLN: 45.0000 [IU] | SUBCUTANEOUS | Status: DC

## 2017-06-26 MED ORDER — TAMOXIFEN CITRATE 10 MG PO TABS
20.0000 mg | ORAL_TABLET | Freq: Every day | ORAL | Status: DC
  Administered 2017-06-26: 20 mg via ORAL
  Filled 2017-06-26: qty 2

## 2017-06-26 MED ORDER — FENTANYL CITRATE (PF) 250 MCG/5ML IJ SOLN
INTRAMUSCULAR | Status: AC
  Filled 2017-06-26: qty 5

## 2017-06-26 MED ORDER — ACETAMINOPHEN 500 MG PO TABS: 1000.0000 mg | ORAL_TABLET | Freq: Four times a day (QID) | ORAL | Status: DC | PRN

## 2017-06-26 MED ORDER — SODIUM CHLORIDE 0.9 % IV SOLN
INTRAVENOUS | Status: DC
  Administered 2017-06-26: 17:00:00 via INTRAVENOUS

## 2017-06-26 MED ORDER — TRAMADOL HCL 50 MG PO TABS: 50.0000 mg | ORAL_TABLET | Freq: Four times a day (QID) | ORAL | Status: DC | PRN

## 2017-06-26 MED ORDER — LIDOCAINE HCL (CARDIAC) 20 MG/ML IV SOLN
INTRAVENOUS | Status: DC | PRN
  Administered 2017-06-26: 40 mg via INTRATRACHEAL

## 2017-06-26 MED ORDER — DEXAMETHASONE SODIUM PHOSPHATE 10 MG/ML IJ SOLN
INTRAMUSCULAR | Status: DC | PRN
  Administered 2017-06-26: 5 mg via INTRAVENOUS

## 2017-06-26 MED ORDER — ONDANSETRON HCL 4 MG/2ML IJ SOLN
INTRAMUSCULAR | Status: AC
  Filled 2017-06-26: qty 2

## 2017-06-26 MED ORDER — CEFAZOLIN SODIUM-DEXTROSE 2-4 GM/100ML-% IV SOLN
2.0000 g | INTRAVENOUS | Status: AC
  Administered 2017-06-26: 2 g via INTRAVENOUS
  Filled 2017-06-26: qty 100

## 2017-06-26 MED ORDER — DEXAMETHASONE SODIUM PHOSPHATE 10 MG/ML IJ SOLN
INTRAMUSCULAR | Status: AC
  Filled 2017-06-26: qty 2

## 2017-06-26 MED ORDER — DIPHENHYDRAMINE HCL 50 MG/ML IJ SOLN: 12.5000 mg | Freq: Four times a day (QID) | INTRAMUSCULAR | Status: DC | PRN

## 2017-06-26 MED ORDER — ROPIVACAINE HCL 7.5 MG/ML IJ SOLN
INTRAMUSCULAR | Status: DC | PRN
  Administered 2017-06-26 (×6): 5 mL via PERINEURAL

## 2017-06-26 MED ORDER — ONDANSETRON HCL 4 MG/2ML IJ SOLN
INTRAMUSCULAR | Status: AC
  Filled 2017-06-26: qty 4

## 2017-06-26 MED ORDER — PHENYLEPHRINE 40 MCG/ML (10ML) SYRINGE FOR IV PUSH (FOR BLOOD PRESSURE SUPPORT)
PREFILLED_SYRINGE | INTRAVENOUS | Status: DC | PRN
  Administered 2017-06-26 (×3): 80 ug via INTRAVENOUS

## 2017-06-26 MED ORDER — OXYCODONE HCL 5 MG PO TABS
5.0000 mg | ORAL_TABLET | ORAL | Status: DC | PRN
  Administered 2017-06-27 (×2): 10 mg via ORAL
  Filled 2017-06-26 (×3): qty 2

## 2017-06-26 MED ORDER — SUGAMMADEX SODIUM 200 MG/2ML IV SOLN
INTRAVENOUS | Status: AC
Start: 2017-06-26 — End: 2017-06-26
  Filled 2017-06-26: qty 2

## 2017-06-26 MED ORDER — CELECOXIB 200 MG PO CAPS
200.0000 mg | ORAL_CAPSULE | ORAL | Status: AC
  Administered 2017-06-26: 200 mg via ORAL
  Filled 2017-06-26: qty 1

## 2017-06-26 MED ORDER — FENTANYL CITRATE (PF) 100 MCG/2ML IJ SOLN
25.0000 ug | INTRAMUSCULAR | Status: DC | PRN
  Administered 2017-06-26 (×2): 50 ug via INTRAVENOUS

## 2017-06-26 MED ORDER — MEPERIDINE HCL 50 MG/ML IJ SOLN: 6.2500 mg | INTRAMUSCULAR | Status: DC | PRN

## 2017-06-26 MED ORDER — BUPIVACAINE-EPINEPHRINE (PF) 0.25% -1:200000 IJ SOLN
INTRAMUSCULAR | Status: AC
  Filled 2017-06-26: qty 30

## 2017-06-26 MED ORDER — SODIUM CHLORIDE 0.9 % IV SOLN
INTRAVENOUS | Status: DC | PRN
  Administered 2017-06-26: 13:00:00

## 2017-06-26 MED ORDER — FENTANYL CITRATE (PF) 100 MCG/2ML IJ SOLN
INTRAMUSCULAR | Status: AC
  Administered 2017-06-26: 100 ug
  Filled 2017-06-26: qty 2

## 2017-06-26 MED ORDER — HEPARIN SODIUM (PORCINE) 1000 UNIT/ML IJ SOLN
INTRAMUSCULAR | Status: AC
  Filled 2017-06-26: qty 1

## 2017-06-26 MED ORDER — ROCURONIUM BROMIDE 100 MG/10ML IV SOLN
INTRAVENOUS | Status: DC | PRN
  Administered 2017-06-26: 40 mg via INTRAVENOUS

## 2017-06-26 MED ORDER — LORAZEPAM 0.5 MG PO TABS
0.5000 mg | ORAL_TABLET | Freq: Four times a day (QID) | ORAL | Status: DC | PRN
Start: 2017-06-26 — End: 2017-06-27

## 2017-06-26 MED ORDER — DIPHENHYDRAMINE HCL 50 MG/ML IJ SOLN
INTRAMUSCULAR | Status: AC
  Filled 2017-06-26: qty 1

## 2017-06-26 MED ORDER — KETOROLAC TROMETHAMINE 30 MG/ML IJ SOLN: 30.0000 mg | Freq: Once | INTRAMUSCULAR | Status: DC | PRN

## 2017-06-26 SURGICAL SUPPLY — 52 items
BAG DECANTER FOR FLEXI CONT (MISCELLANEOUS) ×3 IMPLANT
BINDER BREAST LRG (GAUZE/BANDAGES/DRESSINGS) ×3 IMPLANT
BIOPATCH RED 1 DISK 7.0 (GAUZE/BANDAGES/DRESSINGS) ×3 IMPLANT
BLADE SURG 11 STRL SS (BLADE) ×3 IMPLANT
BLADE SURG 15 STRL LF DISP TIS (BLADE) ×2 IMPLANT
BLADE SURG 15 STRL SS (BLADE) ×1
CHLORAPREP W/TINT 10.5 ML (MISCELLANEOUS) ×3 IMPLANT
CHLORAPREP W/TINT 26ML (MISCELLANEOUS) ×3 IMPLANT
CLIP VESOCCLUDE MED 24/CT (CLIP) ×3 IMPLANT
CLIP VESOCCLUDE MED 6/CT (CLIP) ×3 IMPLANT
CONT SPEC 4OZ CLIKSEAL STRL BL (MISCELLANEOUS) ×3 IMPLANT
COVER SURGICAL LIGHT HANDLE (MISCELLANEOUS) ×3 IMPLANT
CRADLE DONUT ADULT HEAD (MISCELLANEOUS) ×3 IMPLANT
DECANTER SPIKE VIAL GLASS SM (MISCELLANEOUS) ×3 IMPLANT
DERMABOND ADVANCED (GAUZE/BANDAGES/DRESSINGS) ×1
DERMABOND ADVANCED .7 DNX12 (GAUZE/BANDAGES/DRESSINGS) ×2 IMPLANT
DRAIN CHANNEL 19F RND (DRAIN) ×3 IMPLANT
DRAPE C-ARM 42X72 X-RAY (DRAPES) ×3 IMPLANT
DRAPE CHEST BREAST 15X10 FENES (DRAPES) ×3 IMPLANT
DRAPE UTILITY XL STRL (DRAPES) ×3 IMPLANT
DRSG TEGADERM 4X4.75 (GAUZE/BANDAGES/DRESSINGS) ×6 IMPLANT
ELECT CAUTERY BLADE 6.4 (BLADE) ×3 IMPLANT
ELECT REM PT RETURN 9FT ADLT (ELECTROSURGICAL) ×3
ELECTRODE REM PT RTRN 9FT ADLT (ELECTROSURGICAL) ×2 IMPLANT
EVACUATOR SILICONE 100CC (DRAIN) ×3 IMPLANT
GAUZE SPONGE 4X4 16PLY XRAY LF (GAUZE/BANDAGES/DRESSINGS) ×3 IMPLANT
GLOVE BIO SURGEON STRL SZ 6 (GLOVE) ×6 IMPLANT
GLOVE INDICATOR 6.5 STRL GRN (GLOVE) ×6 IMPLANT
GOWN STRL REUS W/ TWL LRG LVL3 (GOWN DISPOSABLE) ×4 IMPLANT
GOWN STRL REUS W/TWL 2XL LVL3 (GOWN DISPOSABLE) ×6 IMPLANT
GOWN STRL REUS W/TWL LRG LVL3 (GOWN DISPOSABLE) ×2
KIT BASIN OR (CUSTOM PROCEDURE TRAY) ×3 IMPLANT
KIT PORT POWER 8FR ISP CVUE (Port) ×3 IMPLANT
KIT TURNOVER KIT B (KITS) ×3 IMPLANT
LIGHT WAVEGUIDE WIDE FLAT (MISCELLANEOUS) ×3 IMPLANT
NEEDLE HYPO 25GX1X1/2 BEV (NEEDLE) ×3 IMPLANT
NS IRRIG 1000ML POUR BTL (IV SOLUTION) ×3 IMPLANT
PACK SURGICAL SETUP 50X90 (CUSTOM PROCEDURE TRAY) ×3 IMPLANT
PAD ABD 8X10 STRL (GAUZE/BANDAGES/DRESSINGS) ×6 IMPLANT
PENCIL BUTTON HOLSTER BLD 10FT (ELECTRODE) ×3 IMPLANT
STRIP CLOSURE SKIN 1/2X4 (GAUZE/BANDAGES/DRESSINGS) ×6 IMPLANT
SUT ETHILON 2 0 FS 18 (SUTURE) ×3 IMPLANT
SUT MNCRL AB 4-0 PS2 18 (SUTURE) ×3 IMPLANT
SUT MON AB 4-0 PC3 18 (SUTURE) ×3 IMPLANT
SUT PROLENE 2 0 SH DA (SUTURE) ×6 IMPLANT
SUT SILK 2 0 PERMA HAND 18 BK (SUTURE) ×3 IMPLANT
SUT VIC AB 3-0 SH 18 (SUTURE) ×3 IMPLANT
SUT VIC AB 3-0 SH 27 (SUTURE) ×1
SUT VIC AB 3-0 SH 27X BRD (SUTURE) ×2 IMPLANT
SYR 5ML LUER SLIP (SYRINGE) ×3 IMPLANT
SYR CONTROL 10ML LL (SYRINGE) ×3 IMPLANT
TOWEL OR 17X26 10 PK STRL BLUE (TOWEL DISPOSABLE) ×3 IMPLANT

## 2017-06-26 NOTE — Anesthesia Procedure Notes (Signed)
Procedure Name: Intubation Date/Time: 06/26/2017 12:04 PM Performed by: Mariea Clonts, CRNA Pre-anesthesia Checklist: Patient identified, Emergency Drugs available, Suction available and Patient being monitored Patient Re-evaluated:Patient Re-evaluated prior to induction Oxygen Delivery Method: Circle System Utilized Preoxygenation: Pre-oxygenation with 100% oxygen Induction Type: IV induction and Cricoid Pressure applied Ventilation: Oral airway inserted - appropriate to patient size Laryngoscope Size: Sabra Heck and 2 Grade View: Grade II Tube type: Oral Number of attempts: 2 Airway Equipment and Method: Oral airway and Bougie stylet Placement Confirmation: ETT inserted through vocal cords under direct vision,  positive ETCO2 and breath sounds checked- equal and bilateral Tube secured with: Tape Dental Injury: Teeth and Oropharynx as per pre-operative assessment

## 2017-06-26 NOTE — Discharge Instructions (Signed)
Surgical Drain Home Care °Surgical drains are used to remove extra fluid that normally builds up in a surgical wound after surgery. A surgical drain helps to heal a surgical wound. Different kinds of surgical drains include: °· Active drains. These drains use suction to pull drainage away from the surgical wound. Drainage flows through a tube to a container outside of the body. It is important to keep the bulb or the drainage container flat (compressed) at all times, except while you empty it. Flattening the bulb or container creates suction. The two most common types of active drains are bulb drains and Hemovac drains. °· Passive drains. These drains allow fluid to drain naturally, by gravity. Drainage flows through a tube to a bandage (dressing) or a container outside of the body. Passive drains do not need to be emptied. The most common type of passive drain is the Penrose drain. ° °A drain is placed during surgery. Immediately after surgery, drainage is usually bright red and a little thicker than water. The drainage may gradually turn yellow or pink and become thinner. It is likely that your health care provider will remove the drain when the drainage stops or when the amount decreases to 1-2 Tbsp (15-30 mL) during a 24-hour period. °How to care for your surgical drain °· Keep the skin around the drain dry and covered with a dressing at all times. °· Check your drain area every day for signs of infection. Check for: °? More redness, swelling, or pain. °? Pus or a bad smell. °? Cloudy drainage. °Follow instructions from your health care provider about how to take care of your drain and how to change your dressing. Change your dressing at least one time every day. Change it more often if needed to keep the dressing dry. Make sure you: °1. Gather your supplies, including: °? Tape. °? Germ-free cleaning solution (sterile saline). °? Split gauze drain sponge: 4 x 4 inches (10 x 10 cm). °? Gauze square: 4 x 4 inches  (10 x 10 cm). °2. Wash your hands with soap and water before you change your dressing. If soap and water are not available, use hand sanitizer. °3. Remove the old dressing. Avoid using scissors to do that. °4. Use sterile saline to clean your skin around the drain. °5. Place the tube through the slit in a drain sponge. Place the drain sponge so that it covers your wound. °6. Place the gauze square or another drain sponge on top of the drain sponge that is on the wound. Make sure the tube is between those layers. °7. Tape the dressing to your skin. °8. If you have an active bulb or Hemovac drain, tape the drainage tube to your skin 1-2 inches (2.5-5 cm) below the place where the tube enters your body. Taping keeps the tube from pulling on any stitches (sutures) that you have. °9. Wash your hands with soap and water. °10. Write down the color of your drainage and how often you change your dressing. ° °How to empty your active bulb or Hemovac drain °1. Make sure that you have a measuring cup that you can empty your drainage into. °2. Wash your hands with soap and water. If soap and water are not available, use hand sanitizer. °3. Gently move your fingers down the tube while squeezing very lightly. This is called stripping the tube. This clears any drainage, clots, or tissue from the tube. °? Do not pull on the tube. °? You may need to strip   the tube several times every day to keep the tube clear. °4. Open the bulb cap or the drain plug. Do not touch the inside of the cap or the bottom of the plug. °5. Empty all of the drainage into the measuring cup. °6. Compress the bulb or the container and replace the cap or the plug. To compress the bulb or the container, squeeze it firmly in the middle while you close the cap or plug the container. °7. Write down the amount of drainage that you have in each 24-hour period. If you have less than 2 Tbsp (30 mL) of drainage during 24 hours, contact your health care  provider. °8. Flush the drainage down the toilet. °9. Wash your hands with soap and water. °Contact a health care provider if: °· You have more redness, swelling, or pain around your drain area. °· The amount of drainage that you have is increasing instead of decreasing. °· You have pus or a bad smell coming from your drain area. °· You have a fever. °· You have drainage that is cloudy. °· There is a sudden stop or a sudden decrease in the amount of drainage that you have. °· Your tube falls out. °· Your active drain does not stay compressed after you empty it. °This information is not intended to replace advice given to you by your health care provider. Make sure you discuss any questions you have with your health care provider. °Document Released: 03/08/2000 Document Revised: 08/17/2015 Document Reviewed: 09/28/2014 °Elsevier Interactive Patient Education © 2018 [employer] ° °

## 2017-06-26 NOTE — Op Note (Signed)
PREOPERATIVE DIAGNOSIS:  Right breast cancer, [id number]     POSTOPERATIVE DIAGNOSIS:  Same     PROCEDURE: Left subclavian port placement, Bard ClearVue Power Port, MRI safe, 8-French, right axillary lymph node dissection.       SURGEON:  [name], MD   ASSISTANT: Ky Barban, RNFA     ANESTHESIA:  General   FINDINGS:  Good venous return, easy flush, and tip of the catheter and   SVC 18.5 cm.      SPECIMEN:  None.      ESTIMATED BLOOD LOSS:  Minimal.      COMPLICATIONS:  None known.      PROCEDURE:  Pt was identified in the holding area and taken to   the operating room, where patient was placed supine on the operating room   table.  General anesthesia was induced.  Patient's left arm tucked and the upper   chest and neck and right arm were prepped and draped in sterile fashion.  Time-out was   performed according to the surgical safety check list.  When all was   correct, we continued.   Local anesthetic was administered over this   area at the angle of the clavicle.  The vein was accessed with 1 pass(es) of the needle. There was good venous return and the wire passed easily with no ectopy.   Fluoroscopy was used to confirm that the wire was in the vena cava.      The patient was placed back level and the area for the pocket was anethetized   with local anesthetic.  A 3-cm transverse incision was made with a #15   blade.  Cautery was used to divide the subcutaneous tissues down to the   pectoralis muscle.  An [employer] retractor was used to elevate the skin   while a pocket was created on top of the pectoralis fascia.  The port   was placed into the pocket to confirm that it was of adequate size.  The   catheter was preattached to the port.  The port was then secured to the   pectoralis fascia with four 2-0 Prolene sutures.  These were clamped and   not tied down yet.    The catheter was tunneled through to the wire exit  site.  The catheter was placed along the wire to  determine what length it should be to be in the SVC.  The catheter was cut at 18.5 cm.  The tunneler sheath and dilator were passed over the wire and the dilator and wire were removed.  The catheter was advanced through the tunneler sheath and the tunneler sheath was pulled away.  Care was taken to keep the catheter in the tunneler sheath as this occurred. This was advanced and the tunneler sheath was removed.  There was good venous   return and easy flush of the catheter.  The Prolene sutures were tied   down to the pectoral fascia.  The skin was reapproximated using 3-0   Vicryl interrupted deep dermal sutures.    Fluoroscopy was used to re-confirm good position of the catheter.  The skin   was then closed using 4-0 Monocryl in a subcuticular fashion.  The port was flushed with concentrated heparin flush as well.    The lateral portion of the right mastectomy incision was opened. An axillary dissection was performed with removal of the associated lymph nodes and surrounding adipose tissue. This included levels I and II. This was accomplished by exposing  the axillary vein anteriorly and inferiorly to the level of the pectoralis minor and laterally over the latissimus dorsi muscle. Posteriorly, the dissection continued to the subscapularis.  Small venous tributaries, lymphatics, and vessels were clipped and ligated or cauterized and divided. The subscapularis muscle was skeletonized. The long thoracic and thoracodorsal neurovascular bundles were identified and preserved.  A 19 Fr blake drain was placed in the axilla and secured with a 2-0 nylon.  The wound was irrigated and closed with a 3-0 Vicryl deep dermal interrupted and a 4-0 vicryl subcuticular closure in layers.  The wounds were then cleaned, dried, and dressed with Dermabond.  The patient was awakened from anesthesia and taken to the PACU in stable condition.  Needle, sponge, and instrument counts were correct.               [name], MD

## 2017-06-26 NOTE — Telephone Encounter (Signed)
Left message for patient regarding upcoming April through June appointments.

## 2017-06-26 NOTE — Anesthesia Procedure Notes (Signed)
Procedure Name: LMA Insertion Date/Time: 06/26/2017 11:51 AM Performed by: Mariea Clonts, CRNA Pre-anesthesia Checklist: Patient identified, Emergency Drugs available, Suction available and Patient being monitored Patient Re-evaluated:Patient Re-evaluated prior to induction Oxygen Delivery Method: Circle System Utilized Preoxygenation: Pre-oxygenation with 100% oxygen Induction Type: IV induction Ventilation: Mask ventilation without difficulty LMA: LMA inserted LMA Size: 4.0 Number of attempts: 1 Airway Equipment and Method: Bite block Placement Confirmation: positive ETCO2 Tube secured with: Tape Dental Injury: Teeth and Oropharynx as per pre-operative assessment

## 2017-06-26 NOTE — Progress Notes (Signed)
1625 Received patient from PACU. Alert and oriented x4. Dressings clean, dry, and intact. Patient complaining of pain and nausea. Administered Morphine and Zofran. Patient accompanied by spouse. Will continue to monitor.

## 2017-06-26 NOTE — Interval H&P Note (Signed)
History and Physical Interval Note:  06/26/2017 11:25 AM  Stephanie Holden  has presented today for surgery, with the diagnosis of RIGHT BREAST CANCER.  She had numerous positive lymph nodes with sentinel lymph node biopsy and will get chemotherapy.   The various methods of treatment have been discussed with the patient and family. After consideration of risks, benefits and other options for treatment, the patient has consented to  Procedure(s): INSERTION PORT-A-CATH (N/A) AXILLARY LYMPH NODE DISSECTION (Right) as a surgical intervention .  The patient's history has been reviewed, patient examined, no change in status, stable for surgery.  I have reviewed the patient's chart and labs.  Questions were answered to the patient's satisfaction.     [name]

## 2017-06-26 NOTE — Anesthesia Procedure Notes (Signed)
Anesthesia Regional Block: Pectoralis block   Pre-Anesthetic Checklist: ,, timeout performed, Correct Patient, Correct Site, Correct Laterality, Correct Procedure, Correct Position, site marked, Risks and benefits discussed,  Surgical consent,  Pre-op evaluation,  At surgeon's request and post-op pain management  Laterality: Right  Prep: chloraprep       Needles:  Injection technique: Single-shot  Needle Type: Echogenic Stimulator Needle     Needle Length: 9cm  Needle Gauge: 21   Needle insertion depth: 3.3 cm   Additional Needles:   Narrative:  Start time: 06/26/2017 10:40 AM End time: 06/26/2017 11:04 AM Injection made incrementally with aspirations every 5 mL.  Performed by: Personally  Anesthesiologist: Lyn Hollingshead, MD

## 2017-06-26 NOTE — Transfer of Care (Signed)
Immediate Anesthesia Transfer of Care Note  Patient: Stephanie Holden  Procedure(s) Performed: INSERTION PORT-A-CATH (Left Chest) AXILLARY LYMPH NODE DISSECTION (Right Axilla)  Patient Location: PACU  Anesthesia Type:GA combined with regional for post-op pain  Level of Consciousness: awake, alert  and oriented  Airway & Oxygen Therapy: Patient Spontanous Breathing and Patient connected to nasal cannula oxygen  Post-op Assessment: Report given to RN and Post -op Vital signs reviewed and stable  Post vital signs: Reviewed and stable  Last Vitals:  Vitals Value Taken Time  BP    Temp    Pulse 72 06/26/2017  2:17 PM  Resp 11 06/26/2017  2:17 PM  SpO2 99 % 06/26/2017  2:17 PM  Vitals shown include unvalidated device data.  Last Pain: There were no vitals filed for this visit.    Patients Stated Pain Goal: 3 (68/08/81 1031)  Complications: No apparent anesthesia complications

## 2017-06-26 NOTE — Anesthesia Preprocedure Evaluation (Signed)
Anesthesia Evaluation    History of Anesthesia Complications (+) PONV  Airway Mallampati: I       Dental no notable dental hx. (+) Teeth Intact   Pulmonary former smoker,    Pulmonary exam normal breath sounds clear to auscultation       Cardiovascular negative cardio ROS Normal cardiovascular exam Rhythm:Regular Rate:Normal     Neuro/Psych PSYCHIATRIC DISORDERS Depression    GI/Hepatic   Endo/Other  diabetes, Type 1, Insulin Dependent  Renal/GU   negative genitourinary   Musculoskeletal   Abdominal Normal abdominal exam  (+)   Peds negative pediatric ROS (+)  Hematology   Anesthesia Other Findings Stephanie Holden  ECHO COMPLETE WO IMAGING ENHANCING AGENT  Order# [id number]  Reading physician: Sueanne Margarita, MD Ordering physician: Nicholas Lose, MD Study date: 06/20/17 Study Result   Result status: Final result                             *[facility] [employer]                        [place], [place] [address]                            [phone]  ------------------------------------------------------------------- Transthoracic Echocardiography  Patient:    Stephanie Holden, Stephanie Holden MR #:       [id number] Study Date: 06/20/2017 Gender:     F Age:        41 Height:     157.5 cm Weight:     76.2 kg BSA:        1.85 m^2 Pt. Status: Room:   PERFORMING   [facility], Outpatient  ATTENDING    Nicholas Lose [id number]  [id number]     TIRWER, [place], [place]  SONOGRAPHER  Jannett Celestine, RDCS  cc:  ------------------------------------------------------------------- LV EF: 55% -   60%  ------------------------------------------------------------------- History:   PMH:  malignant neoplasm breast female unspec. 174.9. Risk factors:  Diabetes  mellitus.  ------------------------------------------------------------------- Study Conclusions  - Left ventricle: Global LV longitudinal strain is normal at -18.6%   The cavity size was normal. Systolic function was normal. The   estimated ejection fraction was in the range of 55% to 60%. Wall   motion was normal; there were no regional wall motion   abnormalities. Left ventricular diastolic function parameters   were normal. - Mitral valve: There was trivial regurgitation. - Atrial septum: The septum bowed from left to right, consistent   with increased left atrial pressure.  ------------------------------------------------------------------- Study data:  No prior study was available for comparison.  Study status:  Routine.  Procedure:  The patient reported no pain pre or post test. Transthoracic echocardiography. Image quality was adequate.  Study completion:  There were no complications. Transthoracic echocardiography.  M-mode, complete 2D, spectral Doppler, and color Doppler.  Birthdate:  Patient birthdate: 07-28-1975.  Age:  Patient is 42 yr old.  Sex:  Gender: female. BMI: 30.7 kg/m^2.  Blood pressure:     153/87  Patient status: Inpatient.  Study date:  Study date:  06/20/2017. Study time: 11:17 AM.  Location:  Echo laboratory.  -------------------------------------------------------------------  ------------------------------------------------------------------- Left ventricle:  Global LV longitudinal strain is normal at -18.6% The cavity size was normal. Systolic function was normal. The estimated ejection fraction was in the range of 55% to 60%. Wall motion was normal; there were no regional wall motion abnormalities. The transmitral flow pattern was normal. The deceleration time of the early transmitral flow velocity was normal. The pulmonary vein flow pattern was normal. The tissue Doppler parameters were normal. Left ventricular diastolic function parameters  were normal.  ------------------------------------------------------------------- Aortic valve:   Trileaflet; normal thickness leaflets. Mobility was not restricted.  Doppler:  Transvalvular velocity was within the normal range. There was no stenosis. There was no regurgitation.   ------------------------------------------------------------------- Aorta:  Aortic root: The aortic root was normal in size.  ------------------------------------------------------------------- Mitral valve:   Structurally normal valve.   Mobility was not restricted.  Doppler:  Transvalvular velocity was within the normal range. There was no evidence for stenosis. There was trivial regurgitation.    Peak gradient (D): 4 mm Hg.  ------------------------------------------------------------------- Left atrium:  The atrium was normal in size.  ------------------------------------------------------------------- Atrial septum:  The septum bowed from left to right, consistent with increased left atrial pressure.  ------------------------------------------------------------------- Right ventricle:  The cavity size was normal. Wall thickness was normal. Systolic function was normal.  ------------------------------------------------------------------- Pulmonic valve:    Structurally normal valve.   Cusp separation was normal.  Doppler:  Transvalvular velocity was within the normal range. There was no evidence for stenosis. There was no regurgitation.  ------------------------------------------------------------------- Tricuspid valve:   Structurally normal valve.    Doppler: Transvalvular velocity was within the normal range. There was mild regurgitation.  ------------------------------------------------------------------- Pulmonary artery:   The main pulmonary artery was normal-sized. Systolic pressure was within the normal  range.  ------------------------------------------------------------------- Right atrium:  The atrium was normal in size.  ------------------------------------------------------------------- Pericardium:  There was no pericardial effusion.  ------------------------------------------------------------------- Systemic veins: Inferior vena cava: The vessel was normal in size.  ------------------------------------------------------------------- Measurements   Left ventricle                         Value        Reference  LV ID, ED, PLAX chordal                46    mm     43 - 52  LV ID, ES, PLAX chordal                33    mm     23 - 38  LV    Reproductive/Obstetrics                             Anesthesia Physical Anesthesia Plan  ASA: II  Anesthesia Plan: General   Post-op Pain Management:  Regional for Post-op pain   Induction:   PONV Risk Score and Plan: 4 or greater and Ondansetron, Dexamethasone, Midazolam and Scopolamine patch - Pre-op  Airway Management Planned: LMA  Additional Equipment:   Intra-op Plan:   Post-operative Plan:   Informed Consent: I have reviewed the patients History and Physical, chart, labs and discussed the procedure including the risks, benefits and alternatives for the proposed anesthesia with the patient or authorized representative who has indicated his/her understanding and acceptance.   Dental advisory given  Plan Discussed with: CRNA and Surgeon  Anesthesia Plan  Comments:         Anesthesia Quick Evaluation

## 2017-06-27 ENCOUNTER — Telehealth: Payer: Self-pay | Admitting: Hematology and Oncology

## 2017-06-27 ENCOUNTER — Encounter (HOSPITAL_COMMUNITY): Payer: Self-pay | Admitting: General Surgery

## 2017-06-27 ENCOUNTER — Ambulatory Visit: Payer: BLUE CROSS/BLUE SHIELD

## 2017-06-27 ENCOUNTER — Ambulatory Visit
Admit: 2017-06-27 | Discharge: 2017-06-27 | Disposition: A | Discharge status: Home / Self Care | Payer: BLUE CROSS/BLUE SHIELD | Attending: Radiation Oncology | Admitting: Radiation Oncology

## 2017-06-27 DIAGNOSIS — Z794 Long term (current) use of insulin: Secondary | ICD-10-CM | POA: Diagnosis not present

## 2017-06-27 DIAGNOSIS — I071 Rheumatic tricuspid insufficiency: Secondary | ICD-10-CM | POA: Diagnosis not present

## 2017-06-27 DIAGNOSIS — E109 Type 1 diabetes mellitus without complications: Secondary | ICD-10-CM | POA: Diagnosis not present

## 2017-06-27 DIAGNOSIS — Z87891 Personal history of nicotine dependence: Secondary | ICD-10-CM | POA: Diagnosis not present

## 2017-06-27 DIAGNOSIS — C50411 Malignant neoplasm of upper-outer quadrant of right female breast: Secondary | ICD-10-CM | POA: Diagnosis not present

## 2017-06-27 DIAGNOSIS — C773 Secondary and unspecified malignant neoplasm of axilla and upper limb lymph nodes: Secondary | ICD-10-CM | POA: Diagnosis not present

## 2017-06-27 LAB — CBC
HCT: 34 % — ABNORMAL LOW (ref 36.0–46.0)
HEMOGLOBIN: 10.6 g/dL — AB (ref 12.0–15.0)
MCH: 27.1 pg (ref 26.0–34.0)
MCHC: 31.2 g/dL (ref 30.0–36.0)
MCV: 87 fL (ref 78.0–100.0)
Platelets: 377 10*3/uL (ref 150–400)
RBC: 3.91 MIL/uL (ref 3.87–5.11)
RDW: 13.8 % (ref 11.5–15.5)
WBC: 13.7 10*3/uL — ABNORMAL HIGH (ref 4.0–10.5)

## 2017-06-27 LAB — BASIC METABOLIC PANEL
ANION GAP: 7 (ref 5–15)
BUN: 6 mg/dL (ref 6–20)
CHLORIDE: 105 mmol/L (ref 101–111)
CO2: 23 mmol/L (ref 22–32)
Calcium: 8.5 mg/dL — ABNORMAL LOW (ref 8.9–10.3)
Creatinine, Ser: 0.73 mg/dL (ref 0.44–1.00)
GFR calc Af Amer: >60 mL/min (ref >60)
GFR calc non Af Amer: >60 mL/min (ref >60)
Glucose, Bld: 151 mg/dL — ABNORMAL HIGH (ref 65–99)
POTASSIUM: 4 mmol/L (ref 3.5–5.1)
SODIUM: 135 mmol/L (ref 135–145)

## 2017-06-27 NOTE — Telephone Encounter (Signed)
Left message for patient regarding upcoming April and may appointments per 4/3 sch message

## 2017-06-27 NOTE — Progress Notes (Signed)
Pt discharged home in stable condition after going over discharge instructions with no concerns voiced. AVS given before leaving unit 

## 2017-07-01 NOTE — Progress Notes (Signed)
Please let patient know that we got path back and only 2 more nodes were positive with 20 being negative.  Does not change further plan of chemo and radiation.

## 2017-07-02 NOTE — Anesthesia Postprocedure Evaluation (Signed)
Anesthesia Post Note  Patient: Stephanie Holden  Procedure(s) Performed: INSERTION PORT-A-CATH (Left Chest) AXILLARY LYMPH NODE DISSECTION (Right Axilla)     Patient location during evaluation: PACU Anesthesia Type: General Level of consciousness: awake Pain management: pain level controlled Vital Signs Assessment: post-procedure vital signs reviewed and stable Respiratory status: spontaneous breathing Cardiovascular status: stable Postop Assessment: adequate PO intake Anesthetic complications: no    Last Vitals:  Vitals:   06/27/17 0519 06/27/17 0949  BP: 134/69 120/71  Pulse: 71 73  Resp: 16 17  Temp: 36.9 C 36.9 C  SpO2: 99% 98%    Last Pain:  Vitals:   06/27/17 0949  TempSrc: Oral  PainSc:    Pain Goal: Patients Stated Pain Goal: 3 (06/26/17 0928)               Dalena Plantz JR,JOHN Mateo Flow

## 2017-07-03 ENCOUNTER — Other Ambulatory Visit: Payer: Self-pay

## 2017-07-03 ENCOUNTER — Ambulatory Visit: Payer: BLUE CROSS/BLUE SHIELD | Attending: General Surgery | Admitting: Physical Therapy

## 2017-07-03 ENCOUNTER — Encounter: Payer: Self-pay | Admitting: Physical Therapy

## 2017-07-03 DIAGNOSIS — M79622 Pain in left upper arm: Secondary | ICD-10-CM | POA: Diagnosis not present

## 2017-07-03 DIAGNOSIS — M25612 Stiffness of left shoulder, not elsewhere classified: Secondary | ICD-10-CM | POA: Diagnosis not present

## 2017-07-03 DIAGNOSIS — R293 Abnormal posture: Secondary | ICD-10-CM

## 2017-07-03 DIAGNOSIS — C50611 Malignant neoplasm of axillary tail of right female breast: Secondary | ICD-10-CM | POA: Insufficient documentation

## 2017-07-03 DIAGNOSIS — M79621 Pain in right upper arm: Secondary | ICD-10-CM | POA: Insufficient documentation

## 2017-07-03 DIAGNOSIS — M25611 Stiffness of right shoulder, not elsewhere classified: Secondary | ICD-10-CM | POA: Diagnosis not present

## 2017-07-03 DIAGNOSIS — Z483 Aftercare following surgery for neoplasm: Secondary | ICD-10-CM | POA: Diagnosis not present

## 2017-07-03 DIAGNOSIS — Z170 Estrogen receptor positive status [ER+]: Secondary | ICD-10-CM | POA: Diagnosis not present

## 2017-07-03 NOTE — Patient Instructions (Addendum)
Scapular Retraction (Standing)    With arms at sides, pinch shoulder blades together. Repeat _5___ times per set. Do __1__ sets per session. Do __3__ sessions per day.  http://orth.exer.us/945   Copyright  VHI. All rights reserved.  Shoulder Roll    Move shoulders backward so you are rolling them in a small gentle circle BACKWARDS only. Continue circling shoulders backward __10_ times.  Do _3__ times per day.  Copyright  VHI. All rights reserved.     Stand with your back against the wall trying to have your head, shoulders, and buttocks all touching the wall. Stand there for 30 seconds and then relax. Repeat 3 times, twice a day.Diaphragmatic Breathing - Standing    Hands on top of navel, lips closed, breathe in through nose filling stomach with air, navel moves out toward hands. Exhale through puckered lips, hands follow exhalation in. Repeat _3-4__ times. Rest _10__ seconds between repeats. Do _3-4__ times per day.  Copyright  VHI. All rights reserved.

## 2017-07-03 NOTE — Therapy (Addendum)
[place], [place], [address] Phone: [phone]   Fax:  [phone]  Physical Therapy Evaluation  Patient Details  Name: Stephanie Holden MRN: [id number] Date of Birth: 11/19/1975 Referring Provider: Dr. [name]   Encounter Date: 07/03/2017  PT End of Session - 07/03/17 0859    Visit Number  2    Number of Visits  12    Date for PT Re-Evaluation  08/14/17    PT Start Time  0814    PT Stop Time  0900    PT Time Calculation (min)  46 min    Activity Tolerance  Patient tolerated treatment well    Behavior During Therapy  [facility] for tasks assessed/performed       Past Medical History:  Diagnosis Date  . Cancer [facility])    Breast cancer - right  . Constipation   . Depression   . Genetic testing 05/27/2017   Breast/GYN panel (23 genes) @ [facility] - No pathogenic mutations detected  . GERD (gastroesophageal reflux disease)   . Insulin dependent type 1 diabetes mellitus ([place])   . Insulin pump in place   . PONV (postoperative nausea and vomiting)   . Stenosing tenosynovitis of thumb 06/2014   right  . Thyroid goiter     Past Surgical History:  Procedure Laterality Date  . AXILLARY LYMPH NODE DISSECTION Right 06/26/2017   Procedure: AXILLARY LYMPH NODE DISSECTION;  Surgeon: [name], MD;  Location: [place];  Service: General;  Laterality: Right;  . CARPAL TUNNEL RELEASE Bilateral 10/28/2013   Procedure: BILATERAL CARPAL TUNNEL RELEASE;  Surgeon: Daryll Brod, MD;  Location: [place];  Service: Orthopedics;  Laterality: Bilateral;  . CESAREAN SECTION  2000; 08/20/2001  . ESSURE TUBAL LIGATION  2005   failed, tubal puncture  . LAPAROSCOPIC TUBAL LIGATION  04/02/2007   removal Ess[place]  . LAPAROSCOPIC UNILATERAL SALPINGECTOMY  2005  . MASTECTOMY W/ SENTINEL NODE BIOPSY Bilateral 06/05/2017   Procedure: BILATERAL MASTECTOMIES  WITH RIGHT SENTINEL LYMPH NODE BIOPSY;  Surgeon: [name], MD;   Location: [place];  Service: General;  Laterality: Bilateral;  . PORTACATH PLACEMENT Left 06/26/2017   Procedure: INSERTION PORT-A-CATH;  Surgeon: [name], MD;  Location: [place];  Service: General;  Laterality: Left;  . TRIGGER FINGER RELEASE Left 04/12/2014   Procedure: RELEASE A-1 PULLEY LEFT THUMB;  Surgeon: Daryll Brod, MD;  Location: [place];  Service: Orthopedics;  Laterality: Left;  . TRIGGER FINGER RELEASE Right 07/19/2014   Procedure: RELEASE TRIGGER FINGER/A-1 PULLEY RIGHT THUMB;  Surgeon: Daryll Brod, MD;  Location: [place];  Service: Orthopedics;  Laterality: Right;    There were no vitals filed for this visit.   Subjective Assessment - 07/03/17 0818    Subjective  Patient underwent a bilateral mastectomy with right targeted node dissection with 4/7 positive on 06/05/17 for right invasive ductal carcinoma. She then underwent a right axillary node dissection on 06/26/17 with 2/20 positive nodes. She also had a port-a-cath placed on 06/26/17. She currently has 3 drains in place and will see her surgeon on 07/17/17. The plan is chemotherapy, radiation, and anti-estrogen therapy.     Pertinent History  Patient was diagnosed on 05/08/17 with right invasive ductal carcinoma breast cancer. She was seen in 5/18 and found to have a right axillary mass but did not f/u for further testing. She then had a mammogram in 2/19 and was diagnosed with right breast cancer which  had metastasized to the axillary lymph nodes. She had multiple small masses in the lower outer quadrant of her right breast with the largest mass measuring 1.3 cm. It is ER/PR positive and HER2 negative with a Ki67 of 2%. Patient underwent a bilateral mastectomy with right targeted node dissection with 4/7 positive on 06/05/17 for right invasive ductal carcinoma. She then underwent a right axillary node dissection on 06/26/17 with 2/20 positive nodes. She also had a port-a-cath placed on 06/26/17. She currently has  3 drains in place and will see her surgeon on 07/17/17. The plan is chemotherapy, radiation, and anti-estrogen therapy.     Patient Stated Goals  Try to get arm moving and reduce lymphedema risk    Currently in Pain?  Yes    Pain Score  7     Pain Location  Axilla    Pain Orientation  Right    Pain Descriptors / Indicators  Dull;Constant    Pain Type  Surgical pain    Pain Onset  In the past 7 days    Pain Frequency  Intermittent    Aggravating Factors   Moving arm    Pain Relieving Factors  Rest, tylenol    Multiple Pain Sites  No         [facility] PT Assessment - 07/03/17 0001      Assessment   Medical Diagnosis  s/p bil mastectomy and rt ALND    Referring Provider  Dr. [name]    Onset Date/Surgical Date  06/26/17    Hand Dominance  Right    Prior Therapy  Baseline assessment      Precautions   Precautions  Other (comment)    Precaution Comments  Type I diabetic; recent surgery      Restrictions   Weight Bearing Restrictions  No      Balance Screen   Has the patient fallen in the past 6 months  No    Has the patient had a decrease in activity level because of a fear of falling?   No    Is the patient reluctant to leave their home because of a fear of falling?   No      Home Environment   Living Environment  Private residence    Living Arrangements  Spouse/significant other;Children Husband, 39 and 71 y.o.    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      Sensation   Additional Comments  No sign of neuropathy  at baseline      Posture/Postural Control   Posture/Postural Control  Postural limitations    Postural Limitations  Rounded Shoulders;Forward head      ROM / Strength   AROM / PROM / Strength  AROM      AROM   AROM Assessment Site  Shoulder    Right/Left Shoulder  Right;Left    Right Shoulder Extension  28 Degrees    Right Shoulder Flexion  81 Degrees  Right Shoulder ABduction  65 Degrees    Right Shoulder Internal Rotation  -- Not tested due to limited abduction    Right Shoulder External Rotation  -- Not tested due to limited abduction    Left Shoulder Extension  40 Degrees    Left Shoulder Flexion  90 Degrees Limited by precautions    Left Shoulder ABduction  85 Degrees    Left Shoulder Internal Rotation  54 Degrees    Left Shoulder External Rotation  70 Degrees    Cervical Flexion  WNL    Cervical Extension  WNL    Cervical - Right Side Bend  WNL    Cervical - Left Side Bend  25% limited     Cervical - Right Rotation  WNL    Cervical - Left Rotation  WNL      Strength   Overall Strength  Unable to assess;Due to precautions      Palpation   Palpation comment  Numb with palpation to areas around incision sites.                Katina Dung - 07/03/17 0001    Open a tight or new jar  Mild difficulty    Do heavy household chores (wash walls, wash floors)  Unable    Carry a shopping bag or briefcase  Mild difficulty    Wash your back  Moderate difficulty    Use a knife to cut food  No difficulty    Recreational activities in which you take some force or impact through your arm, shoulder, or hand (golf, hammering, tennis)  Unable    During the past week, to what extent has your arm, shoulder or hand problem interfered with your normal social activities with family, friends, neighbors, or groups?  Quite a bit    During the past week, to what extent has your arm, shoulder or hand problem limited your work or other regular daily activities  Quite a bit    Arm,  shoulder, or hand pain.  Moderate    Tingling (pins and needles) in your arm, shoulder, or hand  Moderate    Difficulty Sleeping  Moderate difficulty    DASH Score  54.55 %        Objective measurements completed on examination: See above findings.              PT Education - 07/03/17 0858    Education provided  Yes    Education Details  Lymphedema risk reduction, HEP for gentle posture, breathing    Person(s) Educated  Patient    Methods  Explanation;Demonstration;Handout    Comprehension  Verbalized understanding;Returned demonstration          PT Long Term Goals - 07/03/17 0930      PT LONG TERM GOAL #1   Title  Patient will demonstrate she has returned to baseline related to shoulder ROM and function.    Time  6    Period  Weeks    Status  New      PT LONG TERM GOAL #2   Title  Patient will increase bilateral shoulder flexion AROM to >/= 140 degrees for increased ease reaching overhead.    Time  6    Period  Weeks    Status  New      PT LONG TERM GOAL #3   Title  Patient will increase bilateral shoulder abduction AROM to >/= 140 degrees for increased ease reaching overhead.    Time  6    Period  Weeks    Status  New      PT LONG TERM GOAL #4   Title  Patient will report she has been able to return to >/= 75% of normal daily tasks.    Time  6    Period  Weeks    Status  New      PT LONG TERM GOAL #5   Title  Patient is able to verbalize understanding of where and how to be fitted for a compression sleeve for flying and also risk reduction practices for lymphedema.    Time  6    Period  Weeks    Status  New      Breast Clinic Goals - 05/15/17 0803      Patient will be able to verbalize understanding of pertinent lymphedema risk reduction practices relevant to her diagnosis specifically related to skin care.   Time  1    Period  Days    Status  Achieved      Patient will be able to return demonstrate and/or verbalize understanding of the  post-op home exercise program related to regaining shoulder range of motion.   Time  1    Period  Days    Status  Achieved      Patient will be able to verbalize understanding of the importance of attending the postoperative After Breast Cancer Class for further lymphedema risk reduction education and therapeutic exercise.   Time  1    Period  Days    Status  Achieved            Plan - 07/03/17 0917    Clinical Impression Statement  Patient underwent a bilateral mastectomy with a right targeted node dissection on 06/05/17 and had 4 positive axillary lymph nodes. She underwent a right axillary node dissection on 06/26/17 where 2 of 20 nodes were positive. She still has 3 drains (2 on right and 1 on left). She has limited ROM due to precautions but also due to pain and scar tissue. She has edema present distal to her chest incisions. She will benefit from PT to improve ROM, strength and posture and reduce her risk for lymphedema as she is at very high risk. We will hold PT for the next 2 weeks until her drains are removed.    Rehab Potential  Excellent    Clinical Impairments Affecting Rehab Potential  Recent surgery with extensive axillary dissection    PT Frequency  2x / week    PT Duration  4 weeks Beginning week of 07/21/17    PT Treatment/Interventions  ADLs/Self Care Home Management;Therapeutic activities;Therapeutic exercise;Patient/family education;Manual techniques;Manual lymph drainage;Scar mobilization;Passive range of motion    PT Next Visit Plan  Will see pt in 2 weeks after drains are removed. Begin PROM, pulleys, postural exercises    PT Home Exercise Plan  Gentle breathing and shoulder/scapular rolls and retraction    Consulted and Agree with Plan of Care  Patient       Patient will benefit from skilled therapeutic intervention in order to improve the following deficits and impairments:  Pain, Decreased scar mobility, Decreased range of motion, Impaired UE functional use,  Postural dysfunction, Decreased knowledge of precautions, Increased fascial restricitons, Decreased activity tolerance, Decreased knowledge of use of DME, Increased edema  Visit Diagnosis: Malignant neoplasm of axillary tail of right breast in female, estrogen receptor positive ([place]) - Plan: PT plan of care cert/re-cert  Abnormal posture - Plan: PT  plan of care cert/re-cert  Aftercare following surgery for neoplasm - Plan: PT plan of care cert/re-cert  Stiffness of left shoulder, not elsewhere classified - Plan: PT plan of care cert/re-cert  Stiffness of right shoulder, not elsewhere classified - Plan: PT plan of care cert/re-cert  Pain in left upper arm - Plan: PT plan of care cert/re-cert  Pain in right upper arm - Plan: PT plan of care cert/re-cert     Problem List Patient Active Problem List   Diagnosis Date Noted  . Breast cancer metastasized to axillary lymph node, right ([place]) 06/05/2017  . Genetic testing 05/27/2017  . Malignant neoplasm of axillary tail of right breast in female, estrogen receptor positive ([place]) 05/09/2017  . Cubital tunnel syndrome on left 10/02/2015  . Trigger finger, left index finger 04/12/2015  . Erroneous encounter - disregard 09/22/2014  . Diabetes type 1, controlled ([place]) 01/12/2013    Annia Friendly, PT 07/03/17 9:37 AM  [place], [place], [address] Phone: [phone]   Fax:  [phone]  Name: CAYTLIN BETTER MRN: [id number] Date of Birth: 09-Nov-1975

## 2017-07-08 ENCOUNTER — Ambulatory Visit: Payer: BLUE CROSS/BLUE SHIELD | Admitting: Hematology and Oncology

## 2017-07-08 ENCOUNTER — Other Ambulatory Visit: Payer: BLUE CROSS/BLUE SHIELD

## 2017-07-08 ENCOUNTER — Other Ambulatory Visit: Payer: Self-pay | Admitting: Hematology and Oncology

## 2017-07-08 ENCOUNTER — Ambulatory Visit: Payer: BLUE CROSS/BLUE SHIELD

## 2017-07-09 ENCOUNTER — Inpatient Hospital Stay: Payer: BLUE CROSS/BLUE SHIELD | Admitting: *Deleted

## 2017-07-09 ENCOUNTER — Encounter: Payer: Self-pay | Admitting: Hematology and Oncology

## 2017-07-09 ENCOUNTER — Ambulatory Visit (HOSPITAL_COMMUNITY)
Admission: RE | Admit: 2017-07-09 | Discharge: 2017-07-09 | Disposition: A | Discharge status: Home / Self Care | Payer: BLUE CROSS/BLUE SHIELD | Source: Ambulatory Visit | Attending: Hematology and Oncology | Admitting: Hematology and Oncology

## 2017-07-09 ENCOUNTER — Inpatient Hospital Stay: Payer: BLUE CROSS/BLUE SHIELD | Attending: Hematology and Oncology

## 2017-07-09 ENCOUNTER — Encounter: Payer: Self-pay | Admitting: *Deleted

## 2017-07-09 VITALS — BP 140/87 | HR 88 | Ht 62.75 in | Wt 162.3 lb

## 2017-07-09 DIAGNOSIS — F418 Other specified anxiety disorders: Secondary | ICD-10-CM | POA: Diagnosis not present

## 2017-07-09 DIAGNOSIS — G4700 Insomnia, unspecified: Secondary | ICD-10-CM | POA: Diagnosis not present

## 2017-07-09 DIAGNOSIS — C50919 Malignant neoplasm of unspecified site of unspecified female breast: Secondary | ICD-10-CM

## 2017-07-09 DIAGNOSIS — C773 Secondary and unspecified malignant neoplasm of axilla and upper limb lymph nodes: Secondary | ICD-10-CM | POA: Diagnosis not present

## 2017-07-09 DIAGNOSIS — C50611 Malignant neoplasm of axillary tail of right female breast: Secondary | ICD-10-CM

## 2017-07-09 DIAGNOSIS — C50911 Malignant neoplasm of unspecified site of right female breast: Secondary | ICD-10-CM

## 2017-07-09 DIAGNOSIS — C50411 Malignant neoplasm of upper-outer quadrant of right female breast: Secondary | ICD-10-CM | POA: Diagnosis not present

## 2017-07-09 DIAGNOSIS — Z794 Long term (current) use of insulin: Secondary | ICD-10-CM | POA: Diagnosis not present

## 2017-07-09 DIAGNOSIS — E109 Type 1 diabetes mellitus without complications: Secondary | ICD-10-CM | POA: Diagnosis not present

## 2017-07-09 DIAGNOSIS — Z5111 Encounter for antineoplastic chemotherapy: Secondary | ICD-10-CM | POA: Diagnosis not present

## 2017-07-09 DIAGNOSIS — Z006 Encounter for examination for normal comparison and control in clinical research program: Secondary | ICD-10-CM

## 2017-07-09 DIAGNOSIS — Z170 Estrogen receptor positive status [ER+]: Secondary | ICD-10-CM

## 2017-07-09 DIAGNOSIS — E11319 Type 2 diabetes mellitus with unspecified diabetic retinopathy without macular edema: Secondary | ICD-10-CM | POA: Diagnosis not present

## 2017-07-09 LAB — CBC WITH DIFFERENTIAL (CANCER CENTER ONLY)
BASOS PCT: 1 %
Basophils Absolute: 0.1 10*3/uL (ref 0.0–0.1)
EOS PCT: 3 %
Eosinophils Absolute: 0.2 10*3/uL (ref 0.0–0.5)
HCT: 36.3 % (ref 34.8–46.6)
Hemoglobin: 11.8 g/dL (ref 11.6–15.9)
Lymphocytes Relative: 22 %
Lymphs Abs: 1.8 10*3/uL (ref 0.9–3.3)
MCH: 27.3 pg (ref 25.1–34.0)
MCHC: 32.6 g/dL (ref 31.5–36.0)
MCV: 83.9 fL (ref 79.5–101.0)
MONO ABS: 0.5 10*3/uL (ref 0.1–0.9)
Monocytes Relative: 6 %
Neutro Abs: 5.5 10*3/uL (ref 1.5–6.5)
Neutrophils Relative %: 68 %
PLATELETS: 349 10*3/uL (ref 145–400)
RBC: 4.33 MIL/uL (ref 3.70–5.45)
RDW: 14.3 % (ref 11.2–14.5)
WBC Count: 8.1 10*3/uL (ref 3.9–10.3)

## 2017-07-09 LAB — CMP (CANCER CENTER ONLY)
ALT: 9 U/L (ref 0–55)
ANION GAP: 4 (ref 3–11)
AST: 12 U/L (ref 5–34)
Albumin: 3 g/dL — ABNORMAL LOW (ref 3.5–5.0)
Alkaline Phosphatase: 68 U/L (ref 40–150)
BUN: 14 mg/dL (ref 7–26)
CALCIUM: 8.9 mg/dL (ref 8.4–10.4)
CO2: 27 mmol/L (ref 22–29)
Chloride: 108 mmol/L (ref 98–109)
Creatinine: 0.82 mg/dL (ref 0.60–1.10)
GLUCOSE: 126 mg/dL (ref 70–140)
POTASSIUM: 4 mmol/L (ref 3.5–5.1)
SODIUM: 139 mmol/L (ref 136–145)
Total Bilirubin: 0.3 mg/dL (ref 0.2–1.2)
Total Protein: 6.8 g/dL (ref 6.4–8.3)

## 2017-07-09 LAB — RESEARCH LABS

## 2017-07-09 NOTE — Progress Notes (Signed)
BASELINE ACTIVITIES FOR UPBEAT ([id number]) STUDY; Patient into clinic this morning by herself to complete baseline study activities for Upbeat.  Greeted patient in lobby and then met in private exam room to review consent/ hippa forms for her signature.  Patient previously signed consent on 06/23/17 but study requires consent within 7 days of enrollment and patient had not been enrolled/ registered to study yet. See consent note encounter for further details.  After consent, research nurse obtained patient's height and weight without shoes and without heavy clothing per protocol.  Blood pressure and heart rate also obtained per protocol after allowing patient to sit/rest for 5 minutes. BMI calculated using NIH BMI calculator website provided by study = 29.  Patient was given study questionnaires to complete while waiting for her lab appointment.  Patient then had her blood drawn for the study including chemistry, serum, plasma and DNA.   Research nurse then escorted patient to private conference room to complete neurocognitive testing and physical functions tests.  Neurocognitive testing was completed by research specialist, Remer Macho, who also collected patient's completed questionnaires.  Research nurse reviewed section F of the questionnaire booklet and her score for Depressive Symptom Screen was 9, which does not require follow up per protocol.  Concomitant medication list reviewed and updated with patient. She has not started the Decadron, Zofran, EMLA or Compazine yet. These were prescribed to start with chemotherapy which is scheduled to start on 07/22/17.  Patient then completed her physical functions testing which included 6-Minute Walk Test, Disability Measures, and SPPB with assistance of Merceda Elks, research nurse,  without difficulty.  Patient was escorted to MRI department for her cardiac MRI and instructed she is free to leave after the MRI completed.  MRI encounter form and guide given to MRI  staff at front desk.   Thanked patient for her time and participation today.  Informed patient she will receive a $25 gift card for today's study visit.  I can bring it back to MRI department after she is done or research nurse can give to her on her next appointment in our clinic which is scheduled 07/12/17.  Patient said that she can get the card on her next appointment.  Informed patient that next study procedure is blood draw one month after she starts chemotherapy and we will try to arrange this same day as a regular lab appointment if possible.  Thanked patient again for her participation and encouraged her to call research nurse if any questions prior to next visit.   Foye Spurling, BSN, RN Clinical Research Nurse 07/09/2017 11:00 AM

## 2017-07-09 NOTE — Progress Notes (Signed)
Called pt to introduce myself as her Arboriculturist and to discuss copay assistance.  Pt stated she has met her deductible and she's pretty close to meeting her OOP too so she doesn't think she will need copay assistance.  I also informed her of the [employer], went over what it covers and gave her the income requirement but she declined it at this time.  She has my contact info in case her situation changes and for any questions or concerns she may have in the future.

## 2017-07-14 ENCOUNTER — Other Ambulatory Visit: Payer: Self-pay | Admitting: *Deleted

## 2017-07-14 DIAGNOSIS — Z006 Encounter for examination for normal comparison and control in clinical research program: Secondary | ICD-10-CM

## 2017-07-15 ENCOUNTER — Ambulatory Visit: Payer: BLUE CROSS/BLUE SHIELD | Admitting: Adult Health

## 2017-07-15 ENCOUNTER — Other Ambulatory Visit: Payer: BLUE CROSS/BLUE SHIELD

## 2017-07-21 ENCOUNTER — Other Ambulatory Visit: Payer: Self-pay

## 2017-07-21 ENCOUNTER — Encounter: Payer: Self-pay | Admitting: Physical Therapy

## 2017-07-21 ENCOUNTER — Ambulatory Visit: Payer: BLUE CROSS/BLUE SHIELD | Admitting: Physical Therapy

## 2017-07-21 DIAGNOSIS — Z483 Aftercare following surgery for neoplasm: Secondary | ICD-10-CM

## 2017-07-21 DIAGNOSIS — C50611 Malignant neoplasm of axillary tail of right female breast: Secondary | ICD-10-CM | POA: Diagnosis not present

## 2017-07-21 DIAGNOSIS — M25612 Stiffness of left shoulder, not elsewhere classified: Secondary | ICD-10-CM | POA: Diagnosis not present

## 2017-07-21 DIAGNOSIS — Z170 Estrogen receptor positive status [ER+]: Secondary | ICD-10-CM | POA: Diagnosis not present

## 2017-07-21 DIAGNOSIS — M79622 Pain in left upper arm: Secondary | ICD-10-CM | POA: Diagnosis not present

## 2017-07-21 DIAGNOSIS — M25611 Stiffness of right shoulder, not elsewhere classified: Secondary | ICD-10-CM

## 2017-07-21 DIAGNOSIS — R293 Abnormal posture: Secondary | ICD-10-CM | POA: Diagnosis not present

## 2017-07-21 DIAGNOSIS — M79621 Pain in right upper arm: Secondary | ICD-10-CM

## 2017-07-21 NOTE — Therapy (Signed)
[place], [place], [address] Phone: [phone]   Fax:  [phone]  Physical Therapy Treatment  Patient Details  Name: Stephanie Holden MRN: [id number] Date of Birth: December 04, 1975 Referring Provider: Dr. [name]   Encounter Date: 07/21/2017  PT End of Session - 07/21/17 1225    Visit Number  3    Number of Visits  12    Date for PT Re-Evaluation  08/14/17    PT Start Time  1103    PT Stop Time  1150    PT Time Calculation (min)  47 min    Activity Tolerance  Patient tolerated treatment well    Behavior During Therapy  [facility] for tasks assessed/performed       Past Medical History:  Diagnosis Date  . Cancer [facility])    Breast cancer - right  . Constipation   . Depression   . Genetic testing 05/27/2017   Breast/GYN panel (23 genes) @ [facility] - No pathogenic mutations detected  . GERD (gastroesophageal reflux disease)   . Insulin dependent type 1 diabetes mellitus ([place])   . Insulin pump in place   . PONV (postoperative nausea and vomiting)   . Stenosing tenosynovitis of thumb 06/2014   right  . Thyroid goiter     Past Surgical History:  Procedure Laterality Date  . AXILLARY LYMPH NODE DISSECTION Right 06/26/2017   Procedure: AXILLARY LYMPH NODE DISSECTION;  Surgeon: [name], MD;  Location: [place];  Service: General;  Laterality: Right;  . CARPAL TUNNEL RELEASE Bilateral 10/28/2013   Procedure: BILATERAL CARPAL TUNNEL RELEASE;  Surgeon: Daryll Brod, MD;  Location: [place];  Service: Orthopedics;  Laterality: Bilateral;  . CESAREAN SECTION  2000; 08/20/2001  . ESSURE TUBAL LIGATION  2005   failed, tubal puncture  . LAPAROSCOPIC TUBAL LIGATION  04/02/2007   removal [place]  . LAPAROSCOPIC UNILATERAL SALPINGECTOMY  2005  . MASTECTOMY W/ SENTINEL NODE BIOPSY Bilateral 06/05/2017   Procedure: BILATERAL MASTECTOMIES  WITH RIGHT SENTINEL LYMPH NODE BIOPSY;  Surgeon: [name], MD;   Location: [facility];  Service: General;  Laterality: Bilateral;  . PORTACATH PLACEMENT Left 06/26/2017   Procedure: INSERTION PORT-A-CATH;  Surgeon: [name], MD;  Location: [place];  Service: General;  Laterality: Left;  . TRIGGER FINGER RELEASE Left 04/12/2014   Procedure: RELEASE A-1 PULLEY LEFT THUMB;  Surgeon: Daryll Brod, MD;  Location: [place];  Service: Orthopedics;  Laterality: Left;  . TRIGGER FINGER RELEASE Right 07/19/2014   Procedure: RELEASE TRIGGER FINGER/A-1 PULLEY RIGHT THUMB;  Surgeon: Daryll Brod, MD;  Location: [place];  Service: Orthopedics;  Laterality: Right;    There were no vitals filed for this visit.  Subjective Assessment - 07/21/17 1106    Subjective  Right side drains were removed 07/07/17 and the left side drain removed on 07/17/17. I've stayed pretty inactive but I'm cooking and cleaning. Still having trouble reaching overhead and doing some heavier activites.    Pertinent History  Patient was diagnosed on 05/08/17 with right invasive ductal carcinoma breast cancer. She was seen in 5/18 and found to have a right axillary mass but did not f/u for further testing. She then had a mammogram in 2/19 and was diagnosed with right breast cancer which had metastasized to the axillary lymph nodes. She had multiple small masses in the lower outer quadrant of her right breast with the largest mass measuring 1.3 cm. It is ER/PR positive and  HER2 negative with a Ki67 of 2%. Patient underwent a bilateral mastectomy with right targeted node dissection with 4/7 positive on 06/05/17 for right invasive ductal carcinoma. She then underwent a right axillary node dissection on 06/26/17 with 2/20 positive nodes. She also had a port-a-cath placed on 06/26/17. She currently has 3 drains in place and will see her surgeon on 07/17/17. The plan is chemotherapy, radiation, and anti-estrogen therapy.     Patient Stated Goals  Try to get arm moving and reduce lymphedema risk     Currently in Pain?  No/denies                       [facility] Adult PT Treatment/Exercise - 07/21/17 0001      Exercises   Exercises  Shoulder      Shoulder Exercises: Standing   Other Standing Exercises  Standing with back against wall trying to hold shoulders against wall for erect posture. Pt trying to do snow angels on wall into abduction.      Shoulder Exercises: Pulleys   Flexion  3 minutes    Flexion Limitations  Verbal cues to work in painfree ROM    ABduction  3 minutes    ABduction Limitations  Verbal cues to work in painfree ROM      Shoulder Exercises: Therapy Ball   Flexion  5 reps;Both    Flexion Limitations  PT demo and verbal cues for proper technique to prevent scaplar compensation    ABduction  5 reps;Both    ABduction Limitations  PT demo and verbal cues for proper technique to prevent scaplar compensation      Manual Therapy   Manual Therapy  Passive ROM;Neural Stretch    Passive ROM  PROM to bilateral shoulders in supine focused on flexion and abduction. All PROM to pt tolerance. She reported feeling relief in her arms with PROM. PROM performed very slowly and gently,    Neural Stretch  Passive neural stretch to rt UE in supine to pt tolerance. Cording noted into her elbow.             PT Education - 07/21/17 1150    Education provided  Yes    Education Details  Supine cane flexion    Person(s) Educated  Patient    Methods  Explanation    Comprehension  Verbalized understanding;Returned demonstration;Verbal cues required          PT Long Term Goals - 07/03/17 0930      PT LONG TERM GOAL #1   Title  Patient will demonstrate she has returned to baseline related to shoulder ROM and function.    Time  6    Period  Weeks    Status  New      PT LONG TERM GOAL #2   Title  Patient will increase bilateral shoulder flexion AROM to >/= 140 degrees for increased ease reaching overhead.    Time  6    Period  Weeks    Status  New       PT LONG TERM GOAL #3   Title  Patient will increase bilateral shoulder abduction AROM to >/= 140 degrees for increased ease reaching overhead.    Time  6    Period  Weeks    Status  New      PT LONG TERM GOAL #4   Title  Patient will report she has been able to return to >/= 75% of normal daily tasks.  Time  6    Period  Weeks    Status  New      PT LONG TERM GOAL #5   Title  Patient is able to verbalize understanding of where and how to be fitted for a compression sleeve for flying and also risk reduction practices for lymphedema.    Time  6    Period  Weeks    Status  New      Breast Clinic Goals - 05/15/17 0803      Patient will be able to verbalize understanding of pertinent lymphedema risk reduction practices relevant to her diagnosis specifically related to skin care.   Time  1    Period  Days    Status  Achieved      Patient will be able to return demonstrate and/or verbalize understanding of the post-op home exercise program related to regaining shoulder range of motion.   Time  1    Period  Days    Status  Achieved      Patient will be able to verbalize understanding of the importance of attending the postoperative After Breast Cancer Class for further lymphedema risk reduction education and therapeutic exercise.   Time  1    Period  Days    Status  Achieved           Plan - 07/21/17 1226    Clinical Impression Statement  Patient has significant ROM limitations in bilateral shoulders with left ironically being more limited that her right where she had her ALND. She had notably increased ROM at end of treatment today and reported feeling looser. Drains are removed but fluid still present on chest wall which will likely reduce in time. If not, we can consider manual lymph drainage.    Rehab Potential  Excellent    Clinical Impairments Affecting Rehab Potential  Recent surgery with extensive axillary dissection    PT Frequency  2x / week    PT Duration  4 weeks     PT Treatment/Interventions  ADLs/Self Care Home Management;Therapeutic activities;Therapeutic exercise;Patient/family education;Manual techniques;Manual lymph drainage;Scar mobilization;Passive range of motion    PT Next Visit Plan  Continue pulleys, ROM exercises, postural exercises, PROM bil shoulders    PT Home Exercise Plan  Supine cane flexion and standing with back against wall doing snow angels    Consulted and Agree with Plan of Care  Patient       Patient will benefit from skilled therapeutic intervention in order to improve the following deficits and impairments:  Pain, Decreased scar mobility, Decreased range of motion, Impaired UE functional use, Postural dysfunction, Decreased knowledge of precautions, Increased fascial restricitons, Decreased activity tolerance, Decreased knowledge of use of DME, Increased edema  Visit Diagnosis: Abnormal posture  Aftercare following surgery for neoplasm  Stiffness of left shoulder, not elsewhere classified  Stiffness of right shoulder, not elsewhere classified  Pain in left upper arm  Pain in right upper arm     Problem List Patient Active Problem List   Diagnosis Date Noted  . Breast cancer metastasized to axillary lymph node, right ([place]) 06/05/2017  . Genetic testing 05/27/2017  . Malignant neoplasm of axillary tail of right breast in female, estrogen receptor positive ([place]) 05/09/2017  . Cubital tunnel syndrome on left 10/02/2015  . Trigger finger, left index finger 04/12/2015  . Erroneous encounter - disregard 09/22/2014  . Diabetes type 1, controlled ([place]) 01/12/2013    Annia Friendly, PT 07/21/17 12:30 PM  [facility]  [place], [place], [address] Phone: [phone]   Fax:  [phone]  Name: Stephanie Holden MRN: [id number] Date of Birth: 1976-01-22

## 2017-07-21 NOTE — Patient Instructions (Signed)
Flexion (Eccentric) - Active (Cane)    DO THIS LAYING ON YOUR BACK! Lift cane with both hands. Avoid hiking shoulders. Lower cane slowly for 3-5 seconds. _10__ reps per set, _2__ sets per day, __7_ days per week.   http://ecce.exer.us/155   Copyright  VHI. All rights reserved.

## 2017-07-22 ENCOUNTER — Inpatient Hospital Stay (HOSPITAL_BASED_OUTPATIENT_CLINIC_OR_DEPARTMENT_OTHER): Payer: BLUE CROSS/BLUE SHIELD | Admitting: Hematology and Oncology

## 2017-07-22 ENCOUNTER — Inpatient Hospital Stay: Payer: BLUE CROSS/BLUE SHIELD

## 2017-07-22 ENCOUNTER — Encounter: Payer: Self-pay | Admitting: *Deleted

## 2017-07-22 ENCOUNTER — Other Ambulatory Visit: Payer: BLUE CROSS/BLUE SHIELD

## 2017-07-22 ENCOUNTER — Ambulatory Visit: Payer: BLUE CROSS/BLUE SHIELD

## 2017-07-22 ENCOUNTER — Ambulatory Visit: Payer: BLUE CROSS/BLUE SHIELD | Admitting: Hematology and Oncology

## 2017-07-22 VITALS — BP 153/82 | HR 67 | Temp 98.5°F | Resp 18 | Wt 164.4 lb

## 2017-07-22 DIAGNOSIS — C50911 Malignant neoplasm of unspecified site of right female breast: Secondary | ICD-10-CM

## 2017-07-22 DIAGNOSIS — E119 Type 2 diabetes mellitus without complications: Secondary | ICD-10-CM

## 2017-07-22 DIAGNOSIS — C50611 Malignant neoplasm of axillary tail of right female breast: Secondary | ICD-10-CM

## 2017-07-22 DIAGNOSIS — C773 Secondary and unspecified malignant neoplasm of axilla and upper limb lymph nodes: Principal | ICD-10-CM

## 2017-07-22 DIAGNOSIS — Z170 Estrogen receptor positive status [ER+]: Secondary | ICD-10-CM

## 2017-07-22 DIAGNOSIS — Z794 Long term (current) use of insulin: Secondary | ICD-10-CM

## 2017-07-22 DIAGNOSIS — Z5111 Encounter for antineoplastic chemotherapy: Secondary | ICD-10-CM | POA: Diagnosis not present

## 2017-07-22 LAB — CBC WITH DIFFERENTIAL (CANCER CENTER ONLY)
Basophils Absolute: 0.1 10*3/uL (ref 0.0–0.1)
Basophils Relative: 1 %
Eosinophils Absolute: 0.2 10*3/uL (ref 0.0–0.5)
Eosinophils Relative: 2 %
HEMATOCRIT: 40.3 % (ref 34.8–46.6)
HEMOGLOBIN: 13 g/dL (ref 11.6–15.9)
LYMPHS ABS: 1.5 10*3/uL (ref 0.9–3.3)
LYMPHS PCT: 18 %
MCH: 26.8 pg (ref 25.1–34.0)
MCHC: 32.2 g/dL (ref 31.5–36.0)
MCV: 83.4 fL (ref 79.5–101.0)
MONOS PCT: 6 %
Monocytes Absolute: 0.5 10*3/uL (ref 0.1–0.9)
NEUTROS ABS: 6 10*3/uL (ref 1.5–6.5)
NEUTROS PCT: 73 %
Platelet Count: 347 10*3/uL (ref 145–400)
RBC: 4.83 MIL/uL (ref 3.70–5.45)
RDW: 14.5 % (ref 11.2–14.5)
WBC: 8.2 10*3/uL (ref 3.9–10.3)

## 2017-07-22 LAB — CMP (CANCER CENTER ONLY)
ALK PHOS: 78 U/L (ref 40–150)
ALT: 13 U/L (ref 0–55)
ANION GAP: 8 (ref 3–11)
AST: 17 U/L (ref 5–34)
Albumin: 3.5 g/dL (ref 3.5–5.0)
BILIRUBIN TOTAL: 0.4 mg/dL (ref 0.2–1.2)
BUN: 12 mg/dL (ref 7–26)
CALCIUM: 8.9 mg/dL (ref 8.4–10.4)
CO2: 28 mmol/L (ref 22–29)
Chloride: 103 mmol/L (ref 98–109)
Creatinine: 0.88 mg/dL (ref 0.60–1.10)
GFR, Estimated: >60 mL/min (ref >60)
Glucose, Bld: 218 mg/dL — ABNORMAL HIGH (ref 70–140)
Potassium: 3.8 mmol/L (ref 3.5–5.1)
SODIUM: 139 mmol/L (ref 136–145)
TOTAL PROTEIN: 7.3 g/dL (ref 6.4–8.3)

## 2017-07-22 MED ORDER — DOXORUBICIN HCL CHEMO IV INJECTION 2 MG/ML
60.0000 mg/m2 | Freq: Once | INTRAVENOUS | Status: AC
  Administered 2017-07-22: 110 mg via INTRAVENOUS
  Filled 2017-07-22: qty 55

## 2017-07-22 MED ORDER — SODIUM CHLORIDE 0.9 % IV SOLN
Freq: Once | INTRAVENOUS | Status: AC
  Administered 2017-07-22: 11:00:00 via INTRAVENOUS

## 2017-07-22 MED ORDER — SODIUM CHLORIDE 0.9% FLUSH
10.0000 mL | INTRAVENOUS | Status: DC | PRN
  Administered 2017-07-22: 10 mL
  Filled 2017-07-22: qty 10

## 2017-07-22 MED ORDER — SODIUM CHLORIDE 0.9 % IV SOLN
Freq: Once | INTRAVENOUS | Status: AC
  Administered 2017-07-22: 11:00:00 via INTRAVENOUS
  Filled 2017-07-22: qty 5

## 2017-07-22 MED ORDER — SODIUM CHLORIDE 0.9 % IV SOLN
600.0000 mg/m2 | Freq: Once | INTRAVENOUS | Status: AC
  Administered 2017-07-22: 1100 mg via INTRAVENOUS
  Filled 2017-07-22: qty 55

## 2017-07-22 MED ORDER — PALONOSETRON HCL INJECTION 0.25 MG/5ML
0.2500 mg | Freq: Once | INTRAVENOUS | Status: AC
  Administered 2017-07-22: 0.25 mg via INTRAVENOUS

## 2017-07-22 MED ORDER — HEPARIN SOD (PORK) LOCK FLUSH 100 UNIT/ML IV SOLN
500.0000 [IU] | Freq: Once | INTRAVENOUS | Status: AC | PRN
  Administered 2017-07-22: 500 [IU]
  Filled 2017-07-22: qty 5

## 2017-07-22 NOTE — Progress Notes (Signed)
Patient Care Team: Tisovec, Fransico Him, MD as PCP - General (Internal Medicine)  DIAGNOSIS:  Encounter Diagnoses  Name Primary?  . Breast cancer metastasized to axillary lymph node, right ([place]) Yes  . Malignant neoplasm of axillary tail of right breast in female, estrogen receptor positive ([place])     SUMMARY OF ONCOLOGIC HISTORY:   Malignant neoplasm of axillary tail of right breast in female, estrogen receptor positive ([place])   05/09/2017 Initial Diagnosis    May 2018: Palpable right axillary mass.  The recommended a 6-week follow-up but apparently the mass decreased in size.  Recent increase in the mass was noted, biopsy revealed Grade 1 IDC with DCIS, lymphovascular invasion present, 4 small hypoechoic masses largest 0.9 cm, T1BN1 stage Ib clinical stage      06/05/2017 Surgery    Bilateral mastectomies: Right mastectomy: IDC grade 1, 3 foci 1.2 cm, 1.2 cm, 1 cm, IgA DCIS, LVH diffuse present, margins negative, 4/7 lymph nodes positive with extracapsular extension, ER 95%, PR 95%, HER-2 negative, Ki-67 2% to 10%, T1 cN2 aM0 stage II a pathological stage; left mastectomy: Benign      06/26/2017 Surgery    Axillary lymph node dissection: 2/22 lymph nodes positive (making a total of 6+ lymph nodes)      07/22/2017 -  Chemotherapy    Adjuvant chemotherapy with dose dense Adriamycin and Cytoxan x4 followed by Taxol weekly x12        Breast cancer metastasized to axillary lymph node, right ([place])   06/05/2017 Initial Diagnosis    Breast cancer metastasized to axillary lymph node, right ([place])      07/22/2017 -  Chemotherapy    Adjuvant chemotherapy with dose dense Adriamycin and Cytoxan x4 followed by Taxol weekly x12        CHIEF COMPLIANT: Cycle 1 day 1 dose dense Adriamycin and Cytoxan adjuvant chemotherapy  INTERVAL HISTORY: Stephanie Holden is a 42 year old with above-mentioned history of right breast cancer with 6+ lymph nodes in the axilla who is here today to receive her first  cycle of adjuvant chemotherapy with dose dense Adriamycin and Cytoxan.  Today is cycle 1 day 1.  She is healing very well from the recent surgery of axillary lymph node dissection.  She is anxious to get started.  REVIEW OF SYSTEMS:   Constitutional: Denies fevers, chills or abnormal weight loss Eyes: Denies blurriness of vision Ears, nose, mouth, throat, and face: Denies mucositis or sore throat Respiratory: Denies cough, dyspnea or wheezes Cardiovascular: Denies palpitation, chest discomfort Gastrointestinal:  Denies nausea, heartburn or change in bowel habits Skin: Denies abnormal skin rashes Lymphatics: Denies new lymphadenopathy or easy bruising Neurological:Denies numbness, tingling or new weaknesses Behavioral/Psych: Mood is stable, no new changes  Extremities: No lower extremity edema Breast: Healed very well from recent surgery All other systems were reviewed with the patient and are negative.  I have reviewed the past medical history, past surgical history, social history and family history with the patient and they are unchanged from previous note.  ALLERGIES:  is allergic to dilaudid [hydromorphone hcl].  MEDICATIONS:  Current Outpatient Medications  Medication Sig Dispense Refill  . acetaminophen (TYLENOL) 500 MG tablet Take 1,000 mg by mouth every 6 (six) hours as needed for moderate pain or headache.    . dexamethasone (DECADRON) 2 MG tablet Take 1 tablet day after chemotherapy and 1 tablet 2 days after with food. (Patient not taking: Reported on 07/09/2017) 10 tablet 0  . gabapentin (NEURONTIN) 300 MG capsule  Take 1 capsule (300 mg total) by mouth at bedtime. 30 capsule 0  . insulin aspart (NOVOLOG) 100 UNIT/ML injection Inject 45 Units into the skin See admin instructions. Use via insulin pump up to 45 units daily    . lidocaine-prilocaine (EMLA) cream Apply to affected area once (Patient not taking: Reported on 07/09/2017) 30 g 3  . LORazepam (ATIVAN) 0.5 MG tablet Take 1  tablet (0.5 mg total) by mouth every 6 (six) hours as needed for anxiety or sleep. 30 tablet 3  . methocarbamol (ROBAXIN) 500 MG tablet Take 1 tablet (500 mg total) by mouth every 8 (eight) hours as needed for muscle spasms. 30 tablet 1  . ondansetron (ZOFRAN) 8 MG tablet Take 1 tablet (8 mg total) by mouth 2 (two) times daily as needed. Start on the third day after chemotherapy. (Patient not taking: Reported on 07/09/2017) 30 tablet 1  . Probiotic Product (PROBIOTIC DAILY PO) Take 1 capsule by mouth at bedtime.     . prochlorperazine (COMPAZINE) 10 MG tablet Take 1 tablet (10 mg total) by mouth every 6 (six) hours as needed (Nausea or vomiting). (Patient not taking: Reported on 07/09/2017) 30 tablet 1  . tamoxifen (NOLVADEX) 20 MG tablet TAKE 1 TABLET BY MOUTH EVERY DAY 30 tablet 0  . venlafaxine XR (EFFEXOR-XR) 37.5 MG 24 hr capsule Take 1 capsule (37.5 mg total) by mouth daily with breakfast. (Patient taking differently: Take 37.5 mg by mouth at bedtime. ) 30 capsule 3   No current facility-administered medications for this visit.     PHYSICAL EXAMINATION: ECOG PERFORMANCE STATUS: 1 - Symptomatic but completely ambulatory  Vitals:   07/22/17 0950  BP: (!) 153/82  Pulse: 67  Resp: 18  Temp: 98.5 F (36.9 C)  SpO2: 100%   Filed Weights   07/22/17 0950  Weight: 164 lb 6.4 oz (74.6 kg)    GENERAL:alert, no distress and comfortable SKIN: skin color, texture, turgor are normal, no rashes or significant lesions EYES: normal, Conjunctiva are pink and non-injected, sclera clear OROPHARYNX:no exudate, no erythema and lips, buccal mucosa, and tongue normal  NECK: supple, thyroid normal size, non-tender, without nodularity LYMPH:  no palpable lymphadenopathy in the cervical, axillary or inguinal LUNGS: clear to auscultation and percussion with normal breathing effort HEART: regular rate & rhythm and no murmurs and no lower extremity edema ABDOMEN:abdomen soft, non-tender and normal bowel  sounds MUSCULOSKELETAL:no cyanosis of digits and no clubbing  NEURO: alert & oriented x 3 with fluent speech, no focal motor/sensory deficits EXTREMITIES: No lower extremity edema  LABORATORY DATA:  I have reviewed the data as listed CMP Latest Ref Rng & Units 07/22/2017 07/09/2017 06/27/2017  Glucose 70 - 140 mg/dL 218(H) 126 151(H)  BUN 7 - 26 mg/dL _0 Creatinine 0.60 - 1.10 mg/dL 0.88 0.82 0.73  Sodium 136 - 145 mmol/L 139 139 135  Potassium 3.5 - 5.1 mmol/L 3.8 4.0 4.0  Chloride 98 - 109 mmol/L 103 108 105  CO2 22 - 29 mmol/L _1 Calcium 8.4 - 10.4 mg/dL 8.9 8.9 8.5(L)  Total Protein 6.4 - 8.3 g/dL 7.3 6.8 -  Total Bilirubin 0.2 - 1.2 mg/dL 0.4 0.3 -  Alkaline Phos 40 - 150 U/L 78 68 -  AST 5 - 34 U/L 17 12 -  ALT 0 - 55 U/L 13 9 -    Lab Results  Component Value Date   WBC 8.2 07/22/2017   HGB 13.0 07/22/2017   HCT 40.3  07/22/2017   MCV 83.4 07/22/2017   PLT 347 07/22/2017   NEUTROABS 6.0 07/22/2017    ASSESSMENT & PLAN:  Malignant neoplasm of axillary tail of right breast in female, estrogen receptor positive ([place]) 06/05/2017:Bilateral mastectomies: Right mastectomy: IDC grade 1, 3 foci 1.2 cm, 1.2 cm, 1 cm, IgA DCIS, LVH diffuse present, margins negative, 4/7 lymph nodes positive with extracapsular extension, ER 95%, PR 95%, HER-2 negative, Ki-67 2% to 10%, T1 cN2 aM0 stage II a pathological stage; left mastectomy: Benign  Mammaprint done preoperatively: Low risk Axillary lymph node dissection 06/26/2017: 2/22 lymph nodes positive (making a total of 6 lymph nodes that were positive)  Treatment plan: 1. Because of her high risk features (6 LN Pos), we recommended adjuvant systemic chemotherapy with dose dense Adriamycin and Cytoxan x4 followed by Taxol weekly x12 started 07/22/2017 3.Followed by radiation.  4.Followed by adjuvant antiestrogen therapy   UPBEAT clinical trial ([id number]): No toxicities related to the  trial --------------------------------------------------------------------------- Current treatment: Cycle 1 day 1 dose dense Adriamycin and Cytoxan Antiemetics were discussed Chemo consent obtained Chemo education completed Labs reviewed Echocardiogram 06/20/2017: EF 55 to 60%  Patient is diabetic and has high blood sugar today.  I discussed her about discontinuing addition of Decadron.  But she would like to keep the Decadron and adjust her insulin pump accordingly.  She will bring that record to follow her blood sugars and we may have to make a decision on the dexamethasone for the next cycle depending on her blood sugars.  Return to clinic in 1 week for toxicity check     No orders of the defined types were placed in this encounter.  The patient has a good understanding of the overall plan. she agrees with it. she will call with any problems that may develop before the next visit here.   Harriette Ohara, MD 07/22/17

## 2017-07-22 NOTE — Patient Instructions (Signed)
[facility] Discharge Instructions for Patients Receiving Chemotherapy  Today you received the following chemotherapy agents: Adriamycin and Cytoxan.  To help prevent nausea and vomiting after your treatment, we encourage you to take your nausea medication as prescribed.    If you develop nausea and vomiting that is not controlled by your nausea medication, call the clinic.   BELOW ARE SYMPTOMS THAT SHOULD BE REPORTED IMMEDIATELY:  *FEVER GREATER THAN 100.5 F  *CHILLS WITH OR WITHOUT FEVER  NAUSEA AND VOMITING THAT IS NOT CONTROLLED WITH YOUR NAUSEA MEDICATION  *UNUSUAL SHORTNESS OF BREATH  *UNUSUAL BRUISING OR BLEEDING  TENDERNESS IN MOUTH AND THROAT WITH OR WITHOUT PRESENCE OF ULCERS  *URINARY PROBLEMS  *BOWEL PROBLEMS  UNUSUAL RASH Items with * indicate a potential emergency and should be followed up as soon as possible.  Feel free to call the clinic should you have any questions or concerns. The clinic phone number is [phone].  Please show the [facility] at check-in to the Emergency Department and triage nurse.  Doxorubicin (Adriamycin) injection What is this medicine? DOXORUBICIN (dox oh ROO bi sin) is a chemotherapy drug. It is used to treat many kinds of cancer like leukemia, lymphoma, neuroblastoma, sarcoma, and Wilms' tumor. It is also used to treat bladder cancer, breast cancer, lung cancer, ovarian cancer, stomach cancer, and thyroid cancer. This medicine may be used for other purposes; ask your health care provider or pharmacist if you have questions. COMMON BRAND NAME(S): Adriamycin, Adriamycin PFS, Adriamycin RDF, Rubex What should I tell my health care provider before I take this medicine? They need to know if you have any of these conditions: -heart disease -history of low blood counts caused by a medicine -liver disease -recent or ongoing radiation therapy -an unusual or allergic reaction to doxorubicin, other chemotherapy  agents, other medicines, foods, dyes, or preservatives -pregnant or trying to get pregnant -breast-feeding How should I use this medicine? This drug is given as an infusion into a vein. It is administered in a hospital or clinic by a specially trained health care professional. If you have pain, swelling, burning or any unusual feeling around the site of your injection, tell your health care professional right away. Talk to your pediatrician regarding the use of this medicine in children. Special care may be needed. Overdosage: If you think you have taken too much of this medicine contact a poison control center or emergency room at once. NOTE: This medicine is only for you. Do not share this medicine with others. What if I miss a dose? It is important not to miss your dose. Call your doctor or health care professional if you are unable to keep an appointment. What may interact with this medicine? This medicine may interact with the following medications: -6-mercaptopurine -paclitaxel -phenytoin -St. John's Wort -trastuzumab -verapamil This list may not describe all possible interactions. Give your health care provider a list of all the medicines, herbs, non-prescription drugs, or dietary supplements you use. Also tell them if you smoke, drink alcohol, or use illegal drugs. Some items may interact with your medicine. What should I watch for while using this medicine? This drug may make you feel generally unwell. This is not uncommon, as chemotherapy can affect healthy cells as well as cancer cells. Report any side effects. Continue your course of treatment even though you feel ill unless your doctor tells you to stop. There is a maximum amount of this medicine you should receive throughout your life. The amount  depends on the medical condition being treated and your overall health. Your doctor will watch how much of this medicine you receive in your lifetime. Tell your doctor if you have taken  this medicine before. You may need blood work done while you are taking this medicine. Your urine may turn red for a few days after your dose. This is not blood. If your urine is dark or brown, call your doctor. In some cases, you may be given additional medicines to help with side effects. Follow all directions for their use. Call your doctor or health care professional for advice if you get a fever, chills or sore throat, or other symptoms of a cold or flu. Do not treat yourself. This drug decreases your body's ability to fight infections. Try to avoid being around people who are sick. This medicine may increase your risk to bruise or bleed. Call your doctor or health care professional if you notice any unusual bleeding. Talk to your doctor about your risk of cancer. You may be more at risk for certain types of cancers if you take this medicine. Do not become pregnant while taking this medicine or for 6 months after stopping it. Women should inform their doctor if they wish to become pregnant or think they might be pregnant. Men should not father a child while taking this medicine and for 6 months after stopping it. There is a potential for serious side effects to an unborn child. Talk to your health care professional or pharmacist for more information. Do not breast-feed an infant while taking this medicine. This medicine has caused ovarian failure in some women and reduced sperm counts in some men This medicine may interfere with the ability to have a child. Talk with your doctor or health care professional if you are concerned about your fertility. What side effects may I notice from receiving this medicine? Side effects that you should report to your doctor or health care professional as soon as possible: -allergic reactions like skin rash, itching or hives, swelling of the face, lips, or tongue -breathing problems -chest pain -fast or irregular heartbeat -low blood counts - this medicine may  decrease the number of white blood cells, red blood cells and platelets. You may be at increased risk for infections and bleeding. -pain, redness, or irritation at site where injected -signs of infection - fever or chills, cough, sore throat, pain or difficulty passing urine -signs of decreased platelets or bleeding - bruising, pinpoint red spots on the skin, black, tarry stools, blood in the urine -swelling of the ankles, feet, hands -tiredness -weakness Side effects that usually do not require medical attention (report to your doctor or health care professional if they continue or are bothersome): -diarrhea -hair loss -mouth sores -nail discoloration or damage -nausea -red colored urine -vomiting This list may not describe all possible side effects. Call your doctor for medical advice about side effects. You may report side effects to FDA at 1-800-FDA-1088. Where should I keep my medicine? This drug is given in a hospital or clinic and will not be stored at home. NOTE: This sheet is a summary. It may not cover all possible information. If you have questions about this medicine, talk to your doctor, pharmacist, or health care provider.  2018 Elsevier/Gold Standard (2015-05-08 11:28:51)  Cyclophosphamide (Cytoxan) injection What is this medicine? CYCLOPHOSPHAMIDE (sye kloe FOSS fa mide) is a chemotherapy drug. It slows the growth of cancer cells. This medicine is used to treat many types of  cancer like lymphoma, myeloma, leukemia, breast cancer, and ovarian cancer, to name a few. This medicine may be used for other purposes; ask your health care provider or pharmacist if you have questions. COMMON BRAND NAME(S): Cytoxan, Neosar What should I tell my health care provider before I take this medicine? They need to know if you have any of these conditions: -blood disorders -history of other chemotherapy -infection -kidney disease -liver disease -recent or ongoing radiation  therapy -tumors in the bone marrow -an unusual or allergic reaction to cyclophosphamide, other chemotherapy, other medicines, foods, dyes, or preservatives -pregnant or trying to get pregnant -breast-feeding How should I use this medicine? This drug is usually given as an injection into a vein or muscle or by infusion into a vein. It is administered in a hospital or clinic by a specially trained health care professional. Talk to your pediatrician regarding the use of this medicine in children. Special care may be needed. Overdosage: If you think you have taken too much of this medicine contact a poison control center or emergency room at once. NOTE: This medicine is only for you. Do not share this medicine with others. What if I miss a dose? It is important not to miss your dose. Call your doctor or health care professional if you are unable to keep an appointment. What may interact with this medicine? This medicine may interact with the following medications: -amiodarone -amphotericin B -azathioprine -certain antiviral medicines for HIV or AIDS such as protease inhibitors (e.g., indinavir, ritonavir) and zidovudine -certain blood pressure medications such as benazepril, captopril, enalapril, fosinopril, lisinopril, moexipril, monopril, perindopril, quinapril, ramipril, trandolapril -certain cancer medications such as anthracyclines (e.g., daunorubicin, doxorubicin), busulfan, cytarabine, paclitaxel, pentostatin, tamoxifen, trastuzumab -certain diuretics such as chlorothiazide, chlorthalidone, hydrochlorothiazide, indapamide, metolazone -certain medicines that treat or prevent blood clots like warfarin -certain muscle relaxants such as succinylcholine -cyclosporine -etanercept -indomethacin -medicines to increase blood counts like filgrastim, pegfilgrastim, sargramostim -medicines used as general anesthesia -metronidazole -natalizumab This list may not describe all possible  interactions. Give your health care provider a list of all the medicines, herbs, non-prescription drugs, or dietary supplements you use. Also tell them if you smoke, drink alcohol, or use illegal drugs. Some items may interact with your medicine. What should I watch for while using this medicine? Visit your doctor for checks on your progress. This drug may make you feel generally unwell. This is not uncommon, as chemotherapy can affect healthy cells as well as cancer cells. Report any side effects. Continue your course of treatment even though you feel ill unless your doctor tells you to stop. Drink water or other fluids as directed. Urinate often, even at night. In some cases, you may be given additional medicines to help with side effects. Follow all directions for their use. Call your doctor or health care professional for advice if you get a fever, chills or sore throat, or other symptoms of a cold or flu. Do not treat yourself. This drug decreases your body's ability to fight infections. Try to avoid being around people who are sick. This medicine may increase your risk to bruise or bleed. Call your doctor or health care professional if you notice any unusual bleeding. Be careful brushing and flossing your teeth or using a toothpick because you may get an infection or bleed more easily. If you have any dental work done, tell your dentist you are receiving this medicine. You may get drowsy or dizzy. Do not drive, use machinery, or do anything that  needs mental alertness until you know how this medicine affects you. Do not become pregnant while taking this medicine or for 1 year after stopping it. Women should inform their doctor if they wish to become pregnant or think they might be pregnant. Men should not father a child while taking this medicine and for 4 months after stopping it. There is a potential for serious side effects to an unborn child. Talk to your health care professional or pharmacist for  more information. Do not breast-feed an infant while taking this medicine. This medicine may interfere with the ability to have a child. This medicine has caused ovarian failure in some women. This medicine has caused reduced sperm counts in some men. You should talk with your doctor or health care professional if you are concerned about your fertility. If you are going to have surgery, tell your doctor or health care professional that you have taken this medicine. What side effects may I notice from receiving this medicine? Side effects that you should report to your doctor or health care professional as soon as possible: -allergic reactions like skin rash, itching or hives, swelling of the face, lips, or tongue -low blood counts - this medicine may decrease the number of white blood cells, red blood cells and platelets. You may be at increased risk for infections and bleeding. -signs of infection - fever or chills, cough, sore throat, pain or difficulty passing urine -signs of decreased platelets or bleeding - bruising, pinpoint red spots on the skin, black, tarry stools, blood in the urine -signs of decreased red blood cells - unusually weak or tired, fainting spells, lightheadedness -breathing problems -dark urine -dizziness -palpitations -swelling of the ankles, feet, hands -trouble passing urine or change in the amount of urine -weight gain -yellowing of the eyes or skin Side effects that usually do not require medical attention (report to your doctor or health care professional if they continue or are bothersome): -changes in nail or skin color -hair loss -missed menstrual periods -mouth sores -nausea, vomiting This list may not describe all possible side effects. Call your doctor for medical advice about side effects. You may report side effects to FDA at 1-800-FDA-1088. Where should I keep my medicine? This drug is given in a hospital or clinic and will not be stored at  home. NOTE: This sheet is a summary. It may not cover all possible information. If you have questions about this medicine, talk to your doctor, pharmacist, or health care provider.  2018 Elsevier/Gold Standard (2012-01-24 16:22:58)

## 2017-07-22 NOTE — Progress Notes (Signed)
Met patient in infusion room for several minutes during her first chemo treatment.  Gave her the $25 gift card for completing her baseline activities on the [employer] [address] study.  Also gave patient a copy of lab report from the study which included results of her Lipid panel, Glucose, Creatine, C-RP and Hematocrit.  Informed patient that all values were wnl except her blood glucose was 114 and C-RP 6.3.  Patient has been diabetic since childhood and checks her blood sugar several times daily.  Informed patient that the elevated C-RP may not be of any clinical significance.  It is a general indication of inflammation which can have multiple causes. Encouraged her to share results with her primary care physician. She verbalized understanding.  Informed patient of her next research labs will be drawn in one month on 08/19/17 along with her other already scheduled labs.  Thanked patient for her ongoing participation in this study.  Lab results reviewed by Dr. Lindi Adie and he states the C-RP result does not require any intervention.  Foye Spurling, BSN, RN Clinical Research Nurse 07/22/2017 12:10 PM

## 2017-07-22 NOTE — Assessment & Plan Note (Signed)
06/05/2017:Bilateral mastectomies: Right mastectomy: IDC grade 1, 3 foci 1.2 cm, 1.2 cm, 1 cm, IgA DCIS, LVH diffuse present, margins negative, 4/7 lymph nodes positive with extracapsular extension, ER 95%, PR 95%, HER-2 negative, Ki-67 2% to 10%, T1 cN2 aM0 stage II a pathological stage; left mastectomy: Benign  Mammaprint done preoperatively: Low risk Axillary lymph node dissection 06/26/2017: 2/22 lymph nodes positive (making a total of 6 lymph nodes that were positive)  Treatment plan: 1. Because of her high risk features (6 LN Pos), we recommended adjuvant systemic chemotherapy with dose dense Adriamycin and Cytoxan x4 followed by Taxol weekly x12 started 07/22/2017 3.Followed by radiation.  4.Followed by adjuvant antiestrogen therapy   UPBEAT clinical trial ([id number]): No toxicities related to the trial --------------------------------------------------------------------------- Current treatment: Cycle 1 day 1 dose dense Adriamycin and Cytoxan Antiemetics were discussed Chemo consent obtained Chemo education completed Labs reviewed Echocardiogram 06/20/2017: EF 55 to 60% Return to clinic in 1 week for toxicity check

## 2017-07-22 NOTE — Progress Notes (Signed)
Pt tolerated first time [facility] chemo today. Symptom/side effects reviewed with pt, along with medication management at home. Pt and husband questions and concerns addressed. Pt did have some mild nose burning towards the end of cytoxan infusion. Infused for an additional 15 minutes and pt tolerated well. Follow chemo call placed. Pt knows to call with any additional questions.   Encourage pt to start taking Claritin to help with bone pain for her upcoming neulasta shot.

## 2017-07-23 ENCOUNTER — Telehealth: Payer: Self-pay

## 2017-07-23 NOTE — Telephone Encounter (Signed)
Chemotherapy follow up call. Patient stated she is feeling well no issues over not other then not sleeping well, most likely due to the decadron. She has had slight nausea but using her medications. She is to call with any concerns or questions.  Cyndia Bent RN

## 2017-07-24 ENCOUNTER — Inpatient Hospital Stay: Payer: BLUE CROSS/BLUE SHIELD | Attending: Hematology and Oncology

## 2017-07-24 ENCOUNTER — Ambulatory Visit: Payer: BLUE CROSS/BLUE SHIELD | Attending: General Surgery

## 2017-07-24 VITALS — BP 152/68 | HR 93 | Temp 98.3°F | Resp 17

## 2017-07-24 DIAGNOSIS — Z5189 Encounter for other specified aftercare: Secondary | ICD-10-CM | POA: Diagnosis not present

## 2017-07-24 DIAGNOSIS — Z170 Estrogen receptor positive status [ER+]: Secondary | ICD-10-CM | POA: Diagnosis not present

## 2017-07-24 DIAGNOSIS — M25611 Stiffness of right shoulder, not elsewhere classified: Secondary | ICD-10-CM | POA: Insufficient documentation

## 2017-07-24 DIAGNOSIS — C50611 Malignant neoplasm of axillary tail of right female breast: Secondary | ICD-10-CM | POA: Diagnosis not present

## 2017-07-24 DIAGNOSIS — C773 Secondary and unspecified malignant neoplasm of axilla and upper limb lymph nodes: Secondary | ICD-10-CM | POA: Insufficient documentation

## 2017-07-24 DIAGNOSIS — M79622 Pain in left upper arm: Secondary | ICD-10-CM | POA: Insufficient documentation

## 2017-07-24 DIAGNOSIS — Z5111 Encounter for antineoplastic chemotherapy: Secondary | ICD-10-CM | POA: Diagnosis not present

## 2017-07-24 DIAGNOSIS — M79621 Pain in right upper arm: Secondary | ICD-10-CM | POA: Diagnosis not present

## 2017-07-24 DIAGNOSIS — M25612 Stiffness of left shoulder, not elsewhere classified: Secondary | ICD-10-CM | POA: Diagnosis not present

## 2017-07-24 DIAGNOSIS — E119 Type 2 diabetes mellitus without complications: Secondary | ICD-10-CM | POA: Insufficient documentation

## 2017-07-24 DIAGNOSIS — R293 Abnormal posture: Secondary | ICD-10-CM | POA: Diagnosis not present

## 2017-07-24 DIAGNOSIS — C50911 Malignant neoplasm of unspecified site of right female breast: Secondary | ICD-10-CM

## 2017-07-24 DIAGNOSIS — Z794 Long term (current) use of insulin: Secondary | ICD-10-CM | POA: Insufficient documentation

## 2017-07-24 DIAGNOSIS — Z483 Aftercare following surgery for neoplasm: Secondary | ICD-10-CM | POA: Diagnosis not present

## 2017-07-24 MED ORDER — PEGFILGRASTIM-CBQV 6 MG/0.6ML ~~LOC~~ SOSY
PREFILLED_SYRINGE | SUBCUTANEOUS | Status: AC
  Filled 2017-07-24: qty 0.6

## 2017-07-24 MED ORDER — PEGFILGRASTIM-CBQV 6 MG/0.6ML ~~LOC~~ SOSY
6.0000 mg | PREFILLED_SYRINGE | Freq: Once | SUBCUTANEOUS | Status: AC
  Administered 2017-07-24: 6 mg via SUBCUTANEOUS

## 2017-07-24 NOTE — Therapy (Signed)
[place], [place], [address] Phone: [phone]   Fax:  [phone]  Physical Therapy Treatment  Patient Details  Name: Stephanie Holden MRN: [id number] Date of Birth: 1976/01/18 Referring Provider: Dr. [name]   Encounter Date: 07/24/2017  PT End of Session - 07/24/17 1204    Visit Number  4    Number of Visits  12    Date for PT Re-Evaluation  08/14/17    PT Start Time  1107    PT Stop Time  1157    PT Time Calculation (min)  50 min    Activity Tolerance  Patient tolerated treatment well    Behavior During Therapy  [facility] for tasks assessed/performed       Past Medical History:  Diagnosis Date  . Cancer [facility])    Breast cancer - right  . Constipation   . Depression   . Genetic testing 05/27/2017   Breast/GYN panel (23 genes) @ [facility] - No pathogenic mutations detected  . GERD (gastroesophageal reflux disease)   . Insulin dependent type 1 diabetes mellitus ([place])   . Insulin pump in place   . PONV (postoperative nausea and vomiting)   . Stenosing tenosynovitis of thumb 06/2014   right  . Thyroid goiter     Past Surgical History:  Procedure Laterality Date  . AXILLARY LYMPH NODE DISSECTION Right 06/26/2017   Procedure: AXILLARY LYMPH NODE DISSECTION;  Surgeon: [name], MD;  Location: [place];  Service: General;  Laterality: Right;  . CARPAL TUNNEL RELEASE Bilateral 10/28/2013   Procedure: BILATERAL CARPAL TUNNEL RELEASE;  Surgeon: Daryll Brod, MD;  Location: [place];  Service: Orthopedics;  Laterality: Bilateral;  . CESAREAN SECTION  2000; 08/20/2001  . ESSURE TUBAL LIGATION  2005   failed, tubal puncture  . LAPAROSCOPIC TUBAL LIGATION  04/02/2007   removal Essure  . LAPAROSCOPIC UNILATERAL SALPINGECTOMY  2005  . MASTECTOMY W/ SENTINEL NODE BIOPSY Bilateral 06/05/2017   Procedure: BILATERAL MASTECTOMIES  WITH RIGHT SENTINEL LYMPH NODE BIOPSY;  Surgeon: [name], MD;   Location: [place];  Service: General;  Laterality: Bilateral;  . PORTACATH PLACEMENT Left 06/26/2017   Procedure: INSERTION PORT-A-CATH;  Surgeon: [name], MD;  Location: [place];  Service: General;  Laterality: Left;  . TRIGGER FINGER RELEASE Left 04/12/2014   Procedure: RELEASE A-1 PULLEY LEFT THUMB;  Surgeon: Daryll Brod, MD;  Location: [place];  Service: Orthopedics;  Laterality: Left;  . TRIGGER FINGER RELEASE Right 07/19/2014   Procedure: RELEASE TRIGGER FINGER/A-1 PULLEY RIGHT THUMB;  Surgeon: Daryll Brod, MD;  Location: [place];  Service: Orthopedics;  Laterality: Right;    There were no vitals filed for this visit.  Subjective Assessment - 07/24/17 1116    Subjective  I got pulleys after I left here last time. It feels so good to use those!     Pertinent History  Patient was diagnosed on 05/08/17 with right invasive ductal carcinoma breast cancer. She was seen in 5/18 and found to have a right axillary mass but did not f/u for further testing. She then had a mammogram in 2/19 and was diagnosed with right breast cancer which had metastasized to the axillary lymph nodes. She had multiple small masses in the lower outer quadrant of her right breast with the largest mass measuring 1.3 cm. It is ER/PR positive and HER2 negative with a Ki67 of 2%. Patient underwent a bilateral mastectomy with right targeted node  dissection with 4/7 positive on 06/05/17 for right invasive ductal carcinoma. She then underwent a right axillary node dissection on 06/26/17 with 2/20 positive nodes. She also had a port-a-cath placed on 06/26/17. She currently has 3 drains in place and will see her surgeon on 07/17/17. The plan is chemotherapy, radiation, and anti-estrogen therapy.     Patient Stated Goals  Try to get arm moving and reduce lymphedema risk    Currently in Pain?  No/denies         [facility] PT Assessment - 07/24/17 0001      AROM   Right Shoulder Extension  54 Degrees    Right  Shoulder Flexion  140 Degrees    Right Shoulder ABduction  126 Degrees    Right Shoulder Internal Rotation  73 Degrees    Right Shoulder External Rotation  92 Degrees    Left Shoulder Extension  39 Degrees    Left Shoulder Flexion  145 Degrees    Left Shoulder ABduction  103 Degrees tight in axilla    Left Shoulder Internal Rotation  46 Degrees    Left Shoulder External Rotation  90 Degrees                   [facility] Adult PT Treatment/Exercise - 07/24/17 0001      Shoulder Exercises: Pulleys   Flexion  5 minutes    Flexion Limitations  Verbal cues to work in painfree ROM    ABduction  2 minutes    ABduction Limitations  Verbal cues to work in painfree ROM      Shoulder Exercises: Therapy Ball   Flexion  Both;10 reps With forward lean into end of stretch    ABduction  5 reps;Both    ABduction Limitations  PT demo and verbal cues for proper technique to prevent scaplar compensation      Manual Therapy   Manual Therapy  Passive ROM;Neural Stretch    Passive ROM  PROM to bilateral shoulders in supine focused on flexion and abduction. All PROM to pt tolerance. She reported feeling relief in her arms with PROM. PROM performed very slowly and gently,    Neural Stretch  Passive neural stretch to rt UE in supine to pt tolerance. Cording noted into her elbow.                  PT Long Term Goals - 07/03/17 0930      PT LONG TERM GOAL #1   Title  Patient will demonstrate she has returned to baseline related to shoulder ROM and function.    Time  6    Period  Weeks    Status  New      PT LONG TERM GOAL #2   Title  Patient will increase bilateral shoulder flexion AROM to >/= 140 degrees for increased ease reaching overhead.    Time  6    Period  Weeks    Status  New      PT LONG TERM GOAL #3   Title  Patient will increase bilateral shoulder abduction AROM to >/= 140 degrees for increased ease reaching overhead.    Time  6    Period  Weeks    Status  New       PT LONG TERM GOAL #4   Title  Patient will report she has been able to return to >/= 75% of normal daily tasks.    Time  6    Period  Weeks  Status  New      PT LONG TERM GOAL #5   Title  Patient is able to verbalize understanding of where and how to be fitted for a compression sleeve for flying and also risk reduction practices for lymphedema.    Time  6    Period  Weeks    Status  New      Breast Clinic Goals - 05/15/17 0803      Patient will be able to verbalize understanding of pertinent lymphedema risk reduction practices relevant to her diagnosis specifically related to skin care.   Time  1    Period  Days    Status  Achieved      Patient will be able to return demonstrate and/or verbalize understanding of the post-op home exercise program related to regaining shoulder range of motion.   Time  1    Period  Days    Status  Achieved      Patient will be able to verbalize understanding of the importance of attending the postoperative After Breast Cancer Class for further lymphedema risk reduction education and therapeutic exercise.   Time  1    Period  Days    Status  Achieved           Plan - 07/24/17 1156    Clinical Impression Statement  Pt continued to tolerate stretching very well today with increased end ROM noted by pt and therapist by end of session. Her A/ROM measurements have improved greatly since last time measured. Pt got pulleys at home after last visit and has been using them daily as well reporting that stretching has been feeling good and she is noticing improvement with ADLs as well due to increased range.     Rehab Potential  Excellent    Clinical Impairments Affecting Rehab Potential  Recent surgery with extensive axillary dissection    PT Frequency  2x / week    PT Duration  4 weeks    PT Treatment/Interventions  ADLs/Self Care Home Management;Therapeutic activities;Therapeutic exercise;Patient/family education;Manual techniques;Manual lymph  drainage;Scar mobilization;Passive range of motion    PT Next Visit Plan  Continue pulleys, ROM exercises, postural exercises, PROM bil shoulders    Consulted and Agree with Plan of Care  Patient       Patient will benefit from skilled therapeutic intervention in order to improve the following deficits and impairments:  Pain, Decreased scar mobility, Decreased range of motion, Impaired UE functional use, Postural dysfunction, Decreased knowledge of precautions, Increased fascial restricitons, Decreased activity tolerance, Decreased knowledge of use of DME, Increased edema  Visit Diagnosis: Abnormal posture  Aftercare following surgery for neoplasm  Stiffness of left shoulder, not elsewhere classified  Stiffness of right shoulder, not elsewhere classified  Pain in left upper arm  Pain in right upper arm     Problem List Patient Active Problem List   Diagnosis Date Noted  . Breast cancer metastasized to axillary lymph node, right ([place]) 06/05/2017  . Genetic testing 05/27/2017  . Malignant neoplasm of axillary tail of right breast in female, estrogen receptor positive ([place]) 05/09/2017  . Cubital tunnel syndrome on left 10/02/2015  . Trigger finger, left index finger 04/12/2015  . Erroneous encounter - disregard 09/22/2014  . Diabetes type 1, controlled ([place]) 01/12/2013    Otelia Limes, PTA 07/24/2017, 12:05 PM  [place], [place], [address] Phone: [phone]   Fax:  [phone]  Name: Stephanie Holden MRN: [id number]  Date of Birth: [birth year]

## 2017-07-24 NOTE — Patient Instructions (Signed)
Pegfilgrastim injection What is this medicine? PEGFILGRASTIM (PEG fil gra stim) is a long-acting granulocyte colony-stimulating factor that stimulates the growth of neutrophils, a type of white blood cell important in the body's fight against infection. It is used to reduce the incidence of fever and infection in patients with certain types of cancer who are receiving chemotherapy that affects the bone marrow, and to increase survival after being exposed to high doses of radiation. This medicine may be used for other purposes; ask your health care provider or pharmacist if you have questions. COMMON BRAND NAME(S): Neulasta What should I tell my health care provider before I take this medicine? They need to know if you have any of these conditions: -kidney disease -latex allergy -ongoing radiation therapy -sickle cell disease -skin reactions to acrylic adhesives (On-Body Injector only) -an unusual or allergic reaction to pegfilgrastim, filgrastim, other medicines, foods, dyes, or preservatives -pregnant or trying to get pregnant -breast-feeding How should I use this medicine? This medicine is for injection under the skin. If you get this medicine at home, you will be taught how to prepare and give the pre-filled syringe or how to use the On-body Injector. Refer to the patient Instructions for Use for detailed instructions. Use exactly as directed. Tell your healthcare provider immediately if you suspect that the On-body Injector may not have performed as intended or if you suspect the use of the On-body Injector resulted in a missed or partial dose. It is important that you put your used needles and syringes in a special sharps container. Do not put them in a trash can. If you do not have a sharps container, call your pharmacist or healthcare provider to get one. Talk to your pediatrician regarding the use of this medicine in children. While this drug may be prescribed for selected conditions,  precautions do apply. Overdosage: If you think you have taken too much of this medicine contact a poison control center or emergency room at once. NOTE: This medicine is only for you. Do not share this medicine with others. What if I miss a dose? It is important not to miss your dose. Call your doctor or health care professional if you miss your dose. If you miss a dose due to an On-body Injector failure or leakage, a new dose should be administered as soon as possible using a single prefilled syringe for manual use. What may interact with this medicine? Interactions have not been studied. Give your health care provider a list of all the medicines, herbs, non-prescription drugs, or dietary supplements you use. Also tell them if you smoke, drink alcohol, or use illegal drugs. Some items may interact with your medicine. This list may not describe all possible interactions. Give your health care provider a list of all the medicines, herbs, non-prescription drugs, or dietary supplements you use. Also tell them if you smoke, drink alcohol, or use illegal drugs. Some items may interact with your medicine. What should I watch for while using this medicine? You may need blood work done while you are taking this medicine. If you are going to need a MRI, CT scan, or other procedure, tell your doctor that you are using this medicine (On-Body Injector only). What side effects may I notice from receiving this medicine? Side effects that you should report to your doctor or health care professional as soon as possible: -allergic reactions like skin rash, itching or hives, swelling of the face, lips, or tongue -dizziness -fever -pain, redness, or irritation at site   where injected -pinpoint red spots on the skin -red or dark-brown urine -shortness of breath or breathing problems -stomach or side pain, or pain at the shoulder -swelling -tiredness -trouble passing urine or change in the amount of urine Side  effects that usually do not require medical attention (report to your doctor or health care professional if they continue or are bothersome): -bone pain -muscle pain This list may not describe all possible side effects. Call your doctor for medical advice about side effects. You may report side effects to FDA at 1-800-FDA-1088. Where should I keep my medicine? Keep out of the reach of children. Store pre-filled syringes in a refrigerator between 2 and 8 degrees C (36 and 46 degrees F). Do not freeze. Keep in carton to protect from light. Throw away this medicine if it is left out of the refrigerator for more than 48 hours. Throw away any unused medicine after the expiration date. NOTE: This sheet is a summary. It may not cover all possible information. If you have questions about this medicine, talk to your doctor, pharmacist, or health care provider.  2018 Elsevier/Gold Standard (2016-03-07 12:58:03)  

## 2017-07-25 ENCOUNTER — Telehealth: Payer: Self-pay | Admitting: Certified Nurse Midwife

## 2017-07-25 NOTE — Telephone Encounter (Signed)
Patient pressed cancel at her reminder call for her vitamin D recheck on 07/29/17. I called and left her a message to call back and reschedule. Routing to provider for FYI.

## 2017-07-28 ENCOUNTER — Ambulatory Visit: Payer: BLUE CROSS/BLUE SHIELD | Admitting: Physical Therapy

## 2017-07-28 ENCOUNTER — Encounter: Payer: Self-pay | Admitting: Physical Therapy

## 2017-07-28 DIAGNOSIS — M25611 Stiffness of right shoulder, not elsewhere classified: Secondary | ICD-10-CM

## 2017-07-28 DIAGNOSIS — Z170 Estrogen receptor positive status [ER+]: Secondary | ICD-10-CM

## 2017-07-28 DIAGNOSIS — R293 Abnormal posture: Secondary | ICD-10-CM

## 2017-07-28 DIAGNOSIS — M79622 Pain in left upper arm: Secondary | ICD-10-CM | POA: Diagnosis not present

## 2017-07-28 DIAGNOSIS — M25612 Stiffness of left shoulder, not elsewhere classified: Secondary | ICD-10-CM

## 2017-07-28 DIAGNOSIS — M79621 Pain in right upper arm: Secondary | ICD-10-CM | POA: Diagnosis not present

## 2017-07-28 DIAGNOSIS — C50611 Malignant neoplasm of axillary tail of right female breast: Secondary | ICD-10-CM

## 2017-07-28 DIAGNOSIS — Z483 Aftercare following surgery for neoplasm: Secondary | ICD-10-CM

## 2017-07-28 NOTE — Therapy (Signed)
[place], [place], [address] Phone: [phone]   Fax:  [phone]  Physical Therapy Treatment  Patient Details  Name: Stephanie Holden MRN: [id number] Date of Birth: 12/08/1975 Referring Provider: Dr. [name]   Encounter Date: 07/28/2017  PT End of Session - 07/28/17 1203    Visit Number  5    Number of Visits  12    Date for PT Re-Evaluation  08/14/17    PT Start Time  1101    PT Stop Time  1155    PT Time Calculation (min)  54 min    Activity Tolerance  Patient tolerated treatment well    Behavior During Therapy  [facility] for tasks assessed/performed       Past Medical History:  Diagnosis Date  . Cancer [facility])    Breast cancer - right  . Constipation   . Depression   . Genetic testing 05/27/2017   Breast/GYN panel (23 genes) @ [facility] - No pathogenic mutations detected  . GERD (gastroesophageal reflux disease)   . Insulin dependent type 1 diabetes mellitus ([place])   . Insulin pump in place   . PONV (postoperative nausea and vomiting)   . Stenosing tenosynovitis of thumb 06/2014   right  . Thyroid goiter     Past Surgical History:  Procedure Laterality Date  . AXILLARY LYMPH NODE DISSECTION Right 06/26/2017   Procedure: AXILLARY LYMPH NODE DISSECTION;  Surgeon: [name], MD;  Location: [place];  Service: General;  Laterality: Right;  . CARPAL TUNNEL RELEASE Bilateral 10/28/2013   Procedure: BILATERAL CARPAL TUNNEL RELEASE;  Surgeon: Daryll Brod, MD;  Location: [place];  Service: Orthopedics;  Laterality: Bilateral;  . CESAREAN SECTION  2000; 08/20/2001  . ESSURE TUBAL LIGATION  2005   failed, tubal puncture  . LAPAROSCOPIC TUBAL LIGATION  04/02/2007   removal Essure  . LAPAROSCOPIC UNILATERAL SALPINGECTOMY  2005  . MASTECTOMY W/ SENTINEL NODE BIOPSY Bilateral 06/05/2017   Procedure: BILATERAL MASTECTOMIES  WITH RIGHT SENTINEL LYMPH NODE BIOPSY;  Surgeon: [name], MD;   Location: [place];  Service: General;  Laterality: Bilateral;  . PORTACATH PLACEMENT Left 06/26/2017   Procedure: INSERTION PORT-A-CATH;  Surgeon: [name], MD;  Location: [place];  Service: General;  Laterality: Left;  . TRIGGER FINGER RELEASE Left 04/12/2014   Procedure: RELEASE A-1 PULLEY LEFT THUMB;  Surgeon: Daryll Brod, MD;  Location: [place];  Service: Orthopedics;  Laterality: Left;  . TRIGGER FINGER RELEASE Right 07/19/2014   Procedure: RELEASE TRIGGER FINGER/A-1 PULLEY RIGHT THUMB;  Surgeon: Daryll Brod, MD;  Location: [place];  Service: Orthopedics;  Laterality: Right;    There were no vitals filed for this visit.  Subjective Assessment - 07/28/17 1105    Subjective  I have noticed I'm not having to compensate as much with my arms.    Pertinent History  Patient was diagnosed on 05/08/17 with right invasive ductal carcinoma breast cancer. She was seen in 5/18 and found to have a right axillary mass but did not f/u for further testing. She then had a mammogram in 2/19 and was diagnosed with right breast cancer which had metastasized to the axillary lymph nodes. She had multiple small masses in the lower outer quadrant of her right breast with the largest mass measuring 1.3 cm. It is ER/PR positive and HER2 negative with a Ki67 of 2%. Patient underwent a bilateral mastectomy with right targeted node dissection with 4/7 positive  on 06/05/17 for right invasive ductal carcinoma. She then underwent a right axillary node dissection on 06/26/17 with 2/20 positive nodes. She also had a port-a-cath placed on 06/26/17. She currently has 3 drains in place and will see her surgeon on 07/17/17. The plan is chemotherapy, radiation, and anti-estrogen therapy.     Patient Stated Goals  Try to get arm moving and reduce lymphedema risk    Currently in Pain?  No/denies               [facility] Adult PT Treatment/Exercise - 07/28/17 0001      Exercises   Exercises  Shoulder       Shoulder Exercises: Supine   Other Supine Exercises  Laying supine on foam roll with shoulders in ~ 70 degrees abduction; pt doing diaghramatic breathing     Other Supine Exercises  Laying supine AAROM bil flexion with holding 2# weight to give overpressure for stretch - x6      Shoulder Exercises: Standing   Other Standing Exercises  Standing with back against wall trying to hold shoulders against wall for erect posture. Pt trying to do snow angels on wall into abduction.      Shoulder Exercises: Pulleys   Flexion  3 minutes    ABduction  3 minutes    ABduction Limitations  Verbal cues to not compensate with trunk sidebending      Shoulder Exercises: Therapy Ball   Flexion  Both;10 reps With forward lean into end of stretch    ABduction  5 reps;Both    ABduction Limitations  PT demo and verbal cues for proper technique to prevent scaplar compensation      Manual Therapy   Manual Therapy  Passive ROM;Neural Stretch    Manual therapy comments  Also issued compression foam for her to place between lateral trunk and bra on left side to reduce swelling.    Passive ROM  PROM to bilateral shoulders in supine focused on flexion and abduction. All PROM to pt tolerance. She reported feeling relief in her arms with PROM. PROM performed to her tolerance.    Neural Stretch  Passive neural stretch to rt UE in supine to pt tolerance. Cording noted into her elbow.             PT Education - 07/28/17 1151    Education provided  Yes    Education Details  Scar massage on left chest incision     Person(s) Educated  Patient    Methods  Explanation    Comprehension  Verbalized understanding          PT Long Term Goals - 07/03/17 0930      PT LONG TERM GOAL #1   Title  Patient will demonstrate she has returned to baseline related to shoulder ROM and function.    Time  6    Period  Weeks    Status  New      PT LONG TERM GOAL #2   Title  Patient will increase bilateral shoulder flexion  AROM to >/= 140 degrees for increased ease reaching overhead.    Time  6    Period  Weeks    Status  New      PT LONG TERM GOAL #3   Title  Patient will increase bilateral shoulder abduction AROM to >/= 140 degrees for increased ease reaching overhead.    Time  6    Period  Weeks    Status  New  PT LONG TERM GOAL #4   Title  Patient will report she has been able to return to >/= 75% of normal daily tasks.    Time  6    Period  Weeks    Status  New      PT LONG TERM GOAL #5   Title  Patient is able to verbalize understanding of where and how to be fitted for a compression sleeve for flying and also risk reduction practices for lymphedema.    Time  6    Period  Weeks    Status  New      Breast Clinic Goals - 05/15/17 0803      Patient will be able to verbalize understanding of pertinent lymphedema risk reduction practices relevant to her diagnosis specifically related to skin care.   Time  1    Period  Days    Status  Achieved      Patient will be able to return demonstrate and/or verbalize understanding of the post-op home exercise program related to regaining shoulder range of motion.   Time  1    Period  Days    Status  Achieved      Patient will be able to verbalize understanding of the importance of attending the postoperative After Breast Cancer Class for further lymphedema risk reduction education and therapeutic exercise.   Time  1    Period  Days    Status  Achieved           Plan - 07/28/17 1151    Clinical Impression Statement  Patient with noteably increased ROM today bilaterally. Fluid present superior to incisions on chest bilaterally with left being worse than right. Photo taken today for comparison since eval. She will benefit from scar massage and manual lymph drainage now to reduce swelling.    Rehab Potential  Excellent    Clinical Impairments Affecting Rehab Potential  Recent surgery with extensive axillary dissection    PT Frequency  2x / week     PT Duration  4 weeks    PT Treatment/Interventions  ADLs/Self Care Home Management;Therapeutic activities;Therapeutic exercise;Patient/family education;Manual techniques;Manual lymph drainage;Scar mobilization;Passive range of motion    PT Next Visit Plan  Begin left chest scar massage to try to mobilize fluid present just superior to incision. Continue ROM and PROM    PT Home Exercise Plan  Continue with previously given HEP    Consulted and Agree with Plan of Care  Patient       Patient will benefit from skilled therapeutic intervention in order to improve the following deficits and impairments:  Pain, Decreased scar mobility, Decreased range of motion, Impaired UE functional use, Postural dysfunction, Decreased knowledge of precautions, Increased fascial restricitons, Decreased activity tolerance, Decreased knowledge of use of DME, Increased edema  Visit Diagnosis: Abnormal posture  Aftercare following surgery for neoplasm  Stiffness of left shoulder, not elsewhere classified  Stiffness of right shoulder, not elsewhere classified  Pain in left upper arm  Pain in right upper arm  Malignant neoplasm of axillary tail of right breast in female, estrogen receptor positive ([place])     Problem List Patient Active Problem List   Diagnosis Date Noted  . Breast cancer metastasized to axillary lymph node, right ([place]) 06/05/2017  . Genetic testing 05/27/2017  . Malignant neoplasm of axillary tail of right breast in female, estrogen receptor positive ([place]) 05/09/2017  . Cubital tunnel syndrome on left 10/02/2015  . Trigger finger, left index finger 04/12/2015  .  Erroneous encounter - disregard 09/22/2014  . Diabetes type 1, controlled ([place]) 01/12/2013   Annia Friendly, PT 07/28/17 12:13 PM  [place], [place], [address] Phone: [phone]   Fax:  [phone]  Name: SMT LOKEY MRN:  [id number] Date of Birth: 05/20/1975

## 2017-07-29 ENCOUNTER — Encounter: Payer: Self-pay | Admitting: *Deleted

## 2017-07-29 ENCOUNTER — Inpatient Hospital Stay (HOSPITAL_BASED_OUTPATIENT_CLINIC_OR_DEPARTMENT_OTHER): Payer: BLUE CROSS/BLUE SHIELD | Admitting: Hematology and Oncology

## 2017-07-29 ENCOUNTER — Inpatient Hospital Stay: Payer: BLUE CROSS/BLUE SHIELD

## 2017-07-29 ENCOUNTER — Other Ambulatory Visit: Payer: BLUE CROSS/BLUE SHIELD

## 2017-07-29 DIAGNOSIS — C773 Secondary and unspecified malignant neoplasm of axilla and upper limb lymph nodes: Principal | ICD-10-CM

## 2017-07-29 DIAGNOSIS — C50911 Malignant neoplasm of unspecified site of right female breast: Secondary | ICD-10-CM

## 2017-07-29 DIAGNOSIS — Z170 Estrogen receptor positive status [ER+]: Secondary | ICD-10-CM

## 2017-07-29 DIAGNOSIS — Z95828 Presence of other vascular implants and grafts: Secondary | ICD-10-CM

## 2017-07-29 DIAGNOSIS — Z794 Long term (current) use of insulin: Secondary | ICD-10-CM | POA: Diagnosis not present

## 2017-07-29 DIAGNOSIS — E119 Type 2 diabetes mellitus without complications: Secondary | ICD-10-CM

## 2017-07-29 DIAGNOSIS — C50611 Malignant neoplasm of axillary tail of right female breast: Secondary | ICD-10-CM

## 2017-07-29 DIAGNOSIS — Z5111 Encounter for antineoplastic chemotherapy: Secondary | ICD-10-CM | POA: Diagnosis not present

## 2017-07-29 DIAGNOSIS — Z5189 Encounter for other specified aftercare: Secondary | ICD-10-CM | POA: Diagnosis not present

## 2017-07-29 LAB — CMP (CANCER CENTER ONLY)
ALBUMIN: 3.2 g/dL — AB (ref 3.5–5.0)
ALK PHOS: 89 U/L (ref 40–150)
ALT: 11 U/L (ref 0–55)
ANION GAP: 5 (ref 3–11)
AST: 11 U/L (ref 5–34)
BUN: 12 mg/dL (ref 7–26)
CALCIUM: 8.4 mg/dL (ref 8.4–10.4)
CHLORIDE: 104 mmol/L (ref 98–109)
CO2: 29 mmol/L (ref 22–29)
Creatinine: 0.75 mg/dL (ref 0.60–1.10)
GFR, Est AFR Am: >60 mL/min (ref >60)
GFR, Estimated: >60 mL/min (ref >60)
GLUCOSE: 115 mg/dL (ref 70–140)
POTASSIUM: 4.1 mmol/L (ref 3.5–5.1)
SODIUM: 138 mmol/L (ref 136–145)
Total Bilirubin: 0.3 mg/dL (ref 0.2–1.2)
Total Protein: 6.3 g/dL — ABNORMAL LOW (ref 6.4–8.3)

## 2017-07-29 LAB — CBC WITH DIFFERENTIAL (CANCER CENTER ONLY)
Basophils Absolute: 0 10*3/uL (ref 0.0–0.1)
Basophils Relative: 3 %
EOS ABS: 0.2 10*3/uL (ref 0.0–0.5)
Eosinophils Relative: 12 %
HCT: 34.4 % — ABNORMAL LOW (ref 34.8–46.6)
HEMOGLOBIN: 11.1 g/dL — AB (ref 11.6–15.9)
LYMPHS ABS: 0.9 10*3/uL (ref 0.9–3.3)
Lymphocytes Relative: 70 %
MCH: 26.8 pg (ref 25.1–34.0)
MCHC: 32.3 g/dL (ref 31.5–36.0)
MCV: 83.1 fL (ref 79.5–101.0)
MONO ABS: 0.1 10*3/uL (ref 0.1–0.9)
MONOS PCT: 7 %
NEUTROS PCT: 8 %
Neutro Abs: 0.1 10*3/uL — CL (ref 1.5–6.5)
Platelet Count: 130 10*3/uL — ABNORMAL LOW (ref 145–400)
RBC: 4.14 MIL/uL (ref 3.70–5.45)
RDW: 13.7 % (ref 11.2–14.5)
WBC Count: 1.2 10*3/uL — ABNORMAL LOW (ref 3.9–10.3)

## 2017-07-29 MED ORDER — SODIUM CHLORIDE 0.9% FLUSH
10.0000 mL | INTRAVENOUS | Status: DC | PRN
  Administered 2017-07-29: 10 mL via INTRAVENOUS
  Filled 2017-07-29: qty 10

## 2017-07-29 MED ORDER — HEPARIN SOD (PORK) LOCK FLUSH 100 UNIT/ML IV SOLN
500.0000 [IU] | Freq: Once | INTRAVENOUS | Status: AC
  Administered 2017-07-29: 500 [IU] via INTRAVENOUS
  Filled 2017-07-29: qty 5

## 2017-07-29 NOTE — Patient Instructions (Signed)

## 2017-07-29 NOTE — Assessment & Plan Note (Signed)
06/05/2017:Bilateral mastectomies: Right mastectomy: IDC grade 1, 3 foci 1.2 cm, 1.2 cm, 1 cm, IgA DCIS, LVH diffuse present, margins negative, 4/7 lymph nodes positive with extracapsular extension, ER 95%, PR 95%, HER-2 negative, Ki-67 2% to 10%, T1 cN2 aM0 stage II a pathological stage; left mastectomy: Benign  Mammaprint done preoperatively: Low risk Axillary lymph node dissection 06/26/2017: 2/22 lymph nodes positive (making a total of 6 lymph nodes that were positive)  Treatment plan: 1. Because of her high risk features (6 LN Pos), we recommended adjuvant systemic chemotherapy with dose dense Adriamycin and Cytoxan x4 followed by Taxol weekly x12 started 07/22/2017 3.Followed by radiation.  4.Followed by adjuvant antiestrogen therapy  UPBEAT clinical trial ([id number]): No toxicities related to the trial --------------------------------------------------------------------------- Current treatment: Cycle 1 day 8 dose dense Adriamycin and Cytoxan Labs reviewed Echocardiogram 06/20/2017: EF 55 to 60%  Chemo toxicities:  Diabetes mellitus: Patient has an insulin pump  Return to clinic in 1 week for cycle 2

## 2017-07-29 NOTE — Progress Notes (Signed)
Patient Care Team: Tisovec, Fransico Him, MD as PCP - General (Internal Medicine)  DIAGNOSIS:  Encounter Diagnosis  Name Primary?  . Malignant neoplasm of axillary tail of right breast in female, estrogen receptor positive ([place])     SUMMARY OF ONCOLOGIC HISTORY:   Malignant neoplasm of axillary tail of right breast in female, estrogen receptor positive ([place])   05/09/2017 Initial Diagnosis    May 2018: Palpable right axillary mass.  The recommended a 6-week follow-up but apparently the mass decreased in size.  Recent increase in the mass was noted, biopsy revealed Grade 1 IDC with DCIS, lymphovascular invasion present, 4 small hypoechoic masses largest 0.9 cm, T1BN1 stage Ib clinical stage      06/05/2017 Surgery    Bilateral mastectomies: Right mastectomy: IDC grade 1, 3 foci 1.2 cm, 1.2 cm, 1 cm, IgA DCIS, LVH diffuse present, margins negative, 4/7 lymph nodes positive with extracapsular extension, ER 95%, PR 95%, HER-2 negative, Ki-67 2% to 10%, T1 cN2 aM0 stage II a pathological stage; left mastectomy: Benign      06/26/2017 Surgery    Axillary lymph node dissection: 2/22 lymph nodes positive (making a total of 6+ lymph nodes)      07/22/2017 -  Chemotherapy    Adjuvant chemotherapy with dose dense Adriamycin and Cytoxan x4 followed by Taxol weekly x12        Breast cancer metastasized to axillary lymph node, right ([place])   06/05/2017 Initial Diagnosis    Breast cancer metastasized to axillary lymph node, right ([place])      07/22/2017 -  Chemotherapy    Adjuvant chemotherapy with dose dense Adriamycin and Cytoxan x4 followed by Taxol weekly x12        CHIEF COMPLIANT: Cycle 1 day 8 right breast cancer adjuvant chemo  INTERVAL HISTORY: Stephanie [place] is a 4-year with above-mentioned history of bilateral mastectomies for right breast cancer who is currently on adjuvant chemotherapy.  Today cycle 1 day 8 of dose dense Adriamycin and Cytoxan.  She appears to have tolerated it  reasonably well.  Her biggest issue is been diabetes and high blood sugars.  This probably related to dexamethasone.  REVIEW OF SYSTEMS:   Constitutional: Denies fevers, chills or abnormal weight loss Eyes: Denies blurriness of vision Ears, nose, mouth, throat, and face: Denies mucositis or sore throat Respiratory: Denies cough, dyspnea or wheezes Cardiovascular: Denies palpitation, chest discomfort Gastrointestinal:  Denies nausea, heartburn or change in bowel habits Skin: Denies abnormal skin rashes Lymphatics: Denies new lymphadenopathy or easy bruising Neurological:Denies numbness, tingling or new weaknesses Behavioral/Psych: Mood is stable, no new changes  Extremities: No lower extremity edema Breast: Bilateral mastectomies  all other systems were reviewed with the patient and are negative.  I have reviewed the past medical history, past surgical history, social history and family history with the patient and they are unchanged from previous note.  ALLERGIES:  is allergic to dilaudid [hydromorphone hcl].  MEDICATIONS:  Current Outpatient Medications  Medication Sig Dispense Refill  . acetaminophen (TYLENOL) 500 MG tablet Take 1,000 mg by mouth every 6 (six) hours as needed for moderate pain or headache.    . dexamethasone (DECADRON) 2 MG tablet Take 1 tablet day after chemotherapy and 1 tablet 2 days after with food. (Patient not taking: Reported on 07/09/2017) 10 tablet 0  . gabapentin (NEURONTIN) 300 MG capsule Take 1 capsule (300 mg total) by mouth at bedtime. 30 capsule 0  . insulin aspart (NOVOLOG) 100 UNIT/ML injection Inject 45 Units  into the skin See admin instructions. Use via insulin pump up to 45 units daily    . lidocaine-prilocaine (EMLA) cream Apply to affected area once (Patient not taking: Reported on 07/09/2017) 30 g 3  . LORazepam (ATIVAN) 0.5 MG tablet Take 1 tablet (0.5 mg total) by mouth every 6 (six) hours as needed for anxiety or sleep. 30 tablet 3  .  methocarbamol (ROBAXIN) 500 MG tablet Take 1 tablet (500 mg total) by mouth every 8 (eight) hours as needed for muscle spasms. 30 tablet 1  . ondansetron (ZOFRAN) 8 MG tablet Take 1 tablet (8 mg total) by mouth 2 (two) times daily as needed. Start on the third day after chemotherapy. (Patient not taking: Reported on 07/09/2017) 30 tablet 1  . Probiotic Product (PROBIOTIC DAILY PO) Take 1 capsule by mouth at bedtime.     . prochlorperazine (COMPAZINE) 10 MG tablet Take 1 tablet (10 mg total) by mouth every 6 (six) hours as needed (Nausea or vomiting). (Patient not taking: Reported on 07/09/2017) 30 tablet 1  . tamoxifen (NOLVADEX) 20 MG tablet TAKE 1 TABLET BY MOUTH EVERY DAY 30 tablet 0  . venlafaxine XR (EFFEXOR-XR) 37.5 MG 24 hr capsule Take 1 capsule (37.5 mg total) by mouth daily with breakfast. (Patient taking differently: Take 37.5 mg by mouth at bedtime. ) 30 capsule 3   No current facility-administered medications for this visit.     PHYSICAL EXAMINATION: ECOG PERFORMANCE STATUS: 1 - Symptomatic but completely ambulatory  Vitals:   07/29/17 1032  BP: 139/83  Pulse: 79  Resp: 18  Temp: 98.9 F (37.2 C)  SpO2: 100%   Filed Weights   07/29/17 1032  Weight: 166 lb 3.2 oz (75.4 kg)    GENERAL:alert, no distress and comfortable SKIN: skin color, texture, turgor are normal, no rashes or significant lesions EYES: normal, Conjunctiva are pink and non-injected, sclera clear OROPHARYNX:no exudate, no erythema and lips, buccal mucosa, and tongue normal  NECK: supple, thyroid normal size, non-tender, without nodularity LYMPH:  no palpable lymphadenopathy in the cervical, axillary or inguinal LUNGS: clear to auscultation and percussion with normal breathing effort HEART: regular rate & rhythm and no murmurs and no lower extremity edema ABDOMEN:abdomen soft, non-tender and normal bowel sounds MUSCULOSKELETAL:no cyanosis of digits and no clubbing  NEURO: alert & oriented x 3 with fluent  speech, no focal motor/sensory deficits EXTREMITIES: No lower extremity edema  LABORATORY DATA:  I have reviewed the data as listed CMP Latest Ref Rng & Units 07/22/2017 07/09/2017 06/27/2017  Glucose 70 - 140 mg/dL 218(H) 126 151(H)  BUN 7 - 26 mg/dL '12 14 6  ' Creatinine 0.60 - 1.10 mg/dL 0.88 0.82 0.73  Sodium 136 - 145 mmol/L 139 139 135  Potassium 3.5 - 5.1 mmol/L 3.8 4.0 4.0  Chloride 98 - 109 mmol/L 103 108 105  CO2 22 - 29 mmol/L '28 27 23  ' Calcium 8.4 - 10.4 mg/dL 8.9 8.9 8.5(L)  Total Protein 6.4 - 8.3 g/dL 7.3 6.8 -  Total Bilirubin 0.2 - 1.2 mg/dL 0.4 0.3 -  Alkaline Phos 40 - 150 U/L 78 68 -  AST 5 - 34 U/L 17 12 -  ALT 0 - 55 U/L 13 9 -    Lab Results  Component Value Date   WBC 1.2 (L) 07/29/2017   HGB 11.1 (L) 07/29/2017   HCT 34.4 (L) 07/29/2017   MCV 83.1 07/29/2017   PLT 130 (L) 07/29/2017   NEUTROABS 0.1 (LL) 07/29/2017    ASSESSMENT &  PLAN:  Malignant neoplasm of axillary tail of right breast in female, estrogen receptor positive ([place]) 06/05/2017:Bilateral mastectomies: Right mastectomy: IDC grade 1, 3 foci 1.2 cm, 1.2 cm, 1 cm, IgA DCIS, LVH diffuse present, margins negative, 4/7 lymph nodes positive with extracapsular extension, ER 95%, PR 95%, HER-2 negative, Ki-67 2% to 10%, T1 cN2 aM0 stage II a pathological stage; left mastectomy: Benign  Mammaprint done preoperatively: Low risk Axillary lymph node dissection 06/26/2017: 2/22 lymph nodes positive (making a total of 6 lymph nodes that were positive)  Treatment plan: 1. Because of her high risk features (6 LN Pos), we recommended adjuvant systemic chemotherapy with dose dense Adriamycin and Cytoxan x4 followed by Taxol weekly x12 started 07/22/2017 3.Followed by radiation.  4.Followed by adjuvant antiestrogen therapy  UPBEAT clinical trial ([id number]): No toxicities related to the trial --------------------------------------------------------------------------- Current treatment: Cycle 1 day 8 dose  dense Adriamycin and Cytoxan Labs reviewed Echocardiogram 06/20/2017: EF 55 to 60%  Chemo toxicities: 1.  ANC 0.1: I gave her neutropenic precautions.  We will reduce the dosage of cycle 2 of her chemotherapy. Since she did not have any nausea issues: I will discontinue further dexamethasone.  Patient does not want to bear any wig  Diabetes mellitus: Patient has an insulin pump  Return to clinic in 1 week for cycle 2  No orders of the defined types were placed in this encounter.  The patient has a good understanding of the overall plan. she agrees with it. she will call with any problems that may develop before the next visit here.   Harriette Ohara, MD 07/29/17

## 2017-07-31 ENCOUNTER — Encounter: Payer: Self-pay | Admitting: Physical Therapy

## 2017-07-31 ENCOUNTER — Telehealth: Payer: Self-pay | Admitting: *Deleted

## 2017-07-31 ENCOUNTER — Ambulatory Visit: Payer: BLUE CROSS/BLUE SHIELD | Admitting: Physical Therapy

## 2017-07-31 ENCOUNTER — Other Ambulatory Visit: Payer: Self-pay

## 2017-07-31 ENCOUNTER — Other Ambulatory Visit: Payer: Self-pay | Admitting: *Deleted

## 2017-07-31 DIAGNOSIS — Z483 Aftercare following surgery for neoplasm: Secondary | ICD-10-CM | POA: Diagnosis not present

## 2017-07-31 DIAGNOSIS — M25611 Stiffness of right shoulder, not elsewhere classified: Secondary | ICD-10-CM | POA: Diagnosis not present

## 2017-07-31 DIAGNOSIS — M79622 Pain in left upper arm: Secondary | ICD-10-CM | POA: Diagnosis not present

## 2017-07-31 DIAGNOSIS — R293 Abnormal posture: Secondary | ICD-10-CM | POA: Diagnosis not present

## 2017-07-31 DIAGNOSIS — M25612 Stiffness of left shoulder, not elsewhere classified: Secondary | ICD-10-CM | POA: Diagnosis not present

## 2017-07-31 DIAGNOSIS — Z170 Estrogen receptor positive status [ER+]: Secondary | ICD-10-CM

## 2017-07-31 DIAGNOSIS — C50611 Malignant neoplasm of axillary tail of right female breast: Secondary | ICD-10-CM | POA: Diagnosis not present

## 2017-07-31 DIAGNOSIS — M79621 Pain in right upper arm: Secondary | ICD-10-CM

## 2017-07-31 MED ORDER — MAGIC MOUTHWASH W/LIDOCAINE: 5.0000 mL | Freq: Four times a day (QID) | ORAL | 1 refills | Status: DC | PRN

## 2017-07-31 NOTE — Telephone Encounter (Signed)
This RN called pt she said she spoke with [facility] RN navigator and Dr Lindi Adie ordered the magic mouth wash for " in my mouth - but what about this sore on my lip"  Stephanie Holden states she does not get fever blisters - " but this really looks like one "  This RN discussed above may be a fever blister due to her immunocompromised state allowing for occurrence.  Discussed use of OTC topical creams ( like Abreva ) but will also inquire with MD if antiviral medication would be of benefit.  Call will be returned to the patient upon MD review and recommendation.  Pt is scheduled for follow up 5/14.

## 2017-07-31 NOTE — Telephone Encounter (Signed)
Can you send in magic mouthwash 23ml sw/spit four times a day for this patient and evaluate if she would benefit from Valtrex?  Also ensure proper mouth care, with using biotene, non alcoholic mouth rinses.

## 2017-07-31 NOTE — Telephone Encounter (Signed)
Voicemail received.  "Started chemotherapy last week.  Yesterday sores on my lips and tongue started.  what can I take to alleviate these?  Call back, [phone]."  Message forwarded to collaborative for further patient communication.

## 2017-07-31 NOTE — Therapy (Signed)
[place], [place], [address] Phone: [phone]   Fax:  [phone]  Physical Therapy Treatment  Patient Details  Name: Stephanie Hold[place] MRN: [id number] Date of Birth: [birth year] Referring Provider: Dr. [name]   Encounter Date: 07/31/2017  PT End of Session - 07/31/17 1148    Visit Number  6    Number of Visits  12    Date for PT Re-Evaluation  08/14/17    PT Start Time  1102    PT Stop Time  1155    PT Time Calculation (min)  53 min    Activity Tolerance  Patient tolerated treatment well    Behavior During Therapy  [facility] for tasks assessed/performed       Past Medical History:  Diagnosis Date  . Cancer [facility])    Breast cancer - right  . Constipation   . Depression   . Genetic testing 05/27/2017   Breast/GYN panel (23 genes) @ [facility] - No pathogenic mutations detected  . GERD (gastroesophageal reflux disease)   . Insulin dependent type 1 diabetes mellitus ([place])   . Insulin pump in place   . PONV (postoperative nausea and vomiting)   . Stenosing tenosynovitis of thumb 06/2014   right  . Thyroid goiter     Past Surgical History:  Procedure Laterality Date  . AXILLARY LYMPH NODE DISSECTION Right 06/26/2017   Procedure: AXILLARY LYMPH NODE DISSECTION;  Surgeon: [name], MD;  Location: [place];  Service: General;  Laterality: Right;  . CARPAL TUNNEL RELEASE Bilateral 10/28/2013   Procedure: BILATERAL CARPAL TUNNEL RELEASE;  Surgeon: Daryll Brod, MD;  Location: [place];  Service: Orthopedics;  Laterality: Bilateral;  . CESAREAN SECTION  2000; 08/20/2001  . ESSURE TUBAL LIGATION  2005   failed, tubal puncture  . LAPAROSCOPIC TUBAL LIGATION  04/02/2007   removal Essure  . LAPAROSCOPIC UNILATERAL SALPINGECTOMY  2005  . MASTECTOMY W/ SENTINEL NODE BIOPSY Bilateral 06/05/2017   Procedure: BILATERAL MASTECTOMIES  WITH RIGHT SENTINEL LYMPH NODE BIOPSY;  Surgeon: [name], MD;   Location: [place];  Service: General;  Laterality: Bilateral;  . PORTACATH PLACEMENT Left 06/26/2017   Procedure: INSERTION PORT-A-CATH;  Surgeon: [name], MD;  Location: [place];  Service: General;  Laterality: Left;  . TRIGGER FINGER RELEASE Left 04/12/2014   Procedure: RELEASE A-1 PULLEY LEFT THUMB;  Surgeon: Daryll Brod, MD;  Location: [place];  Service: Orthopedics;  Laterality: Left;  . TRIGGER FINGER RELEASE Right 07/19/2014   Procedure: RELEASE TRIGGER FINGER/A-1 PULLEY RIGHT THUMB;  Surgeon: Daryll Brod, MD;  Location: [place];  Service: Orthopedics;  Laterality: Right;    There were no vitals filed for this visit.  Subjective Assessment - 07/31/17 1105    Subjective  Patient came in wearing face mask and reports her white blood cell count is very low from chemo. Reviewed labs in her chart and her platelets are low but still at a safe level for exercising. She reports a headache began the evening of 07/29/17 and reports it is 5/10 described as a dull pain.    Pertinent History  Patient was diagnosed on 05/08/17 with right invasive ductal carcinoma breast cancer. She was seen in 5/18 and found to have a right axillary mass but did not f/u for further testing. She then had a mammogram in 2/19 and was diagnosed with right breast cancer which had metastasized to the axillary lymph nodes. She had multiple small  masses in the lower outer quadrant of her right breast with the largest mass measuring 1.3 cm. It is ER/PR positive and HER2 negative with a Ki67 of 2%. Patient underwent a bilateral mastectomy with right targeted node dissection with 4/7 positive on 06/05/17 for right invasive ductal carcinoma. She then underwent a right axillary node dissection on 06/26/17 with 2/20 positive nodes. She also had a port-a-cath placed on 06/26/17. She currently has 3 drains in place and will see her surgeon on 07/17/17. The plan is chemotherapy, radiation, and anti-estrogen therapy.      Currently in Pain?  Yes    Pain Score  5     Pain Location  Head    Pain Orientation  -- "all around my head"    Pain Descriptors / Indicators  Aching    Pain Onset  In the past 7 days    Pain Frequency  Intermittent                       [facility] Adult PT Treatment/Exercise - 07/31/17 0001      Shoulder Exercises: Supine   Other Supine Exercises  Laying supine bil shoulder AAROM flexion inhaling on way up and then exhale at end ROM to achieve more flexion x5      Shoulder Exercises: Pulleys   Flexion  3 minutes    ABduction  3 minutes    ABduction Limitations  Verbal cues to not compensate with trunk sidebending      Manual Therapy   Manual Therapy  Manual Lymphatic Drainage (MLD)    Manual therapy comments  Manual lymph drainage to left chest to reduce edema: pt supine - short neck, left inguinal nodes, left axillo-inguinal pathways, left chest focused on area inferior to incision site.    Passive ROM  PROM to bilateral shoulders in supine focused on flexion and abduction. All PROM to pt tolerance. She reported feeling relief in her arms with PROM. PROM performed to her tolerance.    Neural Stretch  Passive neural stretch to rt UE in supine to pt tolerance. Cording noted into her elbow.                  PT Long Term Goals - 07/03/17 0930      PT LONG TERM GOAL #1   Title  Patient will demonstrate she has returned to baseline related to shoulder ROM and function.    Time  6    Period  Weeks    Status  New      PT LONG TERM GOAL #2   Title  Patient will increase bilateral shoulder flexion AROM to >/= 140 degrees for increased ease reaching overhead.    Time  6    Period  Weeks    Status  New      PT LONG TERM GOAL #3   Title  Patient will increase bilateral shoulder abduction AROM to >/= 140 degrees for increased ease reaching overhead.    Time  6    Period  Weeks    Status  New      PT LONG TERM GOAL #4   Title  Patient will report she has been  able to return to >/= 75% of normal daily tasks.    Time  6    Period  Weeks    Status  New      PT LONG TERM GOAL #5   Title  Patient is able to verbalize understanding  of where and how to be fitted for a compression sleeve for flying and also risk reduction practices for lymphedema.    Time  6    Period  Weeks    Status  New      Breast Clinic Goals - 05/15/17 0803      Patient will be able to verbalize understanding of pertinent lymphedema risk reduction practices relevant to her diagnosis specifically related to skin care.   Time  1    Period  Days    Status  Achieved      Patient will be able to return demonstrate and/or verbalize understanding of the post-op home exercise program related to regaining shoulder range of motion.   Time  1    Period  Days    Status  Achieved      Patient will be able to verbalize understanding of the importance of attending the postoperative After Breast Cancer Class for further lymphedema risk reduction education and therapeutic exercise.   Time  1    Period  Days    Status  Achieved           Plan - 07/31/17 1146    Clinical Impression Statement  Pt c/o feeling "like my head is empty". BP taken on left arm and it was 150/94. Pt called Iris Pert, RN to inform her while in PT office. Pt reported feeling fine as she was leaving the PT clinic. Chest edema appears to be slightly less and shoulder ROM continues to progress.     Rehab Potential  Excellent    Clinical Impairments Affecting Rehab Potential  Recent surgery with extensive axillary dissection    PT Frequency  2x / week    PT Duration  4 weeks    PT Treatment/Interventions  ADLs/Self Care Home Management;Therapeutic activities;Therapeutic exercise;Patient/family education;Manual techniques;Manual lymph drainage;Scar mobilization;Passive range of motion    PT Next Visit Plan  Continue massage on left chest scar and MLD; PROM to bil shoulders as tolerated.    PT Home Exercise  Plan  Continue with previously given HEP    Consulted and Agree with Plan of Care  Patient       Patient will benefit from skilled therapeutic intervention in order to improve the following deficits and impairments:  Pain, Decreased scar mobility, Decreased range of motion, Impaired UE functional use, Postural dysfunction, Decreased knowledge of precautions, Increased fascial restricitons, Decreased activity tolerance, Decreased knowledge of use of DME, Increased edema  Visit Diagnosis: Abnormal posture  Aftercare following surgery for neoplasm  Stiffness of left shoulder, not elsewhere classified  Stiffness of right shoulder, not elsewhere classified  Pain in left upper arm  Pain in right upper arm  Malignant neoplasm of axillary tail of right breast in female, estrogen receptor positive ([place])     Problem List Patient Active Problem List   Diagnosis Date Noted  . Breast cancer metastasized to axillary lymph node, right ([place]) 06/05/2017  . Genetic testing 05/27/2017  . Malignant neoplasm of axillary tail of right breast in female, estrogen receptor positive ([place]) 05/09/2017  . Cubital tunnel syndrome on left 10/02/2015  . Trigger finger, left index finger 04/12/2015  . Erroneous encounter - disregard 09/22/2014  . Diabetes type 1, controlled ([place]) 01/12/2013   Annia Friendly, PT 07/31/17 12:27 PM  [place], [place], [address] Phone: [phone]   Fax:  [phone]  Name: BETHEL SIROIS MRN: [id number] Date of Birth: 1975/09/02

## 2017-07-31 NOTE — Telephone Encounter (Signed)
Received call from patient stating she has a couple of sores in her mouth and her lip.  Called in magic mouthwash for her. Encourage her to let me know if it gets worse or if this doesn't help.

## 2017-08-01 ENCOUNTER — Other Ambulatory Visit: Payer: Self-pay | Admitting: *Deleted

## 2017-08-01 MED ORDER — VALACYCLOVIR HCL 500 MG PO TABS: 500.0000 mg | ORAL_TABLET | Freq: Two times a day (BID) | ORAL | 3 refills | Status: DC

## 2017-08-01 NOTE — Telephone Encounter (Signed)
Pt called and informed of recommendation with Valtrex therapy.  Stephanie [place] verbalized understanding.  Prescription sent to pt's pharmacy.

## 2017-08-01 NOTE — Telephone Encounter (Signed)
Valtrex 500mg  po bid disp 60 no refills.  Tell her to take until she sees [place] in the office, then [facility] can decide if she needs to continue it.

## 2017-08-04 ENCOUNTER — Ambulatory Visit: Payer: BLUE CROSS/BLUE SHIELD

## 2017-08-04 DIAGNOSIS — C50611 Malignant neoplasm of axillary tail of right female breast: Secondary | ICD-10-CM | POA: Diagnosis not present

## 2017-08-04 DIAGNOSIS — R293 Abnormal posture: Secondary | ICD-10-CM | POA: Diagnosis not present

## 2017-08-04 DIAGNOSIS — M79622 Pain in left upper arm: Secondary | ICD-10-CM | POA: Diagnosis not present

## 2017-08-04 DIAGNOSIS — M25612 Stiffness of left shoulder, not elsewhere classified: Secondary | ICD-10-CM

## 2017-08-04 DIAGNOSIS — Z170 Estrogen receptor positive status [ER+]: Secondary | ICD-10-CM

## 2017-08-04 DIAGNOSIS — Z483 Aftercare following surgery for neoplasm: Secondary | ICD-10-CM

## 2017-08-04 DIAGNOSIS — M79621 Pain in right upper arm: Secondary | ICD-10-CM | POA: Diagnosis not present

## 2017-08-04 DIAGNOSIS — M25611 Stiffness of right shoulder, not elsewhere classified: Secondary | ICD-10-CM

## 2017-08-04 NOTE — Therapy (Signed)
[place], [place], [address] Phone: [phone]   Fax:  [phone]  Physical Therapy Treatment  Patient Details  Name: Stephanie Holden MRN: [id number] Date of Birth: November 17, 1975 Referring Provider: Dr. [name]   Encounter Date: 08/04/2017  PT End of Session - 08/04/17 1019    Visit Number  7    Number of Visits  12    Date for PT Re-Evaluation  08/14/17    PT Start Time  0935    PT Stop Time  1019    PT Time Calculation (min)  44 min    Activity Tolerance  Patient tolerated treatment well    Behavior During Therapy  [facility] for tasks assessed/performed       Past Medical History:  Diagnosis Date  . Cancer [facility])    Breast cancer - right  . Constipation   . Depression   . Genetic testing 05/27/2017   Breast/GYN panel (23 genes) @ [facility] - No pathogenic mutations detected  . GERD (gastroesophageal reflux disease)   . Insulin dependent type 1 diabetes mellitus ([place])   . Insulin pump in place   . PONV (postoperative nausea and vomiting)   . Stenosing tenosynovitis of thumb 06/2014   right  . Thyroid goiter     Past Surgical History:  Procedure Laterality Date  . AXILLARY LYMPH NODE DISSECTION Right 06/26/2017   Procedure: AXILLARY LYMPH NODE DISSECTION;  Surgeon: [name], MD;  Location: [place];  Service: General;  Laterality: Right;  . CARPAL TUNNEL RELEASE Bilateral 10/28/2013   Procedure: BILATERAL CARPAL TUNNEL RELEASE;  Surgeon: Daryll Brod, MD;  Location: [place];  Service: Orthopedics;  Laterality: Bilateral;  . CESAREAN SECTION  2000; 08/20/2001  . ESSURE TUBAL LIGATION  2005   failed, tubal puncture  . LAPAROSCOPIC TUBAL LIGATION  04/02/2007   removal Essure  . LAPAROSCOPIC UNILATERAL SALPINGECTOMY  2005  . MASTECTOMY W/ SENTINEL NODE BIOPSY Bilateral 06/05/2017   Procedure: BILATERAL MASTECTOMIES  WITH RIGHT SENTINEL LYMPH NODE BIOPSY;  Surgeon: [name], MD;   Location: [place];  Service: General;  Laterality: Bilateral;  . PORTACATH PLACEMENT Left 06/26/2017   Procedure: INSERTION PORT-A-CATH;  Surgeon: [name], MD;  Location: [place];  Service: General;  Laterality: Left;  . TRIGGER FINGER RELEASE Left 04/12/2014   Procedure: RELEASE A-1 PULLEY LEFT THUMB;  Surgeon: Daryll Brod, MD;  Location: [place];  Service: Orthopedics;  Laterality: Left;  . TRIGGER FINGER RELEASE Right 07/19/2014   Procedure: RELEASE TRIGGER FINGER/A-1 PULLEY RIGHT THUMB;  Surgeon: Daryll Brod, MD;  Location: [place];  Service: Orthopedics;  Laterality: Right;    There were no vitals filed for this visit.  Subjective Assessment - 08/04/17 0938    Subjective  Pt reports felt much better on Friday after Thursdays appt.  She could tell all her "numbers" were up. Not having any pain today except tenderness in scalp from hair loss.    Pertinent History  Patient was diagnosed on 05/08/17 with right invasive ductal carcinoma breast cancer. She was seen in 5/18 and found to have a right axillary mass but did not f/u for further testing. She then had a mammogram in 2/19 and was diagnosed with right breast cancer which had metastasized to the axillary lymph nodes. She had multiple small masses in the lower outer quadrant of her right breast with the largest mass measuring 1.3 cm. It is ER/PR positive and HER2 negative  with a Ki67 of 2%. Patient underwent a bilateral mastectomy with right targeted node dissection with 4/7 positive on 06/05/17 for right invasive ductal carcinoma. She then underwent a right axillary node dissection on 06/26/17 with 2/20 positive nodes. She also had a port-a-cath placed on 06/26/17. She currently has 3 drains in place and will see her surgeon on 07/17/17. The plan is chemotherapy, radiation, and anti-estrogen therapy.     Patient Stated Goals  Try to get arm moving and reduce lymphedema risk    Currently in Pain?  No/denies                        [facility] Adult PT Treatment/Exercise - 08/04/17 0001      Manual Therapy   Manual therapy comments  Manual lymph drainage to left chest to reduce edema: pt supine - short neck, left inguinal nodes, left axillo-inguinal pathways, left chest focused on area inferior to incision site.    Passive ROM  PROM to bilateral shoulders in supine focused on flexion and abduction. All PROM to pt tolerance. She reported feeling relief in her arms with PROM. PROM performed to her tolerance.    Neural Stretch  Passive neural stretch to rt UE in supine to pt tolerance. Cording noted into her elbow.                  PT Long Term Goals - 07/03/17 0930      PT LONG TERM GOAL #1   Title  Patient will demonstrate she has returned to baseline related to shoulder ROM and function.    Time  6    Period  Weeks    Status  New      PT LONG TERM GOAL #2   Title  Patient will increase bilateral shoulder flexion AROM to >/= 140 degrees for increased ease reaching overhead.    Time  6    Period  Weeks    Status  New      PT LONG TERM GOAL #3   Title  Patient will increase bilateral shoulder abduction AROM to >/= 140 degrees for increased ease reaching overhead.    Time  6    Period  Weeks    Status  New      PT LONG TERM GOAL #4   Title  Patient will report she has been able to return to >/= 75% of normal daily tasks.    Time  6    Period  Weeks    Status  New      PT LONG TERM GOAL #5   Title  Patient is able to verbalize understanding of where and how to be fitted for a compression sleeve for flying and also risk reduction practices for lymphedema.    Time  6    Period  Weeks    Status  New      Breast Clinic Goals - 05/15/17 0803      Patient will be able to verbalize understanding of pertinent lymphedema risk reduction practices relevant to her diagnosis specifically related to skin care.   Time  1    Period  Days    Status  Achieved      Patient will  be able to return demonstrate and/or verbalize understanding of the post-op home exercise program related to regaining shoulder range of motion.   Time  1    Period  Days    Status  Achieved  Patient will be able to verbalize understanding of the importance of attending the postoperative After Breast Cancer Class for further lymphedema risk reduction education and therapeutic exercise.   Time  1    Period  Days    Status  Achieved           Plan - 08/04/17 1026    Clinical Impression Statement  Pt felt better today and tolerated stretching very well. She is able to relax well during stretching and her end ROM visibly improved by end of session. Pt reports noticing overall her chest tightness has improved well since starting therapy and she is able to do more around the house (like reaching higher).    Rehab Potential  Excellent    Clinical Impairments Affecting Rehab Potential  Recent surgery with extensive axillary dissection    PT Frequency  2x / week    PT Duration  4 weeks    PT Treatment/Interventions  ADLs/Self Care Home Management;Therapeutic activities;Therapeutic exercise;Patient/family education;Manual techniques;Manual lymph drainage;Scar mobilization;Passive range of motion    PT Next Visit Plan  Assess goals and ROM next. Continue massage on left chest scar and MLD; PROM to bil shoulders as tolerated. Gently progress AA/ROM as tolerated.     Consulted and Agree with Plan of Care  Patient       Patient will benefit from skilled therapeutic intervention in order to improve the following deficits and impairments:  Pain, Decreased scar mobility, Decreased range of motion, Impaired UE functional use, Postural dysfunction, Decreased knowledge of precautions, Increased fascial restricitons, Decreased activity tolerance, Decreased knowledge of use of DME, Increased edema  Visit Diagnosis: Abnormal posture  Aftercare following surgery for neoplasm  Stiffness of left  shoulder, not elsewhere classified  Stiffness of right shoulder, not elsewhere classified  Pain in left upper arm  Pain in right upper arm  Malignant neoplasm of axillary tail of right breast in female, estrogen receptor positive ([place])     Problem List Patient Active Problem List   Diagnosis Date Noted  . Breast cancer metastasized to axillary lymph node, right ([place]) 06/05/2017  . Genetic testing 05/27/2017  . Malignant neoplasm of axillary tail of right breast in female, estrogen receptor positive ([place]) 05/09/2017  . Cubital tunnel syndrome on left 10/02/2015  . Trigger finger, left index finger 04/12/2015  . Erroneous encounter - disregard 09/22/2014  . Diabetes type 1, controlled ([place]) 01/12/2013    Otelia Limes, PTA 08/04/2017, 10:39 AM  [place], [place], [address] Phone: [phone]   Fax:  [phone]  Name: PATICIA MOSTER MRN: [id number] Date of Birth: 1975/04/19

## 2017-08-05 ENCOUNTER — Inpatient Hospital Stay: Payer: BLUE CROSS/BLUE SHIELD

## 2017-08-05 ENCOUNTER — Inpatient Hospital Stay (HOSPITAL_BASED_OUTPATIENT_CLINIC_OR_DEPARTMENT_OTHER): Payer: BLUE CROSS/BLUE SHIELD | Admitting: Hematology and Oncology

## 2017-08-05 ENCOUNTER — Other Ambulatory Visit: Payer: BLUE CROSS/BLUE SHIELD

## 2017-08-05 ENCOUNTER — Ambulatory Visit: Payer: BLUE CROSS/BLUE SHIELD | Admitting: Hematology and Oncology

## 2017-08-05 ENCOUNTER — Ambulatory Visit: Payer: BLUE CROSS/BLUE SHIELD

## 2017-08-05 VITALS — BP 140/86 | HR 77

## 2017-08-05 DIAGNOSIS — Z794 Long term (current) use of insulin: Secondary | ICD-10-CM | POA: Diagnosis not present

## 2017-08-05 DIAGNOSIS — Z9641 Presence of insulin pump (external) (internal): Secondary | ICD-10-CM | POA: Diagnosis not present

## 2017-08-05 DIAGNOSIS — C773 Secondary and unspecified malignant neoplasm of axilla and upper limb lymph nodes: Principal | ICD-10-CM

## 2017-08-05 DIAGNOSIS — Z170 Estrogen receptor positive status [ER+]: Secondary | ICD-10-CM

## 2017-08-05 DIAGNOSIS — E119 Type 2 diabetes mellitus without complications: Secondary | ICD-10-CM

## 2017-08-05 DIAGNOSIS — C50611 Malignant neoplasm of axillary tail of right female breast: Secondary | ICD-10-CM

## 2017-08-05 DIAGNOSIS — C50911 Malignant neoplasm of unspecified site of right female breast: Secondary | ICD-10-CM

## 2017-08-05 DIAGNOSIS — D701 Agranulocytosis secondary to cancer chemotherapy: Secondary | ICD-10-CM

## 2017-08-05 DIAGNOSIS — Z5111 Encounter for antineoplastic chemotherapy: Secondary | ICD-10-CM | POA: Diagnosis not present

## 2017-08-05 DIAGNOSIS — Z5189 Encounter for other specified aftercare: Secondary | ICD-10-CM | POA: Diagnosis not present

## 2017-08-05 LAB — CMP (CANCER CENTER ONLY)
ALK PHOS: 111 U/L (ref 40–150)
ALT: 16 U/L (ref 0–55)
ANION GAP: 8 (ref 3–11)
AST: 13 U/L (ref 5–34)
Albumin: 3.5 g/dL (ref 3.5–5.0)
BUN: 14 mg/dL (ref 7–26)
CALCIUM: 8.7 mg/dL (ref 8.4–10.4)
CO2: 27 mmol/L (ref 22–29)
Chloride: 104 mmol/L (ref 98–109)
Creatinine: 0.81 mg/dL (ref 0.60–1.10)
Glucose, Bld: 166 mg/dL — ABNORMAL HIGH (ref 70–140)
Potassium: 3.9 mmol/L (ref 3.5–5.1)
Sodium: 139 mmol/L (ref 136–145)
TOTAL PROTEIN: 6.9 g/dL (ref 6.4–8.3)
Total Bilirubin: <0.2 mg/dL — ABNORMAL LOW (ref 0.2–1.2)

## 2017-08-05 LAB — CBC WITH DIFFERENTIAL (CANCER CENTER ONLY)
Basophils Absolute: 0.1 10*3/uL (ref 0.0–0.1)
Basophils Relative: 1 %
EOS ABS: 0 10*3/uL (ref 0.0–0.5)
Eosinophils Relative: 0 %
HEMATOCRIT: 35.8 % (ref 34.8–46.6)
HEMOGLOBIN: 11.3 g/dL — AB (ref 11.6–15.9)
Lymphocytes Relative: 33 %
Lymphs Abs: 2.1 10*3/uL (ref 0.9–3.3)
MCH: 26.2 pg (ref 25.1–34.0)
MCHC: 31.6 g/dL (ref 31.5–36.0)
MCV: 83.1 fL (ref 79.5–101.0)
MONOS PCT: 10 %
Monocytes Absolute: 0.6 10*3/uL (ref 0.1–0.9)
NEUTROS ABS: 3.6 10*3/uL (ref 1.5–6.5)
NEUTROS PCT: 56 %
Platelet Count: 174 10*3/uL (ref 145–400)
RBC: 4.31 MIL/uL (ref 3.70–5.45)
RDW: 14.6 % — ABNORMAL HIGH (ref 11.2–14.5)
WBC: 6.4 10*3/uL (ref 3.9–10.3)

## 2017-08-05 MED ORDER — PALONOSETRON HCL INJECTION 0.25 MG/5ML
0.2500 mg | Freq: Once | INTRAVENOUS | Status: AC
  Administered 2017-08-05: 0.25 mg via INTRAVENOUS

## 2017-08-05 MED ORDER — HEPARIN SOD (PORK) LOCK FLUSH 100 UNIT/ML IV SOLN
500.0000 [IU] | Freq: Once | INTRAVENOUS | Status: AC | PRN
  Administered 2017-08-05: 500 [IU]
  Filled 2017-08-05: qty 5

## 2017-08-05 MED ORDER — FOSAPREPITANT DIMEGLUMINE INJECTION 150 MG
Freq: Once | INTRAVENOUS | Status: AC
  Administered 2017-08-05: 11:00:00 via INTRAVENOUS
  Filled 2017-08-05: qty 5

## 2017-08-05 MED ORDER — PALONOSETRON HCL INJECTION 0.25 MG/5ML
INTRAVENOUS | Status: AC
  Filled 2017-08-05: qty 5

## 2017-08-05 MED ORDER — SODIUM CHLORIDE 0.9 % IV SOLN
Freq: Once | INTRAVENOUS | Status: AC
  Administered 2017-08-05: 11:00:00 via INTRAVENOUS

## 2017-08-05 MED ORDER — SODIUM CHLORIDE 0.9% FLUSH
10.0000 mL | INTRAVENOUS | Status: DC | PRN
  Administered 2017-08-05: 10 mL
  Filled 2017-08-05: qty 10

## 2017-08-05 MED ORDER — SODIUM CHLORIDE 0.9 % IV SOLN
500.0000 mg/m2 | Freq: Once | INTRAVENOUS | Status: AC
  Administered 2017-08-05: 920 mg via INTRAVENOUS
  Filled 2017-08-05: qty 46

## 2017-08-05 MED ORDER — DOXORUBICIN HCL CHEMO IV INJECTION 2 MG/ML
50.0000 mg/m2 | Freq: Once | INTRAVENOUS | Status: AC
  Administered 2017-08-05: 92 mg via INTRAVENOUS
  Filled 2017-08-05: qty 46

## 2017-08-05 NOTE — Patient Instructions (Signed)
[facility] Discharge Instructions for Patients Receiving Chemotherapy  Today you received the following chemotherapy agents Adriamycin and Cytoxan  To help prevent nausea and vomiting after your treatment, we encourage you to take your nausea medication as directed.  If you develop nausea and vomiting that is not controlled by your nausea medication, call the clinic.   BELOW ARE SYMPTOMS THAT SHOULD BE REPORTED IMMEDIATELY:  *FEVER GREATER THAN 100.5 F  *CHILLS WITH OR WITHOUT FEVER  NAUSEA AND VOMITING THAT IS NOT CONTROLLED WITH YOUR NAUSEA MEDICATION  *UNUSUAL SHORTNESS OF BREATH  *UNUSUAL BRUISING OR BLEEDING  TENDERNESS IN MOUTH AND THROAT WITH OR WITHOUT PRESENCE OF ULCERS  *URINARY PROBLEMS  *BOWEL PROBLEMS  UNUSUAL RASH Items with * indicate a potential emergency and should be followed up as soon as possible.  Feel free to call the clinic should you have any questions or concerns. The clinic phone number is [phone].  Please show the CHEMO ALERT CARD at check-in to the Emergency Department and triage nurse.   

## 2017-08-05 NOTE — Assessment & Plan Note (Signed)
06/05/2017:Bilateral mastectomies: Right mastectomy: IDC grade 1, 3 foci 1.2 cm, 1.2 cm, 1 cm, IgA DCIS, LVH diffuse present, margins negative, 4/7 lymph nodes positive with extracapsular extension, ER 95%, PR 95%, HER-2 negative, Ki-67 2% to 10%, T1 cN2 aM0 stage II a pathological stage; left mastectomy: Benign  Mammaprint done preoperatively: Low risk Axillary lymph node dissection 06/26/2017: 2/22 lymph nodes positive(making a total of 6 lymph nodes that were positive)  Treatment plan: 1.Because ofher high risk features (6 LN Pos),werecommended adjuvant systemic chemotherapywithdose dense Adriamycin and Cytoxan x4 followed by Taxol weekly x12 started 07/22/2017 3.Followed by radiation.  4.Followed by adjuvant antiestrogen therapy  UPBEAT clinical trial ([id number]):No toxicities related to the trial --------------------------------------------------------------------------- Current treatment: Cycle 2 day 1 dose dense Adriamycin and Cytoxan Labs reviewed Echocardiogram 06/20/2017: EF 55 to 60%  Chemo toxicities: 1.   Neutropenia: We will decrease the dosage of cycle 2 chemotherapy 2. discontinue dexamethasone because of diabetes  Patient does not want to wear any wig  Diabetes mellitus: Patient has an insulin pump  Return to clinic in 2 weeks for cycle 3

## 2017-08-05 NOTE — Progress Notes (Signed)
Patient Care Team: Tisovec, Fransico Him, MD as PCP - General (Internal Medicine)  DIAGNOSIS:  Encounter Diagnosis  Name Primary?  . Malignant neoplasm of axillary tail of right breast in female, estrogen receptor positive ([place])     SUMMARY OF ONCOLOGIC HISTORY:   Malignant neoplasm of axillary tail of right breast in female, estrogen receptor positive ([place])   05/09/2017 Initial Diagnosis    May 2018: Palpable right axillary mass.  The recommended a 6-week follow-up but apparently the mass decreased in size.  Recent increase in the mass was noted, biopsy revealed Grade 1 IDC with DCIS, lymphovascular invasion present, 4 small hypoechoic masses largest 0.9 cm, T1BN1 stage Ib clinical stage      06/05/2017 Surgery    Bilateral mastectomies: Right mastectomy: IDC grade 1, 3 foci 1.2 cm, 1.2 cm, 1 cm, IgA DCIS, LVH diffuse present, margins negative, 4/7 lymph nodes positive with extracapsular extension, ER 95%, PR 95%, HER-2 negative, Ki-67 2% to 10%, T1 cN2 aM0 stage II a pathological stage; left mastectomy: Benign      06/26/2017 Surgery    Axillary lymph node dissection: 2/22 lymph nodes positive (making a total of 6+ lymph nodes)      07/22/2017 -  Chemotherapy    Adjuvant chemotherapy with dose dense Adriamycin and Cytoxan x4 followed by Taxol weekly x12        Breast cancer metastasized to axillary lymph node, right ([place])   06/05/2017 Initial Diagnosis    Breast cancer metastasized to axillary lymph node, right ([place])      07/22/2017 -  Chemotherapy    Adjuvant chemotherapy with dose dense Adriamycin and Cytoxan x4 followed by Taxol weekly x12        CHIEF COMPLIANT: Cycle 2-day 1 dose dense Adriamycin and Cytoxan  INTERVAL HISTORY: Stephanie Holden is a 42 year old with above-mentioned history of right breast cancer who is currently on adjuvant chemotherapy with dose dense Adriamycin and Cytoxan.  Today is cycle 2.  Overall she tolerated cycle 1 extremely well.  She did not have  any nausea vomiting.  She had neutropenia with an ANC of 0.1 and her dose has been reduced for cycle 2 of chemo.  She had a slight [place] was given Valtrex.  She experienced bone pain due to Neulasta this past week.  Otherwise her energy levels are excellent today.  REVIEW OF SYSTEMS:   Constitutional: Denies fevers, chills or abnormal weight loss Eyes: Denies blurriness of vision Ears, nose, mouth, throat, and face: Denies mucositis or sore throat Respiratory: Denies cough, dyspnea or wheezes Cardiovascular: Denies palpitation, chest discomfort Gastrointestinal:  Denies nausea, heartburn or change in bowel habits Skin: Denies abnormal skin rashes Lymphatics: Denies new lymphadenopathy or easy bruising Neurological:Denies numbness, tingling or new weaknesses Behavioral/Psych: Mood is stable, no new changes  Extremities: No lower extremity edema Breast: Bilateral mastectomies All other systems were reviewed with the patient and are negative.  I have reviewed the past medical history, past surgical history, social history and family history with the patient and they are unchanged from previous note.  ALLERGIES:  is allergic to dilaudid [hydromorphone hcl].  MEDICATIONS:  Current Outpatient Medications  Medication Sig Dispense Refill  . acetaminophen (TYLENOL) 500 MG tablet Take 1,000 mg by mouth every 6 (six) hours as needed for moderate pain or headache.    . dexamethasone (DECADRON) 2 MG tablet Take 1 tablet day after chemotherapy and 1 tablet 2 days after with food. (Patient not taking: Reported on 07/09/2017) 10 tablet  0  . gabapentin (NEURONTIN) 300 MG capsule Take 1 capsule (300 mg total) by mouth at bedtime. 30 capsule 0  . insulin aspart (NOVOLOG) 100 UNIT/ML injection Inject 45 Units into the skin See admin instructions. Use via insulin pump up to 45 units daily    . lidocaine-prilocaine (EMLA) cream Apply to affected area once (Patient not taking: Reported on 07/09/2017) 30 g 3  .  LORazepam (ATIVAN) 0.5 MG tablet Take 1 tablet (0.5 mg total) by mouth every 6 (six) hours as needed for anxiety or sleep. 30 tablet 3  . magic mouthwash w/lidocaine SOLN Take 5 mLs by mouth 4 (four) times daily as needed for mouth pain. Swish, swallow, or spit 240 mL 1  . methocarbamol (ROBAXIN) 500 MG tablet Take 1 tablet (500 mg total) by mouth every 8 (eight) hours as needed for muscle spasms. 30 tablet 1  . ondansetron (ZOFRAN) 8 MG tablet Take 1 tablet (8 mg total) by mouth 2 (two) times daily as needed. Start on the third day after chemotherapy. (Patient not taking: Reported on 07/09/2017) 30 tablet 1  . Probiotic Product (PROBIOTIC DAILY PO) Take 1 capsule by mouth at bedtime.     . prochlorperazine (COMPAZINE) 10 MG tablet Take 1 tablet (10 mg total) by mouth every 6 (six) hours as needed (Nausea or vomiting). (Patient not taking: Reported on 07/09/2017) 30 tablet 1  . tamoxifen (NOLVADEX) 20 MG tablet TAKE 1 TABLET BY MOUTH EVERY DAY 30 tablet 0  . valACYclovir (VALTREX) 500 MG tablet Take 1 tablet (500 mg total) by mouth 2 (two) times daily. 60 tablet 3  . venlafaxine XR (EFFEXOR-XR) 37.5 MG 24 hr capsule Take 1 capsule (37.5 mg total) by mouth daily with breakfast. (Patient taking differently: Take 37.5 mg by mouth at bedtime. ) 30 capsule 3   No current facility-administered medications for this visit.     PHYSICAL EXAMINATION: ECOG PERFORMANCE STATUS: 1 - Symptomatic but completely ambulatory  Vitals:   08/05/17 0956  BP: (!) 153/98  Pulse: 98  Resp: 20  Temp: 98.1 F (36.7 C)  SpO2: 98%   Filed Weights   08/05/17 0956  Weight: 165 lb 6.4 oz (75 kg)    GENERAL:alert, no distress and comfortable SKIN: skin color, texture, turgor are normal, no rashes or significant lesions EYES: normal, Conjunctiva are pink and non-injected, sclera clear OROPHARYNX:no exudate, no erythema and lips, buccal mucosa, and tongue normal  NECK: supple, thyroid normal size, non-tender, without  nodularity LYMPH:  no palpable lymphadenopathy in the cervical, axillary or inguinal LUNGS: clear to auscultation and percussion with normal breathing effort HEART: regular rate & rhythm and no murmurs and no lower extremity edema ABDOMEN:abdomen soft, non-tender and normal bowel sounds MUSCULOSKELETAL:no cyanosis of digits and no clubbing  NEURO: alert & oriented x 3 with fluent speech, no focal motor/sensory deficits EXTREMITIES: No lower extremity edema  LABORATORY DATA:  I have reviewed the data as listed CMP Latest Ref Rng & Units 07/29/2017 07/22/2017 07/09/2017  Glucose 70 - 140 mg/dL 115 218(H) 126  BUN 7 - 26 mg/dL '12 12 14  ' Creatinine 0.60 - 1.10 mg/dL 0.75 0.88 0.82  Sodium 136 - 145 mmol/L 138 139 139  Potassium 3.5 - 5.1 mmol/L 4.1 3.8 4.0  Chloride 98 - 109 mmol/L 104 103 108  CO2 22 - 29 mmol/L '29 28 27  ' Calcium 8.4 - 10.4 mg/dL 8.4 8.9 8.9  Total Protein 6.4 - 8.3 g/dL 6.3(L) 7.3 6.8  Total Bilirubin 0.2 -  1.2 mg/dL 0.3 0.4 0.3  Alkaline Phos 40 - 150 U/L 89 78 68  AST 5 - 34 U/L '11 17 12  ' ALT 0 - 55 U/L '11 13 9    ' Lab Results  Component Value Date   WBC 6.4 08/05/2017   HGB 11.3 (L) 08/05/2017   HCT 35.8 08/05/2017   MCV 83.1 08/05/2017   PLT 174 08/05/2017   NEUTROABS 3.6 08/05/2017    ASSESSMENT & PLAN:  Malignant neoplasm of axillary tail of right breast in female, estrogen receptor positive ([place]) 06/05/2017:Bilateral mastectomies: Right mastectomy: IDC grade 1, 3 foci 1.2 cm, 1.2 cm, 1 cm, IgA DCIS, LVH diffuse present, margins negative, 4/7 lymph nodes positive with extracapsular extension, ER 95%, PR 95%, HER-2 negative, Ki-67 2% to 10%, T1 cN2 aM0 stage II a pathological stage; left mastectomy: Benign  Mammaprint done preoperatively: Low risk Axillary lymph node dissection 06/26/2017: 2/22 lymph nodes positive(making a total of 6 lymph nodes that were positive)  Treatment plan: 1.Because ofher high risk features (6 LN Pos),werecommended adjuvant  systemic chemotherapywithdose dense Adriamycin and Cytoxan x4 followed by Taxol weekly x12 started 07/22/2017 3.Followed by radiation.  4.Followed by adjuvant antiestrogen therapy  UPBEAT clinical trial ([id number]):No toxicities related to the trial --------------------------------------------------------------------------- Current treatment: Cycle 2 day 1 dose dense Adriamycin and Cytoxan Labs reviewed Echocardiogram 06/20/2017: EF 55 to 60%  Chemo toxicities: 1.   Neutropenia: We will decrease the dosage of cycle 2 chemotherapy 2. discontinue dexamethasone because of diabetes  Patient does not want to wear any wig  Diabetes mellitus: Patient has an insulin pump  Return to clinic in 2 weeks for cycle  3    No orders of the defined types were placed in this encounter.  The patient has a good understanding of the overall plan. she agrees with it. she will call with any problems that may develop before the next visit here.   Harriette Ohara, MD 08/05/17

## 2017-08-07 ENCOUNTER — Inpatient Hospital Stay: Payer: BLUE CROSS/BLUE SHIELD

## 2017-08-07 ENCOUNTER — Ambulatory Visit: Payer: BLUE CROSS/BLUE SHIELD

## 2017-08-07 VITALS — BP 127/81 | HR 90 | Temp 98.6°F | Resp 18

## 2017-08-07 DIAGNOSIS — Z170 Estrogen receptor positive status [ER+]: Secondary | ICD-10-CM

## 2017-08-07 DIAGNOSIS — Z5111 Encounter for antineoplastic chemotherapy: Secondary | ICD-10-CM | POA: Diagnosis not present

## 2017-08-07 DIAGNOSIS — M79621 Pain in right upper arm: Secondary | ICD-10-CM

## 2017-08-07 DIAGNOSIS — Z483 Aftercare following surgery for neoplasm: Secondary | ICD-10-CM

## 2017-08-07 DIAGNOSIS — Z794 Long term (current) use of insulin: Secondary | ICD-10-CM | POA: Diagnosis not present

## 2017-08-07 DIAGNOSIS — E119 Type 2 diabetes mellitus without complications: Secondary | ICD-10-CM | POA: Diagnosis not present

## 2017-08-07 DIAGNOSIS — M25611 Stiffness of right shoulder, not elsewhere classified: Secondary | ICD-10-CM

## 2017-08-07 DIAGNOSIS — M25612 Stiffness of left shoulder, not elsewhere classified: Secondary | ICD-10-CM

## 2017-08-07 DIAGNOSIS — C773 Secondary and unspecified malignant neoplasm of axilla and upper limb lymph nodes: Principal | ICD-10-CM

## 2017-08-07 DIAGNOSIS — M79622 Pain in left upper arm: Secondary | ICD-10-CM

## 2017-08-07 DIAGNOSIS — R293 Abnormal posture: Secondary | ICD-10-CM | POA: Diagnosis not present

## 2017-08-07 DIAGNOSIS — C50611 Malignant neoplasm of axillary tail of right female breast: Secondary | ICD-10-CM

## 2017-08-07 DIAGNOSIS — Z5189 Encounter for other specified aftercare: Secondary | ICD-10-CM | POA: Diagnosis not present

## 2017-08-07 DIAGNOSIS — C50911 Malignant neoplasm of unspecified site of right female breast: Secondary | ICD-10-CM

## 2017-08-07 MED ORDER — PEGFILGRASTIM-CBQV 6 MG/0.6ML ~~LOC~~ SOSY
6.0000 mg | PREFILLED_SYRINGE | Freq: Once | SUBCUTANEOUS | Status: AC
  Administered 2017-08-07: 6 mg via SUBCUTANEOUS

## 2017-08-07 NOTE — Patient Instructions (Signed)
Pegfilgrastim injection What is this medicine? PEGFILGRASTIM (PEG fil gra stim) is a long-acting granulocyte colony-stimulating factor that stimulates the growth of neutrophils, a type of white blood cell important in the body's fight against infection. It is used to reduce the incidence of fever and infection in patients with certain types of cancer who are receiving chemotherapy that affects the bone marrow, and to increase survival after being exposed to high doses of radiation. This medicine may be used for other purposes; ask your health care provider or pharmacist if you have questions. COMMON BRAND NAME(S): Neulasta What should I tell my health care provider before I take this medicine? They need to know if you have any of these conditions: -kidney disease -latex allergy -ongoing radiation therapy -sickle cell disease -skin reactions to acrylic adhesives (On-Body Injector only) -an unusual or allergic reaction to pegfilgrastim, filgrastim, other medicines, foods, dyes, or preservatives -pregnant or trying to get pregnant -breast-feeding How should I use this medicine? This medicine is for injection under the skin. If you get this medicine at home, you will be taught how to prepare and give the pre-filled syringe or how to use the On-body Injector. Refer to the patient Instructions for Use for detailed instructions. Use exactly as directed. Tell your healthcare provider immediately if you suspect that the On-body Injector may not have performed as intended or if you suspect the use of the On-body Injector resulted in a missed or partial dose. It is important that you put your used needles and syringes in a special sharps container. Do not put them in a trash can. If you do not have a sharps container, call your pharmacist or healthcare provider to get one. Talk to your pediatrician regarding the use of this medicine in children. While this drug may be prescribed for selected conditions,  precautions do apply. Overdosage: If you think you have taken too much of this medicine contact a poison control center or emergency room at once. NOTE: This medicine is only for you. Do not share this medicine with others. What if I miss a dose? It is important not to miss your dose. Call your doctor or health care professional if you miss your dose. If you miss a dose due to an On-body Injector failure or leakage, a new dose should be administered as soon as possible using a single prefilled syringe for manual use. What may interact with this medicine? Interactions have not been studied. Give your health care provider a list of all the medicines, herbs, non-prescription drugs, or dietary supplements you use. Also tell them if you smoke, drink alcohol, or use illegal drugs. Some items may interact with your medicine. This list may not describe all possible interactions. Give your health care provider a list of all the medicines, herbs, non-prescription drugs, or dietary supplements you use. Also tell them if you smoke, drink alcohol, or use illegal drugs. Some items may interact with your medicine. What should I watch for while using this medicine? You may need blood work done while you are taking this medicine. If you are going to need a MRI, CT scan, or other procedure, tell your doctor that you are using this medicine (On-Body Injector only). What side effects may I notice from receiving this medicine? Side effects that you should report to your doctor or health care professional as soon as possible: -allergic reactions like skin rash, itching or hives, swelling of the face, lips, or tongue -dizziness -fever -pain, redness, or irritation at site   where injected -pinpoint red spots on the skin -red or dark-brown urine -shortness of breath or breathing problems -stomach or side pain, or pain at the shoulder -swelling -tiredness -trouble passing urine or change in the amount of urine Side  effects that usually do not require medical attention (report to your doctor or health care professional if they continue or are bothersome): -bone pain -muscle pain This list may not describe all possible side effects. Call your doctor for medical advice about side effects. You may report side effects to FDA at 1-800-FDA-1088. Where should I keep my medicine? Keep out of the reach of children. Store pre-filled syringes in a refrigerator between 2 and 8 degrees C (36 and 46 degrees F). Do not freeze. Keep in carton to protect from light. Throw away this medicine if it is left out of the refrigerator for more than 48 hours. Throw away any unused medicine after the expiration date. NOTE: This sheet is a summary. It may not cover all possible information. If you have questions about this medicine, talk to your doctor, pharmacist, or health care provider.  2018 Elsevier/Gold Standard (2016-03-07 12:58:03)  

## 2017-08-07 NOTE — Therapy (Signed)
[place], [place], [address] Phone: [phone]   Fax:  [phone]  Physical Therapy Treatment  Patient Details  Name: Stephanie Holden MRN: [id number] Date of Birth: February 21, 1976 Referring Provider: Dr. [name]   Encounter Date: 08/07/2017  PT End of Session - 08/07/17 1107    Visit Number  8    Number of Visits  12    Date for PT Re-Evaluation  08/14/17    PT Start Time  1017    PT Stop Time  1109    PT Time Calculation (min)  46 min    Activity Tolerance  Patient tolerated treatment well    Behavior During Therapy  [facility] for tasks assessed/performed       Past Medical History:  Diagnosis Date  . Cancer [facility])    Breast cancer - right  . Constipation   . Depression   . Genetic testing 05/27/2017   Breast/GYN panel (23 genes) @ [facility] - No pathogenic mutations detected  . GERD (gastroesophageal reflux disease)   . Insulin dependent type 1 diabetes mellitus ([place])   . Insulin pump in place   . PONV (postoperative nausea and vomiting)   . Stenosing tenosynovitis of thumb 06/2014   right  . Thyroid goiter     Past Surgical History:  Procedure Laterality Date  . AXILLARY LYMPH NODE DISSECTION Right 06/26/2017   Procedure: AXILLARY LYMPH NODE DISSECTION;  Surgeon: [name], MD;  Location: [place];  Service: General;  Laterality: Right;  . CARPAL TUNNEL RELEASE Bilateral 10/28/2013   Procedure: BILATERAL CARPAL TUNNEL RELEASE;  Surgeon: Daryll Brod, MD;  Location: [place];  Service: Orthopedics;  Laterality: Bilateral;  . CESAREAN SECTION  2000; 08/20/2001  . ESSURE TUBAL LIGATION  2005   failed, tubal puncture  . LAPAROSCOPIC TUBAL LIGATION  04/02/2007   removal Essure  . LAPAROSCOPIC UNILATERAL SALPINGECTOMY  2005  . MASTECTOMY W/ SENTINEL NODE BIOPSY Bilateral 06/05/2017   Procedure: BILATERAL MASTECTOMIES  WITH RIGHT SENTINEL LYMPH NODE BIOPSY;  Surgeon: [name], MD;   Location: [place];  Service: General;  Laterality: Bilateral;  . PORTACATH PLACEMENT Left 06/26/2017   Procedure: INSERTION PORT-A-CATH;  Surgeon: [name], MD;  Location: [place];  Service: General;  Laterality: Left;  . TRIGGER FINGER RELEASE Left 04/12/2014   Procedure: RELEASE A-1 PULLEY LEFT THUMB;  Surgeon: Daryll Brod, MD;  Location: [place];  Service: Orthopedics;  Laterality: Left;  . TRIGGER FINGER RELEASE Right 07/19/2014   Procedure: RELEASE TRIGGER FINGER/A-1 PULLEY RIGHT THUMB;  Surgeon: Daryll Brod, MD;  Location: [place];  Service: Orthopedics;  Laterality: Right;    There were no vitals filed for this visit.  Subjective Assessment - 08/07/17 1026    Subjective  I finally had to cut my hair real short. My husband did it for me. My shoulders are feeling pretty good today, overall I can tell improvements each day.     Pertinent History  Patient was diagnosed on 05/08/17 with right invasive ductal carcinoma breast cancer. She was seen in 5/18 and found to have a right axillary mass but did not f/u for further testing. She then had a mammogram in 2/19 and was diagnosed with right breast cancer which had metastasized to the axillary lymph nodes. She had multiple small masses in the lower outer quadrant of her right breast with the largest mass measuring 1.3 cm. It is ER/PR positive and HER2 negative with  a Ki67 of 2%. Patient underwent a bilateral mastectomy with right targeted node dissection with 4/7 positive on 06/05/17 for right invasive ductal carcinoma. She then underwent a right axillary node dissection on 06/26/17 with 2/20 positive nodes. She also had a port-a-cath placed on 06/26/17. She currently has 3 drains in place and will see her surgeon on 07/17/17. The plan is chemotherapy, radiation, and anti-estrogen therapy.     Patient Stated Goals  Try to get arm moving and reduce lymphedema risk    Currently in Pain?  No/denies         [facility] PT Assessment  - 08/07/17 0001      AROM   Right Shoulder Flexion  150 Degrees    Right Shoulder ABduction  131 Degrees    Right Shoulder Internal Rotation  73 Degrees    Left Shoulder Extension  41 Degrees    Left Shoulder Flexion  150 Degrees    Left Shoulder ABduction  124 Degrees    Left Shoulder Internal Rotation  53 Degrees                   [facility] Adult PT Treatment/Exercise - 08/07/17 0001      Shoulder Exercises: Pulleys   Flexion  2 minutes    Flexion Limitations  VCs to remind pt of correct technique    ABduction  2 minutes    ABduction Limitations  VCs to relax shoulders throughout      Shoulder Exercises: Therapy Ball   Flexion  Both;10 reps With forward lean into end of stretch    ABduction  5 reps;Both    ABduction Limitations  Therapist demo to remind of correct technique      Manual Therapy   Manual therapy comments  Manual lymph drainage to left chest to reduce edema: pt supine - short neck, left inguinal nodes, left axillo-inguinal pathways, left chest focused on area inferior to incision site.    Passive ROM  PROM to bilateral shoulders in supine focused on flexion and abduction. All PROM to pt tolerance. She reported feeling relief in her arms with PROM.     Neural Stretch  Passive neural stretch to rt UE in supine to pt tolerance. Cording noted into her elbow.                  PT Long Term Goals - 07/03/17 0930      PT LONG TERM GOAL #1   Title  Patient will demonstrate she has returned to baseline related to shoulder ROM and function.    Time  6    Period  Weeks    Status  New      PT LONG TERM GOAL #2   Title  Patient will increase bilateral shoulder flexion AROM to >/= 140 degrees for increased ease reaching overhead.    Time  6    Period  Weeks    Status  New      PT LONG TERM GOAL #3   Title  Patient will increase bilateral shoulder abduction AROM to >/= 140 degrees for increased ease reaching overhead.    Time  6    Period  Weeks     Status  New      PT LONG TERM GOAL #4   Title  Patient will report she has been able to return to >/= 75% of normal daily tasks.    Time  6    Period  Weeks    Status  New      PT LONG TERM GOAL #5   Title  Patient is able to verbalize understanding of where and how to be fitted for a compression sleeve for flying and also risk reduction practices for lymphedema.    Time  6    Period  Weeks    Status  New      Breast Clinic Goals - 05/15/17 0803      Patient will be able to verbalize understanding of pertinent lymphedema risk reduction practices relevant to her diagnosis specifically related to skin care.   Time  1    Period  Days    Status  Achieved      Patient will be able to return demonstrate and/or verbalize understanding of the post-op home exercise program related to regaining shoulder range of motion.   Time  1    Period  Days    Status  Achieved      Patient will be able to verbalize understanding of the importance of attending the postoperative After Breast Cancer Class for further lymphedema risk reduction education and therapeutic exercise.   Time  1    Period  Days    Status  Achieved           Plan - 08/07/17 1107    Clinical Impression Statement  Continued with manual stretching focusing on end ROM for bil shoulders and pt continues to tolerate AA/ROM well. Her end ROM is still limited but pt is progressing well as her measurements have improved since last measured.     Rehab Potential  Excellent    Clinical Impairments Affecting Rehab Potential  Recent surgery with extensive axillary dissection    PT Frequency  2x / week    PT Duration  4 weeks    PT Treatment/Interventions  ADLs/Self Care Home Management;Therapeutic activities;Therapeutic exercise;Patient/family education;Manual techniques;Manual lymph drainage;Scar mobilization;Passive range of motion    PT Next Visit Plan   Continue massage on left chest scar and MLD; PROM to bil shoulders as  tolerated. Gently progress AA/ROM as tolerated, try "snow angels" with back against wall     Consulted and Agree with Plan of Care  Patient       Patient will benefit from skilled therapeutic intervention in order to improve the following deficits and impairments:  Pain, Decreased scar mobility, Decreased range of motion, Impaired UE functional use, Postural dysfunction, Decreased knowledge of precautions, Increased fascial restricitons, Decreased activity tolerance, Decreased knowledge of use of DME, Increased edema  Visit Diagnosis: Abnormal posture  Aftercare following surgery for neoplasm  Stiffness of left shoulder, not elsewhere classified  Stiffness of right shoulder, not elsewhere classified  Pain in left upper arm  Pain in right upper arm  Malignant neoplasm of axillary tail of right breast in female, estrogen receptor positive ([place])     Problem List Patient Active Problem List   Diagnosis Date Noted  . Breast cancer metastasized to axillary lymph node, right ([place]) 06/05/2017  . Genetic testing 05/27/2017  . Malignant neoplasm of axillary tail of right breast in female, estrogen receptor positive ([place]) 05/09/2017  . Cubital tunnel syndrome on left 10/02/2015  . Trigger finger, left index finger 04/12/2015  . Erroneous encounter - disregard 09/22/2014  . Diabetes type 1, controlled ([place]) 01/12/2013    Otelia Limes, PTA 08/07/2017, 11:16 AM  [place], [place], [address] Phone: [phone]   Fax:  [phone]  Name: Stephanie Holden  MRN: [id number] Date of Birth: 12-19-1975

## 2017-08-08 DIAGNOSIS — E109 Type 1 diabetes mellitus without complications: Secondary | ICD-10-CM | POA: Diagnosis not present

## 2017-08-08 DIAGNOSIS — Z794 Long term (current) use of insulin: Secondary | ICD-10-CM | POA: Diagnosis not present

## 2017-08-11 ENCOUNTER — Ambulatory Visit: Payer: BLUE CROSS/BLUE SHIELD | Admitting: Physical Therapy

## 2017-08-12 NOTE — Telephone Encounter (Signed)
Joy will check on this

## 2017-08-14 ENCOUNTER — Ambulatory Visit: Payer: BLUE CROSS/BLUE SHIELD

## 2017-08-14 DIAGNOSIS — C50919 Malignant neoplasm of unspecified site of unspecified female breast: Secondary | ICD-10-CM | POA: Diagnosis not present

## 2017-08-14 DIAGNOSIS — M25612 Stiffness of left shoulder, not elsewhere classified: Secondary | ICD-10-CM

## 2017-08-14 DIAGNOSIS — M25611 Stiffness of right shoulder, not elsewhere classified: Secondary | ICD-10-CM

## 2017-08-14 DIAGNOSIS — Z483 Aftercare following surgery for neoplasm: Secondary | ICD-10-CM

## 2017-08-14 DIAGNOSIS — C50611 Malignant neoplasm of axillary tail of right female breast: Secondary | ICD-10-CM

## 2017-08-14 DIAGNOSIS — Z170 Estrogen receptor positive status [ER+]: Secondary | ICD-10-CM

## 2017-08-14 DIAGNOSIS — R293 Abnormal posture: Secondary | ICD-10-CM

## 2017-08-14 DIAGNOSIS — M79622 Pain in left upper arm: Secondary | ICD-10-CM

## 2017-08-14 DIAGNOSIS — M79621 Pain in right upper arm: Secondary | ICD-10-CM | POA: Diagnosis not present

## 2017-08-14 NOTE — Therapy (Signed)
[place], [place], [address] Phone: [phone]   Fax:  [phone]  Physical Therapy Treatment  Patient Details  Name: Stephanie Holden MRN: [id number] Date of Birth: [birth year] Referring Provider: Dr. [name]   Encounter Date: 08/14/2017  PT End of Session - 08/14/17 1230    Visit Number  9    Number of Visits  12    Date for PT Re-Evaluation  08/14/17    PT Start Time  1108    PT Stop Time  1159    PT Time Calculation (min)  51 min    Activity Tolerance  Patient tolerated treatment well    Behavior During Therapy  [facility] for tasks assessed/performed       Past Medical History:  Diagnosis Date  . Cancer [facility])    Breast cancer - right  . Constipation   . Depression   . Genetic testing 05/27/2017   Breast/GYN panel (23 genes) @ [facility] - No pathogenic mutations detected  . GERD (gastroesophageal reflux disease)   . Insulin dependent type 1 diabetes mellitus ([place])   . Insulin pump in place   . PONV (postoperative nausea and vomiting)   . Stenosing tenosynovitis of thumb 06/2014   right  . Thyroid goiter     Past Surgical History:  Procedure Laterality Date  . AXILLARY LYMPH NODE DISSECTION Right 06/26/2017   Procedure: AXILLARY LYMPH NODE DISSECTION;  Surgeon: [name], MD;  Location: [place];  Service: General;  Laterality: Right;  . CARPAL TUNNEL RELEASE Bilateral 10/28/2013   Procedure: BILATERAL CARPAL TUNNEL RELEASE;  Surgeon: Daryll Brod, MD;  Location: [place];  Service: Orthopedics;  Laterality: Bilateral;  . CESAREAN SECTION  2000; 08/20/2001  . ESSURE TUBAL LIGATION  2005   failed, tubal puncture  . LAPAROSCOPIC TUBAL LIGATION  04/02/2007   removal Essure  . LAPAROSCOPIC UNILATERAL SALPINGECTOMY  2005  . MASTECTOMY W/ SENTINEL NODE BIOPSY Bilateral 06/05/2017   Procedure: BILATERAL MASTECTOMIES  WITH RIGHT SENTINEL LYMPH NODE BIOPSY;  Surgeon: [name], MD;   Location: [place];  Service: General;  Laterality: Bilateral;  . PORTACATH PLACEMENT Left 06/26/2017   Procedure: INSERTION PORT-A-CATH;  Surgeon: [name], MD;  Location: [place];  Service: General;  Laterality: Left;  . TRIGGER FINGER RELEASE Left 04/12/2014   Procedure: RELEASE A-1 PULLEY LEFT THUMB;  Surgeon: Daryll Brod, MD;  Location: [place];  Service: Orthopedics;  Laterality: Left;  . TRIGGER FINGER RELEASE Right 07/19/2014   Procedure: RELEASE TRIGGER FINGER/A-1 PULLEY RIGHT THUMB;  Surgeon: Daryll Brod, MD;  Location: [place];  Service: Orthopedics;  Laterality: Right;    There were no vitals filed for this visit.  Subjective Assessment - 08/14/17 1112    Subjective  My Lt axilla has started having a sharp pain when I stretch at the end of my motion (demonstrates abduction). Not sure what I did. But my Rt shoulder is doing well.     Pertinent History  Patient was diagnosed on 05/08/17 with right invasive ductal carcinoma breast cancer. She was seen in 5/18 and found to have a right axillary mass but did not f/u for further testing. She then had a mammogram in 2/19 and was diagnosed with right breast cancer which had metastasized to the axillary lymph nodes. She had multiple small masses in the lower outer quadrant of her right breast with the largest mass measuring 1.3 cm. It is ER/PR positive and  HER2 negative with a Ki67 of 2%. Patient underwent a bilateral mastectomy with right targeted node dissection with 4/7 positive on 06/05/17 for right invasive ductal carcinoma. She then underwent a right axillary node dissection on 06/26/17 with 2/20 positive nodes. She also had a port-a-cath placed on 06/26/17. She currently has 3 drains in place and will see her surgeon on 07/17/17. The plan is chemotherapy, radiation, and anti-estrogen therapy.     Patient Stated Goals  Try to get arm moving and reduce lymphedema risk    Currently in Pain?  Yes    Pain Score  6      Pain Location  Axilla    Pain Orientation  Left    Pain Descriptors / Indicators  Sharp    Pain Type  Surgical pain    Pain Onset  In the past 7 days    Pain Frequency  Intermittent    Aggravating Factors   end of ROM    Pain Relieving Factors  rest                       [facility] Adult PT Treatment/Exercise - 08/14/17 0001      Shoulder Exercises: Standing   Other Standing Exercises  3 way raises with back and head against wall (flexion, scaption, and abduction) 15 times each with no weights      Shoulder Exercises: Pulleys   Flexion  2 minutes    ABduction  2 minutes    ABduction Limitations  Pt performing this well today      Shoulder Exercises: Therapy Ball   Flexion  Both;10 reps With forward lean into end of stretch; 1 lb on wrist    ABduction  Both;10 reps Same side lean into end of stretch; 1 lb on wrists      Manual Therapy   Manual Therapy  Taping;Manual Lymphatic Drainage (MLD);Passive ROM;Neural Stretch    Manual therapy comments  --    Manual Lymphatic Drainage (MLD)  In Supine: Short neck, Lt inguinal nodes, Lt axillo-inguinal anastomosis, Lt chest inferior to incision at area of swelling    Passive ROM  PROM to bilateral shoulders in supine focused on flexion and abduction. All PROM to pt tolerance.    Neural Stretch  Passive neural stretch to Lt UE in supine to pt tolerance. Cording no longer palpable by therapist and pt reports having not noticed this for past few days.     Kinesiotex  Edema      Kinesiotix   Edema  Skin cote applied then 3 finger shaped along Lt axillo-inguinal anastomosis up to inferior to incision                  PT Long Term Goals - 07/03/17 0930      PT LONG TERM GOAL #1   Title  Patient will demonstrate she has returned to baseline related to shoulder ROM and function.    Time  6    Period  Weeks    Status  New      PT LONG TERM GOAL #2   Title  Patient will increase bilateral shoulder flexion AROM to >/=  140 degrees for increased ease reaching overhead.    Time  6    Period  Weeks    Status  New      PT LONG TERM GOAL #3   Title  Patient will increase bilateral shoulder abduction AROM to >/= 140 degrees for increased ease reaching  overhead.    Time  6    Period  Weeks    Status  New      PT LONG TERM GOAL #4   Title  Patient will report she has been able to return to >/= 75% of normal daily tasks.    Time  6    Period  Weeks    Status  New      PT LONG TERM GOAL #5   Title  Patient is able to verbalize understanding of where and how to be fitted for a compression sleeve for flying and also risk reduction practices for lymphedema.    Time  6    Period  Weeks    Status  New      Breast Clinic Goals - 05/15/17 0803      Patient will be able to verbalize understanding of pertinent lymphedema risk reduction practices relevant to her diagnosis specifically related to skin care.   Time  1    Period  Days    Status  Achieved      Patient will be able to return demonstrate and/or verbalize understanding of the post-op home exercise program related to regaining shoulder range of motion.   Time  1    Period  Days    Status  Achieved      Patient will be able to verbalize understanding of the importance of attending the postoperative After Breast Cancer Class for further lymphedema risk reduction education and therapeutic exercise.   Time  1    Period  Days    Status  Achieved           Plan - 08/14/17 1232    Clinical Impression Statement  Started progressing pt with light weights today with ball on wall which she tolerated well. Also began 3 way raises without weights. Continued with P/ROM stretching to end ROMa nd myofascial release to Lt>Rt mastectomy incisions. Also trial of kinesiotape today along Lt axillo-inguinal anastomosis to help further reduce pocket of swelling inferior to incision.     Rehab Potential  Excellent    Clinical Impairments Affecting Rehab Potential   Recent surgery with extensive axillary dissection    PT Frequency  2x / week    PT Duration  4 weeks    PT Treatment/Interventions  ADLs/Self Care Home Management;Therapeutic activities;Therapeutic exercise;Patient/family education;Manual techniques;Manual lymph drainage;Scar mobilization;Passive range of motion    PT Next Visit Plan  Reasses goals and renewal at next visit. Add weights to 3 way raises next and cont end ROM stretching with myofascial release to Lt incision. Assess kinesiotape.     Consulted and Agree with Plan of Care  Patient       Patient will benefit from skilled therapeutic intervention in order to improve the following deficits and impairments:  Pain, Decreased scar mobility, Decreased range of motion, Impaired UE functional use, Postural dysfunction, Decreased knowledge of precautions, Increased fascial restricitons, Decreased activity tolerance, Decreased knowledge of use of DME, Increased edema  Visit Diagnosis: Abnormal posture  Aftercare following surgery for neoplasm  Stiffness of left shoulder, not elsewhere classified  Stiffness of right shoulder, not elsewhere classified  Pain in left upper arm  Pain in right upper arm  Malignant neoplasm of axillary tail of right breast in female, estrogen receptor positive ([place])     Problem List Patient Active Problem List   Diagnosis Date Noted  . Breast cancer metastasized to axillary lymph node, right ([place]) 06/05/2017  . Genetic testing 05/27/2017  .  Malignant neoplasm of axillary tail of right breast in female, estrogen receptor positive ([place]) 05/09/2017  . Cubital tunnel syndrome on left 10/02/2015  . Trigger finger, left index finger 04/12/2015  . Erroneous encounter - disregard 09/22/2014  . Diabetes type 1, controlled ([place]) 01/12/2013    Otelia Limes, PTA 08/14/2017, 12:40 PM  [place], [place],  [address] Phone: [phone]   Fax:  [phone]  Name: TINYA CADOGAN MRN: [id number] Date of Birth: 02/06/1976

## 2017-08-19 ENCOUNTER — Encounter: Payer: Self-pay | Admitting: *Deleted

## 2017-08-19 ENCOUNTER — Inpatient Hospital Stay: Payer: BLUE CROSS/BLUE SHIELD

## 2017-08-19 ENCOUNTER — Ambulatory Visit: Payer: BLUE CROSS/BLUE SHIELD | Admitting: Hematology and Oncology

## 2017-08-19 ENCOUNTER — Ambulatory Visit: Payer: BLUE CROSS/BLUE SHIELD

## 2017-08-19 ENCOUNTER — Other Ambulatory Visit: Payer: BLUE CROSS/BLUE SHIELD

## 2017-08-19 ENCOUNTER — Inpatient Hospital Stay (HOSPITAL_BASED_OUTPATIENT_CLINIC_OR_DEPARTMENT_OTHER): Payer: BLUE CROSS/BLUE SHIELD | Admitting: Hematology and Oncology

## 2017-08-19 DIAGNOSIS — C50911 Malignant neoplasm of unspecified site of right female breast: Secondary | ICD-10-CM

## 2017-08-19 DIAGNOSIS — C50611 Malignant neoplasm of axillary tail of right female breast: Secondary | ICD-10-CM

## 2017-08-19 DIAGNOSIS — D709 Neutropenia, unspecified: Secondary | ICD-10-CM | POA: Diagnosis not present

## 2017-08-19 DIAGNOSIS — Z794 Long term (current) use of insulin: Secondary | ICD-10-CM | POA: Diagnosis not present

## 2017-08-19 DIAGNOSIS — C773 Secondary and unspecified malignant neoplasm of axilla and upper limb lymph nodes: Secondary | ICD-10-CM

## 2017-08-19 DIAGNOSIS — Z9641 Presence of insulin pump (external) (internal): Secondary | ICD-10-CM | POA: Diagnosis not present

## 2017-08-19 DIAGNOSIS — E119 Type 2 diabetes mellitus without complications: Secondary | ICD-10-CM

## 2017-08-19 DIAGNOSIS — Z170 Estrogen receptor positive status [ER+]: Secondary | ICD-10-CM

## 2017-08-19 DIAGNOSIS — Z5111 Encounter for antineoplastic chemotherapy: Secondary | ICD-10-CM | POA: Diagnosis not present

## 2017-08-19 DIAGNOSIS — R439 Unspecified disturbances of smell and taste: Secondary | ICD-10-CM | POA: Diagnosis not present

## 2017-08-19 DIAGNOSIS — Z5189 Encounter for other specified aftercare: Secondary | ICD-10-CM | POA: Diagnosis not present

## 2017-08-19 DIAGNOSIS — Z006 Encounter for examination for normal comparison and control in clinical research program: Secondary | ICD-10-CM

## 2017-08-19 LAB — COMPREHENSIVE METABOLIC PANEL
ALT: 15 U/L (ref 14–54)
ANION GAP: 10 (ref 5–15)
AST: 15 U/L (ref 15–41)
Albumin: 3.6 g/dL (ref 3.5–5.0)
Alkaline Phosphatase: 118 U/L (ref 38–126)
BUN: 21 mg/dL — ABNORMAL HIGH (ref 6–20)
CHLORIDE: 103 mmol/L (ref 101–111)
CO2: 27 mmol/L (ref 22–32)
CREATININE: 0.71 mg/dL (ref 0.44–1.00)
Calcium: 8.7 mg/dL — ABNORMAL LOW (ref 8.9–10.3)
Glucose, Bld: 183 mg/dL — ABNORMAL HIGH (ref 65–99)
Potassium: 4.2 mmol/L (ref 3.5–5.1)
SODIUM: 140 mmol/L (ref 135–145)
Total Bilirubin: 0.1 mg/dL — ABNORMAL LOW (ref 0.3–1.2)
Total Protein: 7 g/dL (ref 6.5–8.1)

## 2017-08-19 LAB — CBC WITH DIFFERENTIAL (CANCER CENTER ONLY)
Basophils Absolute: 0.1 10*3/uL (ref 0.0–0.1)
Basophils Relative: 1 %
EOS ABS: 0 10*3/uL (ref 0.0–0.5)
Eosinophils Relative: 0 %
HCT: 34.9 % (ref 34.8–46.6)
Hemoglobin: 11 g/dL — ABNORMAL LOW (ref 11.6–15.9)
LYMPHS ABS: 1.8 10*3/uL (ref 0.9–3.3)
LYMPHS PCT: 19 %
MCH: 26.3 pg (ref 25.1–34.0)
MCHC: 31.5 g/dL (ref 31.5–36.0)
MCV: 83.5 fL (ref 79.5–101.0)
MONO ABS: 0.8 10*3/uL (ref 0.1–0.9)
MONOS PCT: 8 %
Neutro Abs: 7 10*3/uL — ABNORMAL HIGH (ref 1.5–6.5)
Neutrophils Relative %: 72 %
PLATELETS: 208 10*3/uL (ref 145–400)
RBC: 4.18 MIL/uL (ref 3.70–5.45)
RDW: 17.2 % — ABNORMAL HIGH (ref 11.2–14.5)
WBC Count: 9.7 10*3/uL (ref 3.9–10.3)

## 2017-08-19 LAB — RESEARCH LABS

## 2017-08-19 MED ORDER — SODIUM CHLORIDE 0.9 % IV SOLN
Freq: Once | INTRAVENOUS | Status: AC
  Administered 2017-08-19: 12:00:00 via INTRAVENOUS

## 2017-08-19 MED ORDER — SODIUM CHLORIDE 0.9 % IV SOLN
600.0000 mg/m2 | Freq: Once | INTRAVENOUS | Status: AC
  Administered 2017-08-19: 1100 mg via INTRAVENOUS
  Filled 2017-08-19: qty 55

## 2017-08-19 MED ORDER — HEPARIN SOD (PORK) LOCK FLUSH 100 UNIT/ML IV SOLN
500.0000 [IU] | Freq: Once | INTRAVENOUS | Status: AC | PRN
  Administered 2017-08-19: 500 [IU]
  Filled 2017-08-19: qty 5

## 2017-08-19 MED ORDER — SODIUM CHLORIDE 0.9% FLUSH
10.0000 mL | INTRAVENOUS | Status: DC | PRN
  Administered 2017-08-19: 10 mL
  Filled 2017-08-19: qty 10

## 2017-08-19 MED ORDER — DOXORUBICIN HCL CHEMO IV INJECTION 2 MG/ML
60.0000 mg/m2 | Freq: Once | INTRAVENOUS | Status: AC
Start: 2017-08-19 — End: 2017-08-19
  Administered 2017-08-19: 110 mg via INTRAVENOUS
  Filled 2017-08-19: qty 55

## 2017-08-19 MED ORDER — PALONOSETRON HCL INJECTION 0.25 MG/5ML
0.2500 mg | Freq: Once | INTRAVENOUS | Status: AC
  Administered 2017-08-19: 0.25 mg via INTRAVENOUS

## 2017-08-19 MED ORDER — SODIUM CHLORIDE 0.9 % IV SOLN
Freq: Once | INTRAVENOUS | Status: AC
  Administered 2017-08-19: 12:00:00 via INTRAVENOUS
  Filled 2017-08-19: qty 5

## 2017-08-19 MED ORDER — PALONOSETRON HCL INJECTION 0.25 MG/5ML
INTRAVENOUS | Status: AC
  Filled 2017-08-19: qty 5

## 2017-08-19 NOTE — Progress Notes (Signed)
UPBEAT [facility] [id number] 1 MONTH BLOOD COLLECTION:  Met patient in lobby before her lab appointment this morning.  Reminded patient of research labs to be collected today for the UPBEAT study. Informed patient next Cardiac MRI and other study activities such as the Neurocognitive and Physical Functions testing will be due at the end of July.  We will wait to schedule a little closer to that time to try to coordinate with her other appointments.  Patient verbalized understanding.   Thanked patient very much for her ongoing participation in this study.  Foye Spurling, BSN, RN Clinical Research Nurse 08/19/2017 9:30 AM

## 2017-08-19 NOTE — Patient Instructions (Signed)
Cancer Center Discharge Instructions for Patients Receiving Chemotherapy  Today you received the following chemotherapy agents adriamycin/cytoxan  To help prevent nausea and vomiting after your treatment, we encourage you to take your nausea medication as directed  If you develop nausea and vomiting that is not controlled by your nausea medication, call the clinic.   BELOW ARE SYMPTOMS THAT SHOULD BE REPORTED IMMEDIATELY:  *FEVER GREATER THAN 100.5 F  *CHILLS WITH OR WITHOUT FEVER  NAUSEA AND VOMITING THAT IS NOT CONTROLLED WITH YOUR NAUSEA MEDICATION  *UNUSUAL SHORTNESS OF BREATH  *UNUSUAL BRUISING OR BLEEDING  TENDERNESS IN MOUTH AND THROAT WITH OR WITHOUT PRESENCE OF ULCERS  *URINARY PROBLEMS  *BOWEL PROBLEMS  UNUSUAL RASH Items with * indicate a potential emergency and should be followed up as soon as possible.  Feel free to call the clinic you have any questions or concerns. The clinic phone number is [phone].  

## 2017-08-19 NOTE — Assessment & Plan Note (Signed)
06/05/2017:Bilateral mastectomies: Right mastectomy: IDC grade 1, 3 foci 1.2 cm, 1.2 cm, 1 cm, IgA DCIS, LVH diffuse present, margins negative, 4/7 lymph nodes positive with extracapsular extension, ER 95%, PR 95%, HER-2 negative, Ki-67 2% to 10%, T1 cN2 aM0 stage II a pathological stage; left mastectomy: Benign  Mammaprint done preoperatively: Low risk Axillary lymph node dissection 06/26/2017: 2/22 lymph nodes positive(making a total of 6 lymph nodes that were positive)  Treatment plan: 1.Because ofher high risk features (6 LN Pos),werecommended adjuvant systemic chemotherapywithdose dense Adriamycin and Cytoxan x4 followed by Taxol weekly x12 started 07/22/2017 3.Followed by radiation.  4.Followed by adjuvant antiestrogen therapy  UPBEAT clinical trial ([facility] [id number]):No toxicities related to the trial --------------------------------------------------------------------------- Current treatment: Cycle 3 day1dose dense Adriamycin and Cytoxan Labs reviewed Echocardiogram 06/20/2017: EF 55 to 60%  Chemo toxicities: 1.  Neutropenia: We will decrease the dosage of cycle 2 chemotherapy 2. discontinue dexamethasone because of diabetes  Patient does not want to wear any wig  Diabetes mellitus: Patient has an insulin pump.  Monitoring blood sugars closely   Return to clinic in 2 weeks forcycle 4

## 2017-08-19 NOTE — Progress Notes (Signed)
Patient Care Team: Tisovec, Fransico Him, MD as PCP - General (Internal Medicine)  DIAGNOSIS:  Encounter Diagnosis  Name Primary?  . Breast cancer metastasized to axillary lymph node, right ([place])     SUMMARY OF ONCOLOGIC HISTORY:   Malignant neoplasm of axillary tail of right breast in female, estrogen receptor positive ([place])   05/09/2017 Initial Diagnosis    May 2018: Palpable right axillary mass.  The recommended a 6-week follow-up but apparently the mass decreased in size.  Recent increase in the mass was noted, biopsy revealed Grade 1 IDC with DCIS, lymphovascular invasion present, 4 small hypoechoic masses largest 0.9 cm, T1BN1 stage Ib clinical stage      06/05/2017 Surgery    Bilateral mastectomies: Right mastectomy: IDC grade 1, 3 foci 1.2 cm, 1.2 cm, 1 cm, IgA DCIS, LVH diffuse present, margins negative, 4/7 lymph nodes positive with extracapsular extension, ER 95%, PR 95%, HER-2 negative, Ki-67 2% to 10%, T1 cN2 aM0 stage II a pathological stage; left mastectomy: Benign      06/26/2017 Surgery    Axillary lymph node dissection: 2/22 lymph nodes positive (making a total of 6+ lymph nodes)      07/22/2017 -  Chemotherapy    Adjuvant chemotherapy with dose dense Adriamycin and Cytoxan x4 followed by Taxol weekly x12        Breast cancer metastasized to axillary lymph node, right ([place])   06/05/2017 Initial Diagnosis    Breast cancer metastasized to axillary lymph node, right ([place])      07/22/2017 -  Chemotherapy    Adjuvant chemotherapy with dose dense Adriamycin and Cytoxan x4 followed by Taxol weekly x12        CHIEF COMPLIANT: Cycle 3 dose dense Adriamycin and Cytoxan  INTERVAL HISTORY: Stephanie Holden is a 42 year old with above-mentioned history of right breast cancer currently on adjuvant chemotherapy and today is cycle 3 of dose dense Adriamycin and Cytoxan.  She is tolerating chemotherapy extremely well.  Does not have any nausea or vomiting.  REVIEW OF SYSTEMS:     Constitutional: Denies fevers, chills or abnormal weight loss Eyes: Denies blurriness of vision Ears, nose, mouth, throat, and face: Denies mucositis or sore throat Respiratory: Denies cough, dyspnea or wheezes Cardiovascular: Denies palpitation, chest discomfort Gastrointestinal:  Denies nausea, heartburn or change in bowel habits Skin: Denies abnormal skin rashes Lymphatics: Denies new lymphadenopathy or easy bruising Neurological:Denies numbness, tingling or new weaknesses Behavioral/Psych: Mood is stable, no new changes  Extremities: No lower extremity edema Breast: Bilateral mastectomies All other systems were reviewed with the patient and are negative.  I have reviewed the past medical history, past surgical history, social history and family history with the patient and they are unchanged from previous note.  ALLERGIES:  is allergic to dilaudid [hydromorphone hcl].  MEDICATIONS:  Current Outpatient Medications  Medication Sig Dispense Refill  . acetaminophen (TYLENOL) 500 MG tablet Take 1,000 mg by mouth every 6 (six) hours as needed for moderate pain or headache.    . dexamethasone (DECADRON) 2 MG tablet Take 1 tablet day after chemotherapy and 1 tablet 2 days after with food. (Patient not taking: Reported on 07/09/2017) 10 tablet 0  . gabapentin (NEURONTIN) 300 MG capsule Take 1 capsule (300 mg total) by mouth at bedtime. 30 capsule 0  . insulin aspart (NOVOLOG) 100 UNIT/ML injection Inject 45 Units into the skin See admin instructions. Use via insulin pump up to 45 units daily    . lidocaine-prilocaine (EMLA) cream Apply to  affected area once (Patient not taking: Reported on 07/09/2017) 30 g 3  . LORazepam (ATIVAN) 0.5 MG tablet Take 1 tablet (0.5 mg total) by mouth every 6 (six) hours as needed for anxiety or sleep. 30 tablet 3  . magic mouthwash w/lidocaine SOLN Take 5 mLs by mouth 4 (four) times daily as needed for mouth pain. Swish, swallow, or spit 240 mL 1  . methocarbamol  (ROBAXIN) 500 MG tablet Take 1 tablet (500 mg total) by mouth every 8 (eight) hours as needed for muscle spasms. 30 tablet 1  . ondansetron (ZOFRAN) 8 MG tablet Take 1 tablet (8 mg total) by mouth 2 (two) times daily as needed. Start on the third day after chemotherapy. (Patient not taking: Reported on 07/09/2017) 30 tablet 1  . Probiotic Product (PROBIOTIC DAILY PO) Take 1 capsule by mouth at bedtime.     . prochlorperazine (COMPAZINE) 10 MG tablet Take 1 tablet (10 mg total) by mouth every 6 (six) hours as needed (Nausea or vomiting). (Patient not taking: Reported on 07/09/2017) 30 tablet 1  . tamoxifen (NOLVADEX) 20 MG tablet TAKE 1 TABLET BY MOUTH EVERY DAY 30 tablet 0  . valACYclovir (VALTREX) 500 MG tablet Take 1 tablet (500 mg total) by mouth 2 (two) times daily. 60 tablet 3  . venlafaxine XR (EFFEXOR-XR) 37.5 MG 24 hr capsule Take 1 capsule (37.5 mg total) by mouth daily with breakfast. (Patient taking differently: Take 37.5 mg by mouth at bedtime. ) 30 capsule 3   No current facility-administered medications for this visit.     PHYSICAL EXAMINATION: ECOG PERFORMANCE STATUS: 1 - Symptomatic but completely ambulatory  Vitals:   08/19/17 1000  BP: 137/85  Pulse: 83  Resp: 18  Temp: 98.6 F (37 C)  SpO2: 99%   Filed Weights   08/19/17 1000  Weight: 168 lb 9.6 oz (76.5 kg)    GENERAL:alert, no distress and comfortable SKIN: skin color, texture, turgor are normal, no rashes or significant lesions EYES: normal, Conjunctiva are pink and non-injected, sclera clear OROPHARYNX:no exudate, no erythema and lips, buccal mucosa, and tongue normal  NECK: supple, thyroid normal size, non-tender, without nodularity LYMPH:  no palpable lymphadenopathy in the cervical, axillary or inguinal LUNGS: clear to auscultation and percussion with normal breathing effort HEART: regular rate & rhythm and no murmurs and no lower extremity edema ABDOMEN:abdomen soft, non-tender and normal bowel  sounds MUSCULOSKELETAL:no cyanosis of digits and no clubbing  NEURO: alert & oriented x 3 with fluent speech, no focal motor/sensory deficits EXTREMITIES: No lower extremity edema  LABORATORY DATA:  I have reviewed the data as listed CMP Latest Ref Rng & Units 08/05/2017 07/29/2017 07/22/2017  Glucose 70 - 140 mg/dL 166(H) 115 218(H)  BUN 7 - 26 mg/dL '14 12 12  ' Creatinine 0.60 - 1.10 mg/dL 0.81 0.75 0.88  Sodium 136 - 145 mmol/L 139 138 139  Potassium 3.5 - 5.1 mmol/L 3.9 4.1 3.8  Chloride 98 - 109 mmol/L 104 104 103  CO2 22 - 29 mmol/L '27 29 28  ' Calcium 8.4 - 10.4 mg/dL 8.7 8.4 8.9  Total Protein 6.4 - 8.3 g/dL 6.9 6.3(L) 7.3  Total Bilirubin 0.2 - 1.2 mg/dL <0.2(L) 0.3 0.4  Alkaline Phos 40 - 150 U/L 111 89 78  AST 5 - 34 U/L '13 11 17  ' ALT 0 - 55 U/L '16 11 13    ' Lab Results  Component Value Date   WBC 9.7 08/19/2017   HGB 11.0 (L) 08/19/2017   HCT  34.9 08/19/2017   MCV 83.5 08/19/2017   PLT 208 08/19/2017   NEUTROABS 7.0 (H) 08/19/2017    ASSESSMENT & PLAN:  Breast cancer metastasized to axillary lymph node, right ([place]) 06/05/2017:Bilateral mastectomies: Right mastectomy: IDC grade 1, 3 foci 1.2 cm, 1.2 cm, 1 cm, IgA DCIS, LVH diffuse present, margins negative, 4/7 lymph nodes positive with extracapsular extension, ER 95%, PR 95%, HER-2 negative, Ki-67 2% to 10%, T1 cN2 aM0 stage II a pathological stage; left mastectomy: Benign  Mammaprint done preoperatively: Low risk Axillary lymph node dissection 06/26/2017: 2/22 lymph nodes positive(making a total of 6 lymph nodes that were positive)  Treatment plan: 1.Because ofher high risk features (6 LN Pos),werecommended adjuvant systemic chemotherapywithdose dense Adriamycin and Cytoxan x4 followed by Taxol weekly x12 started 07/22/2017 3.Followed by radiation.  4.Followed by adjuvant antiestrogen therapy  UPBEAT clinical trial ([id number]):No toxicities related to the  trial --------------------------------------------------------------------------- Current treatment: Cycle 3 day1dose dense Adriamycin and Cytoxan Labs reviewed Echocardiogram 06/20/2017: EF 55 to 60%  Chemo toxicities: 1.  Neutropenia: We will decrease the dosage of cycle 2 chemotherapy 2. discontinue dexamethasone because of diabetes  3.  Dec Taste: Shes been eating Fried chicken a lot.  Decreased taste: Patient has a taste for patient does not want to wear any wig  Diabetes mellitus: Patient has an insulin pump.  Monitoring blood sugars closely  Patient plans to go to the beach first week of July.  Return to clinic in 2 weeks forcycle 4      Orders Placed This Encounter  Procedures  . Comprehensive metabolic panel   The patient has a good understanding of the overall plan. she agrees with it. she will call with any problems that may develop before the next visit here.   Harriette Ohara, MD 08/19/17

## 2017-08-19 NOTE — Telephone Encounter (Signed)
Spoke with patient & she stated that she was in treatment when you had called her a couple of weeks ago & she was sorry she missed your call. Pt states they do blood work before her treatments & is not sure if they check her vitamin d or not which is why she cancelled her lab appt here but that she will ask Dr Lindi Adie & find out. Routing to Johny Shock, CNM for review.

## 2017-08-21 ENCOUNTER — Ambulatory Visit: Payer: BLUE CROSS/BLUE SHIELD

## 2017-08-21 ENCOUNTER — Inpatient Hospital Stay: Payer: BLUE CROSS/BLUE SHIELD

## 2017-08-21 ENCOUNTER — Telehealth: Payer: Self-pay | Admitting: Hematology and Oncology

## 2017-08-21 VITALS — BP 135/80 | HR 90 | Temp 98.6°F | Resp 20

## 2017-08-21 DIAGNOSIS — C50611 Malignant neoplasm of axillary tail of right female breast: Secondary | ICD-10-CM

## 2017-08-21 DIAGNOSIS — Z483 Aftercare following surgery for neoplasm: Secondary | ICD-10-CM

## 2017-08-21 DIAGNOSIS — Z170 Estrogen receptor positive status [ER+]: Secondary | ICD-10-CM

## 2017-08-21 DIAGNOSIS — M79621 Pain in right upper arm: Secondary | ICD-10-CM

## 2017-08-21 DIAGNOSIS — R293 Abnormal posture: Secondary | ICD-10-CM | POA: Diagnosis not present

## 2017-08-21 DIAGNOSIS — E119 Type 2 diabetes mellitus without complications: Secondary | ICD-10-CM | POA: Diagnosis not present

## 2017-08-21 DIAGNOSIS — M25612 Stiffness of left shoulder, not elsewhere classified: Secondary | ICD-10-CM

## 2017-08-21 DIAGNOSIS — Z5111 Encounter for antineoplastic chemotherapy: Secondary | ICD-10-CM | POA: Diagnosis not present

## 2017-08-21 DIAGNOSIS — M25611 Stiffness of right shoulder, not elsewhere classified: Secondary | ICD-10-CM

## 2017-08-21 DIAGNOSIS — C50911 Malignant neoplasm of unspecified site of right female breast: Secondary | ICD-10-CM

## 2017-08-21 DIAGNOSIS — M79622 Pain in left upper arm: Secondary | ICD-10-CM | POA: Diagnosis not present

## 2017-08-21 DIAGNOSIS — C773 Secondary and unspecified malignant neoplasm of axilla and upper limb lymph nodes: Principal | ICD-10-CM

## 2017-08-21 DIAGNOSIS — Z5189 Encounter for other specified aftercare: Secondary | ICD-10-CM | POA: Diagnosis not present

## 2017-08-21 DIAGNOSIS — Z794 Long term (current) use of insulin: Secondary | ICD-10-CM | POA: Diagnosis not present

## 2017-08-21 MED ORDER — PEGFILGRASTIM-CBQV 6 MG/0.6ML ~~LOC~~ SOSY
PREFILLED_SYRINGE | SUBCUTANEOUS | Status: AC
  Filled 2017-08-21: qty 0.6

## 2017-08-21 MED ORDER — PEGFILGRASTIM-CBQV 6 MG/0.6ML ~~LOC~~ SOSY
6.0000 mg | PREFILLED_SYRINGE | Freq: Once | SUBCUTANEOUS | Status: AC
  Administered 2017-08-21: 6 mg via SUBCUTANEOUS

## 2017-08-21 NOTE — Patient Instructions (Signed)
Pegfilgrastim injection What is this medicine? PEGFILGRASTIM (PEG fil gra stim) is a long-acting granulocyte colony-stimulating factor that stimulates the growth of neutrophils, a type of white blood cell important in the body's fight against infection. It is used to reduce the incidence of fever and infection in patients with certain types of cancer who are receiving chemotherapy that affects the bone marrow, and to increase survival after being exposed to high doses of radiation. This medicine may be used for other purposes; ask your health care provider or pharmacist if you have questions. COMMON BRAND NAME(S): Neulasta What should I tell my health care provider before I take this medicine? They need to know if you have any of these conditions: -kidney disease -latex allergy -ongoing radiation therapy -sickle cell disease -skin reactions to acrylic adhesives (On-Body Injector only) -an unusual or allergic reaction to pegfilgrastim, filgrastim, other medicines, foods, dyes, or preservatives -pregnant or trying to get pregnant -breast-feeding How should I use this medicine? This medicine is for injection under the skin. If you get this medicine at home, you will be taught how to prepare and give the pre-filled syringe or how to use the On-body Injector. Refer to the patient Instructions for Use for detailed instructions. Use exactly as directed. Tell your healthcare provider immediately if you suspect that the On-body Injector may not have performed as intended or if you suspect the use of the On-body Injector resulted in a missed or partial dose. It is important that you put your used needles and syringes in a special sharps container. Do not put them in a trash can. If you do not have a sharps container, call your pharmacist or healthcare provider to get one. Talk to your pediatrician regarding the use of this medicine in children. While this drug may be prescribed for selected conditions,  precautions do apply. Overdosage: If you think you have taken too much of this medicine contact a poison control center or emergency room at once. NOTE: This medicine is only for you. Do not share this medicine with others. What if I miss a dose? It is important not to miss your dose. Call your doctor or health care professional if you miss your dose. If you miss a dose due to an On-body Injector failure or leakage, a new dose should be administered as soon as possible using a single prefilled syringe for manual use. What may interact with this medicine? Interactions have not been studied. Give your health care provider a list of all the medicines, herbs, non-prescription drugs, or dietary supplements you use. Also tell them if you smoke, drink alcohol, or use illegal drugs. Some items may interact with your medicine. This list may not describe all possible interactions. Give your health care provider a list of all the medicines, herbs, non-prescription drugs, or dietary supplements you use. Also tell them if you smoke, drink alcohol, or use illegal drugs. Some items may interact with your medicine. What should I watch for while using this medicine? You may need blood work done while you are taking this medicine. If you are going to need a MRI, CT scan, or other procedure, tell your doctor that you are using this medicine (On-Body Injector only). What side effects may I notice from receiving this medicine? Side effects that you should report to your doctor or health care professional as soon as possible: -allergic reactions like skin rash, itching or hives, swelling of the face, lips, or tongue -dizziness -fever -pain, redness, or irritation at site   where injected -pinpoint red spots on the skin -red or dark-brown urine -shortness of breath or breathing problems -stomach or side pain, or pain at the shoulder -swelling -tiredness -trouble passing urine or change in the amount of urine Side  effects that usually do not require medical attention (report to your doctor or health care professional if they continue or are bothersome): -bone pain -muscle pain This list may not describe all possible side effects. Call your doctor for medical advice about side effects. You may report side effects to FDA at 1-800-FDA-1088. Where should I keep my medicine? Keep out of the reach of children. Store pre-filled syringes in a refrigerator between 2 and 8 degrees C (36 and 46 degrees F). Do not freeze. Keep in carton to protect from light. Throw away this medicine if it is left out of the refrigerator for more than 48 hours. Throw away any unused medicine after the expiration date. NOTE: This sheet is a summary. It may not cover all possible information. If you have questions about this medicine, talk to your doctor, pharmacist, or health care provider.  2018 Elsevier/Gold Standard (2016-03-07 12:58:03)  

## 2017-08-21 NOTE — Telephone Encounter (Signed)
Spoke to patient regarding upcoming appointments per 5/28 los.

## 2017-08-21 NOTE — Therapy (Addendum)
[place], [place], [address] Phone: [phone]   Fax:  [phone]  Physical Therapy Treatment  Patient Details  Name: Stephanie Holden MRN: [id number] Date of Birth: November 07, 1975 Referring Provider: Dr. [name]   Encounter Date: 08/21/2017  PT End of Session - 08/21/17 1206    Visit Number  10    Number of Visits  20    Date for PT Re-Evaluation  09/18/17    PT Start Time  1103    PT Stop Time  1159    PT Time Calculation (min)  56 min    Activity Tolerance  Patient tolerated treatment well    Behavior During Therapy  [facility] for tasks assessed/performed       Past Medical History:  Diagnosis Date  . Cancer [facility])    Breast cancer - right  . Constipation   . Depression   . Genetic testing 05/27/2017   Breast/GYN panel (23 genes) @ [facility] - No pathogenic mutations detected  . GERD (gastroesophageal reflux disease)   . Insulin dependent type 1 diabetes mellitus ([place])   . Insulin pump in place   . PONV (postoperative nausea and vomiting)   . Stenosing tenosynovitis of thumb 06/2014   right  . Thyroid goiter     Past Surgical History:  Procedure Laterality Date  . AXILLARY LYMPH NODE DISSECTION Right 06/26/2017   Procedure: AXILLARY LYMPH NODE DISSECTION;  Surgeon: [name], MD;  Location: [place];  Service: General;  Laterality: Right;  . CARPAL TUNNEL RELEASE Bilateral 10/28/2013   Procedure: BILATERAL CARPAL TUNNEL RELEASE;  Surgeon: Daryll Brod, MD;  Location: [place];  Service: Orthopedics;  Laterality: Bilateral;  . CESAREAN SECTION  2000; 08/20/2001  . ESSURE TUBAL LIGATION  2005   failed, tubal puncture  . LAPAROSCOPIC TUBAL LIGATION  04/02/2007   removal Essure  . LAPAROSCOPIC UNILATERAL SALPINGECTOMY  2005  . MASTECTOMY W/ SENTINEL NODE BIOPSY Bilateral 06/05/2017   Procedure: BILATERAL MASTECTOMIES  WITH RIGHT SENTINEL LYMPH NODE BIOPSY;  Surgeon: [name], MD;   Location: [place];  Service: General;  Laterality: Bilateral;  . PORTACATH PLACEMENT Left 06/26/2017   Procedure: INSERTION PORT-A-CATH;  Surgeon: [name], MD;  Location: [place];  Service: General;  Laterality: Left;  . TRIGGER FINGER RELEASE Left 04/12/2014   Procedure: RELEASE A-1 PULLEY LEFT THUMB;  Surgeon: Daryll Brod, MD;  Location: [place];  Service: Orthopedics;  Laterality: Left;  . TRIGGER FINGER RELEASE Right 07/19/2014   Procedure: RELEASE TRIGGER FINGER/A-1 PULLEY RIGHT THUMB;  Surgeon: Daryll Brod, MD;  Location: [place];  Service: Orthopedics;  Laterality: Right;    There were no vitals filed for this visit.  Subjective Assessment - 08/21/17 1108    Subjective  I started taking Vitamin D today because my gynocologist awhile ago had told me it was low and I just forgot about it.     Pertinent History  Patient was diagnosed on 05/08/17 with right invasive ductal carcinoma breast cancer. She was seen in 5/18 and found to have a right axillary mass but did not f/u for further testing. She then had a mammogram in 2/19 and was diagnosed with right breast cancer which had metastasized to the axillary lymph nodes. She had multiple small masses in the lower outer quadrant of her right breast with the largest mass measuring 1.3 cm. It is ER/PR positive and HER2 negative with a Ki67 of 2%. Patient underwent  a bilateral mastectomy with right targeted node dissection with 4/7 positive on 06/05/17 for right invasive ductal carcinoma. She then underwent a right axillary node dissection on 06/26/17 with 2/20 positive nodes. She also had a port-a-cath placed on 06/26/17. She currently has 3 drains in place and will see her surgeon on 07/17/17. The plan is chemotherapy, radiation, and anti-estrogen therapy.     Patient Stated Goals  Try to get arm moving and reduce lymphedema risk    Currently in Pain?  No/denies         [facility] PT Assessment - 08/21/17 0001      AROM    Right Shoulder Extension  68 Degrees    Right Shoulder Flexion  153 Degrees    Right Shoulder ABduction  157 Degrees    Left Shoulder Extension  54 Degrees    Left Shoulder Flexion  159 Degrees    Left Shoulder ABduction  153 Degrees    Left Shoulder Internal Rotation  61 Degrees        LYMPHEDEMA/ONCOLOGY QUESTIONNAIRE - 08/21/17 1132      Right Upper Extremity Lymphedema   10 cm Proximal to Olecranon Process  34.4 cm    Olecranon Process  25.2 cm    10 cm Proximal to Ulnar Styloid Process  22.6 cm    Just Proximal to Ulnar Styloid Process  14.3 cm    Across Hand at [employer]  17.6 cm    At [place] of 2nd Digit  6 cm      Left Upper Extremity Lymphedema   10 cm Proximal to Olecranon Process  33.4 cm    Olecranon Process  24.6 cm    10 cm Proximal to Ulnar Styloid Process  22.2 cm    Just Proximal to Ulnar Styloid Process  14.5 cm    Across Hand at [employer]  17.5 cm    At [place] of 2nd Digit  6 cm                [facility] Adult PT Treatment/Exercise - 08/21/17 0001      Shoulder Exercises: Standing   Other Standing Exercises  3 way raises with back and head against wall (flexion, scaption, and abduction) 10 times each with 2 lbs      Shoulder Exercises: Pulleys   Flexion  2 minutes    ABduction  2 minutes      Shoulder Exercises: Therapy Ball   Flexion  Both;15 reps With forward lean into end of stretch; 1 lb on wrists    ABduction  Right;Left;10 reps Same side lean into end of stretch; 1 lb on wrists      Manual Therapy   Manual Therapy  Taping;Manual Lymphatic Drainage (MLD);Passive ROM    Manual Lymphatic Drainage (MLD)  In Supine: Short neck, 5 diaphragmatic breaths, Rt inguinal nodes and Rt axillo-inguinal anastomosis, then Rt UE from lateral upper arm to dorsal hand working proximal to distal then retracing all steps due to increase circumference with measurements today.    Passive ROM  PROM to LT shoulders in supine briefly into flexion and abduction.     Kinesiotex  Edema      Kinesiotix   Edema  Skin cote applied then 3 finger shaped along Lt axillo-inguinal anastomosis up to inferior to incision                  PT Long Term Goals - 08/21/17 1124      PT LONG  TERM GOAL #1   Title  Patient will demonstrate she has returned to baseline related to shoulder ROM and function.    Status  Achieved      PT LONG TERM GOAL #2   Title  Patient will increase bilateral shoulder flexion AROM to >/= 140 degrees for increased ease reaching overhead.    Baseline  Rt 153 and Lt 159 degrees-08/21/17    Status  Achieved      PT LONG TERM GOAL #3   Title  Patient will increase bilateral shoulder abduction AROM to >/= 140 degrees for increased ease reaching overhead.    Baseline  Rt 157 and Lt 153 degrees-08/21/17      PT LONG TERM GOAL #4   Title  Patient will report she has been able to return to >/= 75% of normal daily tasks.    Baseline  100% return to daily tasks, doesn't feel limited-08/21/17    Status  Achieved      PT LONG TERM GOAL #5   Title  Patient is able to verbalize understanding of where and how to be fitted for a compression sleeve for flying and also risk reduction practices for lymphedema.    Baseline  Pt has recieved these-08/21/17    Status  Achieved      Additional Long Term Goals   Additional Long Term Goals  Yes      PT LONG TERM GOAL #6   Title  Patient will be independent with performing self manual lymph drainage to reduce swelling.    Time  4    Period  Weeks    Status  New      PT LONG TERM GOAL #7   Title  Patient will be independent with a safe self progression of her home exercise program.    Time  4    Period  Weeks    Status  New      Breast Clinic Goals - 05/15/17 0803      Patient will be able to verbalize understanding of pertinent lymphedema risk reduction practices relevant to her diagnosis specifically related to skin care.   Time  1    Period  Days    Status  Achieved      Patient  will be able to return demonstrate and/or verbalize understanding of the post-op home exercise program related to regaining shoulder range of motion.   Time  1    Period  Days    Status  Achieved      Patient will be able to verbalize understanding of the importance of attending the postoperative After Breast Cancer Class for further lymphedema risk reduction education and therapeutic exercise.   Time  1    Period  Days    Status  Achieved           Plan - 08/21/17 1207    Clinical Impression Statement  Pt is progressing well with therapy and making good progress towards goals having met some. Pt does still present with fairly significant myofascial tightness at bil chest and now has increased circumference at Rt UE indicating beginning stages of lymphedema (29 nodes removed here). Pt will benefit from continuing therapy to help further reduce the myofascial tightness that does limit her end ROMa nd instruct pt in self manual lymph drainage. Also to progress her HEP with strengthening exercises.     Rehab Potential  Excellent    Clinical Impairments Affecting Rehab Potential  Recent surgery with extensive axillary dissection  PT Frequency  2x / week    PT Duration  4 weeks    PT Treatment/Interventions  ADLs/Self Care Home Management;Therapeutic activities;Therapeutic exercise;Patient/family education;Manual techniques;Manual lymph drainage;Scar mobilization;Passive range of motion    PT Next Visit Plan  Renewal done today. Cont with myofascial release to Lt>Rt chest walls, kinesiotape to Lt axillo-inguinal anastomosis and instructing her in this; performing and instructing her in MLD for Rt UE and finalizing her HEP for strengthening exercises.     Consulted and Agree with Plan of Care  Patient       Patient will benefit from skilled therapeutic intervention in order to improve the following deficits and impairments:  Pain, Decreased scar mobility, Decreased range of motion, Impaired  UE functional use, Postural dysfunction, Decreased knowledge of precautions, Increased fascial restricitons, Decreased activity tolerance, Decreased knowledge of use of DME, Increased edema  Visit Diagnosis: Abnormal posture  Aftercare following surgery for neoplasm  Stiffness of left shoulder, not elsewhere classified  Stiffness of right shoulder, not elsewhere classified  Pain in left upper arm  Pain in right upper arm  Malignant neoplasm of axillary tail of right breast in female, estrogen receptor positive ([place])     Problem List Patient Active Problem List   Diagnosis Date Noted  . Breast cancer metastasized to axillary lymph node, right ([place]) 06/05/2017  . Genetic testing 05/27/2017  . Malignant neoplasm of axillary tail of right breast in female, estrogen receptor positive ([place]) 05/09/2017  . Cubital tunnel syndrome on left 10/02/2015  . Trigger finger, left index finger 04/12/2015  . Erroneous encounter - disregard 09/22/2014  . Diabetes type 1, controlled ([place]) 01/12/2013    SMITH,MARTI COOPER, PT 08/21/2017, 12:43 PM  [place] [facility] [place], [place], [address] Phone: [phone]   Fax:  [phone]  Name: Stephanie Holden MRN: [id number] Date of Birth: 08-15-1975

## 2017-08-25 ENCOUNTER — Ambulatory Visit: Payer: BLUE CROSS/BLUE SHIELD | Attending: General Surgery | Admitting: Physical Therapy

## 2017-08-25 ENCOUNTER — Encounter: Payer: Self-pay | Admitting: Physical Therapy

## 2017-08-25 ENCOUNTER — Other Ambulatory Visit: Payer: Self-pay

## 2017-08-25 DIAGNOSIS — C50611 Malignant neoplasm of axillary tail of right female breast: Secondary | ICD-10-CM | POA: Insufficient documentation

## 2017-08-25 DIAGNOSIS — M79621 Pain in right upper arm: Secondary | ICD-10-CM | POA: Diagnosis present

## 2017-08-25 DIAGNOSIS — M25612 Stiffness of left shoulder, not elsewhere classified: Secondary | ICD-10-CM

## 2017-08-25 DIAGNOSIS — M25611 Stiffness of right shoulder, not elsewhere classified: Secondary | ICD-10-CM | POA: Diagnosis not present

## 2017-08-25 DIAGNOSIS — Z170 Estrogen receptor positive status [ER+]: Secondary | ICD-10-CM

## 2017-08-25 DIAGNOSIS — R293 Abnormal posture: Secondary | ICD-10-CM | POA: Diagnosis not present

## 2017-08-25 DIAGNOSIS — R600 Localized edema: Secondary | ICD-10-CM | POA: Insufficient documentation

## 2017-08-25 DIAGNOSIS — M79622 Pain in left upper arm: Secondary | ICD-10-CM

## 2017-08-25 DIAGNOSIS — Z483 Aftercare following surgery for neoplasm: Secondary | ICD-10-CM

## 2017-08-25 NOTE — Therapy (Signed)
[place], [place], [address] Phone: [phone]   Fax:  [phone]  Physical Therapy Treatment  Patient Details  Name: Stephanie Holden MRN: [id number] Date of Birth: [birth year] Referring Provider: Dr. [name]   Encounter Date: 08/25/2017  PT End of Session - 08/25/17 1149    Visit Number  11    Number of Visits  20    Date for PT Re-Evaluation  09/18/17    PT Start Time  1103    PT Stop Time  1153    PT Time Calculation (min)  50 min    Activity Tolerance  Patient tolerated treatment well    Behavior During Therapy  [facility] for tasks assessed/performed       Past Medical History:  Diagnosis Date  . Cancer [facility])    Breast cancer - right  . Constipation   . Depression   . Genetic testing 05/27/2017   Breast/GYN panel (23 genes) @ [facility] - No pathogenic mutations detected  . GERD (gastroesophageal reflux disease)   . Insulin dependent type 1 diabetes mellitus ([place])   . Insulin pump in place   . PONV (postoperative nausea and vomiting)   . Stenosing tenosynovitis of thumb 06/2014   right  . Thyroid goiter     Past Surgical History:  Procedure Laterality Date  . AXILLARY LYMPH NODE DISSECTION Right 06/26/2017   Procedure: AXILLARY LYMPH NODE DISSECTION;  Surgeon: [name], MD;  Location: [place];  Service: General;  Laterality: Right;  . CARPAL TUNNEL RELEASE Bilateral 10/28/2013   Procedure: BILATERAL CARPAL TUNNEL RELEASE;  Surgeon: Daryll Brod, MD;  Location: [place];  Service: Orthopedics;  Laterality: Bilateral;  . CESAREAN SECTION  2000; 08/20/2001  . ESSURE TUBAL LIGATION  2005   failed, tubal puncture  . LAPAROSCOPIC TUBAL LIGATION  04/02/2007   removal Essure  . LAPAROSCOPIC UNILATERAL SALPINGECTOMY  2005  . MASTECTOMY W/ SENTINEL NODE BIOPSY Bilateral 06/05/2017   Procedure: BILATERAL MASTECTOMIES  WITH RIGHT SENTINEL LYMPH NODE BIOPSY;  Surgeon: [name], MD;   Location: [place];  Service: General;  Laterality: Bilateral;  . PORTACATH PLACEMENT Left 06/26/2017   Procedure: INSERTION PORT-A-CATH;  Surgeon: [name], MD;  Location: [place];  Service: General;  Laterality: Left;  . TRIGGER FINGER RELEASE Left 04/12/2014   Procedure: RELEASE A-1 PULLEY LEFT THUMB;  Surgeon: Daryll Brod, MD;  Location: [place];  Service: Orthopedics;  Laterality: Left;  . TRIGGER FINGER RELEASE Right 07/19/2014   Procedure: RELEASE TRIGGER FINGER/A-1 PULLEY RIGHT THUMB;  Surgeon: Daryll Brod, MD;  Location: [place];  Service: Orthopedics;  Laterality: Right;    There were no vitals filed for this visit.  Subjective Assessment - 08/25/17 1107    Subjective  I feel really crappy today and I'm so tired of being tired. I have loose stools with mucous today.    Pertinent History  Patient was diagnosed on 05/08/17 with right invasive ductal carcinoma breast cancer. She was seen in 5/18 and found to have a right axillary mass but did not f/u for further testing. She then had a mammogram in 2/19 and was diagnosed with right breast cancer which had metastasized to the axillary lymph nodes. She had multiple small masses in the lower outer quadrant of her right breast with the largest mass measuring 1.3 cm. It is ER/PR positive and HER2 negative with a Ki67 of 2%. Patient underwent a bilateral mastectomy with right  targeted node dissection with 4/7 positive on 06/05/17 for right invasive ductal carcinoma. She then underwent a right axillary node dissection on 06/26/17 with 2/20 positive nodes. She also had a port-a-cath placed on 06/26/17. She currently has 3 drains in place and will see her surgeon on 07/17/17. The plan is chemotherapy, radiation, and anti-estrogen therapy.     Currently in Pain?  No/denies                       [facility] Adult PT Treatment/Exercise - 08/25/17 0001      Shoulder Exercises: Pulleys   Flexion  2 minutes    ABduction   2 minutes    ABduction Limitations  Done after manual therapy to achieve full ROM      Manual Therapy   Manual Therapy  Manual Lymphatic Drainage (MLD);Passive ROM    Manual Lymphatic Drainage (MLD)  In Supine: Short neck, 5 diaphragmatic breaths, Rt inguinal nodes and Rt axillo-inguinal anastomosis, then Rt UE from lateral upper arm to dorsal hand working proximal to distal then retracing all steps    Passive ROM  PROM to LT shoulders in supine briefly into flexion and abduction.                  PT Long Term Goals - 08/21/17 1124      PT LONG TERM GOAL #1   Title  Patient will demonstrate she has returned to baseline related to shoulder ROM and function.    Status  Achieved      PT LONG TERM GOAL #2   Title  Patient will increase bilateral shoulder flexion AROM to >/= 140 degrees for increased ease reaching overhead.    Baseline  Rt 153 and Lt 159 degrees-08/21/17    Status  Achieved      PT LONG TERM GOAL #3   Title  Patient will increase bilateral shoulder abduction AROM to >/= 140 degrees for increased ease reaching overhead.    Baseline  Rt 157 and Lt 153 degrees-08/21/17      PT LONG TERM GOAL #4   Title  Patient will report she has been able to return to >/= 75% of normal daily tasks.    Baseline  100% return to daily tasks, doesn't feel limited-08/21/17    Status  Achieved      PT LONG TERM GOAL #5   Title  Patient is able to verbalize understanding of where and how to be fitted for a compression sleeve for flying and also risk reduction practices for lymphedema.    Baseline  Pt has recieved these-08/21/17    Status  Achieved      Additional Long Term Goals   Additional Long Term Goals  Yes      PT LONG TERM GOAL #6   Title  Patient will be independent with performing self manual lymph drainage to reduce swelling.    Time  4    Period  Weeks    Status  New      PT LONG TERM GOAL #7   Title  Patient will be independent with a safe self progression of her  home exercise program.    Time  4    Period  Weeks    Status  New      Breast Clinic Goals - 05/15/17 0803      Patient will be able to verbalize understanding of pertinent lymphedema risk reduction practices relevant to her diagnosis specifically related to  skin care.   Time  1    Period  Days    Status  Achieved      Patient will be able to return demonstrate and/or verbalize understanding of the post-op home exercise program related to regaining shoulder range of motion.   Time  1    Period  Days    Status  Achieved      Patient will be able to verbalize understanding of the importance of attending the postoperative After Breast Cancer Class for further lymphedema risk reduction education and therapeutic exercise.   Time  1    Period  Days    Status  Achieved           Plan - 08/25/17 1149    Clinical Impression Statement  Shoulder ROM much improved since she was last seen by today's treating therapist. The area just inferior to her left chest incision site continues to appear swollen but the area feels like tissue rather than fluid at this point. Neural tension still present in bilateral UE.    Rehab Potential  Excellent    Clinical Impairments Affecting Rehab Potential  Recent surgery with extensive axillary dissection    PT Frequency  2x / week    PT Duration  4 weeks    PT Treatment/Interventions  ADLs/Self Care Home Management;Therapeutic activities;Therapeutic exercise;Patient/family education;Manual techniques;Manual lymph drainage;Scar mobilization;Passive range of motion    PT Next Visit Plan  Remeasure BUE to compare to baselines. Continue manual techniques to improve ROM     PT Home Exercise Plan  Continue with previously given HEP    Consulted and Agree with Plan of Care  Patient       Patient will benefit from skilled therapeutic intervention in order to improve the following deficits and impairments:  Pain, Decreased scar mobility, Decreased range of motion,  Impaired UE functional use, Postural dysfunction, Decreased knowledge of precautions, Increased fascial restricitons, Decreased activity tolerance, Decreased knowledge of use of DME, Increased edema  Visit Diagnosis: Abnormal posture  Aftercare following surgery for neoplasm  Stiffness of left shoulder, not elsewhere classified  Stiffness of right shoulder, not elsewhere classified  Pain in left upper arm  Pain in right upper arm  Malignant neoplasm of axillary tail of right breast in female, estrogen receptor positive ([place])     Problem List Patient Active Problem List   Diagnosis Date Noted  . Breast cancer metastasized to axillary lymph node, right ([place]) 06/05/2017  . Genetic testing 05/27/2017  . Malignant neoplasm of axillary tail of right breast in female, estrogen receptor positive ([place]) 05/09/2017  . Cubital tunnel syndrome on left 10/02/2015  . Trigger finger, left index finger 04/12/2015  . Erroneous encounter - disregard 09/22/2014  . Diabetes type 1, controlled ([place]) 01/12/2013    Annia Friendly, PT 08/25/17 11:53 AM  [place], [place], [address] Phone: [phone]   Fax:  [phone]  Name: TEMPERENCE ZENOR MRN: [id number] Date of Birth: May 09, 1975

## 2017-08-28 ENCOUNTER — Ambulatory Visit: Payer: BLUE CROSS/BLUE SHIELD

## 2017-09-01 ENCOUNTER — Ambulatory Visit: Payer: BLUE CROSS/BLUE SHIELD

## 2017-09-01 DIAGNOSIS — M25612 Stiffness of left shoulder, not elsewhere classified: Secondary | ICD-10-CM

## 2017-09-01 DIAGNOSIS — R600 Localized edema: Secondary | ICD-10-CM | POA: Diagnosis not present

## 2017-09-01 DIAGNOSIS — M79622 Pain in left upper arm: Secondary | ICD-10-CM | POA: Diagnosis not present

## 2017-09-01 DIAGNOSIS — C50611 Malignant neoplasm of axillary tail of right female breast: Secondary | ICD-10-CM

## 2017-09-01 DIAGNOSIS — M79621 Pain in right upper arm: Secondary | ICD-10-CM

## 2017-09-01 DIAGNOSIS — Z483 Aftercare following surgery for neoplasm: Secondary | ICD-10-CM

## 2017-09-01 DIAGNOSIS — M25611 Stiffness of right shoulder, not elsewhere classified: Secondary | ICD-10-CM | POA: Diagnosis not present

## 2017-09-01 DIAGNOSIS — Z170 Estrogen receptor positive status [ER+]: Secondary | ICD-10-CM

## 2017-09-01 DIAGNOSIS — R293 Abnormal posture: Secondary | ICD-10-CM

## 2017-09-01 NOTE — Assessment & Plan Note (Signed)
06/05/2017:Bilateral mastectomies: Right mastectomy: IDC grade 1, 3 foci 1.2 cm, 1.2 cm, 1 cm, IgA DCIS, LVH diffuse present, margins negative, 4/7 lymph nodes positive with extracapsular extension, ER 95%, PR 95%, HER-2 negative, Ki-67 2% to 10%, T1 cN2 aM0 stage II a pathological stage; left mastectomy: Benign  Mammaprint done preoperatively: Low risk Axillary lymph node dissection 06/26/2017: 2/22 lymph nodes positive(making a total of 6 lymph nodes that were positive)  Treatment plan: 1.Because ofher high risk features (6 LN Pos),werecommended adjuvant systemic chemotherapywithdose dense Adriamycin and Cytoxan x4 followed by Taxol weekly x12 started 07/22/2017 3.Followed by radiation.  4.Followed by adjuvant antiestrogen therapy  UPBEAT clinical trial ([id number]):No toxicities related to the trial --------------------------------------------------------------------------- Current treatment: Cycle4day1dose dense Adriamycin and Cytoxan Labs reviewed Echocardiogram 06/20/2017: EF 55 to 60%  Chemo toxicities: 1.Neutropenia: We will decrease the dosage of cycle 2 chemotherapy 2.discontinue dexamethasone because of diabetes  3.  Dec Taste: Shes been eating Fried chicken a lot.  Decreased taste: Patient has a taste for patient does not want towear any wig  Diabetes mellitus: Patient has an insulin pump.  Monitoring blood sugars closely  Patient plans to go to the beach first week of July.  Return to clinic in2weeksforcycle1 Taxol.

## 2017-09-01 NOTE — Progress Notes (Signed)
Patient Care Team: Tisovec, Fransico Him, MD as PCP - General (Internal Medicine)  DIAGNOSIS:  Encounter Diagnoses  Name Primary?  . Breast cancer metastasized to axillary lymph node, right ([place])   . Vitamin D deficiency Yes    SUMMARY OF ONCOLOGIC HISTORY:   Malignant neoplasm of axillary tail of right breast in female, estrogen receptor positive ([place])   05/09/2017 Initial Diagnosis    May 2018: Palpable right axillary mass.  The recommended a 6-week follow-up but apparently the mass decreased in size.  Recent increase in the mass was noted, biopsy revealed Grade 1 IDC with DCIS, lymphovascular invasion present, 4 small hypoechoic masses largest 0.9 cm, T1BN1 stage Ib clinical stage      06/05/2017 Surgery    Bilateral mastectomies: Right mastectomy: IDC grade 1, 3 foci 1.2 cm, 1.2 cm, 1 cm, IgA DCIS, LVH diffuse present, margins negative, 4/7 lymph nodes positive with extracapsular extension, ER 95%, PR 95%, HER-2 negative, Ki-67 2% to 10%, T1 cN2 aM0 stage II a pathological stage; left mastectomy: Benign      06/26/2017 Surgery    Axillary lymph node dissection: 2/22 lymph nodes positive (making a total of 6+ lymph nodes)      07/22/2017 -  Chemotherapy    Adjuvant chemotherapy with dose dense Adriamycin and Cytoxan x4 followed by Taxol weekly x12        Breast cancer metastasized to axillary lymph node, right ([place])   06/05/2017 Initial Diagnosis    Breast cancer metastasized to axillary lymph node, right ([place])      07/22/2017 -  Chemotherapy    Adjuvant chemotherapy with dose dense Adriamycin and Cytoxan x4 followed by Taxol weekly x12        CHIEF COMPLIANT: Cycle 4 dose dense Adriamycin and Cytoxan  INTERVAL HISTORY: Stephanie Holden is a 42 year old with above-mentioned history of bilateral mastectomies for right-sided breast cancer who is currently on adjuvant chemotherapy and today's cycle 4 of dose dense Adriamycin and Cytoxan.  She tolerated cycle 3 fairly well.  Did  not have any nausea vomiting.  She did have fatigue.  We are monitoring her blood sugars.  REVIEW OF SYSTEMS:   Constitutional: Fatigue and decreased taste Eyes: Denies blurriness of vision Ears, nose, mouth, throat, and face: Denies mucositis or sore throat Respiratory: Denies cough, dyspnea or wheezes Cardiovascular: Denies palpitation, chest discomfort Gastrointestinal:  Denies nausea, heartburn or change in bowel habits Skin: Denies abnormal skin rashes Lymphatics: Denies new lymphadenopathy or easy bruising Neurological:Denies numbness, tingling or new weaknesses Behavioral/Psych: Mood is stable, no new changes  Extremities: No lower extremity edema Breast:  denies any pain or lumps or nodules in either breasts All other systems were reviewed with the patient and are negative.  I have reviewed the past medical history, past surgical history, social history and family history with the patient and they are unchanged from previous note.  ALLERGIES:  is allergic to dilaudid [hydromorphone hcl].  MEDICATIONS:  Current Outpatient Medications  Medication Sig Dispense Refill  . acetaminophen (TYLENOL) 500 MG tablet Take 1,000 mg by mouth every 6 (six) hours as needed for moderate pain or headache.    . dexamethasone (DECADRON) 2 MG tablet Take 1 tablet day after chemotherapy and 1 tablet 2 days after with food. (Patient not taking: Reported on 07/09/2017) 10 tablet 0  . gabapentin (NEURONTIN) 300 MG capsule Take 1 capsule (300 mg total) by mouth at bedtime. 30 capsule 0  . insulin aspart (NOVOLOG) 100 UNIT/ML injection Inject  45 Units into the skin See admin instructions. Use via insulin pump up to 45 units daily    . lidocaine-prilocaine (EMLA) cream Apply to affected area once (Patient not taking: Reported on 07/09/2017) 30 g 3  . LORazepam (ATIVAN) 0.5 MG tablet Take 1 tablet (0.5 mg total) by mouth every 6 (six) hours as needed for anxiety or sleep. 30 tablet 3  . magic mouthwash  w/lidocaine SOLN Take 5 mLs by mouth 4 (four) times daily as needed for mouth pain. Swish, swallow, or spit 240 mL 1  . methocarbamol (ROBAXIN) 500 MG tablet Take 1 tablet (500 mg total) by mouth every 8 (eight) hours as needed for muscle spasms. 30 tablet 1  . ondansetron (ZOFRAN) 8 MG tablet Take 1 tablet (8 mg total) by mouth 2 (two) times daily as needed. Start on the third day after chemotherapy. (Patient not taking: Reported on 07/09/2017) 30 tablet 1  . Probiotic Product (PROBIOTIC DAILY PO) Take 1 capsule by mouth at bedtime.     . prochlorperazine (COMPAZINE) 10 MG tablet Take 1 tablet (10 mg total) by mouth every 6 (six) hours as needed (Nausea or vomiting). (Patient not taking: Reported on 07/09/2017) 30 tablet 1  . tamoxifen (NOLVADEX) 20 MG tablet TAKE 1 TABLET BY MOUTH EVERY DAY 30 tablet 0  . valACYclovir (VALTREX) 500 MG tablet Take 1 tablet (500 mg total) by mouth 2 (two) times daily. 60 tablet 3  . venlafaxine XR (EFFEXOR-XR) 37.5 MG 24 hr capsule Take 1 capsule (37.5 mg total) by mouth daily with breakfast. (Patient taking differently: Take 37.5 mg by mouth at bedtime. ) 30 capsule 3   No current facility-administered medications for this visit.     PHYSICAL EXAMINATION: ECOG PERFORMANCE STATUS: 1 - Symptomatic but completely ambulatory  Vitals:   09/02/17 0932  BP: (!) 145/74  Pulse: 86  Resp: 20  Temp: 97.9 F (36.6 C)  SpO2: 98%   Filed Weights   09/02/17 0932  Weight: 169 lb 11.2 oz (77 kg)    GENERAL:alert, no distress and comfortable SKIN: skin color, texture, turgor are normal, no rashes or significant lesions EYES: normal, Conjunctiva are pink and non-injected, sclera clear OROPHARYNX:no exudate, no erythema and lips, buccal mucosa, and tongue normal  NECK: supple, thyroid normal size, non-tender, without nodularity LYMPH:  no palpable lymphadenopathy in the cervical, axillary or inguinal LUNGS: clear to auscultation and percussion with normal breathing  effort HEART: regular rate & rhythm and no murmurs and no lower extremity edema ABDOMEN:abdomen soft, non-tender and normal bowel sounds MUSCULOSKELETAL:no cyanosis of digits and no clubbing  NEURO: alert & oriented x 3 with fluent speech, no focal motor/sensory deficits EXTREMITIES: No lower extremity edema   LABORATORY DATA:  I have reviewed the data as listed CMP Latest Ref Rng & Units 08/19/2017 08/05/2017 07/29/2017  Glucose 65 - 99 mg/dL 183(H) 166(H) 115  BUN 6 - 20 mg/dL 21(H) 14 12  Creatinine 0.44 - 1.00 mg/dL 0.71 0.81 0.75  Sodium 135 - 145 mmol/L 140 139 138  Potassium 3.5 - 5.1 mmol/L 4.2 3.9 4.1  Chloride 101 - 111 mmol/L 103 104 104  CO2 22 - 32 mmol/L '27 27 29  ' Calcium 8.9 - 10.3 mg/dL 8.7(L) 8.7 8.4  Total Protein 6.5 - 8.1 g/dL 7.0 6.9 6.3(L)  Total Bilirubin 0.3 - 1.2 mg/dL 0.1(L) <0.2(L) 0.3  Alkaline Phos 38 - 126 U/L 118 111 89  AST 15 - 41 U/L '15 13 11  ' ALT 14 -  54 U/L '15 16 11    ' Lab Results  Component Value Date   WBC 8.6 09/02/2017   HGB 10.7 (L) 09/02/2017   HCT 32.7 (L) 09/02/2017   MCV 82.9 09/02/2017   PLT 236 09/02/2017   NEUTROABS 6.3 09/02/2017    ASSESSMENT & PLAN:  Breast cancer metastasized to axillary lymph node, right ([facility]) 06/05/2017:Bilateral mastectomies: Right mastectomy: IDC grade 1, 3 foci 1.2 cm, 1.2 cm, 1 cm, IgA DCIS, LVH diffuse present, margins negative, 4/7 lymph nodes positive with extracapsular extension, ER 95%, PR 95%, HER-2 negative, Ki-67 2% to 10%, T1 cN2 aM0 stage II a pathological stage; left mastectomy: Benign  Mammaprint done preoperatively: Low risk Axillary lymph node dissection 06/26/2017: 2/22 lymph nodes positive(making a total of 6 lymph nodes that were positive)  Treatment plan: 1.Because ofher high risk features (6 LN Pos),werecommended adjuvant systemic chemotherapywithdose dense Adriamycin and Cytoxan x4 followed by Taxol weekly x12 started 07/22/2017 3.Followed by radiation.  4.Followed by  adjuvant antiestrogen therapy  UPBEAT clinical trial ([id number]):No toxicities related to the trial --------------------------------------------------------------------------- Current treatment: Cycle4day1dose dense Adriamycin and Cytoxan Labs reviewed Echocardiogram 06/20/2017: EF 55 to 60%  Chemo toxicities: 1.Neutropenia: We will decrease the dosage of cycle 2 chemotherapy 2.discontinue dexamethasone because of diabetes  3.  Dec Taste: Shes been eating Fried chicken a lot.  Decreased taste: Patient has a taste for patient does not want towear any wig  Diabetes mellitus: Patient has an insulin pump.  Monitoring blood sugars closely  Patient plans to go to the beach first week of July.  Return to clinic in2weeksforcycle1 Taxol.       Orders Placed This Encounter  Procedures  . VITAMIN D 25 Hydroxy (Vit-D Deficiency, Fractures)    Standing Status:   Future    Standing Expiration Date:   10/07/2018   The patient has a good understanding of the overall plan. she agrees with it. she will call with any problems that may develop before the next visit here.   Harriette Ohara, MD 09/02/17

## 2017-09-01 NOTE — Therapy (Signed)
[place], [place], [address] Phone: [phone]   Fax:  [phone]  Physical Therapy Treatment  Patient Details  Name: Stephanie Holden MRN: [id number] Date of Birth: [birth year] Referring Provider: Dr. [name]   Encounter Date: 09/01/2017  PT End of Session - 09/01/17 1019    Visit Number  12    Number of Visits  20    Date for PT Re-Evaluation  09/18/17    PT Start Time  0935    PT Stop Time  1019    PT Time Calculation (min)  44 min    Activity Tolerance  Patient tolerated treatment well    Behavior During Therapy  [facility] for tasks assessed/performed       Past Medical History:  Diagnosis Date  . Cancer [facility])    Breast cancer - right  . Constipation   . Depression   . Genetic testing 05/27/2017   Breast/GYN panel (23 genes) @ [facility] - No pathogenic mutations detected  . GERD (gastroesophageal reflux disease)   . Insulin dependent type 1 diabetes mellitus ([place])   . Insulin pump in place   . PONV (postoperative nausea and vomiting)   . Stenosing tenosynovitis of thumb 06/2014   right  . Thyroid goiter     Past Surgical History:  Procedure Laterality Date  . AXILLARY LYMPH NODE DISSECTION Right 06/26/2017   Procedure: AXILLARY LYMPH NODE DISSECTION;  Surgeon: [name], MD;  Location: [place];  Service: General;  Laterality: Right;  . CARPAL TUNNEL RELEASE Bilateral 10/28/2013   Procedure: BILATERAL CARPAL TUNNEL RELEASE;  Surgeon: Daryll Brod, MD;  Location: [place];  Service: Orthopedics;  Laterality: Bilateral;  . CESAREAN SECTION  2000; 08/20/2001  . ESSURE TUBAL LIGATION  2005   failed, tubal puncture  . LAPAROSCOPIC TUBAL LIGATION  04/02/2007   removal Essure  . LAPAROSCOPIC UNILATERAL SALPINGECTOMY  2005  . MASTECTOMY W/ SENTINEL NODE BIOPSY Bilateral 06/05/2017   Procedure: BILATERAL MASTECTOMIES  WITH RIGHT SENTINEL LYMPH NODE BIOPSY;  Surgeon: [name], MD;   Location: [place];  Service: General;  Laterality: Bilateral;  . PORTACATH PLACEMENT Left 06/26/2017   Procedure: INSERTION PORT-A-CATH;  Surgeon: [name], MD;  Location: [place];  Service: General;  Laterality: Left;  . TRIGGER FINGER RELEASE Left 04/12/2014   Procedure: RELEASE A-1 PULLEY LEFT THUMB;  Surgeon: Daryll Brod, MD;  Location: [place];  Service: Orthopedics;  Laterality: Left;  . TRIGGER FINGER RELEASE Right 07/19/2014   Procedure: RELEASE TRIGGER FINGER/A-1 PULLEY RIGHT THUMB;  Surgeon: Daryll Brod, MD;  Location: [place];  Service: Orthopedics;  Laterality: Right;    There were no vitals filed for this visit.  Subjective Assessment - 09/01/17 0953    Subjective  I had some swelling in my Rt had and arm Friday morning. But I wore my sleeve and it looked better by the end of the day.     Pertinent History  Patient was diagnosed on 05/08/17 with right invasive ductal carcinoma breast cancer. She was seen in 5/18 and found to have a right axillary mass but did not f/u for further testing. She then had a mammogram in 2/19 and was diagnosed with right breast cancer which had metastasized to the axillary lymph nodes. She had multiple small masses in the lower outer quadrant of her right breast with the largest mass measuring 1.3 cm. It is ER/PR positive and HER2 negative with a Ki67  of 2%. Patient underwent a bilateral mastectomy with right targeted node dissection with 4/7 positive on 06/05/17 for right invasive ductal carcinoma. She then underwent a right axillary node dissection on 06/26/17 with 2/20 positive nodes. She also had a port-a-cath placed on 06/26/17. She currently has 3 drains in place and will see her surgeon on 07/17/17. The plan is chemotherapy, radiation, and anti-estrogen therapy.     Patient Stated Goals  Try to get arm moving and reduce lymphedema risk    Currently in Pain?  No/denies            LYMPHEDEMA/ONCOLOGY QUESTIONNAIRE -  09/01/17 0956      Right Upper Extremity Lymphedema   10 cm Proximal to Olecranon Process  33.6 cm    Olecranon Process  24.9 cm    10 cm Proximal to Ulnar Styloid Process  22.5 cm    Just Proximal to Ulnar Styloid Process  14.2 cm    Across Hand at [employer]  17.1 cm    At [address] of 2nd Digit  6 cm      Left Upper Extremity Lymphedema   10 cm Proximal to Olecranon Process  33.6 cm    Olecranon Process  24.8 cm    10 cm Proximal to Ulnar Styloid Process  21.1 cm    Just Proximal to Ulnar Styloid Process  14.8 cm    Across Hand at [employer]  17.3 cm    At [place] of 2nd Digit  5.9 cm                [facility] Adult PT Treatment/Exercise - 09/01/17 0001      Self-Care   Self-Care  Other Self-Care Comments    Other Self-Care Comments   Answered pts questions reagrding lymphedema and when to wear compression garments.      Shoulder Exercises: Pulleys   Flexion  2 minutes    ABduction  2 minutes    ABduction Limitations  VCs to remind pt to slow down with ROM      Shoulder Exercises: Therapy Ball   Flexion  Both;10 reps With forward lean into end of stretch    ABduction  Right;Left;10 reps Same side lean into end of stretch      Manual Therapy   Manual Lymphatic Drainage (MLD)  In Supine: Short neck, 5 diaphragmatic breaths, Rt inguinal nodes and Rt axillo-inguinal anastomosis, then Rt UE from lateral upper arm to dorsal hand working proximal to distal then retracing all steps                  PT Long Term Goals - 08/21/17 1124      PT LONG TERM GOAL #1   Title  Patient will demonstrate she has returned to baseline related to shoulder ROM and function.    Status  Achieved      PT LONG TERM GOAL #2   Title  Patient will increase bilateral shoulder flexion AROM to >/= 140 degrees for increased ease reaching overhead.    Baseline  Rt 153 and Lt 159 degrees-08/21/17    Status  Achieved      PT LONG TERM GOAL #3   Title  Patient will increase bilateral  shoulder abduction AROM to >/= 140 degrees for increased ease reaching overhead.    Baseline  Rt 157 and Lt 153 degrees-08/21/17      PT LONG TERM GOAL #4   Title  Patient will report she has been able  to return to >/= 75% of normal daily tasks.    Baseline  100% return to daily tasks, doesn't feel limited-08/21/17    Status  Achieved      PT LONG TERM GOAL #5   Title  Patient is able to verbalize understanding of where and how to be fitted for a compression sleeve for flying and also risk reduction practices for lymphedema.    Baseline  Pt has recieved these-08/21/17    Status  Achieved      Additional Long Term Goals   Additional Long Term Goals  Yes      PT LONG TERM GOAL #6   Title  Patient will be independent with performing self manual lymph drainage to reduce swelling.    Time  4    Period  Weeks    Status  New      PT LONG TERM GOAL #7   Title  Patient will be independent with a safe self progression of her home exercise program.    Time  4    Period  Weeks    Status  New      Breast Clinic Goals - 05/15/17 0803      Patient will be able to verbalize understanding of pertinent lymphedema risk reduction practices relevant to her diagnosis specifically related to skin care.   Time  1    Period  Days    Status  Achieved      Patient will be able to return demonstrate and/or verbalize understanding of the post-op home exercise program related to regaining shoulder range of motion.   Time  1    Period  Days    Status  Achieved      Patient will be able to verbalize understanding of the importance of attending the postoperative After Breast Cancer Class for further lymphedema risk reduction education and therapeutic exercise.   Time  1    Period  Days    Status  Achieved           Plan - 09/01/17 1019    Clinical Impression Statement  Pt reports her Rt hand/forearm was swollen on Friday morning. She wore her compression sleeve and gauntlet all day and swelling was  improved by end of day. Remeasured her circumference today and her measurements were reduced from last time measured. Spent time answering pts questions and educating her about stages of lymphedema and how she is between Stages 0 and 1 and what that means. Pt to begin wearing her garments with increased activities (working in yard or exercising)  Pt reports feeling better about lymphedema after todays session.     Rehab Potential  Excellent    Clinical Impairments Affecting Rehab Potential  Recent surgery with extensive axillary dissection    PT Frequency  2x / week    PT Duration  4 weeks    PT Treatment/Interventions  ADLs/Self Care Home Management;Therapeutic activities;Therapeutic exercise;Patient/family education;Manual techniques;Manual lymph drainage;Scar mobilization;Passive range of motion    PT Next Visit Plan  Continue manual techniques to improve ROM ; instruct pt in self MLD    Consulted and Agree with Plan of Care  Patient       Patient will benefit from skilled therapeutic intervention in order to improve the following deficits and impairments:  Pain, Decreased scar mobility, Decreased range of motion, Impaired UE functional use, Postural dysfunction, Decreased knowledge of precautions, Increased fascial restricitons, Decreased activity tolerance, Decreased knowledge of use of DME, Increased edema  Visit  Diagnosis: Abnormal posture  Aftercare following surgery for neoplasm  Stiffness of left shoulder, not elsewhere classified  Stiffness of right shoulder, not elsewhere classified  Pain in left upper arm  Pain in right upper arm  Malignant neoplasm of axillary tail of right breast in female, estrogen receptor positive ([place])     Problem List Patient Active Problem List   Diagnosis Date Noted  . Breast cancer metastasized to axillary lymph node, right ([place]) 06/05/2017  . Genetic testing 05/27/2017  . Malignant neoplasm of axillary tail of right breast in female,  estrogen receptor positive ([place]) 05/09/2017  . Cubital tunnel syndrome on left 10/02/2015  . Trigger finger, left index finger 04/12/2015  . Erroneous encounter - disregard 09/22/2014  . Diabetes type 1, controlled ([place]) 01/12/2013    Otelia Limes, PTA 09/01/2017, 11:01 AM  [place], [place], [address] Phone: [phone]   Fax:  [phone]  Name: Stephanie Holden MRN: [id number] Date of Birth: 08/03/1975

## 2017-09-02 ENCOUNTER — Inpatient Hospital Stay: Payer: BLUE CROSS/BLUE SHIELD

## 2017-09-02 ENCOUNTER — Encounter: Payer: Self-pay | Admitting: *Deleted

## 2017-09-02 ENCOUNTER — Inpatient Hospital Stay: Payer: BLUE CROSS/BLUE SHIELD | Attending: Hematology and Oncology

## 2017-09-02 ENCOUNTER — Inpatient Hospital Stay (HOSPITAL_BASED_OUTPATIENT_CLINIC_OR_DEPARTMENT_OTHER): Payer: BLUE CROSS/BLUE SHIELD | Admitting: Hematology and Oncology

## 2017-09-02 VITALS — BP 145/74 | HR 86 | Temp 97.9°F | Resp 20 | Ht 62.75 in | Wt 169.7 lb

## 2017-09-02 DIAGNOSIS — Z170 Estrogen receptor positive status [ER+]: Secondary | ICD-10-CM

## 2017-09-02 DIAGNOSIS — C50611 Malignant neoplasm of axillary tail of right female breast: Secondary | ICD-10-CM | POA: Insufficient documentation

## 2017-09-02 DIAGNOSIS — E119 Type 2 diabetes mellitus without complications: Secondary | ICD-10-CM

## 2017-09-02 DIAGNOSIS — C50911 Malignant neoplasm of unspecified site of right female breast: Secondary | ICD-10-CM

## 2017-09-02 DIAGNOSIS — Z5189 Encounter for other specified aftercare: Secondary | ICD-10-CM | POA: Diagnosis not present

## 2017-09-02 DIAGNOSIS — Z9641 Presence of insulin pump (external) (internal): Secondary | ICD-10-CM | POA: Insufficient documentation

## 2017-09-02 DIAGNOSIS — Z9013 Acquired absence of bilateral breasts and nipples: Secondary | ICD-10-CM | POA: Diagnosis not present

## 2017-09-02 DIAGNOSIS — Z794 Long term (current) use of insulin: Secondary | ICD-10-CM | POA: Diagnosis not present

## 2017-09-02 DIAGNOSIS — Z5111 Encounter for antineoplastic chemotherapy: Secondary | ICD-10-CM | POA: Diagnosis not present

## 2017-09-02 DIAGNOSIS — C773 Secondary and unspecified malignant neoplasm of axilla and upper limb lymph nodes: Principal | ICD-10-CM

## 2017-09-02 DIAGNOSIS — D701 Agranulocytosis secondary to cancer chemotherapy: Secondary | ICD-10-CM | POA: Insufficient documentation

## 2017-09-02 DIAGNOSIS — E559 Vitamin D deficiency, unspecified: Secondary | ICD-10-CM

## 2017-09-02 DIAGNOSIS — R439 Unspecified disturbances of smell and taste: Secondary | ICD-10-CM | POA: Diagnosis not present

## 2017-09-02 LAB — CMP (CANCER CENTER ONLY)
ALBUMIN: 3.6 g/dL (ref 3.5–5.0)
ALK PHOS: 126 U/L (ref 40–150)
ALT: 33 U/L (ref 0–55)
ANION GAP: 9 (ref 3–11)
AST: 31 U/L (ref 5–34)
BUN: 10 mg/dL (ref 7–26)
CALCIUM: 8.8 mg/dL (ref 8.4–10.4)
CO2: 27 mmol/L (ref 22–29)
CREATININE: 0.88 mg/dL (ref 0.60–1.10)
Chloride: 104 mmol/L (ref 98–109)
GFR, Est AFR Am: >60 mL/min (ref >60)
GFR, Estimated: >60 mL/min (ref >60)
GLUCOSE: 264 mg/dL — AB (ref 70–140)
Potassium: 4.7 mmol/L (ref 3.5–5.1)
Sodium: 140 mmol/L (ref 136–145)
TOTAL PROTEIN: 6.9 g/dL (ref 6.4–8.3)

## 2017-09-02 LAB — CBC WITH DIFFERENTIAL (CANCER CENTER ONLY)
BASOS PCT: 0 %
Basophils Absolute: 0 10*3/uL (ref 0.0–0.1)
EOS ABS: 0 10*3/uL (ref 0.0–0.5)
EOS PCT: 0 %
HCT: 32.7 % — ABNORMAL LOW (ref 34.8–46.6)
Hemoglobin: 10.7 g/dL — ABNORMAL LOW (ref 11.6–15.9)
Lymphocytes Relative: 17 %
Lymphs Abs: 1.4 10*3/uL (ref 0.9–3.3)
MCH: 27.1 pg (ref 25.1–34.0)
MCHC: 32.7 g/dL (ref 31.5–36.0)
MCV: 82.9 fL (ref 79.5–101.0)
MONO ABS: 0.8 10*3/uL (ref 0.1–0.9)
MONOS PCT: 10 %
NEUTROS ABS: 6.3 10*3/uL (ref 1.5–6.5)
Neutrophils Relative %: 73 %
PLATELETS: 236 10*3/uL (ref 145–400)
RBC: 3.94 MIL/uL (ref 3.70–5.45)
RDW: 20.7 % — AB (ref 11.2–14.5)
WBC Count: 8.6 10*3/uL (ref 3.9–10.3)

## 2017-09-02 MED ORDER — HEPARIN SOD (PORK) LOCK FLUSH 100 UNIT/ML IV SOLN
500.0000 [IU] | Freq: Once | INTRAVENOUS | Status: AC | PRN
  Administered 2017-09-02: 500 [IU]
  Filled 2017-09-02: qty 5

## 2017-09-02 MED ORDER — PALONOSETRON HCL INJECTION 0.25 MG/5ML
INTRAVENOUS | Status: AC
  Filled 2017-09-02: qty 5

## 2017-09-02 MED ORDER — SODIUM CHLORIDE 0.9 % IV SOLN: 600.0000 mg/m2 | Freq: Once | INTRAVENOUS | Status: DC

## 2017-09-02 MED ORDER — SODIUM CHLORIDE 0.9% FLUSH
10.0000 mL | INTRAVENOUS | Status: DC | PRN
  Administered 2017-09-02: 10 mL
  Filled 2017-09-02: qty 10

## 2017-09-02 MED ORDER — SODIUM CHLORIDE 0.9 % IV SOLN
600.0000 mg/m2 | Freq: Once | INTRAVENOUS | Status: AC
  Administered 2017-09-02: 1100 mg via INTRAVENOUS
  Filled 2017-09-02: qty 55

## 2017-09-02 MED ORDER — DOXORUBICIN HCL CHEMO IV INJECTION 2 MG/ML
60.0000 mg/m2 | Freq: Once | INTRAVENOUS | Status: AC
  Administered 2017-09-02: 110 mg via INTRAVENOUS
  Filled 2017-09-02: qty 55

## 2017-09-02 MED ORDER — SODIUM CHLORIDE 0.9 % IV SOLN
Freq: Once | INTRAVENOUS | Status: AC
  Administered 2017-09-02: 10:00:00 via INTRAVENOUS

## 2017-09-02 MED ORDER — PALONOSETRON HCL INJECTION 0.25 MG/5ML
0.2500 mg | Freq: Once | INTRAVENOUS | Status: AC
  Administered 2017-09-02: 0.25 mg via INTRAVENOUS

## 2017-09-02 MED ORDER — SODIUM CHLORIDE 0.9 % IV SOLN
Freq: Once | INTRAVENOUS | Status: AC
  Administered 2017-09-02: 11:00:00 via INTRAVENOUS
  Filled 2017-09-02: qty 5

## 2017-09-02 NOTE — Patient Instructions (Signed)
[facility] Discharge Instructions for Patients Receiving Chemotherapy  Today you received the following chemotherapy agents Adriamycin and Cytoxan  To help prevent nausea and vomiting after your treatment, we encourage you to take your nausea medication as directed.  If you develop nausea and vomiting that is not controlled by your nausea medication, call the clinic.   BELOW ARE SYMPTOMS THAT SHOULD BE REPORTED IMMEDIATELY:  *FEVER GREATER THAN 100.5 F  *CHILLS WITH OR WITHOUT FEVER  NAUSEA AND VOMITING THAT IS NOT CONTROLLED WITH YOUR NAUSEA MEDICATION  *UNUSUAL SHORTNESS OF BREATH  *UNUSUAL BRUISING OR BLEEDING  TENDERNESS IN MOUTH AND THROAT WITH OR WITHOUT PRESENCE OF ULCERS  *URINARY PROBLEMS  *BOWEL PROBLEMS  UNUSUAL RASH Items with * indicate a potential emergency and should be followed up as soon as possible.  Feel free to call the clinic should you have any questions or concerns. The clinic phone number is [phone].  Please show the CHEMO ALERT CARD at check-in to the Emergency Department and triage nurse.   

## 2017-09-04 ENCOUNTER — Inpatient Hospital Stay: Payer: BLUE CROSS/BLUE SHIELD

## 2017-09-04 VITALS — BP 146/82 | HR 95 | Temp 98.6°F | Resp 18

## 2017-09-04 DIAGNOSIS — Z170 Estrogen receptor positive status [ER+]: Secondary | ICD-10-CM

## 2017-09-04 DIAGNOSIS — D701 Agranulocytosis secondary to cancer chemotherapy: Secondary | ICD-10-CM | POA: Diagnosis not present

## 2017-09-04 DIAGNOSIS — Z9641 Presence of insulin pump (external) (internal): Secondary | ICD-10-CM | POA: Diagnosis not present

## 2017-09-04 DIAGNOSIS — E119 Type 2 diabetes mellitus without complications: Secondary | ICD-10-CM | POA: Diagnosis not present

## 2017-09-04 DIAGNOSIS — C773 Secondary and unspecified malignant neoplasm of axilla and upper limb lymph nodes: Secondary | ICD-10-CM | POA: Diagnosis not present

## 2017-09-04 DIAGNOSIS — Z5111 Encounter for antineoplastic chemotherapy: Secondary | ICD-10-CM | POA: Diagnosis not present

## 2017-09-04 DIAGNOSIS — Z794 Long term (current) use of insulin: Secondary | ICD-10-CM | POA: Diagnosis not present

## 2017-09-04 DIAGNOSIS — C50611 Malignant neoplasm of axillary tail of right female breast: Secondary | ICD-10-CM

## 2017-09-04 DIAGNOSIS — Z5189 Encounter for other specified aftercare: Secondary | ICD-10-CM | POA: Diagnosis not present

## 2017-09-04 DIAGNOSIS — R439 Unspecified disturbances of smell and taste: Secondary | ICD-10-CM | POA: Diagnosis not present

## 2017-09-04 DIAGNOSIS — Z9013 Acquired absence of bilateral breasts and nipples: Secondary | ICD-10-CM | POA: Diagnosis not present

## 2017-09-04 DIAGNOSIS — C50911 Malignant neoplasm of unspecified site of right female breast: Secondary | ICD-10-CM

## 2017-09-04 MED ORDER — PEGFILGRASTIM-CBQV 6 MG/0.6ML ~~LOC~~ SOSY
6.0000 mg | PREFILLED_SYRINGE | Freq: Once | SUBCUTANEOUS | Status: AC
  Administered 2017-09-04: 6 mg via SUBCUTANEOUS

## 2017-09-04 NOTE — Patient Instructions (Signed)
Pegfilgrastim injection What is this medicine? PEGFILGRASTIM (PEG fil gra stim) is a long-acting granulocyte colony-stimulating factor that stimulates the growth of neutrophils, a type of white blood cell important in the body's fight against infection. It is used to reduce the incidence of fever and infection in patients with certain types of cancer who are receiving chemotherapy that affects the bone marrow, and to increase survival after being exposed to high doses of radiation. This medicine may be used for other purposes; ask your health care provider or pharmacist if you have questions. COMMON BRAND NAME(S): Neulasta What should I tell my health care provider before I take this medicine? They need to know if you have any of these conditions: -kidney disease -latex allergy -ongoing radiation therapy -sickle cell disease -skin reactions to acrylic adhesives (On-Body Injector only) -an unusual or allergic reaction to pegfilgrastim, filgrastim, other medicines, foods, dyes, or preservatives -pregnant or trying to get pregnant -breast-feeding How should I use this medicine? This medicine is for injection under the skin. If you get this medicine at home, you will be taught how to prepare and give the pre-filled syringe or how to use the On-body Injector. Refer to the patient Instructions for Use for detailed instructions. Use exactly as directed. Tell your healthcare provider immediately if you suspect that the On-body Injector may not have performed as intended or if you suspect the use of the On-body Injector resulted in a missed or partial dose. It is important that you put your used needles and syringes in a special sharps container. Do not put them in a trash can. If you do not have a sharps container, call your pharmacist or healthcare provider to get one. Talk to your pediatrician regarding the use of this medicine in children. While this drug may be prescribed for selected conditions,  precautions do apply. Overdosage: If you think you have taken too much of this medicine contact a poison control center or emergency room at once. NOTE: This medicine is only for you. Do not share this medicine with others. What if I miss a dose? It is important not to miss your dose. Call your doctor or health care professional if you miss your dose. If you miss a dose due to an On-body Injector failure or leakage, a new dose should be administered as soon as possible using a single prefilled syringe for manual use. What may interact with this medicine? Interactions have not been studied. Give your health care provider a list of all the medicines, herbs, non-prescription drugs, or dietary supplements you use. Also tell them if you smoke, drink alcohol, or use illegal drugs. Some items may interact with your medicine. This list may not describe all possible interactions. Give your health care provider a list of all the medicines, herbs, non-prescription drugs, or dietary supplements you use. Also tell them if you smoke, drink alcohol, or use illegal drugs. Some items may interact with your medicine. What should I watch for while using this medicine? You may need blood work done while you are taking this medicine. If you are going to need a MRI, CT scan, or other procedure, tell your doctor that you are using this medicine (On-Body Injector only). What side effects may I notice from receiving this medicine? Side effects that you should report to your doctor or health care professional as soon as possible: -allergic reactions like skin rash, itching or hives, swelling of the face, lips, or tongue -dizziness -fever -pain, redness, or irritation at site   where injected -pinpoint red spots on the skin -red or dark-brown urine -shortness of breath or breathing problems -stomach or side pain, or pain at the shoulder -swelling -tiredness -trouble passing urine or change in the amount of urine Side  effects that usually do not require medical attention (report to your doctor or health care professional if they continue or are bothersome): -bone pain -muscle pain This list may not describe all possible side effects. Call your doctor for medical advice about side effects. You may report side effects to FDA at 1-800-FDA-1088. Where should I keep my medicine? Keep out of the reach of children. Store pre-filled syringes in a refrigerator between 2 and 8 degrees C (36 and 46 degrees F). Do not freeze. Keep in carton to protect from light. Throw away this medicine if it is left out of the refrigerator for more than 48 hours. Throw away any unused medicine after the expiration date. NOTE: This sheet is a summary. It may not cover all possible information. If you have questions about this medicine, talk to your doctor, pharmacist, or health care provider.  2018 Elsevier/Gold Standard (2016-03-07 12:58:03)  

## 2017-09-05 DIAGNOSIS — C50919 Malignant neoplasm of unspecified site of unspecified female breast: Secondary | ICD-10-CM | POA: Diagnosis not present

## 2017-09-08 DIAGNOSIS — Z794 Long term (current) use of insulin: Secondary | ICD-10-CM | POA: Diagnosis not present

## 2017-09-08 DIAGNOSIS — E109 Type 1 diabetes mellitus without complications: Secondary | ICD-10-CM | POA: Diagnosis not present

## 2017-09-11 ENCOUNTER — Ambulatory Visit: Payer: BLUE CROSS/BLUE SHIELD

## 2017-09-16 ENCOUNTER — Inpatient Hospital Stay: Payer: BLUE CROSS/BLUE SHIELD

## 2017-09-16 ENCOUNTER — Inpatient Hospital Stay (HOSPITAL_BASED_OUTPATIENT_CLINIC_OR_DEPARTMENT_OTHER): Payer: BLUE CROSS/BLUE SHIELD | Admitting: Hematology and Oncology

## 2017-09-16 ENCOUNTER — Inpatient Hospital Stay: Payer: BLUE CROSS/BLUE SHIELD | Admitting: Medical

## 2017-09-16 ENCOUNTER — Other Ambulatory Visit: Payer: Self-pay | Admitting: Hematology and Oncology

## 2017-09-16 ENCOUNTER — Telehealth: Payer: Self-pay | Admitting: Hematology and Oncology

## 2017-09-16 VITALS — BP 151/91 | HR 80 | Temp 98.7°F | Resp 16

## 2017-09-16 DIAGNOSIS — C50611 Malignant neoplasm of axillary tail of right female breast: Secondary | ICD-10-CM | POA: Diagnosis not present

## 2017-09-16 DIAGNOSIS — Z5189 Encounter for other specified aftercare: Secondary | ICD-10-CM | POA: Diagnosis not present

## 2017-09-16 DIAGNOSIS — Z170 Estrogen receptor positive status [ER+]: Secondary | ICD-10-CM

## 2017-09-16 DIAGNOSIS — Z5111 Encounter for antineoplastic chemotherapy: Secondary | ICD-10-CM | POA: Diagnosis not present

## 2017-09-16 DIAGNOSIS — C50911 Malignant neoplasm of unspecified site of right female breast: Secondary | ICD-10-CM

## 2017-09-16 DIAGNOSIS — C773 Secondary and unspecified malignant neoplasm of axilla and upper limb lymph nodes: Secondary | ICD-10-CM | POA: Diagnosis not present

## 2017-09-16 DIAGNOSIS — E119 Type 2 diabetes mellitus without complications: Secondary | ICD-10-CM

## 2017-09-16 DIAGNOSIS — R5383 Other fatigue: Secondary | ICD-10-CM | POA: Diagnosis not present

## 2017-09-16 DIAGNOSIS — T8090XA Unspecified complication following infusion and therapeutic injection, initial encounter: Secondary | ICD-10-CM

## 2017-09-16 DIAGNOSIS — Z9013 Acquired absence of bilateral breasts and nipples: Secondary | ICD-10-CM | POA: Diagnosis not present

## 2017-09-16 DIAGNOSIS — Z794 Long term (current) use of insulin: Secondary | ICD-10-CM | POA: Diagnosis not present

## 2017-09-16 DIAGNOSIS — Z9641 Presence of insulin pump (external) (internal): Secondary | ICD-10-CM | POA: Diagnosis not present

## 2017-09-16 DIAGNOSIS — E559 Vitamin D deficiency, unspecified: Secondary | ICD-10-CM

## 2017-09-16 DIAGNOSIS — D701 Agranulocytosis secondary to cancer chemotherapy: Secondary | ICD-10-CM | POA: Diagnosis not present

## 2017-09-16 DIAGNOSIS — R439 Unspecified disturbances of smell and taste: Secondary | ICD-10-CM | POA: Diagnosis not present

## 2017-09-16 LAB — CBC WITH DIFFERENTIAL (CANCER CENTER ONLY)
Basophils Absolute: 0.1 10*3/uL (ref 0.0–0.1)
Basophils Relative: 1 %
EOS PCT: 0 %
Eosinophils Absolute: 0 10*3/uL (ref 0.0–0.5)
HEMATOCRIT: 30.9 % — AB (ref 34.8–46.6)
Hemoglobin: 10.1 g/dL — ABNORMAL LOW (ref 11.6–15.9)
LYMPHS ABS: 1.3 10*3/uL (ref 0.9–3.3)
LYMPHS PCT: 11 %
MCH: 27.1 pg (ref 25.1–34.0)
MCHC: 32.8 g/dL (ref 31.5–36.0)
MCV: 82.7 fL (ref 79.5–101.0)
Monocytes Absolute: 0.9 10*3/uL (ref 0.1–0.9)
Monocytes Relative: 8 %
Neutro Abs: 9.2 10*3/uL — ABNORMAL HIGH (ref 1.5–6.5)
Neutrophils Relative %: 80 %
PLATELETS: 183 10*3/uL (ref 145–400)
RBC: 3.74 MIL/uL (ref 3.70–5.45)
RDW: 24.9 % — ABNORMAL HIGH (ref 11.2–14.5)
WBC: 11.5 10*3/uL — AB (ref 3.9–10.3)

## 2017-09-16 LAB — CMP (CANCER CENTER ONLY)
ALBUMIN: 3.8 g/dL (ref 3.5–5.0)
ALT: 17 U/L (ref 0–44)
AST: 19 U/L (ref 15–41)
Alkaline Phosphatase: 137 U/L — ABNORMAL HIGH (ref 38–126)
Anion gap: 8 (ref 5–15)
BUN: 12 mg/dL (ref 6–20)
CHLORIDE: 102 mmol/L (ref 98–111)
CO2: 28 mmol/L (ref 22–32)
Calcium: 9.4 mg/dL (ref 8.9–10.3)
Creatinine: 0.86 mg/dL (ref 0.44–1.00)
GFR, Est AFR Am: >60 mL/min (ref >60)
GLUCOSE: 269 mg/dL — AB (ref 70–99)
POTASSIUM: 4.2 mmol/L (ref 3.5–5.1)
Sodium: 138 mmol/L (ref 135–145)
Total Bilirubin: 0.2 mg/dL — ABNORMAL LOW (ref 0.3–1.2)
Total Protein: 6.9 g/dL (ref 6.5–8.1)

## 2017-09-16 MED ORDER — DIPHENHYDRAMINE HCL 50 MG/ML IJ SOLN
50.0000 mg | Freq: Once | INTRAMUSCULAR | Status: AC
  Administered 2017-09-16: 50 mg via INTRAVENOUS

## 2017-09-16 MED ORDER — SODIUM CHLORIDE 0.9% FLUSH
10.0000 mL | INTRAVENOUS | Status: DC | PRN
  Administered 2017-09-16: 10 mL
  Filled 2017-09-16: qty 10

## 2017-09-16 MED ORDER — PACLITAXEL CHEMO INJECTION 300 MG/50ML
80.0000 mg/m2 | Freq: Once | INTRAVENOUS | Status: AC
  Administered 2017-09-16: 144 mg via INTRAVENOUS
  Filled 2017-09-16: qty 24

## 2017-09-16 MED ORDER — SODIUM CHLORIDE 0.9 % IV SOLN
Freq: Once | INTRAVENOUS | Status: AC
  Administered 2017-09-16: 11:00:00 via INTRAVENOUS

## 2017-09-16 MED ORDER — FAMOTIDINE IN NACL 20-0.9 MG/50ML-% IV SOLN
20.0000 mg | Freq: Once | INTRAVENOUS | Status: AC | PRN
  Administered 2017-09-16: 20 mg via INTRAVENOUS

## 2017-09-16 MED ORDER — HEPARIN SOD (PORK) LOCK FLUSH 100 UNIT/ML IV SOLN
500.0000 [IU] | Freq: Once | INTRAVENOUS | Status: AC | PRN
  Administered 2017-09-16: 500 [IU]
  Filled 2017-09-16: qty 5

## 2017-09-16 MED ORDER — SODIUM CHLORIDE 0.9 % IV SOLN
20.0000 mg | Freq: Once | INTRAVENOUS | Status: AC
  Administered 2017-09-16: 20 mg via INTRAVENOUS
  Filled 2017-09-16: qty 2

## 2017-09-16 MED ORDER — DIPHENHYDRAMINE HCL 50 MG/ML IJ SOLN
INTRAMUSCULAR | Status: AC
  Filled 2017-09-16: qty 1

## 2017-09-16 MED ORDER — FAMOTIDINE IN NACL 20-0.9 MG/50ML-% IV SOLN
INTRAVENOUS | Status: AC
  Filled 2017-09-16: qty 50

## 2017-09-16 MED ORDER — FAMOTIDINE IN NACL 20-0.9 MG/50ML-% IV SOLN
20.0000 mg | Freq: Once | INTRAVENOUS | Status: AC
  Administered 2017-09-16: 20 mg via INTRAVENOUS

## 2017-09-16 NOTE — Progress Notes (Signed)
   DATE: 09/16/2017      X CHEMO/IMMUNOTHERAPY REACTION           MD: Dr. Nicholas Lose  AGENT/BLOOD PRODUCT RECEIVING TODAY:   Taxol  AGENT/BLOOD PRODUCT RECEIVING IMMEDIATELY PRIOR TO REACTION: Taxol  VS: BP:     176/90 p:       90 SPO2:       98% on room air         REACTION(S): Burning of the ears  PREMEDS: Benadryl 50 mg p.o., dexamethasone 20 mg IV, Pepcid 20 mg IV  INTERVENTION: The patient was given a second dose of Pepcid 20 mg IV  Review of Systems  Constitutional: Negative for diaphoresis.  HENT: Negative for trouble swallowing.        Burning of the ears  Respiratory: Negative for cough, choking, chest tightness, shortness of breath and wheezing.   Cardiovascular: Negative for chest pain and palpitations.  Gastrointestinal: Negative for nausea and vomiting.  Musculoskeletal: Negative for back pain.  Skin: Negative for color change and rash.  Neurological: Negative for dizziness, light-headedness and headaches.    Physical Exam  Constitutional: No distress.  HENT:  Head: Normocephalic and atraumatic.  Cardiovascular: Normal rate, regular rhythm and normal heart sounds. Exam reveals no gallop and no friction rub.  No murmur heard. Pulmonary/Chest: Effort normal and breath sounds normal. No respiratory distress. She has no wheezes. She has no rales.  Neurological: She is alert.  Skin: Skin is warm and dry. No rash noted. She is not diaphoretic. No erythema.     OUTCOME: Patient responded to intervention. Chemo/Immunotherapy restarted and completed.    Sandi Mealy, MHS, PA-C

## 2017-09-16 NOTE — Telephone Encounter (Signed)
No 6/25 los °

## 2017-09-16 NOTE — Progress Notes (Signed)
AT 1226 patient reported "my ears are burning" 5 minutes into her Taxol infusion. Lucianne Lei PA paged and Taxol paused with NS infusing. Pepcid 20 IV ordered (this was a repeat med she already had- due to her diabetes) and that was given at 1233. Lucianne Lei said to allow that to infuse and then flush and rechallenge. Taxol was restarted at 1306 at the first titration rate again. Symptoms all resolved about 1240 per patient.

## 2017-09-16 NOTE — Progress Notes (Signed)
Patient Care Team: Tisovec, Fransico Him, MD as PCP - General (Internal Medicine)  DIAGNOSIS:  Encounter Diagnosis  Name Primary?  . Malignant neoplasm of axillary tail of right breast in female, estrogen receptor positive ([place])     SUMMARY OF ONCOLOGIC HISTORY:   Malignant neoplasm of axillary tail of right breast in female, estrogen receptor positive ([place])   05/09/2017 Initial Diagnosis    May 2018: Palpable right axillary mass.  The recommended a 6-week follow-up but apparently the mass decreased in size.  Recent increase in the mass was noted, biopsy revealed Grade 1 IDC with DCIS, lymphovascular invasion present, 4 small hypoechoic masses largest 0.9 cm, T1BN1 stage Ib clinical stage      06/05/2017 Surgery    Bilateral mastectomies: Right mastectomy: IDC grade 1, 3 foci 1.2 cm, 1.2 cm, 1 cm, IgA DCIS, LVH diffuse present, margins negative, 4/7 lymph nodes positive with extracapsular extension, ER 95%, PR 95%, HER-2 negative, Ki-67 2% to 10%, T1 cN2 aM0 stage II a pathological stage; left mastectomy: Benign      06/26/2017 Surgery    Axillary lymph node dissection: 2/22 lymph nodes positive (making a total of 6+ lymph nodes)      07/22/2017 -  Chemotherapy    Adjuvant chemotherapy with dose dense Adriamycin and Cytoxan x4 followed by Taxol weekly x12        Breast cancer metastasized to axillary lymph node, right ([place])   06/05/2017 Initial Diagnosis    Breast cancer metastasized to axillary lymph node, right ([place])      07/22/2017 -  Chemotherapy    Adjuvant chemotherapy with dose dense Adriamycin and Cytoxan x4 followed by Taxol weekly x12        CHIEF COMPLIANT: Cycle 1 Taxol  INTERVAL HISTORY: Stephanie Holden is a 42 year old with above-mentioned history of bilateral mastectomies for right breast cancer who is currently on adjuvant chemotherapy with starting of weekly Taxol.  Her major complaint with the first 4 cycles of chemo was a decrease in taste but she has managed to  maintain her weight and was able to find foods that she could eat. She appears to be emotionally drained.  She is in tears because she has not been able to do all the activities that she can normally do at home.  She has to take naps because of the fatigue.  Her biggest issue is related to anticipatory nausea related to the taste in the mouth when she gets a port flush.  REVIEW OF SYSTEMS:   Constitutional: Denies fevers, chills or abnormal weight loss Eyes: Denies blurriness of vision Ears, nose, mouth, throat, and face: Denies mucositis or sore throat Respiratory: Denies cough, dyspnea or wheezes Cardiovascular: Denies palpitation, chest discomfort Gastrointestinal:  Denies nausea, heartburn or change in bowel habits Skin: Denies abnormal skin rashes Lymphatics: Denies new lymphadenopathy or easy bruising Neurological:Denies numbness, tingling or new weaknesses Behavioral/Psych: Mood is stable, no new changes  Extremities: No lower extremity edema  All other systems were reviewed with the patient and are negative.  I have reviewed the past medical history, past surgical history, social history and family history with the patient and they are unchanged from previous note.  ALLERGIES:  is allergic to dilaudid [hydromorphone hcl].  MEDICATIONS:  Current Outpatient Medications  Medication Sig Dispense Refill  . acetaminophen (TYLENOL) 500 MG tablet Take 1,000 mg by mouth every 6 (six) hours as needed for moderate pain or headache.    . insulin aspart (NOVOLOG) 100 UNIT/ML injection  Inject 45 Units into the skin See admin instructions. Use via insulin pump up to 45 units daily    . lidocaine-prilocaine (EMLA) cream Apply to affected area once (Patient not taking: Reported on 07/09/2017) 30 g 3  . LORazepam (ATIVAN) 0.5 MG tablet Take 1 tablet (0.5 mg total) by mouth every 6 (six) hours as needed for anxiety or sleep. 30 tablet 3  . ondansetron (ZOFRAN) 8 MG tablet Take 1 tablet (8 mg total)  by mouth 2 (two) times daily as needed. Start on the third day after chemotherapy. (Patient not taking: Reported on 07/09/2017) 30 tablet 1  . Probiotic Product (PROBIOTIC DAILY PO) Take 1 capsule by mouth at bedtime.     . valACYclovir (VALTREX) 500 MG tablet Take 1 tablet (500 mg total) by mouth 2 (two) times daily. 60 tablet 3  . venlafaxine XR (EFFEXOR-XR) 37.5 MG 24 hr capsule Take 1 capsule (37.5 mg total) by mouth daily with breakfast. (Patient taking differently: Take 37.5 mg by mouth at bedtime. ) 30 capsule 3   No current facility-administered medications for this visit.    Facility-Administered Medications Ordered in Other Visits  Medication Dose Route Frequency Provider Last Rate Last Dose  . 0.9 %  sodium chloride infusion   Intravenous Once Nicholas Lose, MD      . dexamethasone (DECADRON) 20 mg in sodium chloride 0.9 % 50 mL IVPB  20 mg Intravenous Once Nicholas Lose, MD      . diphenhydrAMINE (BENADRYL) injection 50 mg  50 mg Intravenous Once Nicholas Lose, MD      . famotidine (PEPCID) IVPB 20 mg premix  20 mg Intravenous Once Nicholas Lose, MD      . heparin lock flush 100 unit/mL  500 Units Intracatheter Once PRN Nicholas Lose, MD      . PACLitaxel (TAXOL) 144 mg in dextrose 5 % 250 mL chemo infusion (</= 74m/m2)  80 mg/m2 (Treatment Plan Recorded) Intravenous Once GNicholas Lose MD      . sodium chloride flush (NS) 0.9 % injection 10 mL  10 mL Intracatheter PRN GNicholas Lose MD        PHYSICAL EXAMINATION: ECOG PERFORMANCE STATUS: 1 - Symptomatic but completely ambulatory  Vitals:   09/16/17 1003  BP: (!) 162/82  Pulse: 84  Resp: 17  Temp: 98.5 F (36.9 C)  SpO2: 99%   Filed Weights   09/16/17 1003  Weight: 168 lb 12.8 oz (76.6 kg)    GENERAL:alert, no distress and comfortable SKIN: skin color, texture, turgor are normal, no rashes or significant lesions EYES: normal, Conjunctiva are pink and non-injected, sclera clear OROPHARYNX:no exudate, no erythema and  lips, buccal mucosa, and tongue normal  NECK: supple, thyroid normal size, non-tender, without nodularity LYMPH:  no palpable lymphadenopathy in the cervical, axillary or inguinal LUNGS: clear to auscultation and percussion with normal breathing effort HEART: regular rate & rhythm and no murmurs and no lower extremity edema ABDOMEN:abdomen soft, non-tender and normal bowel sounds MUSCULOSKELETAL:no cyanosis of digits and no clubbing  NEURO: alert & oriented x 3 with fluent speech, no focal motor/sensory deficits EXTREMITIES: No lower extremity edema   LABORATORY DATA:  I have reviewed the data as listed CMP Latest Ref Rng & Units 09/16/2017 09/02/2017 08/19/2017  Glucose 70 - 99 mg/dL 269(H) 264(H) 183(H)  BUN 6 - 20 mg/dL 12 10 21(H)  Creatinine 0.44 - 1.00 mg/dL 0.86 0.88 0.71  Sodium 135 - 145 mmol/L 138 140 140  Potassium 3.5 - 5.1 mmol/L  4.2 4.7 4.2  Chloride 98 - 111 mmol/L 102 104 103  CO2 22 - 32 mmol/L '28 27 27  ' Calcium 8.9 - 10.3 mg/dL 9.4 8.8 8.7(L)  Total Protein 6.5 - 8.1 g/dL 6.9 6.9 7.0  Total Bilirubin 0.3 - 1.2 mg/dL 0.2(L) <0.2(L) 0.1(L)  Alkaline Phos 38 - 126 U/L 137(H) 126 118  AST 15 - 41 U/L '19 31 15  ' ALT 0 - 44 U/L 17 33 15    Lab Results  Component Value Date   WBC 11.5 (H) 09/16/2017   HGB 10.1 (L) 09/16/2017   HCT 30.9 (L) 09/16/2017   MCV 82.7 09/16/2017   PLT 183 09/16/2017   NEUTROABS 9.2 (H) 09/16/2017    ASSESSMENT & PLAN:  Malignant neoplasm of axillary tail of right breast in female, estrogen receptor positive ([place]) 06/05/2017:Bilateral mastectomies: Right mastectomy: IDC grade 1, 3 foci 1.2 cm, 1.2 cm, 1 cm, IgA DCIS, LVH diffuse present, margins negative, 4/7 lymph nodes positive with extracapsular extension, ER 95%, PR 95%, HER-2 negative, Ki-67 2% to 10%, T1 cN2 aM0 stage II a pathological stage; left mastectomy: Benign  Mammaprint done preoperatively: Low risk Axillary lymph node dissection 06/26/2017: 2/22 lymph nodes positive(making a  total of 6 lymph nodes that were positive)  Treatment plan: 1.Because ofher high risk features (6 LN Pos),werecommended adjuvant systemic chemotherapywithdose dense Adriamycin and Cytoxan x4 followed by Taxol weekly x12 started 07/22/2017 3.Followed by radiation.  4.Followed by adjuvant antiestrogen therapy  UPBEAT clinical trial ([id number]):No toxicities related to the trial --------------------------------------------------------------------------- Current treatment: Completed 4 cycles ofdose dense Adriamycin and Cytoxan, today cycle 1 Taxol Labs reviewed Echocardiogram 06/20/2017: EF 55 to 60%  Decreased taste: Patient has a taste for patient does not want towear any wig  Diabetes mellitus: Patient has an insulin pump.Monitoring blood sugars closely .  She is doing much better with her sugar control. Patient plans to go to the beach first week of July.  Return to clinic weekly for Taxol treatment.  I will see her every other week.    No orders of the defined types were placed in this encounter.  The patient has a good understanding of the overall plan. she agrees with it. she will call with any problems that may develop before the next visit here.   Harriette Ohara, MD 09/16/17

## 2017-09-16 NOTE — Assessment & Plan Note (Signed)
06/05/2017:Bilateral mastectomies: Right mastectomy: IDC grade 1, 3 foci 1.2 cm, 1.2 cm, 1 cm, IgA DCIS, LVH diffuse present, margins negative, 4/7 lymph nodes positive with extracapsular extension, ER 95%, PR 95%, HER-2 negative, Ki-67 2% to 10%, T1 cN2 aM0 stage II a pathological stage; left mastectomy: Benign  Mammaprint done preoperatively: Low risk Axillary lymph node dissection 06/26/2017: 2/22 lymph nodes positive(making a total of 6 lymph nodes that were positive)  Treatment plan: 1.Because ofher high risk features (6 LN Pos),werecommended adjuvant systemic chemotherapywithdose dense Adriamycin and Cytoxan x4 followed by Taxol weekly x12 started 07/22/2017 3.Followed by radiation.  4.Followed by adjuvant antiestrogen therapy  UPBEAT clinical trial ([id number]):No toxicities related to the trial --------------------------------------------------------------------------- Current treatment: Completed 4 cycles ofdose dense Adriamycin and Cytoxan, today cycle 1 Taxol Labs reviewed Echocardiogram 06/20/2017: EF 55 to 60%  Decreased taste: Patient has a taste for patient does not want towear any wig  Diabetes mellitus: Patient has an insulin pump.Monitoring blood sugars closely .  She is doing much better with her sugar control. Patient plans to go to the beach first week of July.  Return to clinic weekly for Taxol treatment.  I will see her every other week.

## 2017-09-16 NOTE — Patient Instructions (Signed)
[facility] Discharge Instructions for Patients Receiving Chemotherapy  Today you received the following chemotherapy agents Taxol.  To help prevent nausea and vomiting after your treatment, we encourage you to take your nausea medication as directed. Paclitaxel injection What is this medicine? PACLITAXEL (PAK li TAX el) is a chemotherapy drug. It targets fast dividing cells, like cancer cells, and causes these cells to die. This medicine is used to treat ovarian cancer, breast cancer, and other cancers. This medicine may be used for other purposes; ask your health care provider or pharmacist if you have questions. COMMON BRAND NAME(S): Onxol, Taxol What should I tell my health care provider before I take this medicine? They need to know if you have any of these conditions: -blood disorders -irregular heartbeat -infection (especially a virus infection such as chickenpox, cold sores, or herpes) -liver disease -previous or ongoing radiation therapy -an unusual or allergic reaction to paclitaxel, alcohol, polyoxyethylated castor oil, other chemotherapy agents, other medicines, foods, dyes, or preservatives -pregnant or trying to get pregnant -breast-feeding How should I use this medicine? This drug is given as an infusion into a vein. It is administered in a hospital or clinic by a specially trained health care professional. Talk to your pediatrician regarding the use of this medicine in children. Special care may be needed. Overdosage: If you think you have taken too much of this medicine contact a poison control center or emergency room at once. NOTE: This medicine is only for you. Do not share this medicine with others. What if I miss a dose? It is important not to miss your dose. Call your doctor or health care professional if you are unable to keep an appointment. What may interact with this medicine? Do not take this medicine with any of the following  medications: -disulfiram -metronidazole This medicine may also interact with the following medications: -cyclosporine -diazepam -ketoconazole -medicines to increase blood counts like filgrastim, pegfilgrastim, sargramostim -other chemotherapy drugs like cisplatin, doxorubicin, epirubicin, etoposide, teniposide, vincristine -quinidine -testosterone -vaccines -verapamil Talk to your doctor or health care professional before taking any of these medicines: -acetaminophen -aspirin -ibuprofen -ketoprofen -naproxen This list may not describe all possible interactions. Give your health care provider a list of all the medicines, herbs, non-prescription drugs, or dietary supplements you use. Also tell them if you smoke, drink alcohol, or use illegal drugs. Some items may interact with your medicine. What should I watch for while using this medicine? Your condition will be monitored carefully while you are receiving this medicine. You will need important blood work done while you are taking this medicine. This medicine can cause serious allergic reactions. To reduce your risk you will need to take other medicine(s) before treatment with this medicine. If you experience allergic reactions like skin rash, itching or hives, swelling of the face, lips, or tongue, tell your doctor or health care professional right away. In some cases, you may be given additional medicines to help with side effects. Follow all directions for their use. This drug may make you feel generally unwell. This is not uncommon, as chemotherapy can affect healthy cells as well as cancer cells. Report any side effects. Continue your course of treatment even though you feel ill unless your doctor tells you to stop. Call your doctor or health care professional for advice if you get a fever, chills or sore throat, or other symptoms of a cold or flu. Do not treat yourself. This drug decreases your body's ability to fight infections. Try  to  avoid being around people who are sick. This medicine may increase your risk to bruise or bleed. Call your doctor or health care professional if you notice any unusual bleeding. Be careful brushing and flossing your teeth or using a toothpick because you may get an infection or bleed more easily. If you have any dental work done, tell your dentist you are receiving this medicine. Avoid taking products that contain aspirin, acetaminophen, ibuprofen, naproxen, or ketoprofen unless instructed by your doctor. These medicines may hide a fever. Do not become pregnant while taking this medicine. Women should inform their doctor if they wish to become pregnant or think they might be pregnant. There is a potential for serious side effects to an unborn child. Talk to your health care professional or pharmacist for more information. Do not breast-feed an infant while taking this medicine. Men are advised not to father a child while receiving this medicine. This product may contain alcohol. Ask your pharmacist or healthcare provider if this medicine contains alcohol. Be sure to tell all healthcare providers you are taking this medicine. Certain medicines, like metronidazole and disulfiram, can cause an unpleasant reaction when taken with alcohol. The reaction includes flushing, headache, nausea, vomiting, sweating, and increased thirst. The reaction can last from 30 minutes to several hours. What side effects may I notice from receiving this medicine? Side effects that you should report to your doctor or health care professional as soon as possible: -allergic reactions like skin rash, itching or hives, swelling of the face, lips, or tongue -low blood counts - This drug may decrease the number of white blood cells, red blood cells and platelets. You may be at increased risk for infections and bleeding. -signs of infection - fever or chills, cough, sore throat, pain or difficulty passing urine -signs of decreased  platelets or bleeding - bruising, pinpoint red spots on the skin, black, tarry stools, nosebleeds -signs of decreased red blood cells - unusually weak or tired, fainting spells, lightheadedness -breathing problems -chest pain -high or low blood pressure -mouth sores -nausea and vomiting -pain, swelling, redness or irritation at the injection site -pain, tingling, numbness in the hands or feet -slow or irregular heartbeat -swelling of the ankle, feet, hands Side effects that usually do not require medical attention (report to your doctor or health care professional if they continue or are bothersome): -bone pain -complete hair loss including hair on your head, underarms, pubic hair, eyebrows, and eyelashes -changes in the color of fingernails -diarrhea -loosening of the fingernails -loss of appetite -muscle or joint pain -red flush to skin -sweating This list may not describe all possible side effects. Call your doctor for medical advice about side effects. You may report side effects to FDA at 1-800-FDA-1088. Where should I keep my medicine? This drug is given in a hospital or clinic and will not be stored at home. NOTE: This sheet is a summary. It may not cover all possible information. If you have questions about this medicine, talk to your doctor, pharmacist, or health care provider.  2018 Elsevier/Gold Standard (2015-01-10 19:58:00)   If you develop nausea and vomiting that is not controlled by your nausea medication, call the clinic.   BELOW ARE SYMPTOMS THAT SHOULD BE REPORTED IMMEDIATELY:  *FEVER GREATER THAN 100.5 F  *CHILLS WITH OR WITHOUT FEVER  NAUSEA AND VOMITING THAT IS NOT CONTROLLED WITH YOUR NAUSEA MEDICATION  *UNUSUAL SHORTNESS OF BREATH  *UNUSUAL BRUISING OR BLEEDING  TENDERNESS IN MOUTH AND THROAT WITH OR  WITHOUT PRESENCE OF ULCERS  *URINARY PROBLEMS  *BOWEL PROBLEMS  UNUSUAL RASH Items with * indicate a potential emergency and should be followed up  as soon as possible.  Feel free to call the clinic you have any questions or concerns. The clinic phone number is [phone].  Please show the [facility] at check-in to the Emergency Department and triage nurse.

## 2017-09-17 LAB — VITAMIN D 25 HYDROXY (VIT D DEFICIENCY, FRACTURES): VIT D 25 HYDROXY: 27.4 ng/mL — AB (ref 30.0–100.0)

## 2017-09-18 ENCOUNTER — Other Ambulatory Visit: Payer: Self-pay

## 2017-09-18 ENCOUNTER — Ambulatory Visit: Payer: BLUE CROSS/BLUE SHIELD | Admitting: Physical Therapy

## 2017-09-18 ENCOUNTER — Telehealth: Payer: Self-pay | Admitting: Hematology and Oncology

## 2017-09-18 ENCOUNTER — Encounter: Payer: Self-pay | Admitting: Physical Therapy

## 2017-09-18 DIAGNOSIS — R600 Localized edema: Secondary | ICD-10-CM

## 2017-09-18 DIAGNOSIS — Z170 Estrogen receptor positive status [ER+]: Secondary | ICD-10-CM | POA: Diagnosis not present

## 2017-09-18 DIAGNOSIS — M25612 Stiffness of left shoulder, not elsewhere classified: Secondary | ICD-10-CM

## 2017-09-18 DIAGNOSIS — M25611 Stiffness of right shoulder, not elsewhere classified: Secondary | ICD-10-CM | POA: Diagnosis not present

## 2017-09-18 DIAGNOSIS — R293 Abnormal posture: Secondary | ICD-10-CM

## 2017-09-18 DIAGNOSIS — M79621 Pain in right upper arm: Secondary | ICD-10-CM

## 2017-09-18 DIAGNOSIS — C50611 Malignant neoplasm of axillary tail of right female breast: Secondary | ICD-10-CM

## 2017-09-18 DIAGNOSIS — Z483 Aftercare following surgery for neoplasm: Secondary | ICD-10-CM

## 2017-09-18 DIAGNOSIS — M79622 Pain in left upper arm: Secondary | ICD-10-CM | POA: Diagnosis not present

## 2017-09-18 NOTE — Telephone Encounter (Signed)
Called patient regarding new template

## 2017-09-18 NOTE — Therapy (Addendum)
[place], [place], [address] Phone: [phone]   Fax:  [phone]  Physical Therapy Treatment  Patient Details  Name: Stephanie Holden MRN: [id number] Date of Birth: 16-Sep-1975 Referring Provider: Dr. [name]   Encounter Date: 09/18/2017  PT End of Session - 09/18/17 1006    Visit Number  13    Number of Visits  16    Date for PT Re-Evaluation  10/30/17    PT Start Time  0925    PT Stop Time  1015    PT Time Calculation (min)  50 min    Activity Tolerance  Patient tolerated treatment well    Behavior During Therapy  [facility] for tasks assessed/performed       Past Medical History:  Diagnosis Date  . Cancer [facility])    Breast cancer - right  . Constipation   . Depression   . Genetic testing 05/27/2017   Breast/GYN panel (23 genes) @ [facility] - No pathogenic mutations detected  . GERD (gastroesophageal reflux disease)   . Insulin dependent type 1 diabetes mellitus ([place])   . Insulin pump in place   . PONV (postoperative nausea and vomiting)   . Stenosing tenosynovitis of thumb 06/2014   right  . Thyroid goiter     Past Surgical History:  Procedure Laterality Date  . AXILLARY LYMPH NODE DISSECTION Right 06/26/2017   Procedure: AXILLARY LYMPH NODE DISSECTION;  Surgeon: [name], MD;  Location: [facility];  Service: General;  Laterality: Right;  . CARPAL TUNNEL RELEASE Bilateral 10/28/2013   Procedure: BILATERAL CARPAL TUNNEL RELEASE;  Surgeon: Daryll Brod, MD;  Location: [facility];  Service: Orthopedics;  Laterality: Bilateral;  . CESAREAN SECTION  2000; 08/20/2001  . ESSURE TUBAL LIGATION  2005   failed, tubal puncture  . LAPAROSCOPIC TUBAL LIGATION  04/02/2007   removal Essure  . LAPAROSCOPIC UNILATERAL SALPINGECTOMY  2005  . MASTECTOMY W/ SENTINEL NODE BIOPSY Bilateral 06/05/2017   Procedure: BILATERAL MASTECTOMIES  WITH RIGHT SENTINEL LYMPH NODE BIOPSY;  Surgeon: [name], MD;   Location: [facility];  Service: General;  Laterality: Bilateral;  . PORTACATH PLACEMENT Left 06/26/2017   Procedure: INSERTION PORT-A-CATH;  Surgeon: [name], MD;  Location: [facility];  Service: General;  Laterality: Left;  . TRIGGER FINGER RELEASE Left 04/12/2014   Procedure: RELEASE A-1 PULLEY LEFT THUMB;  Surgeon: Daryll Brod, MD;  Location: [facility];  Service: Orthopedics;  Laterality: Left;  . TRIGGER FINGER RELEASE Right 07/19/2014   Procedure: RELEASE TRIGGER FINGER/A-1 PULLEY RIGHT THUMB;  Surgeon: Daryll Brod, MD;  Location: [facility];  Service: Orthopedics;  Laterality: Right;    There were no vitals filed for this visit.  Subjective Assessment - 09/18/17 0930    Subjective  I think I've decided the swelling on my chest is probably fatty tissue. I had a reaction to chemo (Taxol) Tuesday (09/16/17) but I'm ok.    Pertinent History  Patient was diagnosed on 05/08/17 with right invasive ductal carcinoma breast cancer. She was seen in 5/18 and found to have a right axillary mass but did not f/u for further testing. She then had a mammogram in 2/19 and was diagnosed with right breast cancer which had metastasized to the axillary lymph nodes. She had multiple small masses in the lower outer quadrant of her right breast with the largest mass measuring 1.3 cm. It is ER/PR positive and HER2 negative with a Ki67 of 2%. Patient  underwent a bilateral mastectomy with right targeted node dissection with 4/7 positive on 06/05/17 for right invasive ductal carcinoma. She then underwent a right axillary node dissection on 06/26/17 with 2/20 positive nodes. She also had a port-a-cath placed on 06/26/17. She currently has 3 drains in place and will see her surgeon on 07/17/17. The plan is chemotherapy, radiation, and anti-estrogen therapy.     Patient Stated Goals  Try to get arm moving and reduce lymphedema risk    Currently in Pain?  No/denies         [facility] PT Assessment - 09/18/17 0001       AROM   Right Shoulder Extension  52 Degrees    Right Shoulder Flexion  148 Degrees    Right Shoulder ABduction  168 Degrees    Left Shoulder Extension  41 Degrees    Left Shoulder Flexion  150 Degrees    Left Shoulder ABduction  150 Degrees        LYMPHEDEMA/ONCOLOGY QUESTIONNAIRE - 09/18/17 0933      Right Upper Extremity Lymphedema   10 cm Proximal to Olecranon Process  34.3 cm    Olecranon Process  24.5 cm    10 cm Proximal to Ulnar Styloid Process  22.5 cm    Just Proximal to Ulnar Styloid Process  14.2 cm    Across Hand at [employer]  17.1 cm    At [place] of 2nd Digit  5.8 cm                [facility] Adult PT Treatment/Exercise - 09/18/17 0001      Shoulder Exercises: Pulleys   Flexion  2 minutes    ABduction  2 minutes    ABduction Limitations  Pulleys done while PT reassessing for renewal             PT Education - 09/18/17 0954    Education provided  Yes    Education Details  Self manual lymph drainage for right upper extremity    Person(s) Educated  Patient    Methods  Explanation;Demonstration;Handout;Tactile cues;Verbal cues    Comprehension  Verbalized understanding;Returned demonstration          PT Long Term Goals - 09/18/17 0955      PT LONG TERM GOAL #1   Title  Patient will demonstrate she has returned to baseline related to shoulder ROM and function.    Time  6    Period  Weeks    Status  Achieved      PT LONG TERM GOAL #2   Title  Patient will increase bilateral shoulder flexion AROM to >/= 140 degrees for increased ease reaching overhead.    Time  6    Period  Weeks    Status  Achieved      PT LONG TERM GOAL #3   Title  Patient will increase bilateral shoulder abduction AROM to >/= 140 degrees for increased ease reaching overhead.    Time  6    Period  Weeks    Status  Achieved      PT LONG TERM GOAL #4   Title  Patient will report she has been able to return to >/= 75% of normal daily tasks.    Time  6     Period  Weeks    Status  Achieved      PT LONG TERM GOAL #5   Title  Patient is able to verbalize understanding of where and how to be  fitted for a compression sleeve for flying and also risk reduction practices for lymphedema.    Time  6    Period  Weeks    Status  Achieved      PT LONG TERM GOAL #6   Title  Patient will be independent with performing self manual lymph drainage to reduce swelling.    Time  4    Period  Weeks    Status  Partially Met      PT LONG TERM GOAL #7   Title  Patient will be independent with a safe self progression of her home exercise program.    Time  4    Period  Weeks    Status  --      Breast Clinic Goals - 05/15/17 0803      Patient will be able to verbalize understanding of pertinent lymphedema risk reduction practices relevant to her diagnosis specifically related to skin care.   Time  1    Period  Days    Status  Achieved      Patient will be able to return demonstrate and/or verbalize understanding of the post-op home exercise program related to regaining shoulder range of motion.   Time  1    Period  Days    Status  Achieved      Patient will be able to verbalize understanding of the importance of attending the postoperative After Breast Cancer Class for further lymphedema risk reduction education and therapeutic exercise.   Time  1    Period  Days    Status  Achieved           Plan - 09/18/17 1007    Clinical Impression Statement  Patient is doing very well with shoulder ROM. She continues to have puffiness in her left chest just inferior to her incision site but it is unclear if this is edema or fatty tissue. She plans to discuss this with her surgeon and consider a referral to a Psychiatric nurse. She will benefit from close surveillance with PT over the next 6 weeks to monitor signs of right arm lymphedema and ensure competence with manual lymph drainage.    Rehab Potential  Excellent    Clinical Impairments Affecting Rehab  Potential  Recent surgery with extensive axillary dissection    PT Frequency  Biweekly    PT Duration  6 weeks    PT Treatment/Interventions  ADLs/Self Care Home Management;Therapeutic activities;Therapeutic exercise;Patient/family education;Manual techniques;Manual lymph drainage;Scar mobilization;Passive range of motion    PT Next Visit Plan  Assess competence with self manual lymph drainage; measure shoulder ROM    PT Home Exercise Plan  Continue with previously given HEP    Consulted and Agree with Plan of Care  Patient       Patient will benefit from skilled therapeutic intervention in order to improve the following deficits and impairments:  Pain, Decreased scar mobility, Decreased range of motion, Impaired UE functional use, Postural dysfunction, Decreased knowledge of precautions, Increased fascial restricitons, Decreased activity tolerance, Decreased knowledge of use of DME, Increased edema  Visit Diagnosis: Abnormal posture - Plan: PT plan of care cert/re-cert  Aftercare following surgery for neoplasm - Plan: PT plan of care cert/re-cert  Stiffness of left shoulder, not elsewhere classified - Plan: PT plan of care cert/re-cert  Stiffness of right shoulder, not elsewhere classified - Plan: PT plan of care cert/re-cert  Pain in left upper arm - Plan: PT plan of care cert/re-cert  Pain in right upper  arm - Plan: PT plan of care cert/re-cert  Malignant neoplasm of axillary tail of right breast in female, estrogen receptor positive ([place]) - Plan: PT plan of care cert/re-cert  Localized edema - Plan: PT plan of care cert/re-cert     Problem List Patient Active Problem List   Diagnosis Date Noted  . Breast cancer metastasized to axillary lymph node, right ([place]) 06/05/2017  . Genetic testing 05/27/2017  . Malignant neoplasm of axillary tail of right breast in female, estrogen receptor positive ([place]) 05/09/2017  . Cubital tunnel syndrome on left 10/02/2015  . Trigger finger,  left index finger 04/12/2015  . Erroneous encounter - disregard 09/22/2014  . Diabetes type 1, controlled ([place]) 01/12/2013    Annia Friendly, PT 09/18/17 10:14 AM  [place], [place], [address] Phone: [phone]   Fax:  [phone]  Name: Stephanie Holden MRN: [id number] Date of Birth: 04/01/1975   PHYSICAL THERAPY DISCHARGE SUMMARY  Visits from Start of Care: 13  Current functional level related to goals / functional outcomes: Goals met. Only goal not met at last visit was the goal related to being independent with self manual lymph drainage. Spoke with pt on 10/13/17 and she reported she is doing well with manual lymph drainage and went fishing with her husband and had no increase in her edema. She felt ready for discharge from PT.   Remaining deficits: Persistent edema which is currently well controlled.   Education / Equipment: Lymphedema risk reduction and management. HEP. Plan: Patient agrees to discharge.  Patient goals were met. Patient is being discharged due to meeting the stated rehab goals.  ?????         Annia Friendly, [place] 10/13/17 1:32 PM

## 2017-09-18 NOTE — Patient Instructions (Addendum)

## 2017-09-19 ENCOUNTER — Ambulatory Visit (HOSPITAL_BASED_OUTPATIENT_CLINIC_OR_DEPARTMENT_OTHER)
Admission: RE | Admit: 2017-09-19 | Discharge: 2017-09-19 | Disposition: A | Discharge status: Home / Self Care | Payer: BLUE CROSS/BLUE SHIELD | Source: Ambulatory Visit | Attending: Internal Medicine | Admitting: Internal Medicine

## 2017-09-19 ENCOUNTER — Ambulatory Visit (HOSPITAL_COMMUNITY)
Admission: RE | Admit: 2017-09-19 | Discharge: 2017-09-19 | Disposition: A | Discharge status: Home / Self Care | Payer: BLUE CROSS/BLUE SHIELD | Source: Ambulatory Visit | Attending: Internal Medicine | Admitting: Internal Medicine

## 2017-09-19 ENCOUNTER — Encounter (HOSPITAL_COMMUNITY): Payer: Self-pay | Admitting: Internal Medicine

## 2017-09-19 VITALS — BP 140/89 | HR 106 | Ht 62.0 in | Wt 166.0 lb

## 2017-09-19 DIAGNOSIS — Z87891 Personal history of nicotine dependence: Secondary | ICD-10-CM | POA: Insufficient documentation

## 2017-09-19 DIAGNOSIS — Z9641 Presence of insulin pump (external) (internal): Secondary | ICD-10-CM | POA: Diagnosis not present

## 2017-09-19 DIAGNOSIS — Z170 Estrogen receptor positive status [ER+]: Secondary | ICD-10-CM | POA: Insufficient documentation

## 2017-09-19 DIAGNOSIS — Z8249 Family history of ischemic heart disease and other diseases of the circulatory system: Secondary | ICD-10-CM | POA: Insufficient documentation

## 2017-09-19 DIAGNOSIS — Z9013 Acquired absence of bilateral breasts and nipples: Secondary | ICD-10-CM | POA: Diagnosis not present

## 2017-09-19 DIAGNOSIS — C50611 Malignant neoplasm of axillary tail of right female breast: Secondary | ICD-10-CM

## 2017-09-19 DIAGNOSIS — F329 Major depressive disorder, single episode, unspecified: Secondary | ICD-10-CM | POA: Diagnosis not present

## 2017-09-19 DIAGNOSIS — E049 Nontoxic goiter, unspecified: Secondary | ICD-10-CM | POA: Diagnosis not present

## 2017-09-19 DIAGNOSIS — Z885 Allergy status to narcotic agent status: Secondary | ICD-10-CM | POA: Insufficient documentation

## 2017-09-19 DIAGNOSIS — K219 Gastro-esophageal reflux disease without esophagitis: Secondary | ICD-10-CM | POA: Insufficient documentation

## 2017-09-19 DIAGNOSIS — Z794 Long term (current) use of insulin: Secondary | ICD-10-CM | POA: Insufficient documentation

## 2017-09-19 DIAGNOSIS — E109 Type 1 diabetes mellitus without complications: Secondary | ICD-10-CM | POA: Insufficient documentation

## 2017-09-19 DIAGNOSIS — Z79899 Other long term (current) drug therapy: Secondary | ICD-10-CM | POA: Diagnosis not present

## 2017-09-19 NOTE — Addendum Note (Signed)
Encounter addended by: Shirley Muscat, RN on: 09/19/2017 12:22 PM  Actions taken: Order list changed, Diagnosis association updated, Sign clinical note

## 2017-09-19 NOTE — Progress Notes (Signed)
Advanced Heart Failure Clinic Note   Referring Physician: Dr Lindi Adie PCP: Tisovec, Fransico Him, MD PCP-Cardiologist: No primary care provider on file.   HPI: Stephanie Holden is a 42 y.o. female with a history of type 1 DM on insulin pump, thyroid goiter, and newly diagnosed breast cancer.     Malignant neoplasm of axillary tail of right breast in female, estrogen receptor positive ([place])   05/09/2017 Initial Diagnosis    May 2018: Palpable right axillary mass.  The recommended a 6-week follow-up but apparently the mass decreased in size.  Recent increase in the mass was noted, biopsy revealed Grade 1 IDC with DCIS, lymphovascular invasion present, 4 small hypoechoic masses largest 0.9 cm, T1BN1 stage Ib clinical stage      06/05/2017 Surgery    Bilateral mastectomies: Right mastectomy: IDC grade 1, 3 foci 1.2 cm, 1.2 cm, 1 cm, IgA DCIS, LVH diffuse present, margins negative, 4/7 lymph nodes positive with extracapsular extension, ER 95%, PR 95%, HER-2 negative, Ki-67 2% to 10%, T1 cN2 aM0 stage II a pathological stage; left mastectomy: Benign      06/26/2017 Surgery    Axillary lymph node dissection: 2/22 lymph nodes positive (making a total of 6+ lymph nodes)      07/22/2017 -  Chemotherapy    Adjuvant chemotherapy with dose dense Adriamycin and Cytoxan x4 followed by Taxol weekly x12    She presents today as a new breast cancer patient, referred by Dr Lindi Adie. No history of heart problems. Has tolerated AC well. Now on Taxol for 12 weeks. Has fatigue but otherwise doing. Remains active no CP, SOB, orthopnea or PND.   Echo today EF 60-65% LS 11.90 GLS 18.7%  Review of Systems: [y] = yes, [ ] = no   General: Weight gain [ ]; Weight loss [ ]; Anorexia [ ]; Fatigue Blue.Reese ]; Fever [ ]; Chills [ ]; Weakness [ ]  Cardiac: Chest pain/pressure [ ]; Resting SOB [ ]; Exertional SOB [ ]; Orthopnea [ ]; Pedal Edema [ ]; Palpitations [ ]; Syncope [ ]; Presyncope [ ]; Paroxysmal nocturnal  dyspnea[ ]  Pulmonary: Cough [ ]; Wheezing[ ]; Hemoptysis[ ]; Sputum [ ]; Snoring [ ]  GI: Vomiting[ ]; Dysphagia[ ]; Melena[ ]; Hematochezia [ ]; Heartburn[ ]; Abdominal pain [ ]; Constipation [ ]; Diarrhea [ ]; BRBPR [ ]  GU: Hematuria[ ]; Dysuria [ ]; Nocturia[ ]  Vascular: Pain in legs with walking [ ]; Pain in feet with lying flat [ ]; Non-healing sores [ ]; Stroke [ ]; TIA [ ]; Slurred speech [ ];  Neuro: Headaches[ ]; Vertigo[ ]; Seizures[ ]; Paresthesias[ ];Blurred vision [ ]; Diplopia [ ]; Vision changes [ ]  Ortho/Skin: Arthritis [ ]; Joint pain [ ]; Muscle pain [ ]; Joint swelling [ ]; Back Pain [ ]; Rash [ ]  Psych: Depression[ y]; Anxiety[ ]  Heme: Bleeding problems [ ]; Clotting disorders [ ]; Anemia [ ]  Endocrine: Diabetes [ y]; Thyroid dysfunction[y ]   Past Medical History:  Diagnosis Date  . Cancer [facility])    Breast cancer - right  . Constipation   . Depression   . Genetic testing 05/27/2017   Breast/GYN panel (23 genes) @ [facility] - No pathogenic mutations detected  . GERD (gastroesophageal reflux disease)   . Insulin dependent type 1 diabetes mellitus ([place])   . Insulin pump in place   . PONV (postoperative nausea and vomiting)   . Stenosing tenosynovitis of thumb 06/2014  right  . Thyroid goiter     Current Outpatient Medications  Medication Sig Dispense Refill  . acetaminophen (TYLENOL) 500 MG tablet Take 1,000 mg by mouth every 6 (six) hours as needed for moderate pain or headache.    . insulin aspart (NOVOLOG) 100 UNIT/ML injection Inject 45 Units into the skin See admin instructions. Use via insulin pump up to 45 units daily    . lidocaine-prilocaine (EMLA) cream Apply to affected area once (Patient not taking: Reported on 07/09/2017) 30 g 3  . LORazepam (ATIVAN) 0.5 MG tablet Take 1 tablet (0.5 mg total) by mouth every 6 (six) hours as needed for anxiety or sleep. 30 tablet 3  . ondansetron (ZOFRAN) 8 MG tablet Take 1 tablet (8 mg total) by mouth 2 (two) times  daily as needed. Start on the third day after chemotherapy. (Patient not taking: Reported on 07/09/2017) 30 tablet 1  . Probiotic Product (PROBIOTIC DAILY PO) Take 1 capsule by mouth at bedtime.     . valACYclovir (VALTREX) 500 MG tablet Take 1 tablet (500 mg total) by mouth 2 (two) times daily. 60 tablet 3  . venlafaxine XR (EFFEXOR-XR) 37.5 MG 24 hr capsule Take 1 capsule (37.5 mg total) by mouth daily with breakfast. (Patient taking differently: Take 37.5 mg by mouth at bedtime. ) 30 capsule 3   No current facility-administered medications for this encounter.     Allergies  Allergen Reactions  . Dilaudid [Hydromorphone Hcl] Itching and Other (See Comments)    UNCONTROLLABLE CRYING      Social History   Socioeconomic History  . Marital status: Married    Spouse name: Not on file  . Number of children: Not on file  . Years of education: Not on file  . Highest education level: Not on file  Occupational History  . Not on file  Social Needs  . Financial resource strain: Not on file  . Food insecurity:    Worry: Not on file    Inability: Not on file  . Transportation needs:    Medical: Not on file    Non-medical: Not on file  Tobacco Use  . Smoking status: Former Smoker    Last attempt to quit: 03/24/2001    Years since quitting: 16.5  . Smokeless tobacco: Never Used  Substance and Sexual Activity  . Alcohol use: Yes    Alcohol/week: 0.6 oz    Types: 1 Standard drinks or equivalent per week    Comment: occasional  . Drug use: No  . Sexual activity: Yes    Partners: Male    Birth control/protection: Surgical    Comment: tubal ligation & 1 tube removed  Lifestyle  . Physical activity:    Days per week: Not on file    Minutes per session: Not on file  . Stress: Not on file  Relationships  . Social connections:    Talks on phone: Not on file    Gets together: Not on file    Attends religious service: Not on file    Active member of club or organization: Not on file      Attends meetings of clubs or organizations: Not on file    Relationship status: Not on file  . Intimate partner violence:    Fear of current or ex partner: Not on file    Emotionally abused: Not on file    Physically abused: Not on file    Forced sexual activity: Not on file  Other Topics Concern  .  Not on file  Social History Narrative  . Not on file      Family History  Problem Relation Age of Onset  . Hypertension Mother   . Hypertension Father   . Breast cancer Other 37       paternal grandfather's niece    There were no vitals filed for this visit.   PHYSICAL EXAM: General:  Well appearing. No respiratory difficulty HEENT: normal Neck: supple. no JVD. Carotids 2+ bilat; no bruits. No lymphadenopathy or thyromegaly appreciated. Cor: PMI nondisplaced. Regular rate & rhythm. No rubs, gallops or murmurs. Left port  Lungs: clear Abdomen: soft, nontender, nondistended. No hepatosplenomegaly. No bruits or masses. Good bowel sounds. Extremities: no cyanosis, clubbing, rash, edema Neuro: alert & oriented x 3, cranial nerves grossly intact. moves all 4 extremities w/o difficulty. Affect pleasant.  ASSESSMENT & PLAN:  1. Malignant neoplasm of axillary tail of right breast in female, estrogen receptor positive  - She was diagnosed with breast cancer 05/09/17. Underwent bilateral mastectomies on 06/05/17. Underwent axillary lymph node dissection on 06/26/17. Started chemo 07/22/17 with adriamycin and cytoxan x4, then taxol weekly x12.  - Echo 06/20/17: EF 55-60%, trivial TR. - Echo today EF 60-65% LS 11.90 GLS 18.7% - I reviewed echos personally. EF and Doppler parameters stable. No HF on exam. Will see back in 6 months for repeat echo to ensure stability. I explained incidence of Adriamycin cardiotoxicity in detail include small possibility of very delayed toxicity.     Glori Bickers, MD  11:58 AM

## 2017-09-19 NOTE — Progress Notes (Signed)
  Echocardiogram 2D Echocardiogram has been performed.  Stephanie Holden 09/19/2017, 10:59 AM

## 2017-09-19 NOTE — Patient Instructions (Signed)
Your physician has requested that you have an echocardiogram. Echocardiography is a painless test that uses sound waves to create images of your heart. It provides your doctor with information about the size and shape of your heart and how well your heart's chambers and valves are working. This procedure takes approximately one hour. There are no restrictions for this procedure.  Your physician recommends that you schedule a follow-up appointment in: 6 months with Dr. Haroldine Laws  an a echocardiogram

## 2017-09-21 ENCOUNTER — Other Ambulatory Visit: Payer: Self-pay | Admitting: Hematology and Oncology

## 2017-09-22 ENCOUNTER — Telehealth: Payer: Self-pay

## 2017-09-22 ENCOUNTER — Other Ambulatory Visit: Payer: Self-pay

## 2017-09-22 ENCOUNTER — Other Ambulatory Visit: Payer: Self-pay | Admitting: Hematology and Oncology

## 2017-09-22 NOTE — Progress Notes (Signed)
I removed Decadron from her treatment plan because of her diabetes.

## 2017-09-22 NOTE — Telephone Encounter (Signed)
I removed Decadron for treatment from her treatment plan

## 2017-09-22 NOTE — Telephone Encounter (Signed)
Spoke with patient to follow up with first time Taxol infusion.  Patient concerned with receiving Decadron 20mg  as part of pre medications d/t being diabetic.  Patient stated if Dr. Lindi Adie thinks medication is needed she will plan accordingly, however, would like this to be reviewed.

## 2017-09-29 ENCOUNTER — Ambulatory Visit: Payer: BLUE CROSS/BLUE SHIELD | Attending: General Surgery

## 2017-09-30 ENCOUNTER — Encounter: Payer: Self-pay | Admitting: *Deleted

## 2017-09-30 ENCOUNTER — Inpatient Hospital Stay: Payer: BLUE CROSS/BLUE SHIELD

## 2017-09-30 ENCOUNTER — Other Ambulatory Visit: Payer: Self-pay | Admitting: Hematology and Oncology

## 2017-09-30 ENCOUNTER — Inpatient Hospital Stay: Payer: BLUE CROSS/BLUE SHIELD | Attending: Hematology and Oncology

## 2017-09-30 VITALS — BP 154/91 | HR 95 | Temp 98.9°F | Resp 16

## 2017-09-30 DIAGNOSIS — C50911 Malignant neoplasm of unspecified site of right female breast: Secondary | ICD-10-CM

## 2017-09-30 DIAGNOSIS — C50611 Malignant neoplasm of axillary tail of right female breast: Secondary | ICD-10-CM | POA: Insufficient documentation

## 2017-09-30 DIAGNOSIS — C773 Secondary and unspecified malignant neoplasm of axilla and upper limb lymph nodes: Secondary | ICD-10-CM | POA: Insufficient documentation

## 2017-09-30 DIAGNOSIS — Z5111 Encounter for antineoplastic chemotherapy: Secondary | ICD-10-CM | POA: Diagnosis not present

## 2017-09-30 DIAGNOSIS — Z87891 Personal history of nicotine dependence: Secondary | ICD-10-CM | POA: Diagnosis not present

## 2017-09-30 DIAGNOSIS — Z006 Encounter for examination for normal comparison and control in clinical research program: Secondary | ICD-10-CM

## 2017-09-30 DIAGNOSIS — Z170 Estrogen receptor positive status [ER+]: Secondary | ICD-10-CM | POA: Diagnosis not present

## 2017-09-30 LAB — CMP (CANCER CENTER ONLY)
ALBUMIN: 3.6 g/dL (ref 3.5–5.0)
ALT: 66 U/L — ABNORMAL HIGH (ref 0–44)
ANION GAP: 7 (ref 5–15)
AST: 40 U/L (ref 15–41)
Alkaline Phosphatase: 88 U/L (ref 38–126)
BILIRUBIN TOTAL: 0.3 mg/dL (ref 0.3–1.2)
BUN: 9 mg/dL (ref 6–20)
CHLORIDE: 105 mmol/L (ref 98–111)
CO2: 30 mmol/L (ref 22–32)
Calcium: 9.4 mg/dL (ref 8.9–10.3)
Creatinine: 0.75 mg/dL (ref 0.44–1.00)
GFR, Est AFR Am: >60 mL/min (ref >60)
GFR, Estimated: >60 mL/min (ref >60)
GLUCOSE: 122 mg/dL — AB (ref 70–99)
POTASSIUM: 4.1 mmol/L (ref 3.5–5.1)
Sodium: 142 mmol/L (ref 135–145)
TOTAL PROTEIN: 6.9 g/dL (ref 6.5–8.1)

## 2017-09-30 LAB — CBC WITH DIFFERENTIAL (CANCER CENTER ONLY)
BASOS PCT: 2 %
Basophils Absolute: 0.1 10*3/uL (ref 0.0–0.1)
EOS ABS: 0 10*3/uL (ref 0.0–0.5)
EOS PCT: 1 %
HCT: 32.4 % — ABNORMAL LOW (ref 34.8–46.6)
Hemoglobin: 10.7 g/dL — ABNORMAL LOW (ref 11.6–15.9)
Lymphocytes Relative: 25 %
Lymphs Abs: 1 10*3/uL (ref 0.9–3.3)
MCH: 27.1 pg (ref 25.1–34.0)
MCHC: 33.1 g/dL (ref 31.5–36.0)
MCV: 81.7 fL (ref 79.5–101.0)
MONOS PCT: 18 %
Monocytes Absolute: 0.7 10*3/uL (ref 0.1–0.9)
Neutro Abs: 2.1 10*3/uL (ref 1.5–6.5)
Neutrophils Relative %: 54 %
PLATELETS: 444 10*3/uL — AB (ref 145–400)
RBC: 3.97 MIL/uL (ref 3.70–5.45)
RDW: 25 % — AB (ref 11.2–14.5)
WBC Count: 3.8 10*3/uL — ABNORMAL LOW (ref 3.9–10.3)

## 2017-09-30 MED ORDER — SODIUM CHLORIDE 0.9% FLUSH
10.0000 mL | INTRAVENOUS | Status: DC | PRN
  Administered 2017-09-30: 10 mL
  Filled 2017-09-30: qty 10

## 2017-09-30 MED ORDER — DIPHENHYDRAMINE HCL 25 MG PO CAPS
ORAL_CAPSULE | ORAL | Status: AC
  Filled 2017-09-30: qty 1

## 2017-09-30 MED ORDER — FAMOTIDINE IN NACL 20-0.9 MG/50ML-% IV SOLN
INTRAVENOUS | Status: AC
  Filled 2017-09-30: qty 50

## 2017-09-30 MED ORDER — DIPHENHYDRAMINE HCL 25 MG PO CAPS
25.0000 mg | ORAL_CAPSULE | Freq: Once | ORAL | Status: AC
  Administered 2017-09-30: 25 mg via ORAL

## 2017-09-30 MED ORDER — HEPARIN SOD (PORK) LOCK FLUSH 100 UNIT/ML IV SOLN
500.0000 [IU] | Freq: Once | INTRAVENOUS | Status: AC | PRN
  Administered 2017-09-30: 500 [IU]
  Filled 2017-09-30: qty 5

## 2017-09-30 MED ORDER — SODIUM CHLORIDE 0.9 % IV SOLN
80.0000 mg/m2 | Freq: Once | INTRAVENOUS | Status: AC
  Administered 2017-09-30: 144 mg via INTRAVENOUS
  Filled 2017-09-30: qty 24

## 2017-09-30 MED ORDER — SODIUM CHLORIDE 0.9 % IV SOLN
Freq: Once | INTRAVENOUS | Status: AC
  Administered 2017-09-30: 10:00:00 via INTRAVENOUS

## 2017-09-30 MED ORDER — FAMOTIDINE IN NACL 20-0.9 MG/50ML-% IV SOLN
40.0000 mg | Freq: Once | INTRAVENOUS | Status: AC
  Administered 2017-09-30: 40 mg via INTRAVENOUS

## 2017-09-30 NOTE — Patient Instructions (Signed)
[facility] Discharge Instructions for Patients Receiving Chemotherapy  Today you received the following chemotherapy agents:  Taxol.  To help prevent nausea and vomiting after your treatment, we encourage you to take your nausea medication as directed.   If you develop nausea and vomiting that is not controlled by your nausea medication, call the clinic.   BELOW ARE SYMPTOMS THAT SHOULD BE REPORTED IMMEDIATELY:  *FEVER GREATER THAN 100.5 F  *CHILLS WITH OR WITHOUT FEVER  NAUSEA AND VOMITING THAT IS NOT CONTROLLED WITH YOUR NAUSEA MEDICATION  *UNUSUAL SHORTNESS OF BREATH  *UNUSUAL BRUISING OR BLEEDING  TENDERNESS IN MOUTH AND THROAT WITH OR WITHOUT PRESENCE OF ULCERS  *URINARY PROBLEMS  *BOWEL PROBLEMS  UNUSUAL RASH Items with * indicate a potential emergency and should be followed up as soon as possible.  Feel free to call the clinic should you have any questions or concerns. The clinic phone number is [phone].  Please show the CHEMO ALERT CARD at check-in to the Emergency Department and triage nurse.   

## 2017-09-30 NOTE — Progress Notes (Signed)
3 MONTH ACTIVITIES FOR UPBEAT ([id number]) STUDY;  Patient's 3 months visit from starting on chemotherapy is 10/20/17.   Met with patient in infusion room today to review and schedule her 3 month activities which can be done +/- 30 days from the 3 month time point (09/20/17-11/20/17).  Reviewed required study activities with patient and agreed on the following schedule; 1. Questionnaires- patient completed today during her infusion. Research nurse collected and checked for completeness. Depression screening score = 3, no need to report to MD.  2. Review Concomitant Medications- completed with patient today during infusion.   3. Research Labs- to be done on next lab appointment 10/07/17.  Instructed patient to fast for 3 hrs prior to lab appointment and she verbalized understanding.  3. Cardiac MRI- scheduled for 10/15/17 at 10 am. Instructed patient to arrive to MRI at 9:30 am on 7/24 then come over to [place] to meet with research nurse.    4. Neurocognitive, Physical Functions testing and VS- by research nurse on 7/24 after Cardiac MRI. Thanked patient for her time today and continued participation in this clinical trial.  Encouraged her to contact research nurse if any questions or concerns prior to next visit. She verbalized understanding.  Foye Spurling, BSN, RN Clinical Research Nurse 09/30/2017 11:55 AM

## 2017-10-06 ENCOUNTER — Telehealth: Payer: Self-pay | Admitting: *Deleted

## 2017-10-06 NOTE — Telephone Encounter (Signed)
[id number] Research Note: Called patient to remind her to fast for 3 hours prior to her lab appointment in the morning.  Reminded her of 3 month research labs for the Upbeat study being drawn along with her usual labs.  She verbalized understanding.  Foye Spurling, BSN, RN Clinical Research Nurse 10/06/2017 4:50 PM

## 2017-10-07 ENCOUNTER — Ambulatory Visit: Payer: BLUE CROSS/BLUE SHIELD | Admitting: Hematology and Oncology

## 2017-10-07 ENCOUNTER — Inpatient Hospital Stay (HOSPITAL_BASED_OUTPATIENT_CLINIC_OR_DEPARTMENT_OTHER): Payer: BLUE CROSS/BLUE SHIELD | Admitting: Adult Health

## 2017-10-07 ENCOUNTER — Other Ambulatory Visit: Payer: BLUE CROSS/BLUE SHIELD

## 2017-10-07 ENCOUNTER — Inpatient Hospital Stay: Payer: BLUE CROSS/BLUE SHIELD

## 2017-10-07 ENCOUNTER — Encounter: Payer: Self-pay | Admitting: Adult Health

## 2017-10-07 VITALS — BP 139/95 | HR 89 | Temp 98.4°F | Resp 18 | Ht 62.0 in | Wt 169.4 lb

## 2017-10-07 DIAGNOSIS — C773 Secondary and unspecified malignant neoplasm of axilla and upper limb lymph nodes: Principal | ICD-10-CM

## 2017-10-07 DIAGNOSIS — C50611 Malignant neoplasm of axillary tail of right female breast: Secondary | ICD-10-CM | POA: Diagnosis not present

## 2017-10-07 DIAGNOSIS — Z170 Estrogen receptor positive status [ER+]: Secondary | ICD-10-CM

## 2017-10-07 DIAGNOSIS — Z006 Encounter for examination for normal comparison and control in clinical research program: Secondary | ICD-10-CM

## 2017-10-07 DIAGNOSIS — C50911 Malignant neoplasm of unspecified site of right female breast: Secondary | ICD-10-CM

## 2017-10-07 DIAGNOSIS — Z87891 Personal history of nicotine dependence: Secondary | ICD-10-CM | POA: Diagnosis not present

## 2017-10-07 DIAGNOSIS — Z5111 Encounter for antineoplastic chemotherapy: Secondary | ICD-10-CM | POA: Diagnosis not present

## 2017-10-07 LAB — CBC WITH DIFFERENTIAL (CANCER CENTER ONLY)
BASOS PCT: 2 %
Basophils Absolute: 0.1 10*3/uL (ref 0.0–0.1)
EOS ABS: 0.1 10*3/uL (ref 0.0–0.5)
EOS PCT: 1 %
HCT: 32 % — ABNORMAL LOW (ref 34.8–46.6)
Hemoglobin: 10.6 g/dL — ABNORMAL LOW (ref 11.6–15.9)
LYMPHS ABS: 1 10*3/uL (ref 0.9–3.3)
Lymphocytes Relative: 27 %
MCH: 27 pg (ref 25.1–34.0)
MCHC: 33.1 g/dL (ref 31.5–36.0)
MCV: 81.5 fL (ref 79.5–101.0)
Monocytes Absolute: 0.3 10*3/uL (ref 0.1–0.9)
Monocytes Relative: 8 %
NEUTROS PCT: 62 %
Neutro Abs: 2.4 10*3/uL (ref 1.5–6.5)
PLATELETS: 341 10*3/uL (ref 145–400)
RBC: 3.93 MIL/uL (ref 3.70–5.45)
RDW: 25.2 % — ABNORMAL HIGH (ref 11.2–14.5)
WBC: 3.9 10*3/uL (ref 3.9–10.3)

## 2017-10-07 LAB — CMP (CANCER CENTER ONLY)
ALBUMIN: 3.8 g/dL (ref 3.5–5.0)
ALT: 100 U/L — ABNORMAL HIGH (ref 0–44)
ANION GAP: 7 (ref 5–15)
AST: 57 U/L — ABNORMAL HIGH (ref 15–41)
Alkaline Phosphatase: 91 U/L (ref 38–126)
BUN: 9 mg/dL (ref 6–20)
CHLORIDE: 104 mmol/L (ref 98–111)
CO2: 29 mmol/L (ref 22–32)
Calcium: 9.4 mg/dL (ref 8.9–10.3)
Creatinine: 0.78 mg/dL (ref 0.44–1.00)
GFR, Estimated: >60 mL/min (ref >60)
GLUCOSE: 162 mg/dL — AB (ref 70–99)
Potassium: 4 mmol/L (ref 3.5–5.1)
SODIUM: 140 mmol/L (ref 135–145)
Total Bilirubin: 0.4 mg/dL (ref 0.3–1.2)
Total Protein: 7.4 g/dL (ref 6.5–8.1)

## 2017-10-07 LAB — RESEARCH LABS

## 2017-10-07 MED ORDER — SODIUM CHLORIDE 0.9% FLUSH
10.0000 mL | INTRAVENOUS | Status: DC | PRN
  Administered 2017-10-07: 10 mL
  Filled 2017-10-07: qty 10

## 2017-10-07 MED ORDER — SODIUM CHLORIDE 0.9 % IV SOLN
80.0000 mg/m2 | Freq: Once | INTRAVENOUS | Status: AC
  Administered 2017-10-07: 144 mg via INTRAVENOUS
  Filled 2017-10-07: qty 24

## 2017-10-07 MED ORDER — SODIUM CHLORIDE 0.9 % IV SOLN
Freq: Once | INTRAVENOUS | Status: AC
  Administered 2017-10-07: 10:00:00 via INTRAVENOUS

## 2017-10-07 MED ORDER — HEPARIN SOD (PORK) LOCK FLUSH 100 UNIT/ML IV SOLN
500.0000 [IU] | Freq: Once | INTRAVENOUS | Status: AC | PRN
  Administered 2017-10-07: 500 [IU]
  Filled 2017-10-07: qty 5

## 2017-10-07 MED ORDER — DIPHENHYDRAMINE HCL 25 MG PO CAPS
ORAL_CAPSULE | ORAL | Status: AC
  Filled 2017-10-07: qty 1

## 2017-10-07 MED ORDER — DIPHENHYDRAMINE HCL 25 MG PO CAPS
25.0000 mg | ORAL_CAPSULE | Freq: Once | ORAL | Status: AC
  Administered 2017-10-07: 25 mg via ORAL

## 2017-10-07 MED ORDER — PACLITAXEL CHEMO INJECTION 300 MG/50ML
80.0000 mg/m2 | Freq: Once | INTRAVENOUS | Status: DC
  Filled 2017-10-07: qty 24

## 2017-10-07 MED ORDER — FAMOTIDINE IN NACL 20-0.9 MG/50ML-% IV SOLN
40.0000 mg | Freq: Once | INTRAVENOUS | Status: AC
  Administered 2017-10-07: 40 mg via INTRAVENOUS

## 2017-10-07 MED ORDER — FAMOTIDINE IN NACL 20-0.9 MG/50ML-% IV SOLN
INTRAVENOUS | Status: AC
  Filled 2017-10-07: qty 100

## 2017-10-07 NOTE — Assessment & Plan Note (Signed)
06/05/2017:Bilateral mastectomies: Right mastectomy: IDC grade 1, 3 foci 1.2 cm, 1.2 cm, 1 cm, IgA DCIS, LVH diffuse present, margins negative, 4/7 lymph nodes positive with extracapsular extension, ER 95%, PR 95%, HER-2 negative, Ki-67 2% to 10%, T1 cN2 aM0 stage II a pathological stage; left mastectomy: Benign  Mammaprint done preoperatively: Low risk Axillary lymph node dissection 06/26/2017: 2/22 lymph nodes positive(making a total of 6 lymph nodes that were positive)  Treatment plan: 1.Because ofher high risk features (6 LN Pos),werecommended adjuvant systemic chemotherapywithdose dense Adriamycin and Cytoxan x4 followed by Taxol weekly x12 started 07/22/2017 3.Followed by radiation.  4.Followed by adjuvant antiestrogen therapy  UPBEAT clinical trial ([id number]):No toxicities related to the trial --------------------------------------------------------------------------- Current treatment: Completed 4 cycles ofdose dense Adriamycin and Cytoxan, today cycle 3 Taxol Labs reviewed Echocardiogram 06/20/2017: EF 55 to 60%  Stephanie Holden is doing well.  She is tolerating treatment well.  CBC is stable.  CMET pending.  To proceed with Taxol today so long as CMET is within parameters.  We reviewed her treatment plan again today, and I reviewed her chemo orders, and verified her premedications were accurate.    She will return weekly for labs and chemo, and will see myself or Dr. Lindi Adie with every other treatment.

## 2017-10-07 NOTE — Progress Notes (Signed)
Okay to treat with ALT 100 per Dr.Gudena. Notified infusion RN.

## 2017-10-07 NOTE — Progress Notes (Signed)
[facility] Follow up:    Tisovec, Fransico Him, MD [place] [id number]   DIAGNOSIS: Cancer Staging Malignant neoplasm of axillary tail of right breast in female, estrogen receptor positive ([place]) Staging form: Breast, AJCC 8th Edition - Clinical stage from 05/14/2017: Stage IB (cT1c, cN1, cM0, G1, ER: Positive, PR: Positive, HER2: Negative) - Unsigned   SUMMARY OF ONCOLOGIC HISTORY:   Malignant neoplasm of axillary tail of right breast in female, estrogen receptor positive ([place])   05/09/2017 Initial Diagnosis    May 2018: Palpable right axillary mass.  The recommended a 6-week follow-up but apparently the mass decreased in size.  Recent increase in the mass was noted, biopsy revealed Grade 1 IDC with DCIS, lymphovascular invasion present, 4 small hypoechoic masses largest 0.9 cm, T1BN1 stage Ib clinical stage      06/05/2017 Surgery    Bilateral mastectomies: Right mastectomy: IDC grade 1, 3 foci 1.2 cm, 1.2 cm, 1 cm, IgA DCIS, LVH diffuse present, margins negative, 4/7 lymph nodes positive with extracapsular extension, ER 95%, PR 95%, HER-2 negative, Ki-67 2% to 10%, T1 cN2 aM0 stage II a pathological stage; left mastectomy: Benign      06/26/2017 Surgery    Axillary lymph node dissection: 2/22 lymph nodes positive (making a total of 6+ lymph nodes)      07/22/2017 -  Chemotherapy    Adjuvant chemotherapy with dose dense Adriamycin and Cytoxan x4 followed by Taxol weekly x12        Breast cancer metastasized to axillary lymph node, right ([place])   06/05/2017 Initial Diagnosis    Breast cancer metastasized to axillary lymph node, right ([place])      07/22/2017 -  Chemotherapy    Adjuvant chemotherapy with dose dense Adriamycin and Cytoxan x4 followed by Taxol weekly x12        CURRENT THERAPY: Taxol week 3  INTERVAL HISTORY: Stephanie Holden 42 y.o. female returns for evaluation prior to receiving Taxol.  She is doing well today.  She states much  better with lower benadryl doses and without dexamethasone.  She denies peripheral neuropathy.     Patient Active Problem List   Diagnosis Date Noted  . Breast cancer metastasized to axillary lymph node, right ([place]) 06/05/2017  . Genetic testing 05/27/2017  . Malignant neoplasm of axillary tail of right breast in female, estrogen receptor positive ([place]) 05/09/2017  . Cubital tunnel syndrome on left 10/02/2015  . Trigger finger, left index finger 04/12/2015  . Erroneous encounter - disregard 09/22/2014  . Diabetes type 1, controlled ([place]) 01/12/2013    is allergic to dilaudid [hydromorphone hcl].  MEDICAL HISTORY: Past Medical History:  Diagnosis Date  . Cancer [facility])    Breast cancer - right  . Constipation   . Depression   . Genetic testing 05/27/2017   Breast/GYN panel (23 genes) @ [facility] - No pathogenic mutations detected  . GERD (gastroesophageal reflux disease)   . Insulin dependent type 1 diabetes mellitus ([place])   . Insulin pump in place   . PONV (postoperative nausea and vomiting)   . Stenosing tenosynovitis of thumb 06/2014   right  . Thyroid goiter     SURGICAL HISTORY: Past Surgical History:  Procedure Laterality Date  . AXILLARY LYMPH NODE DISSECTION Right 06/26/2017   Procedure: AXILLARY LYMPH NODE DISSECTION;  Surgeon: [name], MD;  Location: [place];  Service: General;  Laterality: Right;  . CARPAL TUNNEL RELEASE Bilateral 10/28/2013   Procedure: BILATERAL CARPAL TUNNEL  RELEASE;  Surgeon: Daryll Brod, MD;  Location: [place];  Service: Orthopedics;  Laterality: Bilateral;  . CESAREAN SECTION  2000; 08/20/2001  . ESSURE TUBAL LIGATION  2005   failed, tubal puncture  . LAPAROSCOPIC TUBAL LIGATION  04/02/2007   removal Essure  . LAPAROSCOPIC UNILATERAL SALPINGECTOMY  2005  . MASTECTOMY W/ SENTINEL NODE BIOPSY Bilateral 06/05/2017   Procedure: BILATERAL MASTECTOMIES  WITH RIGHT SENTINEL LYMPH NODE BIOPSY;  Surgeon: [name], MD;  Location: [place];  Service: General;  Laterality: Bilateral;  . PORTACATH PLACEMENT Left 06/26/2017   Procedure: INSERTION PORT-A-CATH;  Surgeon: [name], MD;  Location: [place];  Service: General;  Laterality: Left;  . TRIGGER FINGER RELEASE Left 04/12/2014   Procedure: RELEASE A-1 PULLEY LEFT THUMB;  Surgeon: Daryll Brod, MD;  Location: [place];  Service: Orthopedics;  Laterality: Left;  . TRIGGER FINGER RELEASE Right 07/19/2014   Procedure: RELEASE TRIGGER FINGER/A-1 PULLEY RIGHT THUMB;  Surgeon: Daryll Brod, MD;  Location: [place];  Service: Orthopedics;  Laterality: Right;    SOCIAL HISTORY: Social History   Socioeconomic History  . Marital status: Married    Spouse name: Not on file  . Number of children: Not on file  . Years of education: Not on file  . Highest education level: Not on file  Occupational History  . Not on file  Social Needs  . Financial resource strain: Not on file  . Food insecurity:    Worry: Not on file    Inability: Not on file  . Transportation needs:    Medical: Not on file    Non-medical: Not on file  Tobacco Use  . Smoking status: Former Smoker    Last attempt to quit: 03/24/2001    Years since quitting: 16.5  . Smokeless tobacco: Never Used  Substance and Sexual Activity  . Alcohol use: Yes    Alcohol/week: 0.6 oz    Types: 1 Standard drinks or equivalent per week    Comment: occasional  . Drug use: No  . Sexual activity: Yes    Partners: Male    Birth control/protection: Surgical    Comment: tubal ligation & 1 tube removed  Lifestyle  . Physical activity:    Days per week: Not on file    Minutes per session: Not on file  . Stress: Not on file  Relationships  . Social connections:    Talks on phone: Not on file    Gets together: Not on file    Attends religious service: Not on file    Active member of club or organization: Not on file    Attends meetings of clubs or organizations: Not on file    Relationship  status: Not on file  . Intimate partner violence:    Fear of current or ex partner: Not on file    Emotionally abused: Not on file    Physically abused: Not on file    Forced sexual activity: Not on file  Other Topics Concern  . Not on file  Social History Narrative  . Not on file    FAMILY HISTORY: Family History  Problem Relation Age of Onset  . Hypertension Mother   . Hypertension Father   . Breast cancer Other 16       paternal grandfather's niece    Review of Systems  Constitutional: Positive for fatigue. Negative for appetite change, chills, fever and unexpected weight change.  HENT:   Negative for hearing  loss, lump/mass, sore throat and voice change.   Eyes: Negative for eye problems and icterus.  Respiratory: Negative for chest tightness, cough and shortness of breath.   Cardiovascular: Negative for chest pain and leg swelling.  Gastrointestinal: Negative for abdominal distention, abdominal pain, constipation, diarrhea, nausea and vomiting.  Endocrine: Negative for hot flashes.  Genitourinary: Negative for difficulty urinating.   Skin: Negative for itching and rash.  Neurological: Negative for dizziness, extremity weakness, headaches and numbness.  Hematological: Negative for adenopathy. Does not bruise/bleed easily.  Psychiatric/Behavioral: Negative for depression. The patient is not nervous/anxious.       PHYSICAL EXAMINATION  ECOG PERFORMANCE STATUS: 1 - Symptomatic but completely ambulatory  Vitals:   10/07/17 0912  BP: (!) 139/95  Pulse: 89  Resp: 18  Temp: 98.4 F (36.9 C)  SpO2: 99%    Physical Exam  Constitutional: She is oriented to person, place, and time. She appears well-developed and well-nourished.  HENT:  Head: Normocephalic and atraumatic.  Mouth/Throat: Oropharynx is clear and moist. No oropharyngeal exudate.  Eyes: Pupils are equal, round, and reactive to light. No scleral icterus.  Neck: Neck supple.  Cardiovascular: Normal rate,  regular rhythm and normal heart sounds.  Pulmonary/Chest: Effort normal and breath sounds normal.  Abdominal: Soft. Bowel sounds are normal. She exhibits no distension and no mass. There is no tenderness. There is no rebound and no guarding.  Musculoskeletal: She exhibits no edema.  Lymphadenopathy:    She has no cervical adenopathy.  Neurological: She is alert and oriented to person, place, and time.  Skin: Capillary refill takes less than 2 seconds.  Psychiatric: She has a normal mood and affect.    LABORATORY DATA:  CBC    Component Value Date/Time   WBC 3.9 10/07/2017 0820   WBC 13.7 (H) 06/27/2017 0702   RBC 3.93 10/07/2017 0820   HGB 10.6 (L) 10/07/2017 0820   HGB 14.8 04/29/2017 1322   HCT 32.0 (L) 10/07/2017 0820   HCT 46.1 04/29/2017 1322   PLT 341 10/07/2017 0820   PLT 324 04/29/2017 1322   MCV 81.5 10/07/2017 0820   MCV 90 04/29/2017 1322   MCH 27.0 10/07/2017 0820   MCHC 33.1 10/07/2017 0820   RDW 25.2 (H) 10/07/2017 0820   RDW 13.8 04/29/2017 1322   LYMPHSABS 1.0 10/07/2017 0820   LYMPHSABS 2.2 04/29/2017 1322   MONOABS 0.3 10/07/2017 0820   EOSABS 0.1 10/07/2017 0820   EOSABS 0.1 04/29/2017 1322   BASOSABS 0.1 10/07/2017 0820   BASOSABS 0.1 04/29/2017 1322    CMP     Component Value Date/Time   NA 140 10/07/2017 0820   K 4.0 10/07/2017 0820   CL 104 10/07/2017 0820   CO2 29 10/07/2017 0820   GLUCOSE 162 (H) 10/07/2017 0820   BUN 9 10/07/2017 0820   CREATININE 0.78 10/07/2017 0820   CALCIUM 9.4 10/07/2017 0820   PROT 7.4 10/07/2017 0820   ALBUMIN 3.8 10/07/2017 0820   AST 57 (H) 10/07/2017 0820   ALT 100 (H) 10/07/2017 0820   ALKPHOS 91 10/07/2017 0820   BILITOT 0.4 10/07/2017 0820   GFRNONAA >60 10/07/2017 0820   GFRAA >60 10/07/2017 0820     ASSESSMENT and PLAN:   Malignant neoplasm of axillary tail of right breast in female, estrogen receptor positive ([place]) 06/05/2017:Bilateral mastectomies: Right mastectomy: IDC grade 1, 3 foci 1.2 cm,  1.2 cm, 1 cm, IgA DCIS, LVH diffuse present, margins negative, 4/7 lymph nodes positive with extracapsular extension, ER 95%,  PR 95%, HER-2 negative, Ki-67 2% to 10%, T1 cN2 aM0 stage II a pathological stage; left mastectomy: Benign  Mammaprint done preoperatively: Low risk Axillary lymph node dissection 06/26/2017: 2/22 lymph nodes positive(making a total of 6 lymph nodes that were positive)  Treatment plan: 1.Because ofher high risk features (6 LN Pos),werecommended adjuvant systemic chemotherapywithdose dense Adriamycin and Cytoxan x4 followed by Taxol weekly x12 started 07/22/2017 3.Followed by radiation.  4.Followed by adjuvant antiestrogen therapy  UPBEAT clinical trial ([id number]):No toxicities related to the trial --------------------------------------------------------------------------- Current treatment: Completed 4 cycles ofdose dense Adriamycin and Cytoxan, today cycle 3 Taxol Labs reviewed Echocardiogram 06/20/2017: EF 55 to 60%  Maleiya is doing well.  She is tolerating treatment well.  CBC is stable.  CMET pending.  To proceed with Taxol today so long as CMET is within parameters.  We reviewed her treatment plan again today, and I reviewed her chemo orders, and verified her premedications were accurate.    She will return weekly for labs and chemo, and will see myself or Dr. Lindi Adie with every other treatment.     All questions were answered. The patient knows to call the clinic with any problems, questions or concerns. We can certainly see the patient much sooner if necessary.  A total of (30) minutes of face-to-face time was spent with this patient with greater than 50% of that time in counseling and care-coordination.  This note was electronically signed. Scot Dock, NP 10/07/2017

## 2017-10-07 NOTE — Progress Notes (Signed)
Per Dr. Lindi Adie it is ok to treat with ALT of 100

## 2017-10-07 NOTE — Patient Instructions (Signed)

## 2017-10-08 ENCOUNTER — Telehealth: Payer: Self-pay | Admitting: Adult Health

## 2017-10-08 NOTE — Progress Notes (Signed)
42 y.o. [id number] Married  Caucasian Fe here for annual exam. Patient currently being treated for breast cancer with Taxol and radiation. Coping well with very good family support. 6 more weeks of radiation to go. Shaved hair to stop the itching with loss and feels so much better. Denies vaginal bleeding or period since starting treatment. Still having some scar issues on left, but will be seeing surgeon soon.Some fatigue, but not an issue. No vaginal dryness that she has noted. Diabetes stable with no change, with Endocrine management. BP being monitored, no  Medication at this time. No other health concerns today.  Patient's last menstrual period was 06/09/2017 (exact date).          Sexually active: Yes.    The current method of family planning is tubal ligation.    Exercising: No.  exercise Smoker:  Former smoker   Health Maintenance: Pap:  10-08-16 atypical glandular cells          11-05-16 colposcopy biopsies normal  History of Abnormal Pap: yes MMG:  Breast cancer, see epic reports, mastectomy Self Breast exams: yes, mastectomy Colonoscopy:  none BMD:   Bone scan was done TDaP:  2013  Shingles: no Pneumonia: no Hep C and HIV: not done Labs: with oncology declines today   reports that she quit smoking about 16 years ago. She has never used smokeless tobacco. She reports that she drank alcohol. She reports that she does not use drugs.  Past Medical History:  Diagnosis Date  . Cancer [facility])    Breast cancer - right  . Constipation   . Depression   . Genetic testing 05/27/2017   Breast/GYN panel (23 genes) @ [facility] - No pathogenic mutations detected  . GERD (gastroesophageal reflux disease)   . Insulin dependent type 1 diabetes mellitus ([place])   . Insulin pump in place   . PONV (postoperative nausea and vomiting)   . Stenosing tenosynovitis of thumb 06/2014   right  . Thyroid goiter     Past Surgical History:  Procedure Laterality Date  . AXILLARY LYMPH NODE DISSECTION Right  06/26/2017   Procedure: AXILLARY LYMPH NODE DISSECTION;  Surgeon: [name], MD;  Location: [place];  Service: General;  Laterality: Right;  . CARPAL TUNNEL RELEASE Bilateral 10/28/2013   Procedure: BILATERAL CARPAL TUNNEL RELEASE;  Surgeon: Daryll Brod, MD;  Location: [place];  Service: Orthopedics;  Laterality: Bilateral;  . CESAREAN SECTION  2000; 08/20/2001  . ESSURE TUBAL LIGATION  2005   failed, tubal puncture  . LAPAROSCOPIC TUBAL LIGATION  04/02/2007   removal Essure  . LAPAROSCOPIC UNILATERAL SALPINGECTOMY  2005  . MASTECTOMY W/ SENTINEL NODE BIOPSY Bilateral 06/05/2017   Procedure: BILATERAL MASTECTOMIES  WITH RIGHT SENTINEL LYMPH NODE BIOPSY;  Surgeon: [name], MD;  Location: [place];  Service: General;  Laterality: Bilateral;  . PORTACATH PLACEMENT Left 06/26/2017   Procedure: INSERTION PORT-A-CATH;  Surgeon: [name], MD;  Location: [place];  Service: General;  Laterality: Left;  . TRIGGER FINGER RELEASE Left 04/12/2014   Procedure: RELEASE A-1 PULLEY LEFT THUMB;  Surgeon: Daryll Brod, MD;  Location: [place];  Service: Orthopedics;  Laterality: Left;  . TRIGGER FINGER RELEASE Right 07/19/2014   Procedure: RELEASE TRIGGER FINGER/A-1 PULLEY RIGHT THUMB;  Surgeon: Daryll Brod, MD;  Location: [place];  Service: Orthopedics;  Laterality: Right;    Current Outpatient Medications  Medication Sig Dispense Refill  . acetaminophen (TYLENOL) 500 MG tablet Take 1,000 mg by mouth  every 6 (six) hours as needed for moderate pain or headache.    . insulin aspart (NOVOLOG) 100 UNIT/ML injection Inject 45 Units into the skin See admin instructions. Use via insulin pump up to 45 units daily    . lidocaine-prilocaine (EMLA) cream Apply to affected area once 30 g 3  . LORazepam (ATIVAN) 0.5 MG tablet Take 1 tablet (0.5 mg total) by mouth every 6 (six) hours as needed for anxiety or sleep. 30 tablet 3  . Probiotic Product (PROBIOTIC DAILY PO) Take 1  capsule by mouth at bedtime.     Marland Kitchen venlafaxine XR (EFFEXOR-XR) 37.5 MG 24 hr capsule TAKE 1 CAPSULE BY MOUTH EVERY DAY WITH BREAKFAST 30 capsule 3  . ondansetron (ZOFRAN) 8 MG tablet Take 1 tablet (8 mg total) by mouth 2 (two) times daily as needed. Start on the third day after chemotherapy. (Patient not taking: Reported on 10/09/2017) 30 tablet 1   No current facility-administered medications for this visit.     Family History  Problem Relation Age of Onset  . Hypertension Mother   . Hypertension Father   . Breast cancer Other 73       paternal grandfather's niece    ROS:  Pertinent items are noted in HPI.  Otherwise, a comprehensive ROS was negative.  Exam:   BP (!) 144/90   Pulse 90   Resp 16   Ht 5' 2.5" (1.588 m)   Wt 169 lb (76.7 kg)   LMP 06/09/2017 (Exact Date)   BMI 30.42 kg/m  Height: 5' 2.5" (158.8 cm) Ht Readings from Last 3 Encounters:  10/09/17 5' 2.5" (1.588 m)  10/07/17 5\' 2"  (1.575 m)  09/19/17 5\' 2"  (1.575 m)    General appearance: alert, cooperative and appears stated age Head: Normocephalic, without obvious abnormality, atraumatic Neck: no adenopathy, supple, symmetrical, trachea midline and thyroid normal to inspection and palpation Lungs: clear to auscultation bilaterally Breasts: normal appearance, no masses or tenderness, billateral mastectomy scars with some tenderness only, no masses noted Heart: regular rate and rhythm Abdomen: soft, non-tender; no masses,  no organomegaly Extremities: extremities normal, atraumatic, no cyanosis or edema Skin: Skin color, texture, turgor normal. No rashes or lesions Lymph nodes: Cervical, supraclavicular, and axillary nodes normal. No abnormal inguinal nodes palpated Neurologic: Grossly normal   Pelvic: External genitalia:  no lesions              Urethra:  normal appearing urethra with no masses, tenderness or lesions              Bartholin's and Skene's: normal                 Vagina: normal appearing  vagina with normal color and discharge, no lesions              Cervix: multiparous appearance, no cervical motion tenderness and no lesions              Pap taken: Yes.   Bimanual Exam:  Uterus:  normal size, contour, position, consistency, mobility, non-tender and anteverted              Adnexa: normal adnexa and no mass, fullness, tenderness               Rectovaginal: Confirms               Anus:  normal sphincter tone, no lesions  Chaperone present: yes  A:  Well Woman with normal exam  Contraception  BTL  Breast cancer on right undergoing chemotherapy and radiation at present with oncology management  Vitamin D deficiency on supplement  Type one diabetes good control with endocrine management  Colonoscopy due    P:   Reviewed health and wellness pertinent to exam  Aware if has vaginal bleeding while on medication to advise  Continue follow up with MD's as indicated  Discussed risks/benefits of colonoscopy, declines and will do IFOB.  IFOB dispensed with instructions  Pap smear: yes    counseled on breast self exam, feminine hygiene, adequate intake of calcium and vitamin D, diet and exercise  return annually or prn  An After Visit Summary was printed and given to the patient.

## 2017-10-08 NOTE — Telephone Encounter (Signed)
Per 7/17 no los

## 2017-10-09 ENCOUNTER — Other Ambulatory Visit (HOSPITAL_COMMUNITY)
Admission: RE | Admit: 2017-10-09 | Discharge: 2017-10-09 | Disposition: A | Discharge status: Home / Self Care | Payer: BLUE CROSS/BLUE SHIELD | Source: Ambulatory Visit | Attending: Certified Nurse Midwife | Admitting: Certified Nurse Midwife

## 2017-10-09 ENCOUNTER — Encounter: Payer: Self-pay | Admitting: Certified Nurse Midwife

## 2017-10-09 ENCOUNTER — Other Ambulatory Visit: Payer: Self-pay

## 2017-10-09 ENCOUNTER — Ambulatory Visit: Payer: BLUE CROSS/BLUE SHIELD | Admitting: Certified Nurse Midwife

## 2017-10-09 VITALS — BP 144/90 | HR 90 | Resp 16 | Ht 62.5 in | Wt 169.0 lb

## 2017-10-09 DIAGNOSIS — Z124 Encounter for screening for malignant neoplasm of cervix: Secondary | ICD-10-CM | POA: Diagnosis not present

## 2017-10-09 DIAGNOSIS — Z853 Personal history of malignant neoplasm of breast: Secondary | ICD-10-CM

## 2017-10-09 DIAGNOSIS — Z01419 Encounter for gynecological examination (general) (routine) without abnormal findings: Secondary | ICD-10-CM

## 2017-10-09 DIAGNOSIS — Z129 Encounter for screening for malignant neoplasm, site unspecified: Secondary | ICD-10-CM | POA: Diagnosis not present

## 2017-10-09 NOTE — Patient Instructions (Signed)

## 2017-10-13 ENCOUNTER — Ambulatory Visit: Payer: BLUE CROSS/BLUE SHIELD

## 2017-10-13 LAB — CYTOLOGY - PAP
DIAGNOSIS: NEGATIVE
HPV (WINDOPATH): NOT DETECTED

## 2017-10-14 ENCOUNTER — Inpatient Hospital Stay: Payer: BLUE CROSS/BLUE SHIELD

## 2017-10-14 VITALS — BP 129/76 | HR 96 | Temp 98.0°F | Wt 169.0 lb

## 2017-10-14 DIAGNOSIS — C50911 Malignant neoplasm of unspecified site of right female breast: Secondary | ICD-10-CM

## 2017-10-14 DIAGNOSIS — C50611 Malignant neoplasm of axillary tail of right female breast: Secondary | ICD-10-CM

## 2017-10-14 DIAGNOSIS — C773 Secondary and unspecified malignant neoplasm of axilla and upper limb lymph nodes: Principal | ICD-10-CM

## 2017-10-14 DIAGNOSIS — Z5111 Encounter for antineoplastic chemotherapy: Secondary | ICD-10-CM | POA: Diagnosis not present

## 2017-10-14 DIAGNOSIS — Z170 Estrogen receptor positive status [ER+]: Secondary | ICD-10-CM

## 2017-10-14 DIAGNOSIS — Z87891 Personal history of nicotine dependence: Secondary | ICD-10-CM | POA: Diagnosis not present

## 2017-10-14 LAB — CMP (CANCER CENTER ONLY)
ALK PHOS: 93 U/L (ref 38–126)
ALT: 63 U/L — AB (ref 0–44)
ANION GAP: 8 (ref 5–15)
AST: 40 U/L (ref 15–41)
Albumin: 3.7 g/dL (ref 3.5–5.0)
BUN: 12 mg/dL (ref 6–20)
CALCIUM: 9.4 mg/dL (ref 8.9–10.3)
CO2: 28 mmol/L (ref 22–32)
CREATININE: 0.78 mg/dL (ref 0.44–1.00)
Chloride: 106 mmol/L (ref 98–111)
GFR, Est AFR Am: >60 mL/min (ref >60)
Glucose, Bld: 196 mg/dL — ABNORMAL HIGH (ref 70–99)
Potassium: 3.9 mmol/L (ref 3.5–5.1)
Sodium: 142 mmol/L (ref 135–145)
TOTAL PROTEIN: 7.2 g/dL (ref 6.5–8.1)
Total Bilirubin: 0.5 mg/dL (ref 0.3–1.2)

## 2017-10-14 LAB — CBC WITH DIFFERENTIAL (CANCER CENTER ONLY)
BASOS ABS: 0 10*3/uL (ref 0.0–0.1)
BASOS PCT: 1 %
EOS ABS: 0.1 10*3/uL (ref 0.0–0.5)
Eosinophils Relative: 2 %
HCT: 31.1 % — ABNORMAL LOW (ref 34.8–46.6)
HEMOGLOBIN: 10.1 g/dL — AB (ref 11.6–15.9)
Lymphocytes Relative: 20 %
Lymphs Abs: 0.9 10*3/uL (ref 0.9–3.3)
MCH: 27.2 pg (ref 25.1–34.0)
MCHC: 32.5 g/dL (ref 31.5–36.0)
MCV: 83.6 fL (ref 79.5–101.0)
MONOS PCT: 8 %
Monocytes Absolute: 0.3 10*3/uL (ref 0.1–0.9)
NEUTROS ABS: 3 10*3/uL (ref 1.5–6.5)
NEUTROS PCT: 69 %
Platelet Count: 296 10*3/uL (ref 145–400)
RBC: 3.72 MIL/uL (ref 3.70–5.45)
RDW: 22.7 % — ABNORMAL HIGH (ref 11.2–14.5)
WBC: 4.4 10*3/uL (ref 3.9–10.3)

## 2017-10-14 MED ORDER — SODIUM CHLORIDE 0.9 % IV SOLN
Freq: Once | INTRAVENOUS | Status: AC
  Administered 2017-10-14: 10:00:00 via INTRAVENOUS

## 2017-10-14 MED ORDER — HEPARIN SOD (PORK) LOCK FLUSH 100 UNIT/ML IV SOLN
500.0000 [IU] | Freq: Once | INTRAVENOUS | Status: AC | PRN
  Administered 2017-10-14: 500 [IU]
  Filled 2017-10-14: qty 5

## 2017-10-14 MED ORDER — FAMOTIDINE IN NACL 20-0.9 MG/50ML-% IV SOLN
INTRAVENOUS | Status: AC
  Filled 2017-10-14: qty 50

## 2017-10-14 MED ORDER — SODIUM CHLORIDE 0.9% FLUSH
10.0000 mL | INTRAVENOUS | Status: DC | PRN
  Administered 2017-10-14: 10 mL
  Filled 2017-10-14: qty 10

## 2017-10-14 MED ORDER — PACLITAXEL CHEMO INJECTION 300 MG/50ML
80.0000 mg/m2 | Freq: Once | INTRAVENOUS | Status: AC
  Administered 2017-10-14: 144 mg via INTRAVENOUS
  Filled 2017-10-14: qty 24

## 2017-10-14 MED ORDER — FAMOTIDINE IN NACL 20-0.9 MG/50ML-% IV SOLN
40.0000 mg | Freq: Once | INTRAVENOUS | Status: AC
  Administered 2017-10-14: 40 mg via INTRAVENOUS

## 2017-10-14 MED ORDER — DIPHENHYDRAMINE HCL 25 MG PO CAPS
25.0000 mg | ORAL_CAPSULE | Freq: Once | ORAL | Status: AC
  Administered 2017-10-14: 25 mg via ORAL

## 2017-10-14 MED ORDER — DIPHENHYDRAMINE HCL 25 MG PO CAPS
ORAL_CAPSULE | ORAL | Status: AC
  Filled 2017-10-14: qty 1

## 2017-10-14 NOTE — Patient Instructions (Signed)
[facility] Discharge Instructions for Patients Receiving Chemotherapy  Today you received the following chemotherapy agents taxol  To help prevent nausea and vomiting after your treatment, we encourage you to take your nausea medication as directed   If you develop nausea and vomiting that is not controlled by your nausea medication, call the clinic.   BELOW ARE SYMPTOMS THAT SHOULD BE REPORTED IMMEDIATELY:  *FEVER GREATER THAN 100.5 F  *CHILLS WITH OR WITHOUT FEVER  NAUSEA AND VOMITING THAT IS NOT CONTROLLED WITH YOUR NAUSEA MEDICATION  *UNUSUAL SHORTNESS OF BREATH  *UNUSUAL BRUISING OR BLEEDING  TENDERNESS IN MOUTH AND THROAT WITH OR WITHOUT PRESENCE OF ULCERS  *URINARY PROBLEMS  *BOWEL PROBLEMS  UNUSUAL RASH Items with * indicate a potential emergency and should be followed up as soon as possible.  Feel free to call the clinic you have any questions or concerns. The clinic phone number is [phone].  

## 2017-10-15 ENCOUNTER — Ambulatory Visit (HOSPITAL_COMMUNITY): Admission: RE | Admit: 2017-10-15 | Payer: BLUE CROSS/BLUE SHIELD | Source: Ambulatory Visit

## 2017-10-15 ENCOUNTER — Telehealth: Payer: Self-pay | Admitting: *Deleted

## 2017-10-15 DIAGNOSIS — E109 Type 1 diabetes mellitus without complications: Secondary | ICD-10-CM | POA: Diagnosis not present

## 2017-10-15 DIAGNOSIS — Z794 Long term (current) use of insulin: Secondary | ICD-10-CM | POA: Diagnosis not present

## 2017-10-15 NOTE — Telephone Encounter (Signed)
[id number] Upbeat Study: Patient was scheduled to have Cardiac MRI this morning and then meet with research nurse for physical functions and neurocognitive testing as part of her 3 month Upbeat study activities.  Research nurse was notified by MRI department that patient called this morning to cancel her MRI and did not reschedule it.  Reason was not known.  I called patient at aprox 10 am and left her a VM to see if she is ok and asked her to call research nurse when she is able to reschedule study activities. Informed patient that these assessments can be rescheduled in the next 30 days and still be within the study window for this visit.  Gave patient my phone number to return call when she is able.   Foye Spurling, BSN, RN Clinical Research Nurse 10/15/2017 10:00 AM

## 2017-10-16 NOTE — Telephone Encounter (Signed)
Routing to provider for review. Okay to close encounter or is further follow up needed? °

## 2017-10-21 ENCOUNTER — Telehealth: Payer: Self-pay | Admitting: *Deleted

## 2017-10-21 ENCOUNTER — Inpatient Hospital Stay: Payer: BLUE CROSS/BLUE SHIELD

## 2017-10-21 ENCOUNTER — Ambulatory Visit (HOSPITAL_COMMUNITY)
Admission: RE | Admit: 2017-10-21 | Discharge: 2017-10-21 | Disposition: A | Discharge status: Home / Self Care | Payer: BLUE CROSS/BLUE SHIELD | Source: Ambulatory Visit | Attending: Hematology and Oncology | Admitting: Hematology and Oncology

## 2017-10-21 ENCOUNTER — Inpatient Hospital Stay (HOSPITAL_BASED_OUTPATIENT_CLINIC_OR_DEPARTMENT_OTHER): Payer: BLUE CROSS/BLUE SHIELD | Admitting: Hematology and Oncology

## 2017-10-21 DIAGNOSIS — C773 Secondary and unspecified malignant neoplasm of axilla and upper limb lymph nodes: Secondary | ICD-10-CM | POA: Diagnosis not present

## 2017-10-21 DIAGNOSIS — C50911 Malignant neoplasm of unspecified site of right female breast: Secondary | ICD-10-CM | POA: Diagnosis not present

## 2017-10-21 DIAGNOSIS — Z452 Encounter for adjustment and management of vascular access device: Secondary | ICD-10-CM | POA: Diagnosis not present

## 2017-10-21 DIAGNOSIS — Z170 Estrogen receptor positive status [ER+]: Secondary | ICD-10-CM

## 2017-10-21 DIAGNOSIS — C50611 Malignant neoplasm of axillary tail of right female breast: Secondary | ICD-10-CM | POA: Diagnosis not present

## 2017-10-21 DIAGNOSIS — Z87891 Personal history of nicotine dependence: Secondary | ICD-10-CM | POA: Diagnosis not present

## 2017-10-21 DIAGNOSIS — Z5111 Encounter for antineoplastic chemotherapy: Secondary | ICD-10-CM | POA: Diagnosis not present

## 2017-10-21 LAB — CMP (CANCER CENTER ONLY)
ALBUMIN: 3.7 g/dL (ref 3.5–5.0)
ALK PHOS: 92 U/L (ref 38–126)
ALT: 63 U/L — ABNORMAL HIGH (ref 0–44)
ANION GAP: 8 (ref 5–15)
AST: 48 U/L — ABNORMAL HIGH (ref 15–41)
BUN: 12 mg/dL (ref 6–20)
CALCIUM: 9.3 mg/dL (ref 8.9–10.3)
CO2: 28 mmol/L (ref 22–32)
Chloride: 107 mmol/L (ref 98–111)
Creatinine: 0.79 mg/dL (ref 0.44–1.00)
GFR, Estimated: >60 mL/min (ref >60)
GLUCOSE: 178 mg/dL — AB (ref 70–99)
POTASSIUM: 3.9 mmol/L (ref 3.5–5.1)
SODIUM: 143 mmol/L (ref 135–145)
Total Bilirubin: 0.4 mg/dL (ref 0.3–1.2)
Total Protein: 7.1 g/dL (ref 6.5–8.1)

## 2017-10-21 LAB — CBC WITH DIFFERENTIAL (CANCER CENTER ONLY)
Basophils Absolute: 0.1 10*3/uL (ref 0.0–0.1)
Basophils Relative: 2 %
EOS ABS: 0.1 10*3/uL (ref 0.0–0.5)
EOS PCT: 2 %
HCT: 30.1 % — ABNORMAL LOW (ref 34.8–46.6)
Hemoglobin: 9.9 g/dL — ABNORMAL LOW (ref 11.6–15.9)
Lymphocytes Relative: 25 %
Lymphs Abs: 0.9 10*3/uL (ref 0.9–3.3)
MCH: 27.8 pg (ref 25.1–34.0)
MCHC: 32.9 g/dL (ref 31.5–36.0)
MCV: 84.4 fL (ref 79.5–101.0)
Monocytes Absolute: 0.2 10*3/uL (ref 0.1–0.9)
Monocytes Relative: 7 %
Neutro Abs: 2.2 10*3/uL (ref 1.5–6.5)
Neutrophils Relative %: 64 %
Platelet Count: 313 10*3/uL (ref 145–400)
RBC: 3.57 MIL/uL — AB (ref 3.70–5.45)
RDW: 25.4 % — ABNORMAL HIGH (ref 11.2–14.5)
WBC: 3.4 10*3/uL — AB (ref 3.9–10.3)

## 2017-10-21 MED ORDER — SODIUM CHLORIDE 0.9% FLUSH
10.0000 mL | INTRAVENOUS | Status: DC | PRN
  Administered 2017-10-21: 10 mL
  Filled 2017-10-21: qty 10

## 2017-10-21 MED ORDER — FAMOTIDINE IN NACL 20-0.9 MG/50ML-% IV SOLN
40.0000 mg | Freq: Once | INTRAVENOUS | Status: AC
Start: 2017-10-21 — End: 2017-10-21
  Administered 2017-10-21: 40 mg via INTRAVENOUS

## 2017-10-21 MED ORDER — SODIUM CHLORIDE 0.9 % IV SOLN
Freq: Once | INTRAVENOUS | Status: AC
  Administered 2017-10-21: 11:00:00 via INTRAVENOUS
  Filled 2017-10-21: qty 250

## 2017-10-21 MED ORDER — DIPHENHYDRAMINE HCL 25 MG PO CAPS
ORAL_CAPSULE | ORAL | Status: AC
  Filled 2017-10-21: qty 1

## 2017-10-21 MED ORDER — FAMOTIDINE IN NACL 20-0.9 MG/50ML-% IV SOLN
INTRAVENOUS | Status: AC
  Filled 2017-10-21: qty 50

## 2017-10-21 MED ORDER — SODIUM CHLORIDE 0.9 % IV SOLN
80.0000 mg/m2 | Freq: Once | INTRAVENOUS | Status: AC
  Administered 2017-10-21: 144 mg via INTRAVENOUS
  Filled 2017-10-21: qty 24

## 2017-10-21 MED ORDER — HEPARIN SOD (PORK) LOCK FLUSH 100 UNIT/ML IV SOLN
500.0000 [IU] | Freq: Once | INTRAVENOUS | Status: AC | PRN
  Administered 2017-10-21: 500 [IU]
  Filled 2017-10-21: qty 5

## 2017-10-21 MED ORDER — DIPHENHYDRAMINE HCL 25 MG PO CAPS
25.0000 mg | ORAL_CAPSULE | Freq: Once | ORAL | Status: AC
  Administered 2017-10-21: 25 mg via ORAL

## 2017-10-21 NOTE — Patient Instructions (Signed)
[facility] Discharge Instructions for Patients Receiving Chemotherapy  Today you received the following chemotherapy agents :  Taxol.  To help prevent nausea and vomiting after your treatment, we encourage you to take your nausea medication as prescribed.   If you develop nausea and vomiting that is not controlled by your nausea medication, call the clinic.   BELOW ARE SYMPTOMS THAT SHOULD BE REPORTED IMMEDIATELY:  *FEVER GREATER THAN 100.5 F  *CHILLS WITH OR WITHOUT FEVER  NAUSEA AND VOMITING THAT IS NOT CONTROLLED WITH YOUR NAUSEA MEDICATION  *UNUSUAL SHORTNESS OF BREATH  *UNUSUAL BRUISING OR BLEEDING  TENDERNESS IN MOUTH AND THROAT WITH OR WITHOUT PRESENCE OF ULCERS  *URINARY PROBLEMS  *BOWEL PROBLEMS  UNUSUAL RASH Items with * indicate a potential emergency and should be followed up as soon as possible.  Feel free to call the clinic should you have any questions or concerns. The clinic phone number is [phone].  Please show the CHEMO ALERT CARD at check-in to the Emergency Department and triage nurse.   

## 2017-10-21 NOTE — Telephone Encounter (Signed)
[id number] VHQION STUDY; Spoke with patient today in infusion room to reschedule study activities for her 3 month visit.  Patient states she had to cancel the Cardiac MRI last week due to her father's unexpected heart surgery.  He is doing ok now and she is willing to reschedule study visit to be completed by 11/19/17 to be within visit window. Called MRI and they rescheduled patient for Cardiac MRI on 8/21 at 12 noon.  Called patient with date and time and arranged to have patient come over to [facility] after her MRI for physical functions and neurocognitive testing.  Patient agreed with this schedule.  Encouraged patient to call research nurse if any problems or concerns prior to her 3 month study visit.  Thanked her for her time today and ongoing participation in this clinical trial. Foye Spurling, BSN, RN Clinical Research Nurse 10/21/2017 2:27 PM

## 2017-10-21 NOTE — Assessment & Plan Note (Signed)
06/05/2017:Bilateral mastectomies: Right mastectomy: IDC grade 1, 3 foci 1.2 cm, 1.2 cm, 1 cm, IgA DCIS, LVH diffuse present, margins negative, 4/7 lymph nodes positive with extracapsular extension, ER 95%, PR 95%, HER-2 negative, Ki-67 2% to 10%, T1 cN2 aM0 stage II a pathological stage; left mastectomy: Benign  Mammaprint done preoperatively: Low risk Axillary lymph node dissection 06/26/2017: 2/22 lymph nodes positive(making a total of 6 lymph nodes that were positive)  Treatment plan: 1.Because ofher high risk features (6 LN Pos),werecommended adjuvant systemic chemotherapywithdose dense Adriamycin and Cytoxan x4 followed by Taxol weekly x12 started 07/22/2017 3.Followed by radiation.  4.Followed by adjuvant antiestrogen therapy  UPBEAT clinical trial ([id number]):No toxicities related to the trial --------------------------------------------------------------------------- Current treatment: Completed 4 cycles ofdose dense Adriamycin and Cytoxan, today cycle 5 Taxol Labs reviewed Echocardiogram 06/20/2017: EF 55 to 60%  Taxol toxicities: Denies any neuropathy.  Complains of moderate fatigue.  Monitoring closely for chemo toxicities. Return to clinic weekly for chemotherapy and every other week for follow-up with me 

## 2017-10-21 NOTE — Progress Notes (Signed)
Patient Care Team: Tisovec, Fransico Him, MD as PCP - General (Internal Medicine)  DIAGNOSIS:  Encounter Diagnosis  Name Primary?  . Malignant neoplasm of axillary tail of right breast in female, estrogen receptor positive ([place])     SUMMARY OF ONCOLOGIC HISTORY:   Malignant neoplasm of axillary tail of right breast in female, estrogen receptor positive ([place])   05/09/2017 Initial Diagnosis    May 2018: Palpable right axillary mass.  The recommended a 6-week follow-up but apparently the mass decreased in size.  Recent increase in the mass was noted, biopsy revealed Grade 1 IDC with DCIS, lymphovascular invasion present, 4 small hypoechoic masses largest 0.9 cm, T1BN1 stage Ib clinical stage      06/05/2017 Surgery    Bilateral mastectomies: Right mastectomy: IDC grade 1, 3 foci 1.2 cm, 1.2 cm, 1 cm, IgA DCIS, LVH diffuse present, margins negative, 4/7 lymph nodes positive with extracapsular extension, ER 95%, PR 95%, HER-2 negative, Ki-67 2% to 10%, T1 cN2 aM0 stage II a pathological stage; left mastectomy: Benign      06/26/2017 Surgery    Axillary lymph node dissection: 2/22 lymph nodes positive (making a total of 6+ lymph nodes)      07/22/2017 -  Chemotherapy    Adjuvant chemotherapy with dose dense Adriamycin and Cytoxan x4 followed by Taxol weekly x12        Breast cancer metastasized to axillary lymph node, right ([place])   06/05/2017 Initial Diagnosis    Breast cancer metastasized to axillary lymph node, right ([place])      07/22/2017 -  Chemotherapy    Adjuvant chemotherapy with dose dense Adriamycin and Cytoxan x4 followed by Taxol weekly x12        CHIEF COMPLIANT: Cycle 5 Taxol  INTERVAL HISTORY: KENNADIE BRENNER is a 42 year old with above-mentioned history of right breast cancer who underwent bilateral mastectomies and is now on adjuvant chemotherapy and today is cycle 5 of Taxol.  She has plenty of energy.  She denies any nausea vomiting.  She has some bruising of the nails.   Denies any neuropathy.  She went flyfishing recently and had a good time.  REVIEW OF SYSTEMS:   Constitutional: Denies fevers, chills or abnormal weight loss Eyes: Denies blurriness of vision Ears, nose, mouth, throat, and face: Denies mucositis or sore throat Respiratory: Denies cough, dyspnea or wheezes Cardiovascular: Denies palpitation, chest discomfort Gastrointestinal:  Denies nausea, heartburn or change in bowel habits Skin: Denies abnormal skin rashes Lymphatics: Denies new lymphadenopathy or easy bruising Neurological:Denies numbness, tingling or new weaknesses Behavioral/Psych: Mood is stable, no new changes  Extremities: No lower extremity edema  All other systems were reviewed with the patient and are negative.  I have reviewed the past medical history, past surgical history, social history and family history with the patient and they are unchanged from previous note.  ALLERGIES:  is allergic to dilaudid [hydromorphone hcl].  MEDICATIONS:  Current Outpatient Medications  Medication Sig Dispense Refill  . acetaminophen (TYLENOL) 500 MG tablet Take 1,000 mg by mouth every 6 (six) hours as needed for moderate pain or headache.    . insulin aspart (NOVOLOG) 100 UNIT/ML injection Inject 45 Units into the skin See admin instructions. Use via insulin pump up to 45 units daily    . lidocaine-prilocaine (EMLA) cream Apply to affected area once 30 g 3  . LORazepam (ATIVAN) 0.5 MG tablet Take 1 tablet (0.5 mg total) by mouth every 6 (six) hours as needed for anxiety or sleep.  30 tablet 3  . ondansetron (ZOFRAN) 8 MG tablet Take 1 tablet (8 mg total) by mouth 2 (two) times daily as needed. Start on the third day after chemotherapy. (Patient not taking: Reported on 10/09/2017) 30 tablet 1  . Probiotic Product (PROBIOTIC DAILY PO) Take 1 capsule by mouth at bedtime.     Marland Kitchen venlafaxine XR (EFFEXOR-XR) 37.5 MG 24 hr capsule TAKE 1 CAPSULE BY MOUTH EVERY DAY WITH BREAKFAST 30 capsule 3   No  current facility-administered medications for this visit.     PHYSICAL EXAMINATION: ECOG PERFORMANCE STATUS: 1 - Symptomatic but completely ambulatory  Vitals:   10/21/17 0942  BP: (!) 152/84  Pulse: 83  Resp: 18  Temp: 98.6 F (37 C)  SpO2: 100%   Filed Weights   10/21/17 0942  Weight: 168 lb 9.6 oz (76.5 kg)    GENERAL:alert, no distress and comfortable SKIN: skin color, texture, turgor are normal, no rashes or significant lesions EYES: normal, Conjunctiva are pink and non-injected, sclera clear OROPHARYNX:no exudate, no erythema and lips, buccal mucosa, and tongue normal  NECK: supple, thyroid normal size, non-tender, without nodularity LYMPH:  no palpable lymphadenopathy in the cervical, axillary or inguinal LUNGS: clear to auscultation and percussion with normal breathing effort HEART: regular rate & rhythm and no murmurs and no lower extremity edema ABDOMEN:abdomen soft, non-tender and normal bowel sounds MUSCULOSKELETAL:no cyanosis of digits and no clubbing  NEURO: alert & oriented x 3 with fluent speech, no focal motor/sensory deficits EXTREMITIES: No lower extremity edema   LABORATORY DATA:  I have reviewed the data as listed CMP Latest Ref Rng & Units 10/14/2017 10/07/2017 09/30/2017  Glucose 70 - 99 mg/dL 196(H) 162(H) 122(H)  BUN 6 - 20 mg/dL '12 9 9  ' Creatinine 0.44 - 1.00 mg/dL 0.78 0.78 0.75  Sodium 135 - 145 mmol/L 142 140 142  Potassium 3.5 - 5.1 mmol/L 3.9 4.0 4.1  Chloride 98 - 111 mmol/L 106 104 105  CO2 22 - 32 mmol/L '28 29 30  ' Calcium 8.9 - 10.3 mg/dL 9.4 9.4 9.4  Total Protein 6.5 - 8.1 g/dL 7.2 7.4 6.9  Total Bilirubin 0.3 - 1.2 mg/dL 0.5 0.4 0.3  Alkaline Phos 38 - 126 U/L 93 91 88  AST 15 - 41 U/L 40 57(H) 40  ALT 0 - 44 U/L 63(H) 100(H) 66(H)    Lab Results  Component Value Date   WBC 3.4 (L) 10/21/2017   HGB 9.9 (L) 10/21/2017   HCT 30.1 (L) 10/21/2017   MCV 84.4 10/21/2017   PLT 313 10/21/2017   NEUTROABS 2.2 10/21/2017     ASSESSMENT & PLAN:  Malignant neoplasm of axillary tail of right breast in female, estrogen receptor positive ([place]) 06/05/2017:Bilateral mastectomies: Right mastectomy: IDC grade 1, 3 foci 1.2 cm, 1.2 cm, 1 cm, IgA DCIS, LVH diffuse present, margins negative, 4/7 lymph nodes positive with extracapsular extension, ER 95%, PR 95%, HER-2 negative, Ki-67 2% to 10%, T1 cN2 aM0 stage II a pathological stage; left mastectomy: Benign  Mammaprint done preoperatively: Low risk Axillary lymph node dissection 06/26/2017: 2/22 lymph nodes positive(making a total of 6 lymph nodes that were positive)  Treatment plan: 1.Because ofher high risk features (6 LN Pos),werecommended adjuvant systemic chemotherapywithdose dense Adriamycin and Cytoxan x4 followed by Taxol weekly x12 started 07/22/2017 3.Followed by radiation.  4.Followed by adjuvant antiestrogen therapy  UPBEAT clinical trial ([id number]):No toxicities related to the trial --------------------------------------------------------------------------- Current treatment: Completed 4 cycles ofdose dense Adriamycin and Cytoxan, today cycle 5  Taxol Labs reviewed Echocardiogram 06/20/2017: EF 55 to 60%  Taxol toxicities: Denies any neuropathy. Slight bruising of the fingernails  Monitoring closely for chemo toxicities. Return to clinic weekly for chemotherapy and every other week for follow-up with me    No orders of the defined types were placed in this encounter.  The patient has a good understanding of the overall plan. she agrees with it. she will call with any problems that may develop before the next visit here.   Harriette Ohara, MD 10/21/17

## 2017-10-28 ENCOUNTER — Inpatient Hospital Stay: Payer: BLUE CROSS/BLUE SHIELD | Attending: Hematology and Oncology

## 2017-10-28 ENCOUNTER — Inpatient Hospital Stay: Payer: BLUE CROSS/BLUE SHIELD

## 2017-10-28 VITALS — BP 146/87 | HR 91 | Temp 98.7°F | Resp 18

## 2017-10-28 DIAGNOSIS — F329 Major depressive disorder, single episode, unspecified: Secondary | ICD-10-CM | POA: Insufficient documentation

## 2017-10-28 DIAGNOSIS — G620 Drug-induced polyneuropathy: Secondary | ICD-10-CM | POA: Insufficient documentation

## 2017-10-28 DIAGNOSIS — C50611 Malignant neoplasm of axillary tail of right female breast: Secondary | ICD-10-CM | POA: Insufficient documentation

## 2017-10-28 DIAGNOSIS — Z170 Estrogen receptor positive status [ER+]: Secondary | ICD-10-CM | POA: Insufficient documentation

## 2017-10-28 DIAGNOSIS — C50911 Malignant neoplasm of unspecified site of right female breast: Secondary | ICD-10-CM

## 2017-10-28 DIAGNOSIS — C773 Secondary and unspecified malignant neoplasm of axilla and upper limb lymph nodes: Principal | ICD-10-CM

## 2017-10-28 DIAGNOSIS — Z9013 Acquired absence of bilateral breasts and nipples: Secondary | ICD-10-CM | POA: Insufficient documentation

## 2017-10-28 DIAGNOSIS — Z5111 Encounter for antineoplastic chemotherapy: Secondary | ICD-10-CM | POA: Diagnosis not present

## 2017-10-28 DIAGNOSIS — R5383 Other fatigue: Secondary | ICD-10-CM | POA: Diagnosis not present

## 2017-10-28 LAB — CMP (CANCER CENTER ONLY)
ALBUMIN: 3.7 g/dL (ref 3.5–5.0)
ALK PHOS: 89 U/L (ref 38–126)
ALT: 53 U/L — AB (ref 0–44)
ANION GAP: 10 (ref 5–15)
AST: 45 U/L — ABNORMAL HIGH (ref 15–41)
BUN: 11 mg/dL (ref 6–20)
CALCIUM: 9.1 mg/dL (ref 8.9–10.3)
CO2: 27 mmol/L (ref 22–32)
CREATININE: 0.88 mg/dL (ref 0.44–1.00)
Chloride: 104 mmol/L (ref 98–111)
GFR, Est AFR Am: >60 mL/min (ref >60)
GFR, Estimated: >60 mL/min (ref >60)
GLUCOSE: 230 mg/dL — AB (ref 70–99)
Potassium: 4 mmol/L (ref 3.5–5.1)
SODIUM: 141 mmol/L (ref 135–145)
Total Bilirubin: 0.6 mg/dL (ref 0.3–1.2)
Total Protein: 7.1 g/dL (ref 6.5–8.1)

## 2017-10-28 LAB — CBC WITH DIFFERENTIAL (CANCER CENTER ONLY)
BASOS ABS: 0.1 10*3/uL (ref 0.0–0.1)
BASOS PCT: 2 %
EOS PCT: 1 %
Eosinophils Absolute: 0 10*3/uL (ref 0.0–0.5)
HEMATOCRIT: 32 % — AB (ref 34.8–46.6)
Hemoglobin: 10.1 g/dL — ABNORMAL LOW (ref 11.6–15.9)
Lymphocytes Relative: 34 %
Lymphs Abs: 0.9 10*3/uL (ref 0.9–3.3)
MCH: 27.3 pg (ref 25.1–34.0)
MCHC: 31.6 g/dL (ref 31.5–36.0)
MCV: 86.5 fL (ref 79.5–101.0)
MONO ABS: 0.2 10*3/uL (ref 0.1–0.9)
MONOS PCT: 8 %
NEUTROS ABS: 1.5 10*3/uL (ref 1.5–6.5)
Neutrophils Relative %: 55 %
PLATELETS: 341 10*3/uL (ref 145–400)
RBC: 3.7 MIL/uL (ref 3.70–5.45)
RDW: 21.9 % — AB (ref 11.2–14.5)
WBC Count: 2.8 10*3/uL — ABNORMAL LOW (ref 3.9–10.3)

## 2017-10-28 MED ORDER — SODIUM CHLORIDE 0.9 % IV SOLN
Freq: Once | INTRAVENOUS | Status: AC
  Administered 2017-10-28: 09:00:00 via INTRAVENOUS
  Filled 2017-10-28: qty 250

## 2017-10-28 MED ORDER — PACLITAXEL CHEMO INJECTION 300 MG/50ML: 80.0000 mg/m2 | Freq: Once | INTRAVENOUS | Status: DC

## 2017-10-28 MED ORDER — HEPARIN SOD (PORK) LOCK FLUSH 100 UNIT/ML IV SOLN
500.0000 [IU] | Freq: Once | INTRAVENOUS | Status: AC | PRN
  Administered 2017-10-28: 500 [IU]
  Filled 2017-10-28: qty 5

## 2017-10-28 MED ORDER — SODIUM CHLORIDE 0.9 % IV SOLN
80.0000 mg/m2 | Freq: Once | INTRAVENOUS | Status: AC
  Administered 2017-10-28: 144 mg via INTRAVENOUS
  Filled 2017-10-28: qty 24

## 2017-10-28 MED ORDER — FAMOTIDINE IN NACL 20-0.9 MG/50ML-% IV SOLN
40.0000 mg | Freq: Once | INTRAVENOUS | Status: AC
  Administered 2017-10-28: 40 mg via INTRAVENOUS

## 2017-10-28 MED ORDER — SODIUM CHLORIDE 0.9% FLUSH
10.0000 mL | INTRAVENOUS | Status: DC | PRN
  Administered 2017-10-28: 10 mL
  Filled 2017-10-28: qty 10

## 2017-10-28 MED ORDER — DIPHENHYDRAMINE HCL 25 MG PO CAPS
25.0000 mg | ORAL_CAPSULE | Freq: Once | ORAL | Status: AC
  Administered 2017-10-28: 25 mg via ORAL

## 2017-10-28 MED ORDER — SODIUM CHLORIDE 0.9% FLUSH
10.0000 mL | INTRAVENOUS | Status: DC | PRN
Start: 2017-10-28 — End: 2017-10-28
  Administered 2017-10-28: 10 mL
  Filled 2017-10-28: qty 10

## 2017-10-28 MED ORDER — DIPHENHYDRAMINE HCL 25 MG PO CAPS
ORAL_CAPSULE | ORAL | Status: AC
  Filled 2017-10-28: qty 1

## 2017-10-28 MED ORDER — FAMOTIDINE IN NACL 20-0.9 MG/50ML-% IV SOLN
INTRAVENOUS | Status: AC
  Filled 2017-10-28: qty 100

## 2017-10-28 NOTE — Patient Instructions (Signed)
[facility] Discharge Instructions for Patients Receiving Chemotherapy  Today you received the following chemotherapy agents:  Taxol.  To help prevent nausea and vomiting after your treatment, we encourage you to take your nausea medication as directed.   If you develop nausea and vomiting that is not controlled by your nausea medication, call the clinic.   BELOW ARE SYMPTOMS THAT SHOULD BE REPORTED IMMEDIATELY:  *FEVER GREATER THAN 100.5 F  *CHILLS WITH OR WITHOUT FEVER  NAUSEA AND VOMITING THAT IS NOT CONTROLLED WITH YOUR NAUSEA MEDICATION  *UNUSUAL SHORTNESS OF BREATH  *UNUSUAL BRUISING OR BLEEDING  TENDERNESS IN MOUTH AND THROAT WITH OR WITHOUT PRESENCE OF ULCERS  *URINARY PROBLEMS  *BOWEL PROBLEMS  UNUSUAL RASH Items with * indicate a potential emergency and should be followed up as soon as possible.  Feel free to call the clinic should you have any questions or concerns. The clinic phone number is [phone].  Please show the CHEMO ALERT CARD at check-in to the Emergency Department and triage nurse.   

## 2017-11-04 ENCOUNTER — Inpatient Hospital Stay: Payer: BLUE CROSS/BLUE SHIELD

## 2017-11-04 ENCOUNTER — Inpatient Hospital Stay: Payer: BLUE CROSS/BLUE SHIELD | Admitting: *Deleted

## 2017-11-04 ENCOUNTER — Inpatient Hospital Stay (HOSPITAL_BASED_OUTPATIENT_CLINIC_OR_DEPARTMENT_OTHER): Payer: BLUE CROSS/BLUE SHIELD | Admitting: Hematology and Oncology

## 2017-11-04 ENCOUNTER — Encounter: Payer: Self-pay | Admitting: *Deleted

## 2017-11-04 DIAGNOSIS — Z170 Estrogen receptor positive status [ER+]: Secondary | ICD-10-CM

## 2017-11-04 DIAGNOSIS — C50611 Malignant neoplasm of axillary tail of right female breast: Secondary | ICD-10-CM

## 2017-11-04 DIAGNOSIS — C773 Secondary and unspecified malignant neoplasm of axilla and upper limb lymph nodes: Secondary | ICD-10-CM | POA: Diagnosis not present

## 2017-11-04 DIAGNOSIS — C50911 Malignant neoplasm of unspecified site of right female breast: Secondary | ICD-10-CM

## 2017-11-04 DIAGNOSIS — Z9013 Acquired absence of bilateral breasts and nipples: Secondary | ICD-10-CM | POA: Diagnosis not present

## 2017-11-04 DIAGNOSIS — F329 Major depressive disorder, single episode, unspecified: Secondary | ICD-10-CM | POA: Diagnosis not present

## 2017-11-04 DIAGNOSIS — Z5111 Encounter for antineoplastic chemotherapy: Secondary | ICD-10-CM | POA: Diagnosis not present

## 2017-11-04 DIAGNOSIS — G620 Drug-induced polyneuropathy: Secondary | ICD-10-CM | POA: Diagnosis not present

## 2017-11-04 DIAGNOSIS — R5383 Other fatigue: Secondary | ICD-10-CM | POA: Diagnosis not present

## 2017-11-04 LAB — CMP (CANCER CENTER ONLY)
ALK PHOS: 83 U/L (ref 38–126)
ALT: 44 U/L (ref 0–44)
ANION GAP: 11 (ref 5–15)
AST: 32 U/L (ref 15–41)
Albumin: 3.8 g/dL (ref 3.5–5.0)
BILIRUBIN TOTAL: 0.6 mg/dL (ref 0.3–1.2)
BUN: 8 mg/dL (ref 6–20)
CO2: 26 mmol/L (ref 22–32)
Calcium: 9 mg/dL (ref 8.9–10.3)
Chloride: 105 mmol/L (ref 98–111)
Creatinine: 0.86 mg/dL (ref 0.44–1.00)
GFR, Estimated: >60 mL/min (ref >60)
Glucose, Bld: 176 mg/dL — ABNORMAL HIGH (ref 70–99)
Potassium: 4.1 mmol/L (ref 3.5–5.1)
SODIUM: 142 mmol/L (ref 135–145)
TOTAL PROTEIN: 7.2 g/dL (ref 6.5–8.1)

## 2017-11-04 LAB — CBC WITH DIFFERENTIAL (CANCER CENTER ONLY)
BASOS ABS: 0 10*3/uL (ref 0.0–0.1)
BASOS PCT: 1 %
Eosinophils Absolute: 0 10*3/uL (ref 0.0–0.5)
Eosinophils Relative: 1 %
HEMATOCRIT: 30.6 % — AB (ref 34.8–46.6)
HEMOGLOBIN: 10.2 g/dL — AB (ref 11.6–15.9)
Lymphocytes Relative: 26 %
Lymphs Abs: 0.7 10*3/uL — ABNORMAL LOW (ref 0.9–3.3)
MCH: 29.2 pg (ref 25.1–34.0)
MCHC: 33.4 g/dL (ref 31.5–36.0)
MCV: 87.4 fL (ref 79.5–101.0)
Monocytes Absolute: 0.3 10*3/uL (ref 0.1–0.9)
Monocytes Relative: 9 %
NEUTROS ABS: 1.7 10*3/uL (ref 1.5–6.5)
NEUTROS PCT: 63 %
Platelet Count: 363 10*3/uL (ref 145–400)
RBC: 3.5 MIL/uL — AB (ref 3.70–5.45)
RDW: 24.1 % — ABNORMAL HIGH (ref 11.2–14.5)
WBC Count: 2.7 10*3/uL — ABNORMAL LOW (ref 3.9–10.3)

## 2017-11-04 MED ORDER — FAMOTIDINE IN NACL 20-0.9 MG/50ML-% IV SOLN
INTRAVENOUS | Status: AC
  Filled 2017-11-04: qty 50

## 2017-11-04 MED ORDER — DIPHENHYDRAMINE HCL 25 MG PO CAPS
25.0000 mg | ORAL_CAPSULE | Freq: Once | ORAL | Status: AC
  Administered 2017-11-04: 25 mg via ORAL

## 2017-11-04 MED ORDER — PACLITAXEL CHEMO INJECTION 300 MG/50ML
80.0000 mg/m2 | Freq: Once | INTRAVENOUS | Status: AC
  Administered 2017-11-04: 144 mg via INTRAVENOUS
  Filled 2017-11-04: qty 24

## 2017-11-04 MED ORDER — FAMOTIDINE IN NACL 20-0.9 MG/50ML-% IV SOLN
40.0000 mg | Freq: Once | INTRAVENOUS | Status: AC
  Administered 2017-11-04: 40 mg via INTRAVENOUS

## 2017-11-04 MED ORDER — SODIUM CHLORIDE 0.9% FLUSH
10.0000 mL | INTRAVENOUS | Status: DC | PRN
  Filled 2017-11-04: qty 10

## 2017-11-04 MED ORDER — SODIUM CHLORIDE 0.9% FLUSH
10.0000 mL | INTRAVENOUS | Status: DC | PRN
  Administered 2017-11-04 (×2): 10 mL
  Filled 2017-11-04: qty 10

## 2017-11-04 MED ORDER — SODIUM CHLORIDE 0.9 % IV SOLN
Freq: Once | INTRAVENOUS | Status: AC
  Administered 2017-11-04: 10:00:00 via INTRAVENOUS
  Filled 2017-11-04: qty 250

## 2017-11-04 MED ORDER — HEPARIN SOD (PORK) LOCK FLUSH 100 UNIT/ML IV SOLN
500.0000 [IU] | Freq: Once | INTRAVENOUS | Status: AC | PRN
  Administered 2017-11-04: 500 [IU]
  Filled 2017-11-04: qty 5

## 2017-11-04 MED ORDER — DIPHENHYDRAMINE HCL 25 MG PO CAPS
ORAL_CAPSULE | ORAL | Status: AC
  Filled 2017-11-04: qty 1

## 2017-11-04 NOTE — Progress Notes (Signed)
Patient Care Team: Tisovec, Fransico Him, MD as PCP - General (Internal Medicine)  DIAGNOSIS:  Encounter Diagnosis  Name Primary?  . Malignant neoplasm of axillary tail of right breast in female, estrogen receptor positive ([place])     SUMMARY OF ONCOLOGIC HISTORY:   Malignant neoplasm of axillary tail of right breast in female, estrogen receptor positive ([place])   05/09/2017 Initial Diagnosis    May 2018: Palpable right axillary mass.  The recommended a 6-week follow-up but apparently the mass decreased in size.  Recent increase in the mass was noted, biopsy revealed Grade 1 IDC with DCIS, lymphovascular invasion present, 4 small hypoechoic masses largest 0.9 cm, T1BN1 stage Ib clinical stage    06/05/2017 Surgery    Bilateral mastectomies: Right mastectomy: IDC grade 1, 3 foci 1.2 cm, 1.2 cm, 1 cm, IgA DCIS, LVH diffuse present, margins negative, 4/7 lymph nodes positive with extracapsular extension, ER 95%, PR 95%, HER-2 negative, Ki-67 2% to 10%, T1 cN2 aM0 stage II a pathological stage; left mastectomy: Benign    06/26/2017 Surgery    Axillary lymph node dissection: 2/22 lymph nodes positive (making a total of 6+ lymph nodes)    07/22/2017 -  Chemotherapy    Adjuvant chemotherapy with dose dense Adriamycin and Cytoxan x4 followed by Taxol weekly x12      Breast cancer metastasized to axillary lymph node, right ([place])   06/05/2017 Initial Diagnosis    Breast cancer metastasized to axillary lymph node, right ([place])    07/22/2017 -  Chemotherapy    Adjuvant chemotherapy with dose dense Adriamycin and Cytoxan x4 followed by Taxol weekly x12      CHIEF COMPLIANT: Cycle 7 Taxol  INTERVAL HISTORY: Stephanie Holden is a 42 year old with above-mentioned history of bilateral mastectomies for right breast cancer who is currently on adjuvant chemotherapy with Taxol.  She appears to be tolerating Taxol fairly well.  She does have bruising of the nails.  Denies any neuropathy.  Energy levels are  excellent.  REVIEW OF SYSTEMS:   Constitutional: Denies fevers, chills or abnormal weight loss Eyes: Denies blurriness of vision Ears, nose, mouth, throat, and face: Denies mucositis or sore throat Respiratory: Denies cough, dyspnea or wheezes Cardiovascular: Denies palpitation, chest discomfort Gastrointestinal:  Denies nausea, heartburn or change in bowel habits Skin: Denies abnormal skin rashes Lymphatics: Denies new lymphadenopathy or easy bruising Neurological:Denies numbness, tingling or new weaknesses Behavioral/Psych: Mood is stable, no new changes  Extremities: No lower extremity edema Breast: Bilateral mastectomies All other systems were reviewed with the patient and are negative.  I have reviewed the past medical history, past surgical history, social history and family history with the patient and they are unchanged from previous note.  ALLERGIES:  is allergic to dilaudid [hydromorphone hcl].  MEDICATIONS:  Current Outpatient Medications  Medication Sig Dispense Refill  . acetaminophen (TYLENOL) 500 MG tablet Take 1,000 mg by mouth every 6 (six) hours as needed for moderate pain or headache.    . insulin aspart (NOVOLOG) 100 UNIT/ML injection Inject 45 Units into the skin See admin instructions. Use via insulin pump up to 45 units daily    . lidocaine-prilocaine (EMLA) cream Apply to affected area once 30 g 3  . LORazepam (ATIVAN) 0.5 MG tablet Take 1 tablet (0.5 mg total) by mouth every 6 (six) hours as needed for anxiety or sleep. 30 tablet 3  . ondansetron (ZOFRAN) 8 MG tablet Take 1 tablet (8 mg total) by mouth 2 (two) times daily as needed.  Start on the third day after chemotherapy. (Patient not taking: Reported on 10/09/2017) 30 tablet 1  . Probiotic Product (PROBIOTIC DAILY PO) Take 1 capsule by mouth at bedtime.     Marland Kitchen venlafaxine XR (EFFEXOR-XR) 37.5 MG 24 hr capsule TAKE 1 CAPSULE BY MOUTH EVERY DAY WITH BREAKFAST 30 capsule 3   No current facility-administered  medications for this visit.    Facility-Administered Medications Ordered in Other Visits  Medication Dose Route Frequency Provider Last Rate Last Dose  . sodium chloride flush (NS) 0.9 % injection 10 mL  10 mL Intracatheter PRN Nicholas Lose, MD   10 mL at 11/04/17 0855    PHYSICAL EXAMINATION: ECOG PERFORMANCE STATUS: 1 - Symptomatic but completely ambulatory  Vitals:   11/04/17 0904  BP: (!) 142/81  Pulse: 95  Resp: 18  Temp: 98 F (36.7 C)  SpO2: 100%   Filed Weights   11/04/17 0904  Weight: 167 lb 11.2 oz (76.1 kg)    GENERAL:alert, no distress and comfortable SKIN: skin color, texture, turgor are normal, no rashes or significant lesions EYES: normal, Conjunctiva are pink and non-injected, sclera clear OROPHARYNX:no exudate, no erythema and lips, buccal mucosa, and tongue normal  NECK: supple, thyroid normal size, non-tender, without nodularity LYMPH:  no palpable lymphadenopathy in the cervical, axillary or inguinal LUNGS: clear to auscultation and percussion with normal breathing effort HEART: regular rate & rhythm and no murmurs and no lower extremity edema ABDOMEN:abdomen soft, non-tender and normal bowel sounds MUSCULOSKELETAL:no cyanosis of digits and no clubbing  NEURO: alert & oriented x 3 with fluent speech, no focal motor/sensory deficits EXTREMITIES: No lower extremity edema   LABORATORY DATA:  I have reviewed the data as listed CMP Latest Ref Rng & Units 10/28/2017 10/21/2017 10/14/2017  Glucose 70 - 99 mg/dL 230(H) 178(H) 196(H)  BUN 6 - 20 mg/dL '11 12 12  ' Creatinine 0.44 - 1.00 mg/dL 0.88 0.79 0.78  Sodium 135 - 145 mmol/L 141 143 142  Potassium 3.5 - 5.1 mmol/L 4.0 3.9 3.9  Chloride 98 - 111 mmol/L 104 107 106  CO2 22 - 32 mmol/L '27 28 28  ' Calcium 8.9 - 10.3 mg/dL 9.1 9.3 9.4  Total Protein 6.5 - 8.1 g/dL 7.1 7.1 7.2  Total Bilirubin 0.3 - 1.2 mg/dL 0.6 0.4 0.5  Alkaline Phos 38 - 126 U/L 89 92 93  AST 15 - 41 U/L 45(H) 48(H) 40  ALT 0 - 44 U/L 53(H)  63(H) 63(H)    Lab Results  Component Value Date   WBC 2.7 (L) 11/04/2017   HGB 10.2 (L) 11/04/2017   HCT 30.6 (L) 11/04/2017   MCV 87.4 11/04/2017   PLT 363 11/04/2017   NEUTROABS 1.7 11/04/2017    ASSESSMENT & PLAN:  Malignant neoplasm of axillary tail of right breast in female, estrogen receptor positive ([place]) 06/05/2017:Bilateral mastectomies: Right mastectomy: IDC grade 1, 3 foci 1.2 cm, 1.2 cm, 1 cm, IgA DCIS, LVH diffuse present, margins negative, 4/7 lymph nodes positive with extracapsular extension, ER 95%, PR 95%, HER-2 negative, Ki-67 2% to 10%, T1 cN2 aM0 stage II a pathological stage; left mastectomy: Benign  Mammaprint done preoperatively: Low risk Axillary lymph node dissection 06/26/2017: 2/22 lymph nodes positive(making a total of 6 lymph nodes that were positive)  Treatment plan: 1.Because ofher high risk features (6 LN Pos),werecommended adjuvant systemic chemotherapywithdose dense Adriamycin and Cytoxan x4 followed by Taxol weekly x12 started 07/22/2017 3.Followed by radiation.  4.Followed by adjuvant antiestrogen therapy  UPBEAT clinical trial ([id number]  [id number]):No toxicities related to the trial --------------------------------------------------------------------------- Current treatment: Completed 4 cycles ofdose dense Adriamycin and Cytoxan, today cycle7Taxol Labs reviewed Echocardiogram 06/20/2017: EF 55 to 60%  Taxol toxicities: Denies any neuropathy. Slight bruising of the fingernails Monitoring her white blood cell count very closely. I discussed with her that if her blood counts continue to decline we may have to reduce the dosage. Currently we will maintain the same dose.  Monitoring closely for chemo toxicities. Return to clinic weekly for chemo is every other week for follow-up with me.   No orders of the defined types were placed in this encounter.  The patient has a good understanding of the overall plan. she agrees with it. she will  call with any problems that may develop before the next visit here.   Harriette Ohara, MD 11/04/17

## 2017-11-04 NOTE — Patient Instructions (Signed)
[facility] Discharge Instructions for Patients Receiving Chemotherapy  Today you received the following chemotherapy agents taxol  To help prevent nausea and vomiting after your treatment, we encourage you to take your nausea medication as directed   If you develop nausea and vomiting that is not controlled by your nausea medication, call the clinic.   BELOW ARE SYMPTOMS THAT SHOULD BE REPORTED IMMEDIATELY:  *FEVER GREATER THAN 100.5 F  *CHILLS WITH OR WITHOUT FEVER  NAUSEA AND VOMITING THAT IS NOT CONTROLLED WITH YOUR NAUSEA MEDICATION  *UNUSUAL SHORTNESS OF BREATH  *UNUSUAL BRUISING OR BLEEDING  TENDERNESS IN MOUTH AND THROAT WITH OR WITHOUT PRESENCE OF ULCERS  *URINARY PROBLEMS  *BOWEL PROBLEMS  UNUSUAL RASH Items with * indicate a potential emergency and should be followed up as soon as possible.  Feel free to call the clinic should you have any questions or concerns. The clinic phone number is [phone].  Please show the CHEMO ALERT CARD at check-in to the Emergency Department and triage nurse.   

## 2017-11-04 NOTE — Assessment & Plan Note (Signed)
06/05/2017:Bilateral mastectomies: Right mastectomy: IDC grade 1, 3 foci 1.2 cm, 1.2 cm, 1 cm, IgA DCIS, LVH diffuse present, margins negative, 4/7 lymph nodes positive with extracapsular extension, ER 95%, PR 95%, HER-2 negative, Ki-67 2% to 10%, T1 cN2 aM0 stage II a pathological stage; left mastectomy: Benign  Mammaprint done preoperatively: Low risk Axillary lymph node dissection 06/26/2017: 2/22 lymph nodes positive(making a total of 6 lymph nodes that were positive)  Treatment plan: 1.Because ofher high risk features (6 LN Pos),werecommended adjuvant systemic chemotherapywithdose dense Adriamycin and Cytoxan x4 followed by Taxol weekly x12 started 07/22/2017 3.Followed by radiation.  4.Followed by adjuvant antiestrogen therapy  UPBEAT clinical trial ([id number]):No toxicities related to the trial --------------------------------------------------------------------------- Current treatment: Completed 4 cycles ofdose dense Adriamycin and Cytoxan, today cycle7Taxol Labs reviewed Echocardiogram 06/20/2017: EF 55 to 60%  Taxol toxicities: Denies any neuropathy. Slight bruising of the fingernails  Monitoring closely for chemo toxicities.

## 2017-11-05 ENCOUNTER — Telehealth: Payer: Self-pay | Admitting: Hematology and Oncology

## 2017-11-05 NOTE — Telephone Encounter (Signed)
Scheduled per 8/13 los.  Mailed calendar to patient.

## 2017-11-11 ENCOUNTER — Inpatient Hospital Stay: Payer: BLUE CROSS/BLUE SHIELD

## 2017-11-11 VITALS — BP 151/84 | HR 85 | Temp 98.8°F | Resp 18

## 2017-11-11 DIAGNOSIS — Z170 Estrogen receptor positive status [ER+]: Secondary | ICD-10-CM

## 2017-11-11 DIAGNOSIS — G620 Drug-induced polyneuropathy: Secondary | ICD-10-CM | POA: Diagnosis not present

## 2017-11-11 DIAGNOSIS — C50611 Malignant neoplasm of axillary tail of right female breast: Secondary | ICD-10-CM

## 2017-11-11 DIAGNOSIS — C773 Secondary and unspecified malignant neoplasm of axilla and upper limb lymph nodes: Principal | ICD-10-CM

## 2017-11-11 DIAGNOSIS — Z5111 Encounter for antineoplastic chemotherapy: Secondary | ICD-10-CM | POA: Diagnosis not present

## 2017-11-11 DIAGNOSIS — F329 Major depressive disorder, single episode, unspecified: Secondary | ICD-10-CM | POA: Diagnosis not present

## 2017-11-11 DIAGNOSIS — C50911 Malignant neoplasm of unspecified site of right female breast: Secondary | ICD-10-CM

## 2017-11-11 DIAGNOSIS — Z9013 Acquired absence of bilateral breasts and nipples: Secondary | ICD-10-CM | POA: Diagnosis not present

## 2017-11-11 DIAGNOSIS — R5383 Other fatigue: Secondary | ICD-10-CM | POA: Diagnosis not present

## 2017-11-11 LAB — CBC WITH DIFFERENTIAL (CANCER CENTER ONLY)
BASOS ABS: 0 10*3/uL (ref 0.0–0.1)
BASOS PCT: 1 %
EOS ABS: 0 10*3/uL (ref 0.0–0.5)
EOS PCT: 2 %
HCT: 29.4 % — ABNORMAL LOW (ref 34.8–46.6)
Hemoglobin: 9.9 g/dL — ABNORMAL LOW (ref 11.6–15.9)
Lymphocytes Relative: 31 %
Lymphs Abs: 0.9 10*3/uL (ref 0.9–3.3)
MCH: 29.8 pg (ref 25.1–34.0)
MCHC: 33.6 g/dL (ref 31.5–36.0)
MCV: 88.5 fL (ref 79.5–101.0)
Monocytes Absolute: 0.3 10*3/uL (ref 0.1–0.9)
Monocytes Relative: 10 %
Neutro Abs: 1.6 10*3/uL (ref 1.5–6.5)
Neutrophils Relative %: 56 %
PLATELETS: 362 10*3/uL (ref 145–400)
RBC: 3.32 MIL/uL — ABNORMAL LOW (ref 3.70–5.45)
RDW: 23.5 % — AB (ref 11.2–14.5)
WBC Count: 2.9 10*3/uL — ABNORMAL LOW (ref 3.9–10.3)

## 2017-11-11 LAB — CMP (CANCER CENTER ONLY)
ALBUMIN: 3.6 g/dL (ref 3.5–5.0)
ALK PHOS: 86 U/L (ref 38–126)
ALT: 28 U/L (ref 0–44)
AST: 26 U/L (ref 15–41)
Anion gap: 8 (ref 5–15)
BILIRUBIN TOTAL: 0.4 mg/dL (ref 0.3–1.2)
BUN: 13 mg/dL (ref 6–20)
CALCIUM: 8.9 mg/dL (ref 8.9–10.3)
CO2: 30 mmol/L (ref 22–32)
CREATININE: 0.83 mg/dL (ref 0.44–1.00)
Chloride: 104 mmol/L (ref 98–111)
GFR, Est AFR Am: >60 mL/min (ref >60)
GFR, Estimated: >60 mL/min (ref >60)
GLUCOSE: 174 mg/dL — AB (ref 70–99)
Potassium: 4.1 mmol/L (ref 3.5–5.1)
Sodium: 142 mmol/L (ref 135–145)
TOTAL PROTEIN: 6.8 g/dL (ref 6.5–8.1)

## 2017-11-11 MED ORDER — SODIUM CHLORIDE 0.9% FLUSH
10.0000 mL | INTRAVENOUS | Status: DC | PRN
  Administered 2017-11-11: 10 mL
  Filled 2017-11-11: qty 10

## 2017-11-11 MED ORDER — FAMOTIDINE IN NACL 20-0.9 MG/50ML-% IV SOLN
40.0000 mg | Freq: Once | INTRAVENOUS | Status: AC
  Administered 2017-11-11: 40 mg via INTRAVENOUS

## 2017-11-11 MED ORDER — DIPHENHYDRAMINE HCL 25 MG PO CAPS
ORAL_CAPSULE | ORAL | Status: AC
  Filled 2017-11-11: qty 1

## 2017-11-11 MED ORDER — SODIUM CHLORIDE 0.9 % IV SOLN
80.0000 mg/m2 | Freq: Once | INTRAVENOUS | Status: AC
  Administered 2017-11-11: 144 mg via INTRAVENOUS
  Filled 2017-11-11: qty 24

## 2017-11-11 MED ORDER — FAMOTIDINE IN NACL 20-0.9 MG/50ML-% IV SOLN
INTRAVENOUS | Status: AC
  Filled 2017-11-11: qty 50

## 2017-11-11 MED ORDER — SODIUM CHLORIDE 0.9 % IV SOLN
Freq: Once | INTRAVENOUS | Status: AC
  Administered 2017-11-11: 10:00:00 via INTRAVENOUS
  Filled 2017-11-11: qty 250

## 2017-11-11 MED ORDER — DIPHENHYDRAMINE HCL 25 MG PO CAPS
25.0000 mg | ORAL_CAPSULE | Freq: Once | ORAL | Status: AC
  Administered 2017-11-11: 25 mg via ORAL

## 2017-11-11 MED ORDER — HEPARIN SOD (PORK) LOCK FLUSH 100 UNIT/ML IV SOLN
500.0000 [IU] | Freq: Once | INTRAVENOUS | Status: AC | PRN
  Administered 2017-11-11: 500 [IU]
  Filled 2017-11-11: qty 5

## 2017-11-11 NOTE — Patient Instructions (Signed)
[facility] Discharge Instructions for Patients Receiving Chemotherapy  Today you received the following chemotherapy agents:  Taxol.  To help prevent nausea and vomiting after your treatment, we encourage you to take your nausea medication as directed.   If you develop nausea and vomiting that is not controlled by your nausea medication, call the clinic.   BELOW ARE SYMPTOMS THAT SHOULD BE REPORTED IMMEDIATELY:  *FEVER GREATER THAN 100.5 F  *CHILLS WITH OR WITHOUT FEVER  NAUSEA AND VOMITING THAT IS NOT CONTROLLED WITH YOUR NAUSEA MEDICATION  *UNUSUAL SHORTNESS OF BREATH  *UNUSUAL BRUISING OR BLEEDING  TENDERNESS IN MOUTH AND THROAT WITH OR WITHOUT PRESENCE OF ULCERS  *URINARY PROBLEMS  *BOWEL PROBLEMS  UNUSUAL RASH Items with * indicate a potential emergency and should be followed up as soon as possible.  Feel free to call the clinic should you have any questions or concerns. The clinic phone number is [phone].  Please show the CHEMO ALERT CARD at check-in to the Emergency Department and triage nurse.   

## 2017-11-12 ENCOUNTER — Ambulatory Visit (HOSPITAL_COMMUNITY)
Admission: RE | Admit: 2017-11-12 | Discharge: 2017-11-12 | Disposition: A | Discharge status: Home / Self Care | Payer: BLUE CROSS/BLUE SHIELD | Source: Ambulatory Visit | Attending: Hematology and Oncology | Admitting: Hematology and Oncology

## 2017-11-12 ENCOUNTER — Encounter: Payer: Self-pay | Admitting: *Deleted

## 2017-11-12 DIAGNOSIS — Z170 Estrogen receptor positive status [ER+]: Secondary | ICD-10-CM

## 2017-11-12 DIAGNOSIS — C50611 Malignant neoplasm of axillary tail of right female breast: Secondary | ICD-10-CM

## 2017-11-12 DIAGNOSIS — Z006 Encounter for examination for normal comparison and control in clinical research program: Secondary | ICD-10-CM | POA: Diagnosis not present

## 2017-11-12 NOTE — Progress Notes (Signed)
3 MONTH VISIT FOR UPBEAT ([id number]) STUDY;  Patient into clinic today to complete her study activities for 3 month time point on the Upbeat study.  Greeted patient in the lobby and thanked her for her ongoing participation in this clinical trial.  Patient states she is feeling well and denies any new problems or concerns to report to MD.  She denies any acute side effects of her ongoing treatment other than some fatigue.  Patient has completed 3 month Research Labs, Medication Review and Questionnaires on previous visits. Copy of research lab results given to patient.  The following activities were completed today:  Cardiac MRI- patient completed prior to coming to cancer center this afternoon.  Height and Weight, Blood pressure and HR- research nurse obtained per protocol.  Neurocognitive Testing- administered by Farris Has, research assistant.  Physical Functions Testing- completed by this research nurse with the assistance of another research nurse, Otilio Miu. Patient tolerated all activities without difficulty.  Gift card given for completing this time point.  Reviewed future study activities will be due one year from starting chemotherapy +/- 60 days of 07/23/18.  Research nurse will contact patient closer to that time to set up appointments.  Patient informs she will undergo radiation therapy after she finishes chemotherapy.  Informed patient that research nurse will collect information regarding her radiation treatment.  Encouraged patient to contact research nurse if any questions or concerns prior to her next research visit. Thanked patient again for her time spent on this study.  She verbalized understanding. Foye Spurling, BSN, RN Clinical Research Nurse 11/12/2017 2:00 PM

## 2017-11-13 ENCOUNTER — Other Ambulatory Visit: Payer: Self-pay

## 2017-11-13 ENCOUNTER — Telehealth: Payer: Self-pay

## 2017-11-13 DIAGNOSIS — Z794 Long term (current) use of insulin: Secondary | ICD-10-CM | POA: Diagnosis not present

## 2017-11-13 DIAGNOSIS — E109 Type 1 diabetes mellitus without complications: Secondary | ICD-10-CM | POA: Diagnosis not present

## 2017-11-13 MED ORDER — OXYCODONE-ACETAMINOPHEN 5-325 MG PO TABS: 1.0000 | ORAL_TABLET | Freq: Three times a day (TID) | ORAL | 0 refills | Status: DC | PRN

## 2017-11-13 NOTE — Telephone Encounter (Signed)
Patient called c/o severe joint pain.  Per patient mainly in the knees and shoulder.  Rated pain 6/10.  Patient has tried Tylenol with no effect.  Patient denies fever, chills, or any injury.  Affecting ability to sleep, and do ADL's.  Per Dr. Lindi Adie okay for Percocet 5/325 1 tablet by mouth Q 8 hours, #60 with no refills.  Patient aware that RX has to be picked up.  Script placed in Rx book.  Patient has no further needs at this time.

## 2017-11-15 NOTE — Progress Notes (Signed)
Error

## 2017-11-18 ENCOUNTER — Inpatient Hospital Stay: Payer: BLUE CROSS/BLUE SHIELD

## 2017-11-18 ENCOUNTER — Encounter: Payer: Self-pay | Admitting: *Deleted

## 2017-11-18 ENCOUNTER — Inpatient Hospital Stay (HOSPITAL_BASED_OUTPATIENT_CLINIC_OR_DEPARTMENT_OTHER): Payer: BLUE CROSS/BLUE SHIELD | Admitting: Hematology and Oncology

## 2017-11-18 DIAGNOSIS — Z5111 Encounter for antineoplastic chemotherapy: Secondary | ICD-10-CM | POA: Diagnosis not present

## 2017-11-18 DIAGNOSIS — Z170 Estrogen receptor positive status [ER+]: Secondary | ICD-10-CM

## 2017-11-18 DIAGNOSIS — C50611 Malignant neoplasm of axillary tail of right female breast: Secondary | ICD-10-CM

## 2017-11-18 DIAGNOSIS — C773 Secondary and unspecified malignant neoplasm of axilla and upper limb lymph nodes: Secondary | ICD-10-CM

## 2017-11-18 DIAGNOSIS — R5383 Other fatigue: Secondary | ICD-10-CM

## 2017-11-18 DIAGNOSIS — Z9013 Acquired absence of bilateral breasts and nipples: Secondary | ICD-10-CM | POA: Diagnosis not present

## 2017-11-18 DIAGNOSIS — G620 Drug-induced polyneuropathy: Secondary | ICD-10-CM | POA: Diagnosis not present

## 2017-11-18 DIAGNOSIS — C50911 Malignant neoplasm of unspecified site of right female breast: Secondary | ICD-10-CM

## 2017-11-18 DIAGNOSIS — F329 Major depressive disorder, single episode, unspecified: Secondary | ICD-10-CM | POA: Diagnosis not present

## 2017-11-18 LAB — CBC WITH DIFFERENTIAL (CANCER CENTER ONLY)
Basophils Absolute: 0 10*3/uL (ref 0.0–0.1)
Basophils Relative: 1 %
EOS PCT: 1 %
Eosinophils Absolute: 0 10*3/uL (ref 0.0–0.5)
HCT: 32.1 % — ABNORMAL LOW (ref 34.8–46.6)
Hemoglobin: 10.4 g/dL — ABNORMAL LOW (ref 11.6–15.9)
LYMPHS ABS: 1.1 10*3/uL (ref 0.9–3.3)
LYMPHS PCT: 31 %
MCH: 29.2 pg (ref 25.1–34.0)
MCHC: 32.4 g/dL (ref 31.5–36.0)
MCV: 90.2 fL (ref 79.5–101.0)
MONO ABS: 0.3 10*3/uL (ref 0.1–0.9)
Monocytes Relative: 10 %
Neutro Abs: 2 10*3/uL (ref 1.5–6.5)
Neutrophils Relative %: 57 %
Platelet Count: 393 10*3/uL (ref 145–400)
RBC: 3.56 MIL/uL — AB (ref 3.70–5.45)
RDW: 19.3 % — AB (ref 11.2–14.5)
WBC Count: 3.5 10*3/uL — ABNORMAL LOW (ref 3.9–10.3)

## 2017-11-18 LAB — CMP (CANCER CENTER ONLY)
ALBUMIN: 3.9 g/dL (ref 3.5–5.0)
ALT: 36 U/L (ref 0–44)
AST: 34 U/L (ref 15–41)
Alkaline Phosphatase: 86 U/L (ref 38–126)
Anion gap: 7 (ref 5–15)
BUN: 10 mg/dL (ref 6–20)
CHLORIDE: 103 mmol/L (ref 98–111)
CO2: 29 mmol/L (ref 22–32)
Calcium: 9.4 mg/dL (ref 8.9–10.3)
Creatinine: 0.86 mg/dL (ref 0.44–1.00)
GFR, Est AFR Am: >60 mL/min (ref >60)
GFR, Estimated: >60 mL/min (ref >60)
GLUCOSE: 206 mg/dL — AB (ref 70–99)
Potassium: 4.7 mmol/L (ref 3.5–5.1)
Sodium: 139 mmol/L (ref 135–145)
Total Bilirubin: 0.4 mg/dL (ref 0.3–1.2)
Total Protein: 7.4 g/dL (ref 6.5–8.1)

## 2017-11-18 MED ORDER — FAMOTIDINE IN NACL 20-0.9 MG/50ML-% IV SOLN
INTRAVENOUS | Status: AC
  Filled 2017-11-18: qty 50

## 2017-11-18 MED ORDER — HEPARIN SOD (PORK) LOCK FLUSH 100 UNIT/ML IV SOLN
500.0000 [IU] | Freq: Once | INTRAVENOUS | Status: AC | PRN
  Administered 2017-11-18: 500 [IU]
  Filled 2017-11-18: qty 5

## 2017-11-18 MED ORDER — DIPHENHYDRAMINE HCL 25 MG PO CAPS
25.0000 mg | ORAL_CAPSULE | Freq: Once | ORAL | Status: AC
  Administered 2017-11-18: 25 mg via ORAL

## 2017-11-18 MED ORDER — DIPHENHYDRAMINE HCL 25 MG PO CAPS
ORAL_CAPSULE | ORAL | Status: AC
  Filled 2017-11-18: qty 1

## 2017-11-18 MED ORDER — SODIUM CHLORIDE 0.9 % IV SOLN
Freq: Once | INTRAVENOUS | Status: AC
  Administered 2017-11-18: 12:00:00 via INTRAVENOUS
  Filled 2017-11-18: qty 250

## 2017-11-18 MED ORDER — FAMOTIDINE IN NACL 20-0.9 MG/50ML-% IV SOLN
40.0000 mg | Freq: Once | INTRAVENOUS | Status: AC
  Administered 2017-11-18: 40 mg via INTRAVENOUS

## 2017-11-18 MED ORDER — SODIUM CHLORIDE 0.9 % IV SOLN
65.0000 mg/m2 | Freq: Once | INTRAVENOUS | Status: AC
  Administered 2017-11-18: 120 mg via INTRAVENOUS
  Filled 2017-11-18: qty 20

## 2017-11-18 MED ORDER — VENLAFAXINE HCL ER 75 MG PO CP24: 75.0000 mg | ORAL_CAPSULE | Freq: Every day | ORAL | 3 refills | Status: DC

## 2017-11-18 MED ORDER — FAMOTIDINE IN NACL 20-0.9 MG/50ML-% IV SOLN
INTRAVENOUS | Status: AC
  Filled 2017-11-18: qty 100

## 2017-11-18 MED ORDER — SODIUM CHLORIDE 0.9% FLUSH
10.0000 mL | INTRAVENOUS | Status: DC | PRN
  Administered 2017-11-18: 10 mL
  Filled 2017-11-18: qty 10

## 2017-11-18 NOTE — Progress Notes (Signed)
Patient Care Team: Tisovec, Fransico Him, MD as PCP - General (Internal Medicine)  DIAGNOSIS:  Encounter Diagnosis  Name Primary?  . Malignant neoplasm of axillary tail of right breast in female, estrogen receptor positive ([place])     SUMMARY OF ONCOLOGIC HISTORY:   Malignant neoplasm of axillary tail of right breast in female, estrogen receptor positive ([place])   05/09/2017 Initial Diagnosis    May 2018: Palpable right axillary mass.  The recommended a 6-week follow-up but apparently the mass decreased in size.  Recent increase in the mass was noted, biopsy revealed Grade 1 IDC with DCIS, lymphovascular invasion present, 4 small hypoechoic masses largest 0.9 cm, T1BN1 stage Ib clinical stage    06/05/2017 Surgery    Bilateral mastectomies: Right mastectomy: IDC grade 1, 3 foci 1.2 cm, 1.2 cm, 1 cm, IgA DCIS, LVH diffuse present, margins negative, 4/7 lymph nodes positive with extracapsular extension, ER 95%, PR 95%, HER-2 negative, Ki-67 2% to 10%, T1 cN2 aM0 stage II a pathological stage; left mastectomy: Benign    06/26/2017 Surgery    Axillary lymph node dissection: 2/22 lymph nodes positive (making a total of 6+ lymph nodes)    07/22/2017 -  Chemotherapy    Adjuvant chemotherapy with dose dense Adriamycin and Cytoxan x4 followed by Taxol weekly x12      Breast cancer metastasized to axillary lymph node, right ([place])   06/05/2017 Initial Diagnosis    Breast cancer metastasized to axillary lymph node, right ([place])    07/22/2017 -  Chemotherapy    Adjuvant chemotherapy with dose dense Adriamycin and Cytoxan x4 followed by Taxol weekly x12      CHIEF COMPLIANT: Cycle 9 Taxol  INTERVAL HISTORY: Stephanie Holden is a 42 year old with above-mentioned history of bilateral mastectomies breast cancer who is currently on adjuvant chemotherapy today cycle 9 of Taxol.  She is complaining that in the fingers she is having funny sensation and has had slight difficulty with writing her name recently.   She was experiencing markedly worsening fatigue.  This is bothering her so significantly that she is crying in our office and is being very tearful.  She was started on Effexor but it has not yet been helping her significantly with depression and crying symptoms.  REVIEW OF SYSTEMS:   Constitutional: Denies fevers, chills or abnormal weight loss Eyes: Denies blurriness of vision Ears, nose, mouth, throat, and face: Denies mucositis or sore throat Respiratory: Denies cough, dyspnea or wheezes Cardiovascular: Denies palpitation, chest discomfort Gastrointestinal:  Denies nausea, heartburn or change in bowel habits Skin: Denies abnormal skin rashes Lymphatics: Denies new lymphadenopathy or easy bruising Neurological:Denies numbness, tingling or new weaknesses Behavioral/Psych: Mood is stable, no new changes  Extremities: No lower extremity edema Breast: Bilateral mastectomies, complaining that the left chest wall is extra tissue that needs to be removed. All other systems were reviewed with the patient and are negative.  I have reviewed the past medical history, past surgical history, social history and family history with the patient and they are unchanged from previous note.  ALLERGIES:  is allergic to dilaudid [hydromorphone hcl].  MEDICATIONS:  Current Outpatient Medications  Medication Sig Dispense Refill  . acetaminophen (TYLENOL) 500 MG tablet Take 1,000 mg by mouth every 6 (six) hours as needed for moderate pain or headache.    . insulin aspart (NOVOLOG) 100 UNIT/ML injection Inject 45 Units into the skin See admin instructions. Use via insulin pump up to 45 units daily    . lidocaine-prilocaine (EMLA)  cream Apply to affected area once 30 g 3  . LORazepam (ATIVAN) 0.5 MG tablet Take 1 tablet (0.5 mg total) by mouth every 6 (six) hours as needed for anxiety or sleep. 30 tablet 3  . ondansetron (ZOFRAN) 8 MG tablet Take 1 tablet (8 mg total) by mouth 2 (two) times daily as needed.  Start on the third day after chemotherapy. (Patient not taking: Reported on 10/09/2017) 30 tablet 1  . oxyCODONE-acetaminophen (PERCOCET/ROXICET) 5-325 MG tablet Take 1 tablet by mouth every 8 (eight) hours as needed for severe pain. 60 tablet 0  . Probiotic Product (PROBIOTIC DAILY PO) Take 1 capsule by mouth at bedtime.     Marland Kitchen venlafaxine XR (EFFEXOR-XR) 75 MG 24 hr capsule Take 1 capsule (75 mg total) by mouth daily with breakfast. 30 capsule 3   No current facility-administered medications for this visit.     PHYSICAL EXAMINATION: ECOG PERFORMANCE STATUS: 1 - Symptomatic but completely ambulatory  Vitals:   11/18/17 1014  BP: (!) 145/88  Pulse: 88  Resp: 17  Temp: 97.9 F (36.6 C)  SpO2: 99%   Filed Weights   11/18/17 1014  Weight: 168 lb 8 oz (76.4 kg)    GENERAL:alert, no distress and comfortable SKIN: skin color, texture, turgor are normal, no rashes or significant lesions EYES: normal, Conjunctiva are pink and non-injected, sclera clear OROPHARYNX:no exudate, no erythema and lips, buccal mucosa, and tongue normal  NECK: supple, thyroid normal size, non-tender, without nodularity LYMPH:  no palpable lymphadenopathy in the cervical, axillary or inguinal LUNGS: clear to auscultation and percussion with normal breathing effort HEART: regular rate & rhythm and no murmurs and no lower extremity edema ABDOMEN:abdomen soft, non-tender and normal bowel sounds MUSCULOSKELETAL:no cyanosis of digits and no clubbing  NEURO: alert & oriented x 3 with fluent speech, no focal motor/sensory deficits EXTREMITIES: No lower extremity edema   LABORATORY DATA:  I have reviewed the data as listed CMP Latest Ref Rng & Units 11/18/2017 11/11/2017 11/04/2017  Glucose 70 - 99 mg/dL 206(H) 174(H) 176(H)  BUN 6 - 20 mg/dL _0 Creatinine 0.44 - 1.00 mg/dL 0.86 0.83 0.86  Sodium 135 - 145 mmol/L 139 142 142  Potassium 3.5 - 5.1 mmol/L 4.7 4.1 4.1  Chloride 98 - 111 mmol/L 103 104 105  CO2 22  - 32 mmol/L _1 Calcium 8.9 - 10.3 mg/dL 9.4 8.9 9.0  Total Protein 6.5 - 8.1 g/dL 7.4 6.8 7.2  Total Bilirubin 0.3 - 1.2 mg/dL 0.4 0.4 0.6  Alkaline Phos 38 - 126 U/L 86 86 83  AST 15 - 41 U/L 34 26 32  ALT 0 - 44 U/L 36 28 44    Lab Results  Component Value Date   WBC 3.5 (L) 11/18/2017   HGB 10.4 (L) 11/18/2017   HCT 32.1 (L) 11/18/2017   MCV 90.2 11/18/2017   PLT 393 11/18/2017   NEUTROABS 2.0 11/18/2017    ASSESSMENT & PLAN:  Malignant neoplasm of axillary tail of right breast in female, estrogen receptor positive ([place]) 06/05/2017:Bilateral mastectomies: Right mastectomy: IDC grade 1, 3 foci 1.2 cm, 1.2 cm, 1 cm, IgA DCIS, LVH diffuse present, margins negative, 4/7 lymph nodes positive with extracapsular extension, ER 95%, PR 95%, HER-2 negative, Ki-67 2% to 10%, T1 cN2 aM0 stage II a pathological stage; left mastectomy: Benign  Mammaprint done preoperatively: Low risk Axillary lymph node dissection 06/26/2017: 2/22 lymph nodes positive(making a total of 6 lymph nodes that were positive)  Treatment plan: 1.Because ofher high risk features (6 LN Pos),werecommended adjuvant systemic chemotherapywithdose dense Adriamycin and Cytoxan x4 followed by Taxol weekly x12 started 07/22/2017 3.Followed by radiation.  4.Followed by adjuvant antiestrogen therapy  UPBEAT clinical trial ([id number]):No toxicities related to the trial --------------------------------------------------------------------------- Current treatment: Completed 4 cycles ofdose dense Adriamycin and Cytoxan, today cycle9Taxol Labs reviewed Echocardiogram 06/20/2017: EF 55 to 60%  Taxol toxicities: Neuropathy grades 1-2 Slight bruising of the fingernails Monitoring her white blood cell count very closely. I discussed with her that if her blood counts continue to decline we may have to reduce the dosage.  Chemo-induced peripheral neuropathy: I decreased the dosage of chemotherapy  today. Depression: Increase the dosage of Effexor to 75 mg daily. Patient is contemplating on reconstruction to make sure that the extra skin tissue on the left chest wall can be removed.  Monitoring closely for chemo toxicities. Return to clinic weekly for chemo is every other week for follow-up with me.   No orders of the defined types were placed in this encounter.  The patient has a good understanding of the overall plan. she agrees with it. she will call with any problems that may develop before the next visit here.   Harriette Ohara, MD 11/18/17

## 2017-11-18 NOTE — Patient Instructions (Signed)
Cancer Center Discharge Instructions for Patients Receiving Chemotherapy  Today you received the following chemotherapy agents: Paclitaxel (Taxol)  To help prevent nausea and vomiting after your treatment, we encourage you to take your nausea medication as prescribed. If you develop nausea and vomiting that is not controlled by your nausea medication, call the clinic.   BELOW ARE SYMPTOMS THAT SHOULD BE REPORTED IMMEDIATELY:  *FEVER GREATER THAN 100.5 F  *CHILLS WITH OR WITHOUT FEVER  NAUSEA AND VOMITING THAT IS NOT CONTROLLED WITH YOUR NAUSEA MEDICATION  *UNUSUAL SHORTNESS OF BREATH  *UNUSUAL BRUISING OR BLEEDING  TENDERNESS IN MOUTH AND THROAT WITH OR WITHOUT PRESENCE OF ULCERS  *URINARY PROBLEMS  *BOWEL PROBLEMS  UNUSUAL RASH Items with * indicate a potential emergency and should be followed up as soon as possible.  Feel free to call the clinic should you have any questions or concerns. The clinic phone number is [phone].  Please show the CHEMO ALERT CARD at check-in to the Emergency Department and triage nurse.   

## 2017-11-18 NOTE — Assessment & Plan Note (Signed)
06/05/2017:Bilateral mastectomies: Right mastectomy: IDC grade 1, 3 foci 1.2 cm, 1.2 cm, 1 cm, IgA DCIS, LVH diffuse present, margins negative, 4/7 lymph nodes positive with extracapsular extension, ER 95%, PR 95%, HER-2 negative, Ki-67 2% to 10%, T1 cN2 aM0 stage II a pathological stage; left mastectomy: Benign  Mammaprint done preoperatively: Low risk Axillary lymph node dissection 06/26/2017: 2/22 lymph nodes positive(making a total of 6 lymph nodes that were positive)  Treatment plan: 1.Because ofher high risk features (6 LN Pos),werecommended adjuvant systemic chemotherapywithdose dense Adriamycin and Cytoxan x4 followed by Taxol weekly x12 started 07/22/2017 3.Followed by radiation.  4.Followed by adjuvant antiestrogen therapy  UPBEAT clinical trial ([id number]):No toxicities related to the trial --------------------------------------------------------------------------- Current treatment: Completed 4 cycles ofdose dense Adriamycin and Cytoxan, today cycle9Taxol Labs reviewed Echocardiogram 06/20/2017: EF 55 to 60%  Taxol toxicities: Denies any neuropathy. Slight bruising of the fingernails Monitoring her white blood cell count very closely. I discussed with her that if her blood counts continue to decline we may have to reduce the dosage. Currently we will maintain the same dose.  Monitoring closely for chemo toxicities. Return to clinic weekly for chemo is every other week for follow-up with me.

## 2017-11-25 ENCOUNTER — Inpatient Hospital Stay: Payer: BLUE CROSS/BLUE SHIELD

## 2017-11-25 ENCOUNTER — Inpatient Hospital Stay: Payer: BLUE CROSS/BLUE SHIELD | Attending: Hematology and Oncology

## 2017-11-25 VITALS — BP 160/95 | HR 97 | Temp 98.8°F | Resp 18

## 2017-11-25 DIAGNOSIS — C50611 Malignant neoplasm of axillary tail of right female breast: Secondary | ICD-10-CM | POA: Diagnosis not present

## 2017-11-25 DIAGNOSIS — R5383 Other fatigue: Secondary | ICD-10-CM | POA: Diagnosis not present

## 2017-11-25 DIAGNOSIS — Z5111 Encounter for antineoplastic chemotherapy: Secondary | ICD-10-CM | POA: Insufficient documentation

## 2017-11-25 DIAGNOSIS — Z170 Estrogen receptor positive status [ER+]: Secondary | ICD-10-CM | POA: Diagnosis not present

## 2017-11-25 DIAGNOSIS — C50911 Malignant neoplasm of unspecified site of right female breast: Secondary | ICD-10-CM

## 2017-11-25 DIAGNOSIS — C773 Secondary and unspecified malignant neoplasm of axilla and upper limb lymph nodes: Secondary | ICD-10-CM | POA: Insufficient documentation

## 2017-11-25 DIAGNOSIS — F329 Major depressive disorder, single episode, unspecified: Secondary | ICD-10-CM | POA: Insufficient documentation

## 2017-11-25 DIAGNOSIS — R202 Paresthesia of skin: Secondary | ICD-10-CM | POA: Diagnosis not present

## 2017-11-25 LAB — CBC WITH DIFFERENTIAL (CANCER CENTER ONLY)
Basophils Absolute: 0 10*3/uL (ref 0.0–0.1)
Basophils Relative: 1 %
EOS PCT: 1 %
Eosinophils Absolute: 0 10*3/uL (ref 0.0–0.5)
HEMATOCRIT: 32.8 % — AB (ref 34.8–46.6)
HEMOGLOBIN: 10.6 g/dL — AB (ref 11.6–15.9)
LYMPHS ABS: 1.2 10*3/uL (ref 0.9–3.3)
LYMPHS PCT: 28 %
MCH: 29.8 pg (ref 25.1–34.0)
MCHC: 32.3 g/dL (ref 31.5–36.0)
MCV: 92.1 fL (ref 79.5–101.0)
MONO ABS: 0.4 10*3/uL (ref 0.1–0.9)
Monocytes Relative: 9 %
Neutro Abs: 2.6 10*3/uL (ref 1.5–6.5)
Neutrophils Relative %: 61 %
Platelet Count: 405 10*3/uL — ABNORMAL HIGH (ref 145–400)
RBC: 3.56 MIL/uL — AB (ref 3.70–5.45)
RDW: 19.3 % — AB (ref 11.2–14.5)
WBC: 4.1 10*3/uL (ref 3.9–10.3)
nRBC: 1 /100 WBC — ABNORMAL HIGH

## 2017-11-25 LAB — CMP (CANCER CENTER ONLY)
ALBUMIN: 3.7 g/dL (ref 3.5–5.0)
ALT: 28 U/L (ref 0–44)
AST: 27 U/L (ref 15–41)
Alkaline Phosphatase: 86 U/L (ref 38–126)
Anion gap: 8 (ref 5–15)
BUN: 16 mg/dL (ref 6–20)
CHLORIDE: 104 mmol/L (ref 98–111)
CO2: 29 mmol/L (ref 22–32)
Calcium: 9.5 mg/dL (ref 8.9–10.3)
Creatinine: 0.84 mg/dL (ref 0.44–1.00)
GFR, Est AFR Am: >60 mL/min (ref >60)
GFR, Estimated: >60 mL/min (ref >60)
GLUCOSE: 163 mg/dL — AB (ref 70–99)
POTASSIUM: 4.2 mmol/L (ref 3.5–5.1)
SODIUM: 141 mmol/L (ref 135–145)
Total Bilirubin: 0.5 mg/dL (ref 0.3–1.2)
Total Protein: 7.2 g/dL (ref 6.5–8.1)

## 2017-11-25 MED ORDER — DIPHENHYDRAMINE HCL 25 MG PO CAPS
25.0000 mg | ORAL_CAPSULE | Freq: Once | ORAL | Status: AC
  Administered 2017-11-25: 25 mg via ORAL

## 2017-11-25 MED ORDER — SODIUM CHLORIDE 0.9 % IV SOLN
Freq: Once | INTRAVENOUS | Status: AC
  Administered 2017-11-25: 16:00:00 via INTRAVENOUS
  Filled 2017-11-25: qty 250

## 2017-11-25 MED ORDER — FAMOTIDINE IN NACL 20-0.9 MG/50ML-% IV SOLN
INTRAVENOUS | Status: AC
  Filled 2017-11-25: qty 50

## 2017-11-25 MED ORDER — HEPARIN SOD (PORK) LOCK FLUSH 100 UNIT/ML IV SOLN
500.0000 [IU] | Freq: Once | INTRAVENOUS | Status: AC | PRN
  Administered 2017-11-25: 500 [IU]
  Filled 2017-11-25: qty 5

## 2017-11-25 MED ORDER — DIPHENHYDRAMINE HCL 25 MG PO CAPS
ORAL_CAPSULE | ORAL | Status: AC
  Filled 2017-11-25: qty 1

## 2017-11-25 MED ORDER — SODIUM CHLORIDE 0.9% FLUSH
10.0000 mL | INTRAVENOUS | Status: DC | PRN
  Administered 2017-11-25: 10 mL
  Filled 2017-11-25: qty 10

## 2017-11-25 MED ORDER — FAMOTIDINE IN NACL 20-0.9 MG/50ML-% IV SOLN
40.0000 mg | Freq: Once | INTRAVENOUS | Status: AC
  Administered 2017-11-25: 40 mg via INTRAVENOUS

## 2017-11-25 MED ORDER — SODIUM CHLORIDE 0.9 % IV SOLN
65.0000 mg/m2 | Freq: Once | INTRAVENOUS | Status: AC
  Administered 2017-11-25: 120 mg via INTRAVENOUS
  Filled 2017-11-25: qty 20

## 2017-11-25 NOTE — Patient Instructions (Signed)
[facility] Discharge Instructions for Patients Receiving Chemotherapy  Today you received the following chemotherapy agents: Paclitaxel (Taxol)  To help prevent nausea and vomiting after your treatment, we encourage you to take your nausea medication as prescribed. If you develop nausea and vomiting that is not controlled by your nausea medication, call the clinic.   BELOW ARE SYMPTOMS THAT SHOULD BE REPORTED IMMEDIATELY:  *FEVER GREATER THAN 100.5 F  *CHILLS WITH OR WITHOUT FEVER  NAUSEA AND VOMITING THAT IS NOT CONTROLLED WITH YOUR NAUSEA MEDICATION  *UNUSUAL SHORTNESS OF BREATH  *UNUSUAL BRUISING OR BLEEDING  TENDERNESS IN MOUTH AND THROAT WITH OR WITHOUT PRESENCE OF ULCERS  *URINARY PROBLEMS  *BOWEL PROBLEMS  UNUSUAL RASH Items with * indicate a potential emergency and should be followed up as soon as possible.  Feel free to call the clinic should you have any questions or concerns. The clinic phone number is [phone].  Please show the CHEMO ALERT CARD at check-in to the Emergency Department and triage nurse.   

## 2017-11-28 ENCOUNTER — Other Ambulatory Visit: Payer: Self-pay | Admitting: Hematology and Oncology

## 2017-12-02 ENCOUNTER — Inpatient Hospital Stay (HOSPITAL_BASED_OUTPATIENT_CLINIC_OR_DEPARTMENT_OTHER): Payer: BLUE CROSS/BLUE SHIELD | Admitting: Hematology and Oncology

## 2017-12-02 ENCOUNTER — Inpatient Hospital Stay: Payer: BLUE CROSS/BLUE SHIELD

## 2017-12-02 ENCOUNTER — Telehealth: Payer: Self-pay

## 2017-12-02 ENCOUNTER — Encounter: Payer: Self-pay | Admitting: *Deleted

## 2017-12-02 VITALS — BP 142/89 | HR 102 | Temp 97.9°F | Resp 20 | Ht 62.5 in | Wt 169.0 lb

## 2017-12-02 DIAGNOSIS — C773 Secondary and unspecified malignant neoplasm of axilla and upper limb lymph nodes: Secondary | ICD-10-CM

## 2017-12-02 DIAGNOSIS — C50911 Malignant neoplasm of unspecified site of right female breast: Secondary | ICD-10-CM

## 2017-12-02 DIAGNOSIS — R202 Paresthesia of skin: Secondary | ICD-10-CM | POA: Diagnosis not present

## 2017-12-02 DIAGNOSIS — C50611 Malignant neoplasm of axillary tail of right female breast: Secondary | ICD-10-CM

## 2017-12-02 DIAGNOSIS — N390 Urinary tract infection, site not specified: Secondary | ICD-10-CM

## 2017-12-02 DIAGNOSIS — Z170 Estrogen receptor positive status [ER+]: Secondary | ICD-10-CM

## 2017-12-02 DIAGNOSIS — F329 Major depressive disorder, single episode, unspecified: Secondary | ICD-10-CM | POA: Diagnosis not present

## 2017-12-02 DIAGNOSIS — R5383 Other fatigue: Secondary | ICD-10-CM

## 2017-12-02 DIAGNOSIS — Z5111 Encounter for antineoplastic chemotherapy: Secondary | ICD-10-CM | POA: Diagnosis not present

## 2017-12-02 LAB — URINALYSIS, COMPLETE (UACMP) WITH MICROSCOPIC
BACTERIA UA: NONE SEEN
BILIRUBIN URINE: NEGATIVE
Glucose, UA: >=500 mg/dL — AB
Ketones, ur: NEGATIVE mg/dL
Nitrite: NEGATIVE
PROTEIN: NEGATIVE mg/dL
RBC / HPF: >50 RBC/hpf — ABNORMAL HIGH (ref 0–5)
SPECIFIC GRAVITY, URINE: 1.023 (ref 1.005–1.030)
pH: 5 (ref 5.0–8.0)

## 2017-12-02 LAB — CMP (CANCER CENTER ONLY)
ALBUMIN: 3.6 g/dL (ref 3.5–5.0)
ALT: 25 U/L (ref 0–44)
AST: 24 U/L (ref 15–41)
Alkaline Phosphatase: 84 U/L (ref 38–126)
Anion gap: 10 (ref 5–15)
BUN: 9 mg/dL (ref 6–20)
CHLORIDE: 104 mmol/L (ref 98–111)
CO2: 27 mmol/L (ref 22–32)
Calcium: 9.2 mg/dL (ref 8.9–10.3)
Creatinine: 0.93 mg/dL (ref 0.44–1.00)
GFR, Est AFR Am: >60 mL/min (ref >60)
GFR, Estimated: >60 mL/min (ref >60)
GLUCOSE: 252 mg/dL — AB (ref 70–99)
POTASSIUM: 4.5 mmol/L (ref 3.5–5.1)
Sodium: 141 mmol/L (ref 135–145)
TOTAL PROTEIN: 7 g/dL (ref 6.5–8.1)
Total Bilirubin: 0.5 mg/dL (ref 0.3–1.2)

## 2017-12-02 LAB — CBC WITH DIFFERENTIAL (CANCER CENTER ONLY)
Basophils Absolute: 0.1 10*3/uL (ref 0.0–0.1)
Basophils Relative: 1 %
EOS PCT: 1 %
Eosinophils Absolute: 0 10*3/uL (ref 0.0–0.5)
HCT: 33.6 % — ABNORMAL LOW (ref 34.8–46.6)
Hemoglobin: 11 g/dL — ABNORMAL LOW (ref 11.6–15.9)
LYMPHS ABS: 0.9 10*3/uL (ref 0.9–3.3)
Lymphocytes Relative: 18 %
MCH: 30.3 pg (ref 25.1–34.0)
MCHC: 32.8 g/dL (ref 31.5–36.0)
MCV: 92.4 fL (ref 79.5–101.0)
MONO ABS: 0.4 10*3/uL (ref 0.1–0.9)
Monocytes Relative: 8 %
Neutro Abs: 3.4 10*3/uL (ref 1.5–6.5)
Neutrophils Relative %: 72 %
PLATELETS: 378 10*3/uL (ref 145–400)
RBC: 3.64 MIL/uL — ABNORMAL LOW (ref 3.70–5.45)
RDW: 21.2 % — AB (ref 11.2–14.5)
WBC Count: 4.8 10*3/uL (ref 3.9–10.3)

## 2017-12-02 MED ORDER — HEPARIN SOD (PORK) LOCK FLUSH 100 UNIT/ML IV SOLN
250.0000 [IU] | Freq: Once | INTRAVENOUS | Status: DC | PRN
  Filled 2017-12-02: qty 5

## 2017-12-02 MED ORDER — HEPARIN SOD (PORK) LOCK FLUSH 100 UNIT/ML IV SOLN
500.0000 [IU] | Freq: Once | INTRAVENOUS | Status: AC | PRN
  Administered 2017-12-02: 500 [IU]
  Filled 2017-12-02: qty 5

## 2017-12-02 MED ORDER — SODIUM CHLORIDE 0.9 % IV SOLN
65.0000 mg/m2 | Freq: Once | INTRAVENOUS | Status: AC
  Administered 2017-12-02: 120 mg via INTRAVENOUS
  Filled 2017-12-02: qty 20

## 2017-12-02 MED ORDER — SODIUM CHLORIDE 0.9% FLUSH
3.0000 mL | INTRAVENOUS | Status: DC | PRN
  Filled 2017-12-02: qty 10

## 2017-12-02 MED ORDER — SODIUM CHLORIDE 0.9% FLUSH
10.0000 mL | INTRAVENOUS | Status: DC | PRN
  Administered 2017-12-02: 10 mL
  Filled 2017-12-02: qty 10

## 2017-12-02 MED ORDER — DIPHENHYDRAMINE HCL 25 MG PO CAPS
25.0000 mg | ORAL_CAPSULE | Freq: Once | ORAL | Status: AC
  Administered 2017-12-02: 25 mg via ORAL

## 2017-12-02 MED ORDER — DIPHENHYDRAMINE HCL 25 MG PO CAPS
ORAL_CAPSULE | ORAL | Status: AC
  Filled 2017-12-02: qty 1

## 2017-12-02 MED ORDER — SODIUM CHLORIDE 0.9 % IV SOLN
Freq: Once | INTRAVENOUS | Status: AC
  Administered 2017-12-02: 10:00:00 via INTRAVENOUS
  Filled 2017-12-02: qty 250

## 2017-12-02 MED ORDER — FAMOTIDINE IN NACL 20-0.9 MG/50ML-% IV SOLN
INTRAVENOUS | Status: AC
  Filled 2017-12-02: qty 100

## 2017-12-02 MED ORDER — SULFAMETHOXAZOLE-TRIMETHOPRIM 800-160 MG PO TABS: 1.0000 | ORAL_TABLET | Freq: Two times a day (BID) | ORAL | 0 refills | Status: DC

## 2017-12-02 MED ORDER — FAMOTIDINE IN NACL 20-0.9 MG/50ML-% IV SOLN
40.0000 mg | Freq: Once | INTRAVENOUS | Status: AC
  Administered 2017-12-02: 40 mg via INTRAVENOUS

## 2017-12-02 MED ORDER — ALTEPLASE 2 MG IJ SOLR
2.0000 mg | Freq: Once | INTRAMUSCULAR | Status: DC | PRN
  Filled 2017-12-02: qty 2

## 2017-12-02 NOTE — Addendum Note (Signed)
Addended by: Thelma Barge MAY J on: 12/02/2017 12:35 PM   Modules accepted: Orders

## 2017-12-02 NOTE — Addendum Note (Signed)
Addended by: Thelma Barge MAY J on: 12/02/2017 10:03 AM   Modules accepted: Orders

## 2017-12-02 NOTE — Assessment & Plan Note (Signed)
06/05/2017:Bilateral mastectomies: Right mastectomy: IDC grade 1, 3 foci 1.2 cm, 1.2 cm, 1 cm, IgA DCIS, LVH diffuse present, margins negative, 4/7 lymph nodes positive with extracapsular extension, ER 95%, PR 95%, HER-2 negative, Ki-67 2% to 10%, T1 cN2 aM0 stage II a pathological stage; left mastectomy: Benign  Mammaprint done preoperatively: Low risk Axillary lymph node dissection 06/26/2017: 2/22 lymph nodes positive(making a total of 6 lymph nodes that were positive)  Treatment plan: 1.Because ofher high risk features (6 LN Pos),werecommended adjuvant systemic chemotherapywithdose dense Adriamycin and Cytoxan x4 followed by Taxol weekly x12 started 07/22/2017 3.Followed by radiation.  4.Followed by adjuvant antiestrogen therapy  UPBEAT clinical trial ([id number]):No toxicities related to the trial --------------------------------------------------------------------------- Current treatment: Completed 4 cycles ofdose dense Adriamycin and Cytoxan, today cycle11Taxol Labs reviewed Echocardiogram 06/20/2017: EF 55 to 60%  Taxol toxicities: Neuropathy grades 1-2 Slight bruising of the fingernails Monitoring her white blood cell count very closely. Depression: On Effexor to 75 mg daily. Patient is contemplating on reconstruction to make sure that the extra skin tissue on the left chest wall can be removed.  I will make arrangements for her to see radiation oncology. Monitoring closely for chemo toxicities.  

## 2017-12-02 NOTE — Progress Notes (Signed)
Patient Care Team: Tisovec, Fransico Him, MD as PCP - General (Internal Medicine)  DIAGNOSIS:  Encounter Diagnosis  Name Primary?  . Breast cancer metastasized to axillary lymph node, right ([place])     SUMMARY OF ONCOLOGIC HISTORY:   Malignant neoplasm of axillary tail of right breast in female, estrogen receptor positive ([place])   05/09/2017 Initial Diagnosis    May 2018: Palpable right axillary mass.  The recommended a 6-week follow-up but apparently the mass decreased in size.  Recent increase in the mass was noted, biopsy revealed Grade 1 IDC with DCIS, lymphovascular invasion present, 4 small hypoechoic masses largest 0.9 cm, T1BN1 stage Ib clinical stage    06/05/2017 Surgery    Bilateral mastectomies: Right mastectomy: IDC grade 1, 3 foci 1.2 cm, 1.2 cm, 1 cm, IgA DCIS, LVH diffuse present, margins negative, 4/7 lymph nodes positive with extracapsular extension, ER 95%, PR 95%, HER-2 negative, Ki-67 2% to 10%, T1 cN2 aM0 stage II a pathological stage; left mastectomy: Benign    06/26/2017 Surgery    Axillary lymph node dissection: 2/22 lymph nodes positive (making a total of 6+ lymph nodes)    07/22/2017 -  Chemotherapy    Adjuvant chemotherapy with dose dense Adriamycin and Cytoxan x4 followed by Taxol weekly x12      Breast cancer metastasized to axillary lymph node, right ([place])   06/05/2017 Initial Diagnosis    Breast cancer metastasized to axillary lymph node, right ([place])    07/22/2017 -  Chemotherapy    Adjuvant chemotherapy with dose dense Adriamycin and Cytoxan x4 followed by Taxol weekly x12      CHIEF COMPLIANT: Taxol cycle 11  INTERVAL HISTORY: Stephanie Holden is a 89-year with above-mentioned history of right breast cancer who is currently on Taxol and today is cycle 11 of treatment.  She reports nail changes but denies neuropathy of significance.  She has very mild change in sensation of the tips of her fingers and toes.  Her biggest issue is a fatigue which is been  cumulative over time.  She is more tearful and emotional because of lack of energy.  Lack of energy is not because of anemia her hemoglobin has improved up to 11 g.  REVIEW OF SYSTEMS:   Constitutional: Denies fevers, chills or abnormal weight loss Eyes: Denies blurriness of vision Ears, nose, mouth, throat, and face: Denies mucositis or sore throat Respiratory: Denies cough, dyspnea or wheezes Cardiovascular: Denies palpitation, chest discomfort Gastrointestinal:  Denies nausea, heartburn or change in bowel habits Skin: Denies abnormal skin rashes Lymphatics: Denies new lymphadenopathy or easy bruising Neurological:Denies numbness, tingling or new weaknesses Behavioral/Psych: Mood is stable, no new changes  Extremities: No lower extremity edema  All other systems were reviewed with the patient and are negative.  I have reviewed the past medical history, past surgical history, social history and family history with the patient and they are unchanged from previous note.  ALLERGIES:  is allergic to dilaudid [hydromorphone hcl].  MEDICATIONS:  Current Outpatient Medications  Medication Sig Dispense Refill  . acetaminophen (TYLENOL) 500 MG tablet Take 1,000 mg by mouth every 6 (six) hours as needed for moderate pain or headache.    . insulin aspart (NOVOLOG) 100 UNIT/ML injection Inject 45 Units into the skin See admin instructions. Use via insulin pump up to 45 units daily    . lidocaine-prilocaine (EMLA) cream Apply to affected area once 30 g 3  . LORazepam (ATIVAN) 0.5 MG tablet Take 1 tablet (0.5 mg total)  by mouth every 6 (six) hours as needed for anxiety or sleep. 30 tablet 3  . ondansetron (ZOFRAN) 8 MG tablet Take 1 tablet (8 mg total) by mouth 2 (two) times daily as needed. Start on the third day after chemotherapy. (Patient not taking: Reported on 10/09/2017) 30 tablet 1  . oxyCODONE-acetaminophen (PERCOCET/ROXICET) 5-325 MG tablet Take 1 tablet by mouth every 8 (eight) hours as  needed for severe pain. 60 tablet 0  . Probiotic Product (PROBIOTIC DAILY PO) Take 1 capsule by mouth at bedtime.     Marland Kitchen venlafaxine XR (EFFEXOR-XR) 75 MG 24 hr capsule Take 1 capsule (75 mg total) by mouth daily with breakfast. 30 capsule 3   No current facility-administered medications for this visit.    Facility-Administered Medications Ordered in Other Visits  Medication Dose Route Frequency Provider Last Rate Last Dose  . 0.9 %  sodium chloride infusion   Intravenous Once Nicholas Lose, MD      . alteplase (CATHFLO ACTIVASE) injection 2 mg  2 mg Intracatheter Once PRN Nicholas Lose, MD      . diphenhydrAMINE (BENADRYL) capsule 25 mg  25 mg Oral Once Nicholas Lose, MD      . famotidine (PEPCID) IVPB 20 mg premix  40 mg Intravenous Once Nicholas Lose, MD      . heparin lock flush 100 unit/mL  500 Units Intracatheter Once PRN Nicholas Lose, MD      . heparin lock flush 100 unit/mL  250 Units Intracatheter Once PRN Nicholas Lose, MD      . PACLitaxel (TAXOL) 120 mg in sodium chloride 0.9 % 250 mL chemo infusion (</= 2m/m2)  65 mg/m2 (Treatment Plan Recorded) Intravenous Once GNicholas Lose MD      . sodium chloride flush (NS) 0.9 % injection 10 mL  10 mL Intracatheter PRN GNicholas Lose MD      . sodium chloride flush (NS) 0.9 % injection 3 mL  3 mL Intracatheter PRN GNicholas Lose MD        PHYSICAL EXAMINATION: ECOG PERFORMANCE STATUS: 1 - Symptomatic but completely ambulatory  Vitals:   12/02/17 0859  BP: (!) 142/89  Pulse: (!) 102  Resp: 20  Temp: 97.9 F (36.6 C)  SpO2: 100%   Filed Weights   12/02/17 0859  Weight: 169 lb (76.7 kg)    GENERAL:alert, no distress and comfortable SKIN: skin color, texture, turgor are normal, no rashes or significant lesions EYES: normal, Conjunctiva are pink and non-injected, sclera clear OROPHARYNX:no exudate, no erythema and lips, buccal mucosa, and tongue normal  NECK: supple, thyroid normal size, non-tender, without nodularity LYMPH:   no palpable lymphadenopathy in the cervical, axillary or inguinal LUNGS: clear to auscultation and percussion with normal breathing effort HEART: regular rate & rhythm and no murmurs and no lower extremity edema ABDOMEN:abdomen soft, non-tender and normal bowel sounds MUSCULOSKELETAL:no cyanosis of digits and no clubbing  NEURO: alert & oriented x 3 with fluent speech, no focal motor/sensory deficits EXTREMITIES: No lower extremity edema   LABORATORY DATA:  I have reviewed the data as listed CMP Latest Ref Rng & Units 12/02/2017 11/25/2017 11/18/2017  Glucose 70 - 99 mg/dL 252(H) 163(H) 206(H)  BUN 6 - 20 mg/dL '9 16 10  ' Creatinine 0.44 - 1.00 mg/dL 0.93 0.84 0.86  Sodium 135 - 145 mmol/L 141 141 139  Potassium 3.5 - 5.1 mmol/L 4.5 4.2 4.7  Chloride 98 - 111 mmol/L 104 104 103  CO2 22 - 32 mmol/L '27 29 29  ' Calcium  8.9 - 10.3 mg/dL 9.2 9.5 9.4  Total Protein 6.5 - 8.1 g/dL 7.0 7.2 7.4  Total Bilirubin 0.3 - 1.2 mg/dL 0.5 0.5 0.4  Alkaline Phos 38 - 126 U/L 84 86 86  AST 15 - 41 U/L 24 27 34  ALT 0 - 44 U/L 25 28 36    Lab Results  Component Value Date   WBC 4.8 12/02/2017   HGB 11.0 (L) 12/02/2017   HCT 33.6 (L) 12/02/2017   MCV 92.4 12/02/2017   PLT 378 12/02/2017   NEUTROABS 3.4 12/02/2017    ASSESSMENT & PLAN:  Breast cancer metastasized to axillary lymph node, right ([facility]) 06/05/2017:Bilateral mastectomies: Right mastectomy: IDC grade 1, 3 foci 1.2 cm, 1.2 cm, 1 cm, IgA DCIS, LVH diffuse present, margins negative, 4/7 lymph nodes positive with extracapsular extension, ER 95%, PR 95%, HER-2 negative, Ki-67 2% to 10%, T1 cN2 aM0 stage II a pathological stage; left mastectomy: Benign  Mammaprint done preoperatively: Low risk Axillary lymph node dissection 06/26/2017: 2/22 lymph nodes positive(making a total of 6 lymph nodes that were positive)  Treatment plan: 1.Because ofher high risk features (6 LN Pos),werecommended adjuvant systemic chemotherapywithdose dense  Adriamycin and Cytoxan x4 followed by Taxol weekly x12 started 07/22/2017 3.Followed by radiation.  4.Followed by adjuvant antiestrogen therapy  UPBEAT clinical trial ([id number]):No toxicities related to the trial --------------------------------------------------------------------------- Current treatment: Completed 4 cycles ofdose dense Adriamycin and Cytoxan, today cycle11Taxol Labs reviewed Echocardiogram 06/20/2017: EF 55 to 60%  Taxol toxicities: Neuropathy grades 1-2 Slight bruising of the fingernails Monitoring her white blood cell count very closely. Depression: On Effexor to 75 mg daily. Patient is contemplating on reconstruction to make sure that the extra skin tissue on the left chest wall can be removed.  I will make arrangements for her to see radiation oncology. Monitoring closely for chemo toxicities.     No orders of the defined types were placed in this encounter.  The patient has a good understanding of the overall plan. she agrees with it. she will call with any problems that may develop before the next visit here.   Harriette Ohara, MD 12/02/17

## 2017-12-02 NOTE — Telephone Encounter (Signed)
Pt preliminary results for her UA came back with moderate leukocytes. Per Dr.GUdena, ok to send prophylactic antibiotic (Bactrim PO). Pt encouraged to hydrate with plenty of fluids and to report any worsening of symptoms. Pt verbalized understanding.Sent prescription to [facility] [address]

## 2017-12-03 ENCOUNTER — Encounter: Payer: Self-pay | Admitting: Radiation Oncology

## 2017-12-04 LAB — URINE CULTURE: Culture: 90000 — AB

## 2017-12-08 NOTE — Assessment & Plan Note (Signed)
06/05/2017:Bilateral mastectomies: Right mastectomy: IDC grade 1, 3 foci 1.2 cm, 1.2 cm, 1 cm, IgA DCIS, LVH diffuse present, margins negative, 4/7 lymph nodes positive with extracapsular extension, ER 95%, PR 95%, HER-2 negative, Ki-67 2% to 10%, T1 cN2 aM0 stage II a pathological stage; left mastectomy: Benign  Mammaprint done preoperatively: Low risk Axillary lymph node dissection 06/26/2017: 2/22 lymph nodes positive(making a total of 6 lymph nodes that were positive)  Treatment plan: 1.Because ofher high risk features (6 LN Pos),werecommended adjuvant systemic chemotherapywithdose dense Adriamycin and Cytoxan x4 followed by Taxol weekly x12 started 07/22/2017 3.Followed by radiation.  4.Followed by adjuvant antiestrogen therapy  UPBEAT clinical trial ([id number]):No toxicities related to the trial --------------------------------------------------------------------------- Current treatment: Completed 4 cycles ofdose dense Adriamycin and Cytoxan, today cycle12Taxol Labs reviewed Echocardiogram 06/20/2017: EF 55 to 60%  Taxol toxicities:Neuropathy grades 1-2 Slight bruising of the fingernails Monitoring her white blood cell count very closely. Depression: On Effexor to 75 mg daily. Patient is contemplating on reconstruction to make sure that the extra skin tissue on the left chest wall can be removed.  Patient has appts with radiation oncology. Monitoring closely for chemo toxicities.

## 2017-12-09 ENCOUNTER — Inpatient Hospital Stay (HOSPITAL_BASED_OUTPATIENT_CLINIC_OR_DEPARTMENT_OTHER): Payer: BLUE CROSS/BLUE SHIELD | Admitting: Hematology and Oncology

## 2017-12-09 ENCOUNTER — Telehealth: Payer: Self-pay | Admitting: Hematology and Oncology

## 2017-12-09 ENCOUNTER — Inpatient Hospital Stay: Payer: BLUE CROSS/BLUE SHIELD

## 2017-12-09 ENCOUNTER — Encounter: Payer: Self-pay | Admitting: *Deleted

## 2017-12-09 DIAGNOSIS — C50611 Malignant neoplasm of axillary tail of right female breast: Secondary | ICD-10-CM

## 2017-12-09 DIAGNOSIS — C50911 Malignant neoplasm of unspecified site of right female breast: Secondary | ICD-10-CM

## 2017-12-09 DIAGNOSIS — Z170 Estrogen receptor positive status [ER+]: Secondary | ICD-10-CM

## 2017-12-09 DIAGNOSIS — C773 Secondary and unspecified malignant neoplasm of axilla and upper limb lymph nodes: Principal | ICD-10-CM

## 2017-12-09 DIAGNOSIS — F329 Major depressive disorder, single episode, unspecified: Secondary | ICD-10-CM

## 2017-12-09 DIAGNOSIS — R5383 Other fatigue: Secondary | ICD-10-CM | POA: Diagnosis not present

## 2017-12-09 DIAGNOSIS — R202 Paresthesia of skin: Secondary | ICD-10-CM | POA: Diagnosis not present

## 2017-12-09 DIAGNOSIS — Z5111 Encounter for antineoplastic chemotherapy: Secondary | ICD-10-CM | POA: Diagnosis not present

## 2017-12-09 LAB — CMP (CANCER CENTER ONLY)
ALBUMIN: 3.8 g/dL (ref 3.5–5.0)
ALK PHOS: 76 U/L (ref 38–126)
ALT: 20 U/L (ref 0–44)
AST: 24 U/L (ref 15–41)
Anion gap: 10 (ref 5–15)
BUN: 10 mg/dL (ref 6–20)
CALCIUM: 9.4 mg/dL (ref 8.9–10.3)
CO2: 26 mmol/L (ref 22–32)
Chloride: 105 mmol/L (ref 98–111)
Creatinine: 0.9 mg/dL (ref 0.44–1.00)
GFR, Est AFR Am: >60 mL/min (ref >60)
GFR, Estimated: >60 mL/min (ref >60)
GLUCOSE: 173 mg/dL — AB (ref 70–99)
POTASSIUM: 4.4 mmol/L (ref 3.5–5.1)
SODIUM: 141 mmol/L (ref 135–145)
Total Bilirubin: 0.5 mg/dL (ref 0.3–1.2)
Total Protein: 7 g/dL (ref 6.5–8.1)

## 2017-12-09 LAB — CBC WITH DIFFERENTIAL (CANCER CENTER ONLY)
Basophils Absolute: 0 10*3/uL (ref 0.0–0.1)
Basophils Relative: 1 %
EOS PCT: 1 %
Eosinophils Absolute: 0 10*3/uL (ref 0.0–0.5)
HCT: 33.4 % — ABNORMAL LOW (ref 34.8–46.6)
Hemoglobin: 10.7 g/dL — ABNORMAL LOW (ref 11.6–15.9)
LYMPHS ABS: 0.9 10*3/uL (ref 0.9–3.3)
LYMPHS PCT: 35 %
MCH: 29.6 pg (ref 25.1–34.0)
MCHC: 32 g/dL (ref 31.5–36.0)
MCV: 92.5 fL (ref 79.5–101.0)
MONO ABS: 0.1 10*3/uL (ref 0.1–0.9)
Monocytes Relative: 5 %
Neutro Abs: 1.6 10*3/uL (ref 1.5–6.5)
Neutrophils Relative %: 58 %
PLATELETS: 358 10*3/uL (ref 145–400)
RBC: 3.61 MIL/uL — AB (ref 3.70–5.45)
RDW: 18.9 % — ABNORMAL HIGH (ref 11.2–14.5)
WBC Count: 2.7 10*3/uL — ABNORMAL LOW (ref 3.9–10.3)

## 2017-12-09 MED ORDER — SODIUM CHLORIDE 0.9% FLUSH
10.0000 mL | INTRAVENOUS | Status: DC | PRN
  Administered 2017-12-09: 10 mL
  Filled 2017-12-09: qty 10

## 2017-12-09 MED ORDER — DIPHENHYDRAMINE HCL 25 MG PO CAPS
25.0000 mg | ORAL_CAPSULE | Freq: Once | ORAL | Status: AC
  Administered 2017-12-09: 25 mg via ORAL

## 2017-12-09 MED ORDER — FAMOTIDINE IN NACL 20-0.9 MG/50ML-% IV SOLN
INTRAVENOUS | Status: AC
  Filled 2017-12-09: qty 50

## 2017-12-09 MED ORDER — HEPARIN SOD (PORK) LOCK FLUSH 100 UNIT/ML IV SOLN
500.0000 [IU] | Freq: Once | INTRAVENOUS | Status: AC | PRN
  Administered 2017-12-09: 500 [IU]
  Filled 2017-12-09: qty 5

## 2017-12-09 MED ORDER — SODIUM CHLORIDE 0.9 % IV SOLN
Freq: Once | INTRAVENOUS | Status: AC
  Administered 2017-12-09: 09:00:00 via INTRAVENOUS
  Filled 2017-12-09: qty 250

## 2017-12-09 MED ORDER — DIPHENHYDRAMINE HCL 25 MG PO CAPS
ORAL_CAPSULE | ORAL | Status: AC
  Filled 2017-12-09: qty 1

## 2017-12-09 MED ORDER — FAMOTIDINE IN NACL 20-0.9 MG/50ML-% IV SOLN
40.0000 mg | Freq: Once | INTRAVENOUS | Status: AC
  Administered 2017-12-09: 40 mg via INTRAVENOUS

## 2017-12-09 MED ORDER — SODIUM CHLORIDE 0.9 % IV SOLN
65.0000 mg/m2 | Freq: Once | INTRAVENOUS | Status: AC
  Administered 2017-12-09: 120 mg via INTRAVENOUS
  Filled 2017-12-09: qty 20

## 2017-12-09 NOTE — Progress Notes (Signed)
Patient Care Team: Tisovec, Fransico Him, MD as PCP - General (Internal Medicine)  DIAGNOSIS:  Encounter Diagnosis  Name Primary?  . Malignant neoplasm of axillary tail of right breast in female, estrogen receptor positive ([place])     SUMMARY OF ONCOLOGIC HISTORY:   Malignant neoplasm of axillary tail of right breast in female, estrogen receptor positive ([place])   05/09/2017 Initial Diagnosis    May 2018: Palpable right axillary mass.  The recommended a 6-week follow-up but apparently the mass decreased in size.  Recent increase in the mass was noted, biopsy revealed Grade 1 IDC with DCIS, lymphovascular invasion present, 4 small hypoechoic masses largest 0.9 cm, T1BN1 stage Ib clinical stage    06/05/2017 Surgery    Bilateral mastectomies: Right mastectomy: IDC grade 1, 3 foci 1.2 cm, 1.2 cm, 1 cm, IgA DCIS, LVH diffuse present, margins negative, 4/7 lymph nodes positive with extracapsular extension, ER 95%, PR 95%, HER-2 negative, Ki-67 2% to 10%, T1 cN2 aM0 stage II a pathological stage; left mastectomy: Benign    06/26/2017 Surgery    Axillary lymph node dissection: 2/22 lymph nodes positive (making a total of 6+ lymph nodes)    07/22/2017 -  Chemotherapy    Adjuvant chemotherapy with dose dense Adriamycin and Cytoxan x4 followed by Taxol weekly x12      Breast cancer metastasized to axillary lymph node, right ([place])   06/05/2017 Initial Diagnosis    Breast cancer metastasized to axillary lymph node, right ([place])    07/22/2017 -  Chemotherapy    Adjuvant chemotherapy with dose dense Adriamycin and Cytoxan x4 followed by Taxol weekly x12      CHIEF COMPLIANT: Cycle 12 Taxol  INTERVAL HISTORY: Stephanie Holden is a 42 year old with above-mentioned history of right breast cancer currently on adjuvant chemotherapy and today is the last and final cycle of chemotherapy.  She continues to have severe fatigue as well as skin and nail changes with some tingling and numbness of the tips of the  fingers.  She tells me it is not any worse than before.  Denies any nausea or vomiting.  She is very emotional given the fact that she has finally completed chemotherapy.  REVIEW OF SYSTEMS:   Constitutional: Denies fevers, chills or abnormal weight loss Eyes: Denies blurriness of vision Ears, nose, mouth, throat, and face: Denies mucositis or sore throat Respiratory: Denies cough, dyspnea or wheezes Cardiovascular: Denies palpitation, chest discomfort Gastrointestinal:  Denies nausea, heartburn or change in bowel habits Skin: Denies abnormal skin rashes Lymphatics: Denies new lymphadenopathy or easy bruising Neurological: Neuropathy Behavioral/Psych: Extremely emotional Extremities: No lower extremity edema, nail changes as well as neuropathy   All other systems were reviewed with the patient and are negative.  I have reviewed the past medical history, past surgical history, social history and family history with the patient and they are unchanged from previous note.  ALLERGIES:  is allergic to dilaudid [hydromorphone hcl].  MEDICATIONS:  Current Outpatient Medications  Medication Sig Dispense Refill  . acetaminophen (TYLENOL) 500 MG tablet Take 1,000 mg by mouth every 6 (six) hours as needed for moderate pain or headache.    . insulin aspart (NOVOLOG) 100 UNIT/ML injection Inject 45 Units into the skin See admin instructions. Use via insulin pump up to 45 units daily    . lidocaine-prilocaine (EMLA) cream Apply to affected area once 30 g 3  . LORazepam (ATIVAN) 0.5 MG tablet Take 1 tablet (0.5 mg total) by mouth every 6 (six) hours as  needed for anxiety or sleep. 30 tablet 3  . ondansetron (ZOFRAN) 8 MG tablet Take 1 tablet (8 mg total) by mouth 2 (two) times daily as needed. Start on the third day after chemotherapy. (Patient not taking: Reported on 10/09/2017) 30 tablet 1  . oxyCODONE-acetaminophen (PERCOCET/ROXICET) 5-325 MG tablet Take 1 tablet by mouth every 8 (eight) hours as  needed for severe pain. 60 tablet 0  . Probiotic Product (PROBIOTIC DAILY PO) Take 1 capsule by mouth at bedtime.     . sulfamethoxazole-trimethoprim (BACTRIM DS,SEPTRA DS) 800-160 MG tablet Take 1 tablet by mouth 2 (two) times daily. 14 tablet 0  . venlafaxine XR (EFFEXOR-XR) 75 MG 24 hr capsule Take 1 capsule (75 mg total) by mouth daily with breakfast. 30 capsule 3   No current facility-administered medications for this visit.     PHYSICAL EXAMINATION: ECOG PERFORMANCE STATUS: 1 - Symptomatic but completely ambulatory  Vitals:   12/09/17 0845  BP: (!) 158/94  Pulse: (!) 112  Resp: 18  Temp: 98.5 F (36.9 C)  SpO2: 98%   Filed Weights   12/09/17 0845  Weight: 168 lb 3.2 oz (76.3 kg)    GENERAL:alert, no distress and comfortable SKIN: skin color, texture, turgor are normal, no rashes or significant lesions EYES: normal, Conjunctiva are pink and non-injected, sclera clear OROPHARYNX:no exudate, no erythema and lips, buccal mucosa, and tongue normal  NECK: supple, thyroid normal size, non-tender, without nodularity LYMPH:  no palpable lymphadenopathy in the cervical, axillary or inguinal LUNGS: clear to auscultation and percussion with normal breathing effort HEART: regular rate & rhythm and no murmurs and no lower extremity edema ABDOMEN:abdomen soft, non-tender and normal bowel sounds MUSCULOSKELETAL:no cyanosis of digits and no clubbing  NEURO: Peripheral neuropathy EXTREMITIES: No lower extremity edema   LABORATORY DATA:  I have reviewed the data as listed CMP Latest Ref Rng & Units 12/02/2017 11/25/2017 11/18/2017  Glucose 70 - 99 mg/dL 252(H) 163(H) 206(H)  BUN 6 - 20 mg/dL _0 Creatinine 0.44 - 1.00 mg/dL 0.93 0.84 0.86  Sodium 135 - 145 mmol/L 141 141 139  Potassium 3.5 - 5.1 mmol/L 4.5 4.2 4.7  Chloride 98 - 111 mmol/L 104 104 103  CO2 22 - 32 mmol/L _1 Calcium 8.9 - 10.3 mg/dL 9.2 9.5 9.4  Total Protein 6.5 - 8.1 g/dL 7.0 7.2 7.4  Total Bilirubin 0.3  - 1.2 mg/dL 0.5 0.5 0.4  Alkaline Phos 38 - 126 U/L 84 86 86  AST 15 - 41 U/L 24 27 34  ALT 0 - 44 U/L 25 28 36    Lab Results  Component Value Date   WBC 2.7 (L) 12/09/2017   HGB 10.7 (L) 12/09/2017   HCT 33.4 (L) 12/09/2017   MCV 92.5 12/09/2017   PLT 358 12/09/2017   NEUTROABS 1.6 12/09/2017    ASSESSMENT & PLAN:  Malignant neoplasm of axillary tail of right breast in female, estrogen receptor positive ([place]) 06/05/2017:Bilateral mastectomies: Right mastectomy: IDC grade 1, 3 foci 1.2 cm, 1.2 cm, 1 cm, IgA DCIS, LVH diffuse present, margins negative, 4/7 lymph nodes positive with extracapsular extension, ER 95%, PR 95%, HER-2 negative, Ki-67 2% to 10%, T1 cN2 aM0 stage II a pathological stage; left mastectomy: Benign  Mammaprint done preoperatively: Low risk Axillary lymph node dissection 06/26/2017: 2/22 lymph nodes positive(making a total of 6 lymph nodes that were positive)  Treatment plan: 1.Because ofher high risk features (6 LN Pos),werecommended adjuvant systemic chemotherapywithdose dense Adriamycin and  Cytoxan x4 followed by Taxol weekly x12 started 07/22/2017 3.Followed by radiation.  4.Followed by adjuvant antiestrogen therapy  UPBEAT clinical trial ([id number]):No toxicities related to the trial --------------------------------------------------------------------------- Current treatment: Completed 4 cycles ofdose dense Adriamycin and Cytoxan, today cycle12Taxol Labs reviewed Echocardiogram 06/20/2017: EF 55 to 60%  Taxol toxicities:Neuropathy grades 1-2 Slight bruising of the fingernails Monitoring her white blood cell count very closely. Depression: On Effexor to 75 mg daily. Patient is contemplating on reconstruction to make sure that the extra skin tissue on the left chest wall can be removed.  Patient has appts with radiation oncology. Monitoring closely for chemo toxicities. Return to make at the end of radiation to begin antiestrogen  therapy   No orders of the defined types were placed in this encounter.  The patient has a good understanding of the overall plan. she agrees with it. she will call with any problems that may develop before the next visit here.   Harriette Ohara, MD 12/09/17

## 2017-12-09 NOTE — Telephone Encounter (Signed)
Per 9/17 no los °

## 2017-12-09 NOTE — Patient Instructions (Signed)
[facility] Discharge Instructions for Patients Receiving Chemotherapy  Today you received the following chemotherapy agents: Paclitaxel (Taxol)  To help prevent nausea and vomiting after your treatment, we encourage you to take your nausea medication as prescribed. If you develop nausea and vomiting that is not controlled by your nausea medication, call the clinic.   BELOW ARE SYMPTOMS THAT SHOULD BE REPORTED IMMEDIATELY:  *FEVER GREATER THAN 100.5 F  *CHILLS WITH OR WITHOUT FEVER  NAUSEA AND VOMITING THAT IS NOT CONTROLLED WITH YOUR NAUSEA MEDICATION  *UNUSUAL SHORTNESS OF BREATH  *UNUSUAL BRUISING OR BLEEDING  TENDERNESS IN MOUTH AND THROAT WITH OR WITHOUT PRESENCE OF ULCERS  *URINARY PROBLEMS  *BOWEL PROBLEMS  UNUSUAL RASH Items with * indicate a potential emergency and should be followed up as soon as possible.  Feel free to call the clinic should you have any questions or concerns. The clinic phone number is [phone].  Please show the CHEMO ALERT CARD at check-in to the Emergency Department and triage nurse.   

## 2017-12-10 DIAGNOSIS — Z794 Long term (current) use of insulin: Secondary | ICD-10-CM | POA: Diagnosis not present

## 2017-12-10 DIAGNOSIS — E109 Type 1 diabetes mellitus without complications: Secondary | ICD-10-CM | POA: Diagnosis not present

## 2017-12-15 NOTE — Progress Notes (Signed)
Location of Breast Cancer: Right Breast  Histology per Pathology Report:  05/09/17 Diagnosis 1. Breast, right, needle core biopsy, 9:30 to 10:00 6 cmfn - INVASIVE DUCTAL CARCINOMA. - DUCTAL CARCINOMA IN SITU. - LYMPHOVASCULAR INVASION IS IDENTIFIED. - SEE COMMENT. 2. Breast, right, needle core biopsy, 9:30 to 10:00 8 cmfn - INVASIVE DUCTAL CARCINOMA. - DUCTAL CARCINOMA IN SITU. - LYMPHOVASCULAR INVASION IS IDENTIFIED.  Receptor Status: ER(95%), PR (95%), Her2-neu (NEG), Ki-(2%)  06/05/17 Diagnosis 1. Breast, simple mastectomy, Left - FIBROCYSTIC CHANGES. - THERE IS NO EVIDENCE OF MALIGNANCY. 2. Breast, simple mastectomy, Right - INVASIVE DUCTAL CARCINOMA, GRADE I/III, THREE FOCI SPANNING 1.2 CM, 1.2 CM, AND 1.0 CM. - DUCTAL CARCINOMA IN SITU, INTERMEDIATE GRADE. - LYMPHOVASCULAR INVASION IS IDENTIFIED, DIFFUSE. - THE SURGICAL RESECTION MARGINS ARE NEGATIVE FOR CARCINOMA. - SEE ONCOLOGY TABLE BELOW. 3. Lymph node, sentinel, biopsy, Right axillary - METASTATIC CARCINOMA IN 1 OF 1 LYMPH NODE (1/1). 4. Lymph node, sentinel, biopsy, Right axillary - METASTATIC CARCINOMA IN 1 OF 1 LYMPH NODE (1/1) WITH EXTRACAPSULAR EXTENSION. 5. Lymph node, sentinel, biopsy, Right axillary - THERE IS NO EVIDENCE OF CARCINOMA IN 1 OF 1 LYMPH NODE (0/1). 6. Lymph node, sentinel, biopsy, Right axillary - METASTATIC CARCINOMA IN 1 OF 1 LYMPH NODE (1/1), WITH EXTRACAPSULAR EXTENSION. 7. Lymph node, sentinel, biopsy, Right axillary - METASTATIC CARCINOMA IN 1 OF 1 LYMPH NODE (1/1). 8. Lymph node, sentinel, biopsy, Right axillary - THERE IS NO EVIDENCE OF CARCINOMA IN 1 OF 1 LYMPH NODE (0/1). 9. Lymph node, sentinel, biopsy, Right axillary - THERE IS NO EVIDENCE OF CARCINOMA IN 1 OF 1 LYMPH NODE (0/1).  06/26/17 Diagnosis Lymph nodes, regional resection, right axillary - METASTATIC CARCINOMA IN 2 OF 22 LYMPH NODES (2/22).  Did patient present with symptoms or was this found on screening mammography?:  She presented with a palpable right axillary lymph node and mammogram/us negative for cancer.  MRI identified multiple foci of cancer on the right.  Past/Anticipated interventions by surgeon, if any: 06/05/17 Dr. Barry Holden Bilateral Mastectomy with Right Sentinel Node Mapping and biopsy  06/26/17 PROCEDURE: Left subclavian port placement, Bard ClearVue Power Port, MRI safe, 8-French, right axillary lymph node dissection.   SURGEON:  Stephanie Klein, MD   Past/Anticipated interventions by medical oncology, if any:  12/09/17 Dr. Lindi Holden Current treatment: Completed 4 cycles ofdose dense Adriamycin and Cytoxan, today cycle12Taxol Labs reviewed Echocardiogram 06/20/2017: EF 55 to 60% Taxol toxicities:Neuropathy grades 1-2 Slight bruising of the fingernails Monitoring her white blood cell count very closely. Depression:OnEffexor to 75 mg daily. Patient is contemplating on reconstruction to make sure that the extra skin tissue on the left chest wall can be removed.  Patient has appts with radiation oncology. Monitoring closely for chemo toxicities. Return to make at the end of radiation to begin antiestrogen therapy   Lymphedema issues, if any:  Yes, she is wearing a compression sleeve today. She reports good arm mobility.   Pain issues, if any:  She reports pain to her joints and knees.   SAFETY ISSUES:  Prior radiation? No  Pacemaker/ICD? No  Possible current pregnancy? She has had a tubal ligation.   Is the patient on methotrexate? No  Current Complaints / other details:    BP (!) 152/86 (BP Location: Left Arm, Patient Position: Sitting)   Pulse 89   Temp 98.2 F (36.8 C) (Oral)   Resp 20   Ht '5\' 2"'  (1.575 m)   Wt 170 lb 9.6 oz (77.4 kg)   SpO2 99%  BMI 31.20 kg/m    Wt Readings from Last 3 Encounters:  12/22/17 170 lb 9.6 oz (77.4 kg)  12/09/17 168 lb 3.2 oz (76.3 kg)  12/02/17 169 lb (76.7 kg)      Stephanie Holden, Stephanie Police, RN 12/15/2017,2:32 PM

## 2017-12-22 ENCOUNTER — Other Ambulatory Visit: Payer: Self-pay

## 2017-12-22 ENCOUNTER — Encounter: Payer: Self-pay | Admitting: Radiation Oncology

## 2017-12-22 ENCOUNTER — Ambulatory Visit
Admission: RE | Admit: 2017-12-22 | Discharge: 2017-12-22 | Disposition: A | Discharge status: Home / Self Care | Payer: BLUE CROSS/BLUE SHIELD | Source: Ambulatory Visit | Attending: Radiation Oncology | Admitting: Radiation Oncology

## 2017-12-22 ENCOUNTER — Other Ambulatory Visit: Payer: Self-pay | Admitting: General Surgery

## 2017-12-22 ENCOUNTER — Other Ambulatory Visit: Payer: Self-pay | Admitting: Hematology and Oncology

## 2017-12-22 VITALS — BP 152/86 | HR 89 | Temp 98.2°F | Resp 20 | Ht 62.0 in | Wt 170.6 lb

## 2017-12-22 DIAGNOSIS — C50611 Malignant neoplasm of axillary tail of right female breast: Secondary | ICD-10-CM | POA: Diagnosis not present

## 2017-12-22 DIAGNOSIS — Z885 Allergy status to narcotic agent status: Secondary | ICD-10-CM | POA: Diagnosis not present

## 2017-12-22 DIAGNOSIS — Z170 Estrogen receptor positive status [ER+]: Secondary | ICD-10-CM

## 2017-12-22 DIAGNOSIS — C50911 Malignant neoplasm of unspecified site of right female breast: Secondary | ICD-10-CM

## 2017-12-22 DIAGNOSIS — Z794 Long term (current) use of insulin: Secondary | ICD-10-CM | POA: Diagnosis not present

## 2017-12-22 DIAGNOSIS — C50919 Malignant neoplasm of unspecified site of unspecified female breast: Secondary | ICD-10-CM | POA: Diagnosis not present

## 2017-12-22 DIAGNOSIS — Z79899 Other long term (current) drug therapy: Secondary | ICD-10-CM | POA: Diagnosis not present

## 2017-12-22 DIAGNOSIS — C773 Secondary and unspecified malignant neoplasm of axilla and upper limb lymph nodes: Secondary | ICD-10-CM | POA: Diagnosis not present

## 2017-12-22 DIAGNOSIS — Z510 Encounter for antineoplastic radiation therapy: Secondary | ICD-10-CM | POA: Diagnosis not present

## 2017-12-22 DIAGNOSIS — Z9013 Acquired absence of bilateral breasts and nipples: Secondary | ICD-10-CM | POA: Diagnosis not present

## 2017-12-22 LAB — PREGNANCY, URINE: Preg Test, Ur: NEGATIVE

## 2017-12-22 MED ORDER — OXYCODONE-ACETAMINOPHEN 5-325 MG PO TABS: 1.0000 | ORAL_TABLET | Freq: Three times a day (TID) | ORAL | 0 refills | Status: DC | PRN

## 2017-12-22 NOTE — Progress Notes (Signed)
Radiation Oncology         [phone] ________________________________  Name: Stephanie Holden MRN: [id number]  Date: 12/22/2017  DOB: [birth year]  Follow-Up Visit Note  Outpatient  CC: Tisovec, Fransico Him, MD  Nicholas Lose, MD  Diagnosis:      ICD-10-CM   1. Malignant neoplasm of axillary tail of right breast in female, estrogen receptor positive ([place]) [place] Ambulatory referral to Social Work   Z17.0 Pregnancy, urine    Ambulatory referral to Social Work    Ambulatory referral to Physical Therapy    Ambulatory referral to Social Work  Cancer Staging Malignant neoplasm of axillary tail of right breast in female, estrogen receptor positive ([place]) Staging form: Breast, AJCC 8th Edition - Clinical stage from 05/14/2017: Stage IB (cT1c, cN1, cM0, G1, ER+, PR+, HER2-) - Unsigned - Pathologic: Stage IB (pT1c, pN2a, cM0, G1, ER+, PR+, HER2-) - Signed by Eppie Gibson, MD on 12/23/2017   CHIEF COMPLAINT: Here to discuss management of right breast cancer  Narrative:  The patient returns today for follow-up.     Since consultation date, she underwent the following imaging (dates and results as follows): On 05/09/2017 she underwent MRI of the breasts.  This showed potential multifocal disease in the right breast.  The left breast and axilla were negative.  The right axilla showed biopsy-proven right axillary lymph node with the clip and an adjacent slightly suspicious 6 mm node  On 05/21/2017 she underwent CT of the chest abdomen and pelvis and a bone scan.  No evidence of metastatic disease  Breast or nodal surgery, since consultation, involved (dates and results as follows): on 06-05-17 Dr Barry Dienes performed left simple mastectomy (benign tissue) and right simple mastectomy (revealing 3 tumors measuring 1.2, 1.2, and 1.0 cm.  All grade 1.  There was also DCIS which was intermediate grade.  There is diffuse LVSI.  Margins are negative by at least 2 mm.  4 of 7 lymph nodes were positive.   Extracapsular extension was appreciated.  Following this, ALND on 06-26-17 revealed 2/22 positive axillary nodes.  Systemic therapy, if applicable, involved (dates and therapy as follows): Adjuvant dose dense Adriamycin and Cytoxan x4 followed by Taxol weekly x12  Symptomatically, the patient reports: Edema in the right upper extremity.  She is wearing a sleeve.  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  Meds: Current Outpatient Medications  Medication Sig Dispense Refill  . acetaminophen (TYLENOL) 500 MG tablet Take 1,000 mg by mouth every 6 (six) hours as needed for moderate pain or headache.    . insulin aspart (NOVOLOG) 100 UNIT/ML injection Inject 45 Units into the skin See admin instructions. Use via insulin pump up to 45 units daily    . lidocaine-prilocaine (EMLA) cream Apply to affected area once 30 g 3  . Probiotic Product (PROBIOTIC DAILY PO) Take 1 capsule by mouth at bedtime.     Marland Kitchen venlafaxine XR (EFFEXOR-XR) 75 MG 24 hr capsule Take 1 capsule (75 mg total) by mouth daily with breakfast. 30 capsule 3  . LORazepam (ATIVAN) 0.5 MG tablet TAKE 1 TABLET BY MOUTH EVERY 6 HOURS AS NEEDED FOR ANXIETY OR SLEEP 30 tablet 0  . ondansetron (ZOFRAN) 8 MG tablet Take 1 tablet (8 mg total) by mouth 2 (two) times daily as needed. Start on the third day after  chemotherapy. (Patient not taking: Reported on 10/09/2017) 30 tablet 1  . oxyCODONE-acetaminophen (PERCOCET/ROXICET) 5-325 MG tablet Take 1 tablet by mouth every 8 (eight) hours as needed for severe pain. 60 tablet 0   No current facility-administered medications for this encounter.     Physical Findings:  height is '5\' 2"'  (1.575 m) and weight is 170 lb 9.6 oz (77.4 kg). Her oral temperature is 98.2 F (36.8 C). Her blood pressure is 152/86 (abnormal) and her pulse is 89.  Her respiration is 20 and oxygen saturation is 99%. .    General: Alert and oriented, in no acute distress HEENT: Head is normocephalic. Extraocular movements are intact. Extremities: Mild right upper extremity lymphedema Psychiatric: Judgment and insight are intact. Affect is appropriate. Breast exam reveals no sign of local recurrence over the chest wall bilaterally.  Skin has healed well from bilateral mastectomies and right axillary lymph node dissection Skin exam over the chest appears unremarkable, surgical scars healed well  Lab Findings: Lab Results  Component Value Date   WBC 2.7 (L) 12/09/2017   HGB 10.7 (L) 12/09/2017   HCT 33.4 (L) 12/09/2017   MCV 92.5 12/09/2017   PLT 358 12/09/2017      Radiographic Findings: As above  Impression/Plan: We discussed adjuvant radiotherapy today.  I recommend radiation therapy in order to reduce risk of local regional recurrence to the right chest wall and regional nodes by two thirds.  The risks, benefits and side effects of this treatment were discussed in detail.  She understands that radiotherapy is associated with skin irritation and fatigue in the acute setting. Late effects can include cosmetic changes and rare injury to internal organs.   She is enthusiastic about proceeding with treatment. A consent form has been  signed and placed in her chart.  A total of 5 medically necessary complex treatment devices will be fabricated and supervised by me: 4 fields with MLCs for custom blocks to protect heart, and lungs;  and, a Vac-lok. MORE COMPLEX DEVICES MAY BE MADE IN DOSIMETRY FOR FIELD IN FIELD BEAMS FOR DOSE HOMOGENEITY.  I have requested : 3D Simulation which is medically necessary to give adequate dose to at risk tissues while sparing lungs and heart.  I have requested a DVH of the following structures: lungs, heart, spinal cord and esophagus.    The patient will receive 50 Gy in 25 fractions to the right chest wall and regional nodes  (IM, SCV, PAB)  with 4 fields.  This will be followed by a boost.  Simulation will take place today  Referral to PT and social work for more support.  I spent over 20 minutes face to face with the patient and more than 50% of that time was spent in counseling and/or coordination of care. _____________________________________   Eppie Gibson, MD

## 2017-12-22 NOTE — Progress Notes (Signed)
  Radiation Oncology         [phone] ________________________________  Name: LANEA VANKIRK MRN: [id number]  Date: 12/22/2017  DOB: 07-21-1975  SIMULATION AND TREATMENT PLANNING NOTE    Outpatient  DIAGNOSIS:     ICD-10-CM   1. Malignant neoplasm of axillary tail of right breast in female, estrogen receptor positive ([place]) C50.611    Z17.0     NARRATIVE:  The patient was brought to the [facility].  Identity was confirmed.  All relevant records and images related to the planned course of therapy were reviewed.  The patient freely provided informed written consent to proceed with treatment after reviewing the details related to the planned course of therapy. The consent form was witnessed and verified by the simulation staff.    Then, the patient was set-up in a stable reproducible supine position for radiation therapy with her ipsilateral arm over her head, and her upper body secured in a custom-made Vac-lok device.  CT images were obtained.  Surface markings were placed.  The CT images were loaded into the planning software.    TREATMENT PLANNING NOTE: Treatment planning then occurred.  The radiation prescription was entered and confirmed.     A total of 5 medically necessary complex treatment devices were fabricated and supervised by me: 4 fields with MLCs for custom blocks to protect heart, and lungs;  and, a Vac-lok. MORE COMPLEX DEVICES MAY BE MADE IN DOSIMETRY FOR FIELD IN FIELD BEAMS FOR DOSE HOMOGENEITY.  I have requested : 3D Simulation which is medically necessary to give adequate dose to at risk tissues while sparing lungs and heart.  I have requested a DVH of the following structures: lungs, heart, esophagus, spinal cord.    The patient will receive 50 Gy in 25 fractions to the right chest wall, IM nodes, SCV nodes, and PAB nodes with 4 fields.  This will be followed by a boost.  Optical Surface Tracking Plan:  Since intensity modulated radiotherapy (IMRT)  and 3D conformal radiation treatment methods are predicated on accurate and precise positioning for treatment, intrafraction motion monitoring is medically necessary to ensure accurate and safe treatment delivery. The ability to quantify intrafraction motion without excessive ionizing radiation dose can only be performed with optical surface tracking. Accordingly, surface imaging offers the opportunity to obtain 3D measurements of patient position throughout IMRT and 3D treatments without excessive radiation exposure. I am ordering optical surface tracking for this patient's upcoming course of radiotherapy.  ________________________________   Reference:  Ursula Alert, J, et al. Surface imaging-based analysis of intrafraction motion for breast radiotherapy patients.Journal of [place], n. 6, nov. 2014. ISSN [id number].  Available at: <http://www.jacmp.org/index.php/jacmp/article/view/4957>.    -----------------------------------  Eppie Gibson, MD

## 2017-12-23 ENCOUNTER — Telehealth: Payer: Self-pay | Admitting: Hematology and Oncology

## 2017-12-23 ENCOUNTER — Encounter: Payer: Self-pay | Admitting: *Deleted

## 2017-12-23 ENCOUNTER — Encounter: Payer: Self-pay | Admitting: Radiation Oncology

## 2017-12-23 ENCOUNTER — Telehealth: Payer: Self-pay

## 2017-12-23 DIAGNOSIS — C50611 Malignant neoplasm of axillary tail of right female breast: Secondary | ICD-10-CM | POA: Insufficient documentation

## 2017-12-23 DIAGNOSIS — Z510 Encounter for antineoplastic radiation therapy: Secondary | ICD-10-CM | POA: Diagnosis not present

## 2017-12-23 DIAGNOSIS — Z170 Estrogen receptor positive status [ER+]: Secondary | ICD-10-CM | POA: Insufficient documentation

## 2017-12-23 NOTE — Progress Notes (Signed)
[place] Psychosocial Distress Screening Clinical Social Work  Clinical Social Work was referred by distress screening protocol.  The patient scored a 5 on the Psychosocial Distress Thermometer which indicates moderate distress. Clinical Social Worker contacted patient by phone to assess for distress and other psychosocial needs.  Patient stated she was doing well, but did become tearful when discussing her treatment journey and fear of recurrence.  CSw provided patient support and normalized patients feelings.  CSW provided education on [facility] support team and support programs at [facility].  Patient expressed interest in support groups and programs at [facility]. CSW mailed patient a packet with [place] support calendar and contact information.  CSW encouraged patient to call with additional needs or concerns.       ONCBCN DISTRESS SCREENING 12/22/2017  Screening Type Initial Screening  Distress experienced in past week (1-10) 5  Emotional problem type Depression;Nervousness/Anxiety  Physical Problem type Pain;Tingling hands/feet;Swollen arms/legs    Johnnye Lana, MSW, LCSW, OSW-C Clinical Social Worker [facility] [phone]

## 2017-12-23 NOTE — Telephone Encounter (Signed)
Scheduled appt per 10/1 sch message - sent reminder letter in the mail with appt date and time.   

## 2017-12-23 NOTE — Telephone Encounter (Signed)
Returned patient's call.  Patient requesting refill on pain medication.  Verbal okay given by Dr. Lindi Adie.  Patient informed prescription is ready for pick up.  Script placed in medication book.  No further needs at this time.

## 2017-12-29 ENCOUNTER — Ambulatory Visit
Admission: RE | Admit: 2017-12-29 | Discharge: 2017-12-29 | Disposition: A | Discharge status: Home / Self Care | Payer: BLUE CROSS/BLUE SHIELD | Source: Ambulatory Visit | Attending: Radiation Oncology | Admitting: Radiation Oncology

## 2017-12-29 DIAGNOSIS — Z170 Estrogen receptor positive status [ER+]: Secondary | ICD-10-CM | POA: Diagnosis not present

## 2017-12-29 DIAGNOSIS — Z510 Encounter for antineoplastic radiation therapy: Secondary | ICD-10-CM | POA: Diagnosis not present

## 2017-12-29 DIAGNOSIS — C50611 Malignant neoplasm of axillary tail of right female breast: Secondary | ICD-10-CM | POA: Diagnosis not present

## 2017-12-30 ENCOUNTER — Ambulatory Visit
Admission: RE | Admit: 2017-12-30 | Discharge: 2017-12-30 | Disposition: A | Discharge status: Home / Self Care | Payer: BLUE CROSS/BLUE SHIELD | Source: Ambulatory Visit | Attending: Radiation Oncology | Admitting: Radiation Oncology

## 2017-12-30 DIAGNOSIS — C50611 Malignant neoplasm of axillary tail of right female breast: Secondary | ICD-10-CM | POA: Diagnosis not present

## 2017-12-30 DIAGNOSIS — Z510 Encounter for antineoplastic radiation therapy: Secondary | ICD-10-CM | POA: Diagnosis not present

## 2017-12-30 DIAGNOSIS — Z170 Estrogen receptor positive status [ER+]: Secondary | ICD-10-CM | POA: Diagnosis not present

## 2017-12-31 ENCOUNTER — Ambulatory Visit
Admission: RE | Admit: 2017-12-31 | Discharge: 2017-12-31 | Disposition: A | Discharge status: Home / Self Care | Payer: BLUE CROSS/BLUE SHIELD | Source: Ambulatory Visit | Attending: Radiation Oncology | Admitting: Radiation Oncology

## 2017-12-31 DIAGNOSIS — C50611 Malignant neoplasm of axillary tail of right female breast: Secondary | ICD-10-CM | POA: Diagnosis not present

## 2017-12-31 DIAGNOSIS — Z170 Estrogen receptor positive status [ER+]: Secondary | ICD-10-CM | POA: Diagnosis not present

## 2017-12-31 DIAGNOSIS — Z510 Encounter for antineoplastic radiation therapy: Secondary | ICD-10-CM | POA: Diagnosis not present

## 2017-12-31 DIAGNOSIS — Z129 Encounter for screening for malignant neoplasm, site unspecified: Secondary | ICD-10-CM | POA: Diagnosis not present

## 2018-01-01 ENCOUNTER — Ambulatory Visit
Admission: RE | Admit: 2018-01-01 | Discharge: 2018-01-01 | Disposition: A | Discharge status: Home / Self Care | Payer: BLUE CROSS/BLUE SHIELD | Source: Ambulatory Visit | Attending: Radiation Oncology | Admitting: Radiation Oncology

## 2018-01-01 ENCOUNTER — Encounter: Payer: Self-pay | Admitting: Physical Therapy

## 2018-01-01 ENCOUNTER — Ambulatory Visit: Payer: BLUE CROSS/BLUE SHIELD | Attending: Radiation Oncology | Admitting: Physical Therapy

## 2018-01-01 ENCOUNTER — Other Ambulatory Visit: Payer: Self-pay

## 2018-01-01 DIAGNOSIS — M79622 Pain in left upper arm: Secondary | ICD-10-CM | POA: Diagnosis not present

## 2018-01-01 DIAGNOSIS — Z510 Encounter for antineoplastic radiation therapy: Secondary | ICD-10-CM | POA: Diagnosis not present

## 2018-01-01 DIAGNOSIS — Z170 Estrogen receptor positive status [ER+]: Secondary | ICD-10-CM | POA: Diagnosis not present

## 2018-01-01 DIAGNOSIS — R293 Abnormal posture: Secondary | ICD-10-CM | POA: Diagnosis not present

## 2018-01-01 DIAGNOSIS — M25612 Stiffness of left shoulder, not elsewhere classified: Secondary | ICD-10-CM | POA: Diagnosis not present

## 2018-01-01 DIAGNOSIS — M25611 Stiffness of right shoulder, not elsewhere classified: Secondary | ICD-10-CM | POA: Diagnosis not present

## 2018-01-01 DIAGNOSIS — I972 Postmastectomy lymphedema syndrome: Secondary | ICD-10-CM | POA: Insufficient documentation

## 2018-01-01 DIAGNOSIS — C50611 Malignant neoplasm of axillary tail of right female breast: Secondary | ICD-10-CM | POA: Diagnosis not present

## 2018-01-01 DIAGNOSIS — Z483 Aftercare following surgery for neoplasm: Secondary | ICD-10-CM | POA: Diagnosis not present

## 2018-01-01 DIAGNOSIS — C50911 Malignant neoplasm of unspecified site of right female breast: Secondary | ICD-10-CM

## 2018-01-01 DIAGNOSIS — M79621 Pain in right upper arm: Secondary | ICD-10-CM | POA: Diagnosis not present

## 2018-01-01 DIAGNOSIS — C773 Secondary and unspecified malignant neoplasm of axilla and upper limb lymph nodes: Principal | ICD-10-CM

## 2018-01-01 DIAGNOSIS — R600 Localized edema: Secondary | ICD-10-CM | POA: Insufficient documentation

## 2018-01-01 MED ORDER — ALRA NON-METALLIC DEODORANT (RAD-ONC)
1.0000 "application " | Freq: Once | TOPICAL | Status: AC
  Administered 2018-01-01: 1 via TOPICAL

## 2018-01-01 MED ORDER — RADIAPLEXRX EX GEL
Freq: Once | CUTANEOUS | Status: AC
  Administered 2018-01-01: 11:00:00 via TOPICAL

## 2018-01-01 NOTE — Progress Notes (Signed)

## 2018-01-01 NOTE — Therapy (Signed)
[place], [place], [address] Phone: [phone]   Fax:  [phone]  Physical Therapy Evaluation  Patient Details  Name: Stephanie Holden MRN: [id number] Date of Birth: 04-20-1975 Referring Provider (PT): Dr. Eppie Gibson   Encounter Date: 01/01/2018  PT End of Session - 01/01/18 1049    Visit Number  1    Number of Visits  12    Date for PT Re-Evaluation  02/12/18    Authorization Type  Patient only has 30 PT visits per year and as of 01/01/18, she had already used 18.    PT Start Time  0800    PT Stop Time  0845    PT Time Calculation (min)  45 min    Activity Tolerance  Patient tolerated treatment well    Behavior During Therapy  WFL for tasks assessed/performed       Past Medical History:  Diagnosis Date  . Cancer [facility])    Breast cancer - right  . Constipation   . Depression   . Genetic testing 05/27/2017   Breast/GYN panel (23 genes) @ [facility] - No pathogenic mutations detected  . GERD (gastroesophageal reflux disease)   . Insulin dependent type 1 diabetes mellitus ([place])   . Insulin pump in place   . PONV (postoperative nausea and vomiting)   . Stenosing tenosynovitis of thumb 06/2014   right  . Thyroid goiter     Past Surgical History:  Procedure Laterality Date  . AXILLARY LYMPH NODE DISSECTION Right 06/26/2017   Procedure: AXILLARY LYMPH NODE DISSECTION;  Surgeon: [name], MD;  Location: [place];  Service: General;  Laterality: Right;  . CARPAL TUNNEL RELEASE Bilateral 10/28/2013   Procedure: BILATERAL CARPAL TUNNEL RELEASE;  Surgeon: Daryll Brod, MD;  Location: [place];  Service: Orthopedics;  Laterality: Bilateral;  . CESAREAN SECTION  2000; 08/20/2001  . ESSURE TUBAL LIGATION  2005   failed, tubal puncture  . LAPAROSCOPIC TUBAL LIGATION  04/02/2007   removal Essure  . LAPAROSCOPIC UNILATERAL SALPINGECTOMY  2005  . MASTECTOMY W/ SENTINEL NODE BIOPSY Bilateral  06/05/2017   Procedure: BILATERAL MASTECTOMIES  WITH RIGHT SENTINEL LYMPH NODE BIOPSY;  Surgeon: [name], MD;  Location: [place];  Service: General;  Laterality: Bilateral;  . PORTACATH PLACEMENT Left 06/26/2017   Procedure: INSERTION PORT-A-CATH;  Surgeon: [name], MD;  Location: [place];  Service: General;  Laterality: Left;  . TRIGGER FINGER RELEASE Left 04/12/2014   Procedure: RELEASE A-1 PULLEY LEFT THUMB;  Surgeon: Daryll Brod, MD;  Location: [place];  Service: Orthopedics;  Laterality: Left;  . TRIGGER FINGER RELEASE Right 07/19/2014   Procedure: RELEASE TRIGGER FINGER/A-1 PULLEY RIGHT THUMB;  Surgeon: Daryll Brod, MD;  Location: [place];  Service: Orthopedics;  Laterality: Right;    There were no vitals filed for this visit.   Subjective Assessment - 01/01/18 0807    Subjective  Patient reports she was outside on 12/18/17 when she noticed her right arm began swelling. She wore her compression sleeve regularly and has been doing manual lymph drainage but reports it has helped but still swells. She is here today to try to reduce her edema.    Pertinent History  Patient was diagnosed on 05/08/17 with right invasive ductal carcinoma breast cancer. She was seen in 5/18 and found to have a right axillary mass but did not f/u for further testing. She then had a mammogram in 2/19 and was diagnosed  with right breast cancer which had metastasized to the axillary lymph nodes. She had multiple small masses in the lower outer quadrant of her right breast with the largest mass measuring 1.3 cm. It is ER/PR positive and HER2 negative with a Ki67 of 2%. Patient underwent a bilateral mastectomy with right targeted node dissection with 4/7 positive on 06/05/17 for right invasive ductal carcinoma. She then underwent a right axillary node dissection on 06/26/17 with 2/20 positive nodes. Chemo completed 12/09/17 and began radiation 12/30/17.    Patient Stated Goals  Reduce swelling     Currently in Pain?  Yes    Pain Score  5     Pain Location  Arm    Pain Orientation  Right;Posterior;Upper    Pain Descriptors / Indicators  Sharp    Pain Type  Neuropathic pain    Pain Onset  1 to 4 weeks ago    Pain Frequency  Intermittent    Aggravating Factors   Unknown    Pain Relieving Factors  Manual lymph drainage         [facility] PT Assessment - 01/01/18 0001      Assessment   Medical Diagnosis  Right arm lymphedema    Referring Provider (PT)  Dr. Eppie Gibson    Onset Date/Surgical Date  12/18/17    Hand Dominance  Right    Prior Therapy  yes for same issue      Precautions   Precautions  Other (comment)    Precaution Comments  Currently doing radiation      Restrictions   Weight Bearing Restrictions  No      Balance Screen   Has the patient fallen in the past 6 months  No    Has the patient had a decrease in activity level because of a fear of falling?   No    Is the patient reluctant to leave their home because of a fear of falling?   No      Home Environment   Living Environment  Private residence    Living Arrangements  Spouse/significant other;Children   Husband, 24 and 13 y.o.   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    Leisure  She is not exercising      Cognition   Overall Cognitive Status  Within Functional Limits for tasks assessed      Posture/Postural Control   Posture/Postural Control  Postural limitations    Postural Limitations  Rounded Shoulders;Forward head        LYMPHEDEMA/ONCOLOGY QUESTIONNAIRE - 01/01/18 0813      Type   Cancer Type  Right breast      Surgeries   Mastectomy Date  06/05/17    Axillary Lymph Node Dissection Date  06/26/17    Number Lymph Nodes Removed  29      Date Lymphedema/Swelling Started   Date  12/18/17      Treatment    Active Chemotherapy Treatment  No    Past Chemotherapy Treatment  Yes    Date  12/09/17    Active Radiation Treatment  Yes    Date  12/30/17    Body Site  right breast and axilla    Past Radiation Treatment  No    Current Hormone Treatment  No  Past Hormone Therapy  No      What other symptoms do you have   Are you Having Heaviness or Tightness  Yes    Are you having Pain  Yes    Are you having pitting edema  No    Is it Hard or Difficult finding clothes that fit  No    Do you have infections  No    Is there Decreased scar mobility  Yes    Stemmer Sign  No      Lymphedema Stage   Stage  STAGE 2 SPONTANEOUSLY IRREVERSIBLE      Lymphedema Assessments   Lymphedema Assessments  Upper extremities      Right Upper Extremity Lymphedema   15 cm Proximal to Olecranon Process  36.8 cm    10 cm Proximal to Olecranon Process  34.9 cm    Olecranon Process  26.9 cm    10 cm Proximal to Ulnar Styloid Process  25 cm    Just Proximal to Ulnar Styloid Process  16.1 cm    Across Hand at [employer]  18.1 cm    At [place] of 2nd Digit  6 cm      Left Upper Extremity Lymphedema   15 cm Proximal to Olecranon Process  37.3 cm    10 cm Proximal to Olecranon Process  34.8 cm    Olecranon Process  25.8 cm    15 cm Proximal to Ulnar Styloid Process  25.5 cm    10 cm Proximal to Ulnar Styloid Process  23.3 cm    Just Proximal to Ulnar Styloid Process  15 cm    Across Hand at [employer]  17.5 cm    At [place] of 2nd Digit  6 cm             Outpatient Rehab [facility]01/[facility]  Lymphedema Life Impact Scale Total Score  23.53 %      Objective measurements completed on examination: See above findings.      [facility] Adult PT Treatment/Exercise - 01/01/18 0001      Manual Therapy   Manual Therapy  Manual Lymphatic Drainage (MLD)    Manual Lymphatic Drainage (MLD)  In Supine: Short neck, Rt inguinal nodes and Rt axillo-inguinal anastomosis,  left axillary and anterior inter-axillary pathways; then Rt UE from lateral upper arm to dorsal hand working proximal to distal then retracing all steps             PT Education - 01/01/18 1048    Education provided  Yes    Education Details  Instructed pt t begin wearing her compression sleeve and glove together during awake hours    Person(s) Educated  Patient    Comprehension  Verbalized understanding       PT Short Term Goals - 01/01/18 1100      PT SHORT TERM GOAL #1   Title  Patient will verbalize where and how to be fitted for a night time compression garment.    Time  3    Period  Weeks    Status  New      PT SHORT TERM GOAL #2   Title  Patient will decease right arm swelling at 10 cm proximal to her ulnar styloid process to be </= 24 cm.    Baseline  25 cm    Time  3    Period  Weeks    Status  New  PT SHORT TERM GOAL #3   Title  Patient will decease right arm swelling at her olecranon to be </=26 cm.    Baseline  26.9    Time  3    Period  Weeks    Status  New        PT Long Term Goals - 01/01/18 1102      PT LONG TERM GOAL #2   Title  Patient will decease right arm swelling at her olecranon to be </=25 cm.    Baseline  26.9 cm    Time  6    Period  Weeks    Status  New      PT LONG TERM GOAL #3   Title  Patient will verbalize good understanding of the Maintenance Phase of treatment for lymphedema.    Baseline  .    Time  6    Status  New      PT LONG TERM GOAL #4   Title  Patient will demonstrate proper technique for performing self manual lymph drainage.    Time  6    Period  Weeks    Status  New             Plan - 01/01/18 1050    Clinical Impression Statement  Patient has increased right arm edema since she was last since by about 2 cm. Her edema increased 2 weeks ago when she was outside in very hot weather. She has been wearing her compression sleeve and doing manual lymph drainage regularly but reports edema has not reduced  significantly. She had not been wearing her glove and reported that her hand swelled with her sleeve. We discussed the option of compression bandaging but she declined since she is doing radiation now. We will begin PT 2x/week (she only has 12 vsits left this year with her insurance) focused on manual lymph drainage and will also work to help her get a night time compression garment.    History and Personal Factors relevant to plan of care:  Currently undergoing radiation and recently completed chemotherapy.    Clinical Presentation  Stable    Rehab Potential  Good    Clinical Impairments Affecting Rehab Potential  Radiation to right axilla may worsen lymphedema    PT Frequency  2x / week    PT Duration  6 weeks    PT Treatment/Interventions  ADLs/Self Care Home Management;Therapeutic activities;Therapeutic exercise;Patient/family education;Manual techniques;Manual lymph drainage;Scar mobilization;Passive range of motion;DME Instruction    PT Next Visit Plan  Continue manual lymph drainage; demographics sent to Claudie Fisherman at [facility] to begin authorization for night time garment    Consulted and Agree with Plan of Care  Patient       Patient will benefit from skilled therapeutic intervention in order to improve the following deficits and impairments:  Postural dysfunction, Decreased knowledge of precautions, Impaired UE functional use, Pain, Increased edema  Visit Diagnosis: Abnormal posture - Plan: PT plan of care cert/re-cert  Aftercare following surgery for neoplasm - Plan: PT plan of care cert/re-cert  Postmastectomy lymphedema - Plan: PT plan of care cert/re-cert     Problem List Patient Active Problem List   Diagnosis Date Noted  . Breast cancer metastasized to axillary lymph node, right ([place]) 06/05/2017  . Genetic testing 05/27/2017  . Malignant neoplasm of axillary tail of right breast in female, estrogen receptor positive ([place]) 05/09/2017  . Cubital tunnel syndrome on left  10/02/2015  . Trigger finger, left index  finger 04/12/2015  . Erroneous encounter - disregard 09/22/2014  . Diabetes type 1, controlled ([place]) 01/12/2013   Annia Friendly, PT 01/01/18 12:00 PM  [place], [place], [address] Phone: [phone]   Fax:  [phone]  Name: Stephanie Holden MRN: [id number] Date of Birth: Aug 09, 1975

## 2018-01-02 ENCOUNTER — Ambulatory Visit
Admission: RE | Admit: 2018-01-02 | Discharge: 2018-01-02 | Disposition: A | Discharge status: Home / Self Care | Payer: BLUE CROSS/BLUE SHIELD | Source: Ambulatory Visit | Attending: Radiation Oncology | Admitting: Radiation Oncology

## 2018-01-02 DIAGNOSIS — Z170 Estrogen receptor positive status [ER+]: Secondary | ICD-10-CM | POA: Diagnosis not present

## 2018-01-02 DIAGNOSIS — Z510 Encounter for antineoplastic radiation therapy: Secondary | ICD-10-CM | POA: Diagnosis not present

## 2018-01-02 DIAGNOSIS — C50611 Malignant neoplasm of axillary tail of right female breast: Secondary | ICD-10-CM | POA: Diagnosis not present

## 2018-01-04 LAB — FECAL OCCULT BLOOD, IMMUNOCHEMICAL: Fecal Occult Bld: NEGATIVE

## 2018-01-05 ENCOUNTER — Ambulatory Visit
Admission: RE | Admit: 2018-01-05 | Discharge: 2018-01-05 | Disposition: A | Discharge status: Home / Self Care | Payer: BLUE CROSS/BLUE SHIELD | Source: Ambulatory Visit | Attending: Radiation Oncology | Admitting: Radiation Oncology

## 2018-01-05 ENCOUNTER — Telehealth: Payer: Self-pay

## 2018-01-05 DIAGNOSIS — Z170 Estrogen receptor positive status [ER+]: Secondary | ICD-10-CM | POA: Diagnosis not present

## 2018-01-05 DIAGNOSIS — C50611 Malignant neoplasm of axillary tail of right female breast: Secondary | ICD-10-CM | POA: Diagnosis not present

## 2018-01-05 DIAGNOSIS — Z510 Encounter for antineoplastic radiation therapy: Secondary | ICD-10-CM | POA: Diagnosis not present

## 2018-01-05 DIAGNOSIS — R05 Cough: Secondary | ICD-10-CM | POA: Diagnosis not present

## 2018-01-05 DIAGNOSIS — J189 Pneumonia, unspecified organism: Secondary | ICD-10-CM | POA: Diagnosis not present

## 2018-01-05 DIAGNOSIS — R0600 Dyspnea, unspecified: Secondary | ICD-10-CM | POA: Diagnosis not present

## 2018-01-05 DIAGNOSIS — Z6829 Body mass index (BMI) 29.0-29.9, adult: Secondary | ICD-10-CM | POA: Diagnosis not present

## 2018-01-05 NOTE — Telephone Encounter (Signed)
Returned patient's call.  Patient c/o "rattling in her chest," along with shortness of breath when lying down at night.    Patient denies any fever, dry cough.  Started last Tuesday.    Patient has made appointment with PCP today @ 2pm.  No further needs at this time.

## 2018-01-06 ENCOUNTER — Encounter: Payer: Self-pay | Admitting: Physical Therapy

## 2018-01-06 ENCOUNTER — Ambulatory Visit
Admission: RE | Admit: 2018-01-06 | Discharge: 2018-01-06 | Disposition: A | Discharge status: Home / Self Care | Payer: BLUE CROSS/BLUE SHIELD | Source: Ambulatory Visit | Attending: Radiation Oncology | Admitting: Radiation Oncology

## 2018-01-06 ENCOUNTER — Ambulatory Visit: Payer: BLUE CROSS/BLUE SHIELD | Admitting: Physical Therapy

## 2018-01-06 DIAGNOSIS — Z510 Encounter for antineoplastic radiation therapy: Secondary | ICD-10-CM | POA: Diagnosis not present

## 2018-01-06 DIAGNOSIS — Z170 Estrogen receptor positive status [ER+]: Secondary | ICD-10-CM | POA: Diagnosis not present

## 2018-01-06 DIAGNOSIS — C50611 Malignant neoplasm of axillary tail of right female breast: Secondary | ICD-10-CM | POA: Diagnosis not present

## 2018-01-07 ENCOUNTER — Ambulatory Visit
Admission: RE | Admit: 2018-01-07 | Discharge: 2018-01-07 | Disposition: A | Discharge status: Home / Self Care | Payer: BLUE CROSS/BLUE SHIELD | Source: Ambulatory Visit | Attending: Radiation Oncology | Admitting: Radiation Oncology

## 2018-01-07 DIAGNOSIS — E7800 Pure hypercholesterolemia, unspecified: Secondary | ICD-10-CM | POA: Diagnosis not present

## 2018-01-07 DIAGNOSIS — E10319 Type 1 diabetes mellitus with unspecified diabetic retinopathy without macular edema: Secondary | ICD-10-CM | POA: Diagnosis not present

## 2018-01-07 DIAGNOSIS — E109 Type 1 diabetes mellitus without complications: Secondary | ICD-10-CM | POA: Diagnosis not present

## 2018-01-07 DIAGNOSIS — Z510 Encounter for antineoplastic radiation therapy: Secondary | ICD-10-CM | POA: Diagnosis not present

## 2018-01-07 DIAGNOSIS — Z170 Estrogen receptor positive status [ER+]: Secondary | ICD-10-CM | POA: Diagnosis not present

## 2018-01-07 DIAGNOSIS — J189 Pneumonia, unspecified organism: Secondary | ICD-10-CM | POA: Diagnosis not present

## 2018-01-07 DIAGNOSIS — Z794 Long term (current) use of insulin: Secondary | ICD-10-CM | POA: Diagnosis not present

## 2018-01-07 DIAGNOSIS — C50611 Malignant neoplasm of axillary tail of right female breast: Secondary | ICD-10-CM | POA: Diagnosis not present

## 2018-01-07 DIAGNOSIS — C50411 Malignant neoplasm of upper-outer quadrant of right female breast: Secondary | ICD-10-CM | POA: Diagnosis not present

## 2018-01-08 ENCOUNTER — Ambulatory Visit
Admission: RE | Admit: 2018-01-08 | Discharge: 2018-01-08 | Disposition: A | Discharge status: Home / Self Care | Payer: BLUE CROSS/BLUE SHIELD | Source: Ambulatory Visit | Attending: Radiation Oncology | Admitting: Radiation Oncology

## 2018-01-08 ENCOUNTER — Ambulatory Visit: Payer: BLUE CROSS/BLUE SHIELD

## 2018-01-08 DIAGNOSIS — Z510 Encounter for antineoplastic radiation therapy: Secondary | ICD-10-CM | POA: Diagnosis not present

## 2018-01-08 DIAGNOSIS — Z170 Estrogen receptor positive status [ER+]: Secondary | ICD-10-CM | POA: Diagnosis not present

## 2018-01-08 DIAGNOSIS — C50611 Malignant neoplasm of axillary tail of right female breast: Secondary | ICD-10-CM | POA: Diagnosis not present

## 2018-01-09 ENCOUNTER — Ambulatory Visit
Admission: RE | Admit: 2018-01-09 | Discharge: 2018-01-09 | Disposition: A | Discharge status: Home / Self Care | Payer: BLUE CROSS/BLUE SHIELD | Source: Ambulatory Visit | Attending: Radiation Oncology | Admitting: Radiation Oncology

## 2018-01-09 DIAGNOSIS — Z510 Encounter for antineoplastic radiation therapy: Secondary | ICD-10-CM | POA: Diagnosis not present

## 2018-01-09 DIAGNOSIS — Z170 Estrogen receptor positive status [ER+]: Secondary | ICD-10-CM | POA: Diagnosis not present

## 2018-01-09 DIAGNOSIS — C50611 Malignant neoplasm of axillary tail of right female breast: Secondary | ICD-10-CM | POA: Diagnosis not present

## 2018-01-12 ENCOUNTER — Ambulatory Visit
Admission: RE | Admit: 2018-01-12 | Discharge: 2018-01-12 | Disposition: A | Discharge status: Home / Self Care | Payer: BLUE CROSS/BLUE SHIELD | Source: Ambulatory Visit | Attending: Internal Medicine | Admitting: Internal Medicine

## 2018-01-12 ENCOUNTER — Other Ambulatory Visit: Payer: Self-pay

## 2018-01-12 ENCOUNTER — Encounter (HOSPITAL_COMMUNITY): Payer: Self-pay | Admitting: Emergency Medicine

## 2018-01-12 ENCOUNTER — Telehealth: Payer: Self-pay | Admitting: Hematology and Oncology

## 2018-01-12 ENCOUNTER — Other Ambulatory Visit: Payer: Self-pay | Admitting: Internal Medicine

## 2018-01-12 ENCOUNTER — Other Ambulatory Visit: Payer: Self-pay | Admitting: Hematology and Oncology

## 2018-01-12 ENCOUNTER — Emergency Department (HOSPITAL_COMMUNITY)
Admission: EM | Admit: 2018-01-12 | Discharge: 2018-01-12 | Disposition: A | Discharge status: Home / Self Care | Payer: BLUE CROSS/BLUE SHIELD | Source: Home / Self Care | Attending: Emergency Medicine | Admitting: Emergency Medicine

## 2018-01-12 ENCOUNTER — Ambulatory Visit
Admission: RE | Admit: 2018-01-12 | Discharge: 2018-01-12 | Disposition: A | Discharge status: Home / Self Care | Payer: BLUE CROSS/BLUE SHIELD | Source: Ambulatory Visit | Attending: Radiation Oncology | Admitting: Radiation Oncology

## 2018-01-12 DIAGNOSIS — R0609 Other forms of dyspnea: Secondary | ICD-10-CM | POA: Diagnosis not present

## 2018-01-12 DIAGNOSIS — R0600 Dyspnea, unspecified: Secondary | ICD-10-CM

## 2018-01-12 DIAGNOSIS — J189 Pneumonia, unspecified organism: Secondary | ICD-10-CM

## 2018-01-12 DIAGNOSIS — Z170 Estrogen receptor positive status [ER+]: Secondary | ICD-10-CM | POA: Diagnosis not present

## 2018-01-12 DIAGNOSIS — Y95 Nosocomial condition: Secondary | ICD-10-CM | POA: Diagnosis not present

## 2018-01-12 DIAGNOSIS — Z5321 Procedure and treatment not carried out due to patient leaving prior to being seen by health care provider: Secondary | ICD-10-CM | POA: Insufficient documentation

## 2018-01-12 DIAGNOSIS — Z6829 Body mass index (BMI) 29.0-29.9, adult: Secondary | ICD-10-CM | POA: Diagnosis not present

## 2018-01-12 DIAGNOSIS — C50611 Malignant neoplasm of axillary tail of right female breast: Secondary | ICD-10-CM

## 2018-01-12 DIAGNOSIS — E109 Type 1 diabetes mellitus without complications: Secondary | ICD-10-CM | POA: Diagnosis not present

## 2018-01-12 DIAGNOSIS — C773 Secondary and unspecified malignant neoplasm of axilla and upper limb lymph nodes: Secondary | ICD-10-CM | POA: Diagnosis not present

## 2018-01-12 DIAGNOSIS — C50919 Malignant neoplasm of unspecified site of unspecified female breast: Secondary | ICD-10-CM | POA: Diagnosis not present

## 2018-01-12 DIAGNOSIS — Z923 Personal history of irradiation: Secondary | ICD-10-CM | POA: Diagnosis not present

## 2018-01-12 DIAGNOSIS — Z794 Long term (current) use of insulin: Secondary | ICD-10-CM | POA: Diagnosis not present

## 2018-01-12 DIAGNOSIS — R846 Abnormal cytological findings in specimens from respiratory organs and thorax: Secondary | ICD-10-CM | POA: Diagnosis not present

## 2018-01-12 DIAGNOSIS — Z9641 Presence of insulin pump (external) (internal): Secondary | ICD-10-CM | POA: Diagnosis not present

## 2018-01-12 DIAGNOSIS — Z9221 Personal history of antineoplastic chemotherapy: Secondary | ICD-10-CM | POA: Diagnosis not present

## 2018-01-12 DIAGNOSIS — R0602 Shortness of breath: Secondary | ICD-10-CM | POA: Diagnosis not present

## 2018-01-12 DIAGNOSIS — J90 Pleural effusion, not elsewhere classified: Secondary | ICD-10-CM | POA: Diagnosis not present

## 2018-01-12 HISTORY — DX: Pneumonia, unspecified organism: J18.9

## 2018-01-12 MED ORDER — SODIUM CHLORIDE 0.9 % IV BOLUS: 500.0000 mL | Freq: Once | INTRAVENOUS | Status: DC

## 2018-01-12 MED ORDER — IOPAMIDOL (ISOVUE-370) INJECTION 76%
75.0000 mL | Freq: Once | INTRAVENOUS | Status: AC | PRN
  Administered 2018-01-12: 75 mL via INTRAVENOUS

## 2018-01-12 NOTE — ED Notes (Signed)
Pt states that she was suppose to be a direct admit and wasn't going to stay in room 12 all night, pt didn't have direct admit orders Pt and family members left AMA

## 2018-01-12 NOTE — ED Triage Notes (Signed)
Patient reports completing 7 days oral abx after pneumonia dx last week. Reports continued cough with SOB while coughing. Seen at PCP today, and sent for further evaluation of worsening pneumonia. Hx breast cancer. Radiation this morning.

## 2018-01-12 NOTE — Telephone Encounter (Signed)
I reviewed the CT scan and I called the patient to discuss her symptoms. She has been on second round of antibiotics and does not seem to be improving. She sounded short of breath on the telephone. She denies any fevers but does not feel well overall. I recommended that she get admitted to the hospital. I called interventional radiology and spoke with Dr. Kathlene Cote and he was willing to perform a thoracentesis tomorrow if he request for it. She has moderate pleural effusion and will certainly improve her breathing with a thoracentesis. I suspect that she may have healthcare associated pneumonia and will need broad-spectrum IV antibiotics. She could continue her radiation therapy while she is in the hospital. I will request hospitalist to admit her for pneumonia.

## 2018-01-13 ENCOUNTER — Ambulatory Visit: Payer: BLUE CROSS/BLUE SHIELD

## 2018-01-13 ENCOUNTER — Inpatient Hospital Stay: Payer: BLUE CROSS/BLUE SHIELD | Attending: Hematology and Oncology | Admitting: Hematology and Oncology

## 2018-01-13 ENCOUNTER — Inpatient Hospital Stay (HOSPITAL_COMMUNITY)
Admission: AD | Admit: 2018-01-13 | Discharge: 2018-01-16 | DRG: 194 | Disposition: A | Discharge status: Home / Self Care | Payer: BLUE CROSS/BLUE SHIELD | Source: Ambulatory Visit | Attending: Internal Medicine | Admitting: Internal Medicine

## 2018-01-13 ENCOUNTER — Observation Stay (HOSPITAL_COMMUNITY): Payer: BLUE CROSS/BLUE SHIELD

## 2018-01-13 ENCOUNTER — Other Ambulatory Visit: Payer: Self-pay

## 2018-01-13 ENCOUNTER — Ambulatory Visit
Admission: RE | Admit: 2018-01-13 | Discharge: 2018-01-13 | Disposition: A | Discharge status: Home / Self Care | Payer: BLUE CROSS/BLUE SHIELD | Source: Ambulatory Visit | Attending: Radiation Oncology | Admitting: Radiation Oncology

## 2018-01-13 ENCOUNTER — Encounter (HOSPITAL_COMMUNITY): Payer: Self-pay

## 2018-01-13 DIAGNOSIS — J90 Pleural effusion, not elsewhere classified: Secondary | ICD-10-CM | POA: Diagnosis not present

## 2018-01-13 DIAGNOSIS — Z923 Personal history of irradiation: Secondary | ICD-10-CM

## 2018-01-13 DIAGNOSIS — Z9641 Presence of insulin pump (external) (internal): Secondary | ICD-10-CM | POA: Diagnosis present

## 2018-01-13 DIAGNOSIS — Z170 Estrogen receptor positive status [ER+]: Secondary | ICD-10-CM

## 2018-01-13 DIAGNOSIS — Z9013 Acquired absence of bilateral breasts and nipples: Secondary | ICD-10-CM

## 2018-01-13 DIAGNOSIS — Z9889 Other specified postprocedural states: Secondary | ICD-10-CM

## 2018-01-13 DIAGNOSIS — C50611 Malignant neoplasm of axillary tail of right female breast: Secondary | ICD-10-CM | POA: Diagnosis not present

## 2018-01-13 DIAGNOSIS — Y95 Nosocomial condition: Secondary | ICD-10-CM | POA: Diagnosis present

## 2018-01-13 DIAGNOSIS — Z79899 Other long term (current) drug therapy: Secondary | ICD-10-CM | POA: Insufficient documentation

## 2018-01-13 DIAGNOSIS — E109 Type 1 diabetes mellitus without complications: Secondary | ICD-10-CM | POA: Diagnosis present

## 2018-01-13 DIAGNOSIS — R0609 Other forms of dyspnea: Secondary | ICD-10-CM

## 2018-01-13 DIAGNOSIS — R0602 Shortness of breath: Secondary | ICD-10-CM

## 2018-01-13 DIAGNOSIS — C773 Secondary and unspecified malignant neoplasm of axilla and upper limb lymph nodes: Secondary | ICD-10-CM | POA: Diagnosis not present

## 2018-01-13 DIAGNOSIS — J189 Pneumonia, unspecified organism: Secondary | ICD-10-CM | POA: Diagnosis not present

## 2018-01-13 DIAGNOSIS — Z794 Long term (current) use of insulin: Secondary | ICD-10-CM

## 2018-01-13 DIAGNOSIS — R846 Abnormal cytological findings in specimens from respiratory organs and thorax: Secondary | ICD-10-CM | POA: Diagnosis not present

## 2018-01-13 DIAGNOSIS — C50919 Malignant neoplasm of unspecified site of unspecified female breast: Secondary | ICD-10-CM | POA: Diagnosis not present

## 2018-01-13 DIAGNOSIS — Z9221 Personal history of antineoplastic chemotherapy: Secondary | ICD-10-CM

## 2018-01-13 DIAGNOSIS — R079 Chest pain, unspecified: Secondary | ICD-10-CM

## 2018-01-13 HISTORY — DX: Pleural effusion, not elsewhere classified: J90

## 2018-01-13 LAB — CBC WITH DIFFERENTIAL/PLATELET
ABS IMMATURE GRANULOCYTES: 0.03 10*3/uL (ref 0.00–0.07)
BASOS ABS: 0 10*3/uL (ref 0.0–0.1)
BASOS PCT: 1 %
EOS ABS: 0.1 10*3/uL (ref 0.0–0.5)
EOS PCT: 2 %
HCT: 35.6 % — ABNORMAL LOW (ref 36.0–46.0)
Hemoglobin: 10.7 g/dL — ABNORMAL LOW (ref 12.0–15.0)
Immature Granulocytes: 0 %
LYMPHS PCT: 12 %
Lymphs Abs: 0.9 10*3/uL (ref 0.7–4.0)
MCH: 27.3 pg (ref 26.0–34.0)
MCHC: 30.1 g/dL (ref 30.0–36.0)
MCV: 90.8 fL (ref 80.0–100.0)
MONO ABS: 0.7 10*3/uL (ref 0.1–1.0)
MONOS PCT: 9 %
Neutro Abs: 5.7 10*3/uL (ref 1.7–7.7)
Neutrophils Relative %: 76 %
PLATELETS: 428 10*3/uL — AB (ref 150–400)
RBC: 3.92 MIL/uL (ref 3.87–5.11)
RDW: 18.1 % — AB (ref 11.5–15.5)
WBC: 7.5 10*3/uL (ref 4.0–10.5)
nRBC: 0 % (ref 0.0–0.2)

## 2018-01-13 LAB — COMPREHENSIVE METABOLIC PANEL
ALBUMIN: 3.2 g/dL — AB (ref 3.5–5.0)
ALT: 16 U/L (ref 0–44)
AST: 21 U/L (ref 15–41)
Alkaline Phosphatase: 61 U/L (ref 38–126)
Anion gap: 6 (ref 5–15)
BUN: 16 mg/dL (ref 6–20)
CHLORIDE: 107 mmol/L (ref 98–111)
CO2: 27 mmol/L (ref 22–32)
CREATININE: 0.9 mg/dL (ref 0.44–1.00)
Calcium: 8.8 mg/dL — ABNORMAL LOW (ref 8.9–10.3)
GFR calc Af Amer: >60 mL/min (ref >60)
GFR calc non Af Amer: >60 mL/min (ref >60)
GLUCOSE: 154 mg/dL — AB (ref 70–99)
POTASSIUM: 4.2 mmol/L (ref 3.5–5.1)
Sodium: 140 mmol/L (ref 135–145)
Total Bilirubin: 0.7 mg/dL (ref 0.3–1.2)
Total Protein: 6.5 g/dL (ref 6.5–8.1)

## 2018-01-13 LAB — PROTEIN, PLEURAL OR PERITONEAL FLUID: Total protein, fluid: <3 g/dL

## 2018-01-13 LAB — EXPECTORATED SPUTUM ASSESSMENT W REFEX TO RESP CULTURE

## 2018-01-13 LAB — STREP PNEUMONIAE URINARY ANTIGEN: Strep Pneumo Urinary Antigen: NEGATIVE

## 2018-01-13 LAB — EXPECTORATED SPUTUM ASSESSMENT W GRAM STAIN, RFLX TO RESP C

## 2018-01-13 LAB — LACTATE DEHYDROGENASE, PLEURAL OR PERITONEAL FLUID: LD, Fluid: 82 U/L — ABNORMAL HIGH (ref 3–23)

## 2018-01-13 LAB — BODY FLUID CELL COUNT WITH DIFFERENTIAL
EOS FL: 0 %
LYMPHS FL: 22 %
MONOCYTE-MACROPHAGE-SEROUS FLUID: 76 % (ref 50–90)
NEUTROPHIL FLUID: 2 % (ref 0–25)
Total Nucleated Cell Count, Fluid: 579 cu mm (ref 0–1000)

## 2018-01-13 LAB — GLUCOSE, CAPILLARY
Glucose-Capillary: 70 mg/dL (ref 70–99)
Glucose-Capillary: 171 mg/dL — ABNORMAL HIGH (ref 70–99)

## 2018-01-13 LAB — GRAM STAIN

## 2018-01-13 MED ORDER — OXYCODONE-ACETAMINOPHEN 5-325 MG PO TABS
1.0000 | ORAL_TABLET | Freq: Three times a day (TID) | ORAL | Status: DC | PRN
  Administered 2018-01-13 – 2018-01-16 (×6): 1 via ORAL
  Filled 2018-01-13 (×6): qty 1

## 2018-01-13 MED ORDER — INSULIN PUMP
Freq: Three times a day (TID) | SUBCUTANEOUS | Status: DC
  Administered 2018-01-13: 18:00:00 via SUBCUTANEOUS
  Administered 2018-01-14: 4 via SUBCUTANEOUS
  Administered 2018-01-14: 2.3 via SUBCUTANEOUS
  Administered 2018-01-14: 3 via SUBCUTANEOUS
  Administered 2018-01-15: 2.3 via SUBCUTANEOUS
  Administered 2018-01-15: 1.7 via SUBCUTANEOUS
  Administered 2018-01-15: 0.6 via SUBCUTANEOUS
  Administered 2018-01-15: 1.7 via SUBCUTANEOUS
  Administered 2018-01-15: 1.9 via SUBCUTANEOUS
  Administered 2018-01-16: 08:00:00 via SUBCUTANEOUS
  Filled 2018-01-13: qty 1

## 2018-01-13 MED ORDER — GUAIFENESIN-DM 100-10 MG/5ML PO SYRP
5.0000 mL | ORAL_SOLUTION | ORAL | Status: DC | PRN
  Administered 2018-01-13 – 2018-01-14 (×3): 5 mL via ORAL
  Filled 2018-01-13 (×3): qty 10

## 2018-01-13 MED ORDER — SODIUM CHLORIDE 0.9 % IV SOLN
1.0000 g | Freq: Three times a day (TID) | INTRAVENOUS | Status: DC
  Administered 2018-01-13 – 2018-01-16 (×9): 1 g via INTRAVENOUS
  Filled 2018-01-13 (×11): qty 1

## 2018-01-13 MED ORDER — LORAZEPAM 0.5 MG PO TABS: 0.5000 mg | ORAL_TABLET | Freq: Four times a day (QID) | ORAL | Status: DC | PRN

## 2018-01-13 MED ORDER — VANCOMYCIN HCL 10 G IV SOLR
1750.0000 mg | INTRAVENOUS | Status: DC
  Administered 2018-01-14: 1750 mg via INTRAVENOUS
  Filled 2018-01-13: qty 1750

## 2018-01-13 MED ORDER — ACETAMINOPHEN 500 MG PO TABS: 1000.0000 mg | ORAL_TABLET | Freq: Four times a day (QID) | ORAL | Status: DC | PRN

## 2018-01-13 MED ORDER — VENLAFAXINE HCL ER 75 MG PO CP24: 75.0000 mg | ORAL_CAPSULE | Freq: Every day | ORAL | Status: DC

## 2018-01-13 MED ORDER — AZITHROMYCIN 250 MG PO TABS
500.0000 mg | ORAL_TABLET | Freq: Every day | ORAL | Status: DC
  Administered 2018-01-13 – 2018-01-16 (×4): 500 mg via ORAL
  Filled 2018-01-13 (×4): qty 2

## 2018-01-13 MED ORDER — VANCOMYCIN HCL 10 G IV SOLR
1500.0000 mg | Freq: Once | INTRAVENOUS | Status: AC
  Administered 2018-01-13: 1500 mg via INTRAVENOUS
  Filled 2018-01-13: qty 1500

## 2018-01-13 MED ORDER — MELOXICAM 7.5 MG PO TABS
7.5000 mg | ORAL_TABLET | Freq: Two times a day (BID) | ORAL | Status: DC
  Administered 2018-01-13 – 2018-01-16 (×6): 7.5 mg via ORAL
  Filled 2018-01-13 (×7): qty 1

## 2018-01-13 MED ORDER — LIDOCAINE HCL 1 % IJ SOLN
INTRAMUSCULAR | Status: AC
  Filled 2018-01-13: qty 10

## 2018-01-13 MED ORDER — MELOXICAM 7.5 MG PO TABS: 7.5000 mg | ORAL_TABLET | Freq: Every day | ORAL | Status: DC

## 2018-01-13 MED ORDER — VENLAFAXINE HCL ER 75 MG PO CP24
75.0000 mg | ORAL_CAPSULE | Freq: Every day | ORAL | Status: DC
  Administered 2018-01-13 – 2018-01-15 (×3): 75 mg via ORAL
  Filled 2018-01-13 (×3): qty 1

## 2018-01-13 MED ORDER — SODIUM CHLORIDE 0.9 % IV SOLN
INTRAVENOUS | Status: DC | PRN
  Administered 2018-01-13: 1000 mL via INTRAVENOUS

## 2018-01-13 MED ORDER — GUAIFENESIN ER 600 MG PO TB12
1200.0000 mg | ORAL_TABLET | Freq: Two times a day (BID) | ORAL | Status: DC
  Administered 2018-01-13 – 2018-01-16 (×6): 1200 mg via ORAL
  Filled 2018-01-13 (×6): qty 2

## 2018-01-13 NOTE — Assessment & Plan Note (Signed)
06/05/2017:Bilateral mastectomies: Right mastectomy: IDC grade 1, 3 foci 1.2 cm, 1.2 cm, 1 cm, IgA DCIS, LVH diffuse present, margins negative, 4/7 lymph nodes positive with extracapsular extension, ER 95%, PR 95%, HER-2 negative, Ki-67 2% to 10%, T1 cN2 aM0 stage II a pathological stage; left mastectomy: Benign  Mammaprint done preoperatively: Low risk Axillary lymph node dissection 06/26/2017: 2/22 lymph nodes positive(making a total of 6 lymph nodes that were positive)  Treatment plan: 1.Because ofher high risk features (6 LN Pos),werecommended adjuvant systemic chemotherapywithdose dense Adriamycin and Cytoxan x4 followed by Taxol weekly x12  07/22/2017-12/09/2017 3.Followed by radiation.  4.Followed by adjuvant antiestrogen therapy  UPBEAT clinical trial ([id number]):No toxicities related to the trial --------------------------------------------------------------------------- Acute shortness of breath: CT chest: 01/12/2018: No PE, patchy consolidation in both lungs groundglass opacities, differential infection versus radiation pneumonitis, right pleural effusion moderate to large, left pleural effusion moderate  Radiology review: I discussed with the patient the CT scan results which seem to suggest that she has bilateral pneumonia. Patient went to the emergency room last night but they could not get her in because of high volumes so she had to leave.  Plan: Patient is currently on second round of antibiotics. I recommended that we admit the patient to the hospital for healthcare associated pneumonia. I will request the hospitalist to admit her.

## 2018-01-13 NOTE — H&P (Signed)
History and Physical    Stephanie Holden:[id number] DOB: 26-May-1975 DOA: 01/13/2018  I have briefly reviewed the patient's prior medical records in [place]  PCP: Tisovec, Fransico Him, MD  Patient coming from: Cancer center  Chief Complaint: Progressive shortness of breath  HPI: Stephanie Holden is a 42 y.o. female with medical history significant of breast cancer status post bilateral mastectomy earlier this year, with positive lymph nodes, followed by adjuvant chemotherapy with Adriamycin and Cytoxan followed by Taxol, completed chemotherapy in September, currently undergoing adjuvant radiation therapy, who presents to the hospital from the cancer center due to shortness of breath.  She has been having progressively worsening shortness of breath and chest discomfort for the past couple of weeks.  She was evaluated by outpatient by PCP, received a chest x-ray which showed concern for pneumonia and was placed on Levaquin.  She completed a course, however continued to feel significantly short of breath, was reevaluated in the PCPs office and underwent a CT angiogram yesterday.  That did not show any PE however it did show patchy consolidations in bilateral lungs right greater than left along with moderate to large right-sided pleural effusion.  She was called and was directed to the emergency room, however patient had to wait a long time and decided to leave before being seen.  She presented today to oncology clinic and she was direct admitted.  She denies any fever or chills, she complains of a mild nonproductive cough.  She denies any abdominal pain, nausea or vomiting.  Hospitalist was asked to admit for H CAP  Review of Systems: As per HPI otherwise 10 point review of systems negative.   Past Medical History:  Diagnosis Date  . Cancer [facility])    Breast cancer - right  . Constipation   . Depression   . Genetic testing 05/27/2017   Breast/GYN panel (23 genes) @ [facility] - No pathogenic  mutations detected  . GERD (gastroesophageal reflux disease)   . Insulin dependent type 1 diabetes mellitus ([place])   . Insulin pump in place   . PONV (postoperative nausea and vomiting)   . Stenosing tenosynovitis of thumb 06/2014   right  . Thyroid goiter     Past Surgical History:  Procedure Laterality Date  . AXILLARY LYMPH NODE DISSECTION Right 06/26/2017   Procedure: AXILLARY LYMPH NODE DISSECTION;  Surgeon: [name], MD;  Location: [place];  Service: General;  Laterality: Right;  . CARPAL TUNNEL RELEASE Bilateral 10/28/2013   Procedure: BILATERAL CARPAL TUNNEL RELEASE;  Surgeon: Daryll Brod, MD;  Location: [place];  Service: Orthopedics;  Laterality: Bilateral;  . CESAREAN SECTION  2000; 08/20/2001  . ESSURE TUBAL LIGATION  2005   failed, tubal puncture  . LAPAROSCOPIC TUBAL LIGATION  04/02/2007   removal [place]  . LAPAROSCOPIC UNILATERAL SALPINGECTOMY  2005  . MASTECTOMY W/ SENTINEL NODE BIOPSY Bilateral 06/05/2017   Procedure: BILATERAL MASTECTOMIES  WITH RIGHT SENTINEL LYMPH NODE BIOPSY;  Surgeon: [name], MD;  Location: [place];  Service: General;  Laterality: Bilateral;  . PORTACATH PLACEMENT Left 06/26/2017   Procedure: INSERTION PORT-A-CATH;  Surgeon: [name], MD;  Location: [place];  Service: General;  Laterality: Left;  . TRIGGER FINGER RELEASE Left 04/12/2014   Procedure: RELEASE A-1 PULLEY LEFT THUMB;  Surgeon: Daryll Brod, MD;  Location: [place];  Service: Orthopedics;  Laterality: Left;  . TRIGGER FINGER RELEASE Right 07/19/2014   Procedure: RELEASE TRIGGER FINGER/A-1 PULLEY RIGHT THUMB;  Surgeon:  Daryll Brod, MD;  Location: [place];  Service: Orthopedics;  Laterality: Right;     reports that she quit smoking about 16 years ago. She has never used smokeless tobacco. She reports that she drank alcohol. She reports that she does not use drugs.  Allergies  Allergen Reactions  . Dilaudid [Hydromorphone Hcl] Itching and  Other (See Comments)    UNCONTROLLABLE CRYING    Family History  Problem Relation Age of Onset  . Hypertension Mother   . Hypertension Father   . Breast cancer Other 66       paternal grandfather's niece    Prior to Admission medications   Medication Sig Start Date End Date Taking? Authorizing Provider  acetaminophen (TYLENOL) 500 MG tablet Take 1,000 mg by mouth every 6 (six) hours as needed for moderate pain or headache.   Yes [provider]  albuterol (PROVENTIL HFA;VENTOLIN HFA) 108 (90 Base) MCG/ACT inhaler Inhale 1-2 puffs into the lungs every 6 (six) hours as needed for wheezing or shortness of breath.   Yes [provider]  azithromycin (ZITHROMAX) 250 MG tablet Take 250 mg by mouth daily.   Yes [provider]  cefdinir (OMNICEF) 300 MG capsule Take 300 mg by mouth 2 (two) times daily.   Yes [provider]  fluticasone furoate-vilanterol (BREO ELLIPTA) 200-25 MCG/INH AEPB Inhale 1 puff into the lungs daily.   Yes [provider]  hyaluronate sodium (RADIAPLEXRX) GEL Apply 1 application topically once.   Yes [provider]  insulin aspart (NOVOLOG) 100 UNIT/ML injection Inject 45 Units into the skin See admin instructions. Use via insulin pump up to 45 units daily   Yes [provider]  LORazepam (ATIVAN) 0.5 MG tablet TAKE 1 TABLET BY MOUTH EVERY 6 HOURS AS NEEDED FOR ANXIETY OR SLEEP Patient taking differently: Take 0.5 mg by mouth every 6 (six) hours as needed for anxiety or sleep.  12/23/17  Yes Nicholas Lose, MD  meloxicam (MOBIC) 7.5 MG tablet Take 7.5 mg by mouth daily.   Yes [provider]  oxyCODONE-acetaminophen (PERCOCET/ROXICET) 5-325 MG tablet Take 1 tablet by mouth every 8 (eight) hours as needed for severe pain. 12/22/17  Yes Nicholas Lose, MD  Probiotic Product (PROBIOTIC DAILY PO) Take 1 capsule by mouth at bedtime.    Yes [provider]  venlafaxine XR (EFFEXOR-XR) 75 MG 24 hr  capsule Take 1 capsule (75 mg total) by mouth daily with breakfast. 11/18/17  Yes Nicholas Lose, MD  lidocaine-prilocaine (EMLA) cream Apply to affected area once Patient not taking: Reported on 01/01/2018 06/20/17   Nicholas Lose, MD  ondansetron (ZOFRAN) 8 MG tablet Take 1 tablet (8 mg total) by mouth 2 (two) times daily as needed. Start on the third day after chemotherapy. Patient not taking: Reported on 01/01/2018 06/20/17   Nicholas Lose, MD    Physical Exam: Vitals:   01/13/18 1413 01/13/18 1603 01/13/18 1609 01/13/18 1615  BP: 122/82 137/87 (!) 137/92 125/88  Pulse: (!) 103     Resp: 18     Temp: 97.7 F (36.5 C)     TempSrc: Oral     SpO2: 95%       Constitutional: NAD, calm, comfortable Eyes: PERRL, lids and conjunctivae normal ENMT: Mucous membranes are moist. Posterior pharynx clear of any exudate or lesions. Neck: normal, supple, no masses, no thyromegaly Respiratory: Decreased breath sounds at the bases, no wheezing, no crackles, normal respiratory effort without accessory muscle use Cardiovascular: Regular rate and  rhythm, no murmurs / rubs / gallops. No extremity edema. 2+ pedal pulses.  Abdomen: no tenderness, no masses palpated. Bowel sounds positive.  Musculoskeletal: no clubbing / cyanosis. Normal muscle tone.  Skin: no rashes, lesions, ulcers. No induration Neurologic: CN 2-12 grossly intact. Strength 5/5 in all 4.  Psychiatric: Normal judgment and insight. Alert and oriented x 3. Normal mood.   Labs on Admission: I have personally reviewed following labs and imaging studies  CBC: Recent Labs  Lab 01/13/18 1424  WBC 7.5  NEUTROABS 5.7  HGB 10.7*  HCT 35.6*  MCV 90.8  PLT 361*   Basic Metabolic Panel: Recent Labs  Lab 01/13/18 1424  NA 140  K 4.2  CL 107  CO2 27  GLUCOSE 154*  BUN 16  CREATININE 0.90  CALCIUM 8.8*   GFR: Estimated Creatinine Clearance: 79.3 mL/min (by C-G formula based on SCr of 0.9 mg/dL). Liver Function Tests: Recent Labs    Lab 01/13/18 1424  AST 21  ALT 16  ALKPHOS 61  BILITOT 0.7  PROT 6.5  ALBUMIN 3.2*   No results for input(s): LIPASE, AMYLASE in the last 168 hours. No results for input(s): AMMONIA in the last 168 hours. Coagulation Profile: No results for input(s): INR, PROTIME in the last 168 hours. Cardiac Enzymes: No results for input(s): CKTOTAL, CKMB, CKMBINDEX, TROPONINI in the last 168 hours. BNP (last 3 results) No results for input(s): PROBNP in the last 8760 hours. HbA1C: No results for input(s): HGBA1C in the last 72 hours. CBG: No results for input(s): GLUCAP in the last 168 hours. Lipid Profile: No results for input(s): CHOL, HDL, LDLCALC, TRIG, CHOLHDL, LDLDIRECT in the last 72 hours. Thyroid Function Tests: No results for input(s): TSH, T4TOTAL, FREET4, T3FREE, THYROIDAB in the last 72 hours. Anemia Panel: No results for input(s): VITAMINB12, FOLATE, FERRITIN, TIBC, IRON, RETICCTPCT in the last 72 hours. Urine analysis:    Component Value Date/Time   COLORURINE YELLOW 12/02/2017 1030   APPEARANCEUR HAZY (A) 12/02/2017 1030   LABSPEC 1.023 12/02/2017 1030   PHURINE 5.0 12/02/2017 1030   GLUCOSEU >=500 (A) 12/02/2017 1030   HGBUR MODERATE (A) 12/02/2017 1030   BILIRUBINUR NEGATIVE 12/02/2017 1030   BILIRUBINUR n 09/06/2014 0845   KETONESUR NEGATIVE 12/02/2017 1030   PROTEINUR NEGATIVE 12/02/2017 1030   UROBILINOGEN negative 09/06/2014 0845   NITRITE NEGATIVE 12/02/2017 1030   LEUKOCYTESUR MODERATE (A) 12/02/2017 1030     Radiological Exams on Admission: Ct Angio Chest Pe W Or Wo Contrast  Result Date: 01/12/2018 CLINICAL DATA:  Shortness of breath, heavy chest pain. Recent diagnosis of pneumonia, on antibiotics. History of RIGHT breast cancer in 2019 status post bilateral mastectomies. Current radiation therapy. EXAM: CT ANGIOGRAPHY CHEST WITH CONTRAST TECHNIQUE: Multidetector CT imaging of the chest was performed using the standard protocol during bolus  administration of intravenous contrast. Multiplanar CT image reconstructions and MIPs were obtained to evaluate the vascular anatomy. CONTRAST:  92mL ISOVUE-370 IOPAMIDOL (ISOVUE-370) INJECTION 76% COMPARISON:  Chest CT dated 05/21/2017. FINDINGS: Cardiovascular: There is no pulmonary embolism identified within the main, lobar or segmental pulmonary arteries bilaterally. No thoracic aortic aneurysm or evidence of aortic dissection. Heart size is normal. No pericardial effusion. Mediastinum/Nodes: No mass or enlarged lymph nodes seen within the mediastinum or perihilar regions. Esophagus is unremarkable. Trachea and central bronchi are unremarkable. Lungs/Pleura: Patchy consolidations within both lungs, involving all lobes, RIGHT greater than LEFT, some components with ground-glass opacities. RIGHT pleural effusion, moderate to large in size. LEFT pleural effusion, moderate  in size. Upper Abdomen: Limited images of the upper abdomen are unremarkable. Musculoskeletal: No acute or suspicious osseous finding. Review of the MIP images confirms the above findings. IMPRESSION: 1. No pulmonary embolism. 2. Patchy consolidations throughout both lungs, involving all lobes, RIGHT greater than LEFT, some components with ground glass opacities. Differential includes atypical pneumonias such as viral or fungal, interstitial pneumonias, edema related to volume overload/CHF, chronic interstitial diseases, hypersensitivity pneumonitis, and respiratory bronchiolitis. Given the history of current radiation, findings could also represent radiation induced pneumonitis or a combination of pneumonia and radiation pneumonitis. 3. RIGHT pleural effusion, moderate to large in size. 4. LEFT pleural effusion, moderate in size. Electronically Signed   By: Franki Cabot M.D.   On: 01/12/2018 14:13   US Thoracentesis Asp Pleural Space W/img Guide  Result Date: 01/13/2018 INDICATION: Patient with history of breast cancer, dyspnea, bilateral  pleural effusions right greater than left. Request received for diagnostic and therapeutic right thoracentesis EXAM: ULTRASOUND GUIDED DIAGNOSTIC AND THERAPEUTIC RIGHT THORACENTESIS MEDICATIONS: None COMPLICATIONS: None immediate. PROCEDURE: An ultrasound guided thoracentesis was thoroughly discussed with the patient and questions answered. The benefits, risks, alternatives and complications were also discussed. The patient understands and wishes to proceed with the procedure. Written consent was obtained. Ultrasound was performed to localize and mark an adequate pocket of fluid in the right chest. The area was then prepped and draped in the normal sterile fashion. 1% Lidocaine was used for local anesthesia. Under ultrasound guidance a 6 Fr Safe-T-Centesis catheter was introduced. Thoracentesis was performed. The catheter was removed and a dressing applied. FINDINGS: A total of approximately 930 cc of slightly hazy, yellow fluid was removed. Samples were sent to the laboratory as requested by the clinical team. IMPRESSION: Successful ultrasound guided diagnostic and therapeutic right thoracentesis yielding 930 cc of pleural fluid. Read by: Rowe Robert, PA-C Electronically Signed   By: Jerilynn Mages.  Shick M.D.   On: 01/13/2018 16:29    Assessment/Plan Active Problems:   Diabetes type 1, controlled ([place])   Malignant neoplasm of axillary tail of right breast in female, estrogen receptor positive ([place])   HCAP (healthcare-associated pneumonia)   Pleural effusion   Presumed HCAP with bilateral pleural effusion, R>L -Based on the CT scan, place patient on broad-spectrum antibiotics, vancomycin and cefepime, add azithromycin to cover atypicals.  She did however receive full course of Levaquin prior to this -Possible that increased pleural effusion made her feel worse rather than her pneumonia getting worse -will closely monitor cultures, narrow antibiotics as clinically indicated -Send cytology, Gram stain, cell count  from thoracentesis  Type 1 diabetes mellitus -On insulin pump, continue pump while hospitalized.  Consulted diabetes coordinator  Breast cancer -Managed as an outpatient by Dr. Sonny Dandy, currently undergoing radiation therapy   DVT prophylaxis: SCDs, switch to Lovenox following Thoracentesis   Code Status: Full code  Family Communication: family at bedside Disposition Plan: home when ready  Consults called: none     Admission status: observation   At the point of initial evaluation, it is my clinical opinion that admission for OBSERVATION is reasonable and necessary because the patient's presenting complaints in the context of their chronic conditions represent sufficient risk of deterioration or significant morbidity to constitute reasonable grounds for close observation in the hospital setting, but that the patient may be medically stable for discharge from the hospital within 24 to 48 hours.    Marzetta Board, MD [facility] Pager [phone]  If 7PM-7AM, please contact night-coverage [email or web address] Password [email or web address]  01/13/2018, 4:56 PM

## 2018-01-13 NOTE — Progress Notes (Signed)
Patient Care Team: Tisovec, Fransico Him, MD as PCP - General (Internal Medicine)  DIAGNOSIS:  Encounter Diagnosis  Name Primary?  . Malignant neoplasm of axillary tail of right breast in female, estrogen receptor positive ([place])     SUMMARY OF ONCOLOGIC HISTORY:   Malignant neoplasm of axillary tail of right breast in female, estrogen receptor positive ([place])   05/09/2017 Initial Diagnosis    May 2018: Palpable right axillary mass.  The recommended a 6-week follow-up but apparently the mass decreased in size.  Recent increase in the mass was noted, biopsy revealed Grade 1 IDC with DCIS, lymphovascular invasion present, 4 small hypoechoic masses largest 0.9 cm, T1BN1 stage Ib clinical stage    06/05/2017 Surgery    Bilateral mastectomies: Right mastectomy: IDC grade 1, 3 foci 1.2 cm, 1.2 cm, 1 cm, IgA DCIS, LVH diffuse present, margins negative, 4/7 lymph nodes positive with extracapsular extension, ER 95%, PR 95%, HER-2 negative, Ki-67 2% to 10%, T1 cN2 aM0 stage II a pathological stage; left mastectomy: Benign    06/26/2017 Surgery    Axillary lymph node dissection: 2/22 lymph nodes positive (making a total of 6+ lymph nodes)    07/22/2017 - 12/09/2017 Chemotherapy    Adjuvant chemotherapy with dose dense Adriamycin and Cytoxan x4 followed by Taxol weekly x12     12/23/2017 Cancer Staging    Staging form: Breast, AJCC 8th Edition - Pathologic: Stage IB (pT1c, pN2a, cM0, G1, ER+, PR+, HER2-) - Signed by Eppie Gibson, MD on 12/23/2017    12/30/2017 -  Radiation Therapy    Adjuvant radiation    01/12/2018 Imaging    No PE, patchy consolidation in both lungs groundglass opacities, differential infection versus radiation pneumonitis, right pleural effusion moderate to large, left pleural effusion moderate     Breast cancer metastasized to axillary lymph node, right ([place])   06/05/2017 Initial Diagnosis    Breast cancer metastasized to axillary lymph node, right ([place])    07/22/2017 -   Chemotherapy    Adjuvant chemotherapy with dose dense Adriamycin and Cytoxan x4 followed by Taxol weekly x12      CHIEF COMPLIANT: Acute shortness of breath with CT chest showing bilateral lung infiltrates  INTERVAL HISTORY: Stephanie Holden is a 42 year old with above-mentioned history of right breast cancer who had bilateral mastectomies followed by adjuvant chemotherapy that was completed on 12/09/2017.  She is currently on adjuvant radiation therapy since 12/30/2017.  Patient started to have progressively worsening shortness of breath and chest discomfort.  She saw her primary care physician who obtained a CT of the chest that did not show PE but showed patchy consolidations in both lungs which are concerning.  I discussed with the patient yesterday and instructed her to go to the emergency room last night.  Unfortunately at the emergency room there was a large volume of patient's and she could not get in for several hours.  Even after she got into informed her that there are no beds available and that she may not get admitted last night.  So she decided to leave the emergency room.  She is here this morning to undergo radiation therapy.  I asked her to come to my office to be seen. She complains of shortness of breath to minimal exertion.  She denies any fevers or chills.  REVIEW OF SYSTEMS:   Constitutional: Denies fevers, chills or abnormal weight loss Eyes: Denies blurriness of vision Ears, nose, mouth, throat, and face: Denies mucositis or sore throat Respiratory: Denies  cough, dyspnea or wheezes Cardiovascular: Denies palpitation, chest discomfort Gastrointestinal:  Denies nausea, heartburn or change in bowel habits Skin: Denies abnormal skin rashes Lymphatics: Denies new lymphadenopathy or easy bruising Neurological:Denies numbness, tingling or new weaknesses Behavioral/Psych: Mood is stable, no new changes  Extremities: No lower extremity edema   All other systems were reviewed with  the patient and are negative.  I have reviewed the past medical history, past surgical history, social history and family history with the patient and they are unchanged from previous note.  ALLERGIES:  is allergic to dilaudid [hydromorphone hcl].  MEDICATIONS:  Current Outpatient Medications  Medication Sig Dispense Refill  . acetaminophen (TYLENOL) 500 MG tablet Take 1,000 mg by mouth every 6 (six) hours as needed for moderate pain or headache.    . insulin aspart (NOVOLOG) 100 UNIT/ML injection Inject 45 Units into the skin See admin instructions. Use via insulin pump up to 45 units daily    . lidocaine-prilocaine (EMLA) cream Apply to affected area once (Patient not taking: Reported on 01/01/2018) 30 g 3  . LORazepam (ATIVAN) 0.5 MG tablet TAKE 1 TABLET BY MOUTH EVERY 6 HOURS AS NEEDED FOR ANXIETY OR SLEEP 30 tablet 0  . ondansetron (ZOFRAN) 8 MG tablet Take 1 tablet (8 mg total) by mouth 2 (two) times daily as needed. Start on the third day after chemotherapy. (Patient not taking: Reported on 01/01/2018) 30 tablet 1  . oxyCODONE-acetaminophen (PERCOCET/ROXICET) 5-325 MG tablet Take 1 tablet by mouth every 8 (eight) hours as needed for severe pain. 60 tablet 0  . Probiotic Product (PROBIOTIC DAILY PO) Take 1 capsule by mouth at bedtime.     Marland Kitchen venlafaxine XR (EFFEXOR-XR) 75 MG 24 hr capsule Take 1 capsule (75 mg total) by mouth daily with breakfast. 30 capsule 3   No current facility-administered medications for this visit.     PHYSICAL EXAMINATION: ECOG PERFORMANCE STATUS: 1 - Symptomatic but completely ambulatory  Vitals:   01/13/18 0902  BP: 133/89  Pulse: 99  Resp: 19  Temp: 98 F (36.7 C)  SpO2: 95%   Filed Weights   01/13/18 0902  Weight: 171 lb 4.8 oz (77.7 kg)    GENERAL:alert, no distress and comfortable SKIN: skin color, texture, turgor are normal, no rashes or significant lesions EYES: normal, Conjunctiva are pink and non-injected, sclera clear OROPHARYNX:no  exudate, no erythema and lips, buccal mucosa, and tongue normal  NECK: supple, thyroid normal size, non-tender, without nodularity LYMPH:  no palpable lymphadenopathy in the cervical, axillary or inguinal LUNGS: clear to auscultation and percussion with normal breathing effort HEART: regular rate & rhythm and no murmurs and no lower extremity edema ABDOMEN:abdomen soft, non-tender and normal bowel sounds MUSCULOSKELETAL:no cyanosis of digits and no clubbing  NEURO: alert & oriented x 3 with fluent speech, no focal motor/sensory deficits EXTREMITIES: No lower extremity edema     LABORATORY DATA:  I have reviewed the data as listed CMP Latest Ref Rng & Units 12/09/2017 12/02/2017 11/25/2017  Glucose 70 - 99 mg/dL 173(H) 252(H) 163(H)  BUN 6 - 20 mg/dL _0 Creatinine 0.44 - 1.00 mg/dL 0.90 0.93 0.84  Sodium 135 - 145 mmol/L 141 141 141  Potassium 3.5 - 5.1 mmol/L 4.4 4.5 4.2  Chloride 98 - 111 mmol/L 105 104 104  CO2 22 - 32 mmol/L _1 Calcium 8.9 - 10.3 mg/dL 9.4 9.2 9.5  Total Protein 6.5 - 8.1 g/dL 7.0 7.0 7.2  Total Bilirubin 0.3 - 1.2  mg/dL 0.5 0.5 0.5  Alkaline Phos 38 - 126 U/L 76 84 86  AST 15 - 41 U/L _0 ALT 0 - 44 U/L _1 Lab Results  Component Value Date   WBC 2.7 (L) 12/09/2017   HGB 10.7 (L) 12/09/2017   HCT 33.4 (L) 12/09/2017   MCV 92.5 12/09/2017   PLT 358 12/09/2017   NEUTROABS 1.6 12/09/2017    ASSESSMENT & PLAN:  Malignant neoplasm of axillary tail of right breast in female, estrogen receptor positive ([place]) 06/05/2017:Bilateral mastectomies: Right mastectomy: IDC grade 1, 3 foci 1.2 cm, 1.2 cm, 1 cm, IgA DCIS, LVH diffuse present, margins negative, 4/7 lymph nodes positive with extracapsular extension, ER 95%, PR 95%, HER-2 negative, Ki-67 2% to 10%, T1 cN2 aM0 stage II a pathological stage; left mastectomy: Benign  Mammaprint done preoperatively: Low risk Axillary lymph node dissection 06/26/2017: 2/22 lymph nodes positive(making a  total of 6 lymph nodes that were positive)  Treatment plan: 1.Because ofher high risk features (6 LN Pos),werecommended adjuvant systemic chemotherapywithdose dense Adriamycin and Cytoxan x4 followed by Taxol weekly x12  07/22/2017-12/09/2017 3.Followed by radiation.  4.Followed by adjuvant antiestrogen therapy  UPBEAT clinical trial ([id number]):No toxicities related to the trial --------------------------------------------------------------------------- Acute shortness of breath: CT chest: 01/12/2018: No PE, patchy consolidation in both lungs groundglass opacities, differential infection versus radiation pneumonitis, right pleural effusion moderate to large, left pleural effusion moderate  Radiology review: I discussed with the patient the CT scan results which seem to suggest that she has bilateral pneumonia. Patient went to the emergency room last night but they could not get her in because of high volumes so she had to leave.  Plan: Patient is currently on second round of antibiotics. I recommended that we admit the patient to the hospital for healthcare associated pneumonia. I will request the hospitalist to admit her.  No orders of the defined types were placed in this encounter.  The patient has a good understanding of the overall plan. she agrees with it. she will call with any problems that may develop before the next visit here.   Harriette Ohara, MD 01/13/18

## 2018-01-13 NOTE — Progress Notes (Signed)
Pharmacy Antibiotic Note  Stephanie Holden is a 42 y.o. female admitted on 01/13/2018 with pneumonia.  She is currently undergoing radiation for breast cancer.  CT chest concerning for PNA vs radiation pneumonitis.  She completed course of Levaquin without improvement.  Presented yesterday with worsening shortness of breath & chest discomfort.   Pharmacy has been consulted for Vancomycin dosing.  Cefepime also started per MD.   01/13/2018:  Afebrile  WBC WNL (7.5)  Scr 0.9, est CrCl ~48ml/min  Plan: Vancomycin 1500mg  IV x1 now then 1750mg  IV q24h (target AUC 400-500) Cefepime 1gm IV q8h per MD Monitor renal function and cx data      Temp (24hrs), Avg:98 F (36.7 C), Min:97.7 F (36.5 C), Max:98.2 F (36.8 C)  No results for input(s): WBC, CREATININE, LATICACIDVEN, VANCOTROUGH, VANCOPEAK, VANCORANDOM, GENTTROUGH, GENTPEAK, GENTRANDOM, TOBRATROUGH, TOBRAPEAK, TOBRARND, AMIKACINPEAK, AMIKACINTROU, AMIKACIN in the last 168 hours.  CrCl cannot be calculated (Patient's most recent lab result is older than the maximum 21 days allowed.).    Allergies  Allergen Reactions  . Dilaudid [Hydromorphone Hcl] Itching and Other (See Comments)    UNCONTROLLABLE CRYING    Antimicrobials this admission: 10/22 Cefepime >>  10/22 Vancomycin >>   Dose adjustments this admission:  Microbiology results: 10/22 BCx:  10/22 Sputum:   MRSA PCR  Thank you for allowing pharmacy to be a part of this patient's care.  Biagio Borg 01/13/2018 2:21 PM

## 2018-01-13 NOTE — Procedures (Signed)
Ultrasound-guided diagnostic and therapeutic right thoracentesis performed yielding 930 cc of slightly hazy, yellow fluid. No immediate complications. Follow-up chest x-ray pending.The fluid was sent to the lab for preordered studies.

## 2018-01-13 NOTE — Progress Notes (Addendum)
Inpatient Diabetes Program Recommendations  AACE/ADA: New Consensus Statement on Inpatient Glycemic Control (2015)  Target Ranges:  Prepandial:   less than 140 mg/dL      Peak postprandial:   less than 180 mg/dL (1-2 hours)      Critically ill patients:  140 - 180 mg/dL   Lab Results  Component Value Date   GLUCAP 194 (H) 06/26/2017   HGBA1C 6.9 (H) 06/24/2017    Review of Glycemic Control  Diabetes history: DM1 Outpatient Diabetes medications: Insulin pump Current orders for Inpatient glycemic control: Insulin pump   PCP: Tisovic To get pump settings in am.  Briefly spoke with pt regarding her insulin pump. Has Dexcom CGM. Discussed using hospital glucose meter to make her corrections. Pt voiced understanding.  CBGs to be checked tidwc, hs, and 0200.  Will follow.  Thank you. Lorenda Peck, RD, LDN, CDE Inpatient Diabetes Coordinator [phone] Addendum: Outpatient Diabetes medications:  Insulin pump-670 G-  Total basal:  22.1 units/day 12MN-5a-0.8 units/hr 5a-1p- 0.975 units/hr 1p-5p-1 unit/hr 5p-12a-0.9 units/hr  I:C ratio-1 unit/12 g CHO Correction factor- 1 unit drops blood sugars approximately 55 mg/dL

## 2018-01-14 ENCOUNTER — Telehealth: Payer: Self-pay | Admitting: Hematology and Oncology

## 2018-01-14 ENCOUNTER — Ambulatory Visit
Admission: RE | Admit: 2018-01-14 | Discharge: 2018-01-14 | Disposition: A | Discharge status: Home / Self Care | Payer: BLUE CROSS/BLUE SHIELD | Source: Ambulatory Visit | Attending: Radiation Oncology | Admitting: Radiation Oncology

## 2018-01-14 ENCOUNTER — Inpatient Hospital Stay (HOSPITAL_COMMUNITY): Payer: BLUE CROSS/BLUE SHIELD

## 2018-01-14 DIAGNOSIS — R0602 Shortness of breath: Secondary | ICD-10-CM | POA: Diagnosis present

## 2018-01-14 DIAGNOSIS — Z923 Personal history of irradiation: Secondary | ICD-10-CM | POA: Diagnosis not present

## 2018-01-14 DIAGNOSIS — Z9221 Personal history of antineoplastic chemotherapy: Secondary | ICD-10-CM | POA: Diagnosis not present

## 2018-01-14 DIAGNOSIS — Z9641 Presence of insulin pump (external) (internal): Secondary | ICD-10-CM | POA: Diagnosis present

## 2018-01-14 DIAGNOSIS — J189 Pneumonia, unspecified organism: Principal | ICD-10-CM

## 2018-01-14 DIAGNOSIS — C50919 Malignant neoplasm of unspecified site of unspecified female breast: Secondary | ICD-10-CM

## 2018-01-14 DIAGNOSIS — Z170 Estrogen receptor positive status [ER+]: Secondary | ICD-10-CM | POA: Diagnosis not present

## 2018-01-14 DIAGNOSIS — Z794 Long term (current) use of insulin: Secondary | ICD-10-CM | POA: Diagnosis not present

## 2018-01-14 DIAGNOSIS — J90 Pleural effusion, not elsewhere classified: Secondary | ICD-10-CM | POA: Diagnosis not present

## 2018-01-14 DIAGNOSIS — Y95 Nosocomial condition: Secondary | ICD-10-CM | POA: Diagnosis present

## 2018-01-14 DIAGNOSIS — C50611 Malignant neoplasm of axillary tail of right female breast: Secondary | ICD-10-CM | POA: Diagnosis not present

## 2018-01-14 DIAGNOSIS — E109 Type 1 diabetes mellitus without complications: Secondary | ICD-10-CM | POA: Diagnosis present

## 2018-01-14 LAB — GLUCOSE, CAPILLARY
GLUCOSE-CAPILLARY: 123 mg/dL — AB (ref 70–99)
GLUCOSE-CAPILLARY: 175 mg/dL — AB (ref 70–99)
Glucose-Capillary: 161 mg/dL — ABNORMAL HIGH (ref 70–99)
Glucose-Capillary: 148 mg/dL — ABNORMAL HIGH (ref 70–99)
Glucose-Capillary: 118 mg/dL — ABNORMAL HIGH (ref 70–99)

## 2018-01-14 LAB — HIV ANTIBODY (ROUTINE TESTING W REFLEX): HIV Screen 4th Generation wRfx: NONREACTIVE

## 2018-01-14 MED ORDER — VANCOMYCIN HCL 10 G IV SOLR
1250.0000 mg | INTRAVENOUS | Status: DC
  Administered 2018-01-15 – 2018-01-16 (×2): 1250 mg via INTRAVENOUS
  Filled 2018-01-14 (×2): qty 1250

## 2018-01-14 NOTE — Progress Notes (Signed)
PROGRESS NOTE    Stephanie Holden  TTS:[id number] DOB: [birth year] DOA: 01/13/2018 PCP: Haywood Pao, MD   Brief Narrative:  Patient is a 42 year old female with past medical history of breast cancer status post bilateral mastectomy earlier this year, positive lymph nodes, on chemotherapy with Adriamycin and Cytoxan followed by Taxol which she completed on September, currently undergoing adjuvant radiation therapy who presented to the hospital from the cancer center due to shortness of breath.  She was also treated for community acquired pneumonia as an outpatient with little improvement .  CT angiogram showed consolidation in bilateral lungs more on the right and moderate to large right-sided pleural effusion.  Patient is currently being managed for healthcare associated pneumonia.Oncology also following.  Assessment & Plan:   Active Problems:   Diabetes type 1, controlled ([place])   Malignant neoplasm of axillary tail of right breast in female, estrogen receptor positive ([place])   HCAP (healthcare-associated pneumonia)   Pleural effusion  Presumed healthcare associated pneumonia: Started on broad-spectrum antibiotics.  On vancomycin , cefepime and azithromycin.  Based on the recent CT scan, it showed bilateral consolidation more on the right.  We will closely monitor her.  Currently she is not in respiratory distress.  She is saturating fine on room air.  We will follow-up cultures.  Will narrow the antibiotics as appropriate. Chest x-ray to be repeated today.  Moderate to large right pleural effusion: Underwent thoracentesis.  We will follow-up Gram stain, cytology, culture.  Needs to rule out metastatic breast cancer.  Diabetes type 1: On insulin pump at home.  Diabetes currently following.  Breast cancer: Follows with Dr. Sonny Holden as an outpatient.  Currently undergoing radiation therapy.   DVT prophylaxis: SCD Code Status: Full Family Communication: family members present at the bed  side Disposition Plan: Home after full work-up , improvement in the respiratory status   Consultants: Oncology  Procedures:Thoracentesis  Antimicrobials: Vancomycin and cefepime  Subjective: Patient seen and examined the bedside this morning.  Does not look like in respiratory distress.  Currently saturating fine on room air.  She thinks that her respiratory improvement is slow.  Denies any chest pain.  Objective: Vitals:   01/13/18 1615 01/13/18 2113 01/14/18 0635 01/14/18 1329  BP: 125/88 134/86 140/83 119/79  Pulse:  (!) 123 (!) 116 (!) 106  Resp:  19 18 15   Temp:  99.1 F (37.3 C) 99.2 F (37.3 C) (!) 97.5 F (36.4 C)  TempSrc:  Oral Oral Oral  SpO2:  93% 92% 92%    Intake/Output Summary (Last 24 hours) at 01/14/2018 1354 Last data filed at 01/14/2018 0500 Gross per 24 hour  Intake 309.03 ml  Output -  Net 309.03 ml   There were no vitals filed for this visit.  Examination:  General exam: Appears calm and comfortable ,Not in distress,average built HEENT:PERRL,Oral mucosa moist, Ear/Nose normal on gross exam Respiratory system: Bilateral decreased entry, crackles in the bases Cardiovascular system: S1 & S2 heard, RRR. No JVD, murmurs, rubs, gallops or clicks. No pedal edema.  Chemo-Port on the left chest. Gastrointestinal system: Abdomen is nondistended, soft and nontender. No organomegaly or masses felt. Normal bowel sounds heard. Central nervous system: Alert and oriented. No focal neurological deficits. Extremities: No edema, no clubbing ,no cyanosis, distal peripheral pulses palpable. Skin: No rashes, lesions or ulcers,no icterus ,no pallor MSK: Normal muscle bulk,tone ,power Psychiatry: Judgement and insight appear normal. Mood & affect appropriate.     Data Reviewed: I have personally reviewed following  labs and imaging studies  CBC: Recent Labs  Lab 01/13/18 1424  WBC 7.5  NEUTROABS 5.7  HGB 10.7*  HCT 35.6*  MCV 90.8  PLT 428*   Basic  Metabolic Panel: Recent Labs  Lab 01/13/18 1424  NA 140  K 4.2  CL 107  CO2 27  GLUCOSE 154*  BUN 16  CREATININE 0.90  CALCIUM 8.8*   GFR: Estimated Creatinine Clearance: 79.3 mL/min (by C-G formula based on SCr of 0.9 mg/dL). Liver Function Tests: Recent Labs  Lab 01/13/18 1424  AST 21  ALT 16  ALKPHOS 61  BILITOT 0.7  PROT 6.5  ALBUMIN 3.2*   No results for input(s): LIPASE, AMYLASE in the last 168 hours. No results for input(s): AMMONIA in the last 168 hours. Coagulation Profile: No results for input(s): INR, PROTIME in the last 168 hours. Cardiac Enzymes: No results for input(s): CKTOTAL, CKMB, CKMBINDEX, TROPONINI in the last 168 hours. BNP (last 3 results) No results for input(s): PROBNP in the last 8760 hours. HbA1C: No results for input(s): HGBA1C in the last 72 hours. CBG: Recent Labs  Lab 01/13/18 1743 01/13/18 2118 01/14/18 0238 01/14/18 0749 01/14/18 1201  GLUCAP 70 171* 123* 161* 148*   Lipid Profile: No results for input(s): CHOL, HDL, LDLCALC, TRIG, CHOLHDL, LDLDIRECT in the last 72 hours. Thyroid Function Tests: No results for input(s): TSH, T4TOTAL, FREET4, T3FREE, THYROIDAB in the last 72 hours. Anemia Panel: No results for input(s): VITAMINB12, FOLATE, FERRITIN, TIBC, IRON, RETICCTPCT in the last 72 hours. Sepsis Labs: No results for input(s): PROCALCITON, LATICACIDVEN in the last 168 hours.  Recent Results (from the past 240 hour(s))  Culture, sputum-assessment     Status: None   Collection Time: 01/13/18  2:12 PM  Result Value Ref Range Status   Specimen Description SPUTUM  Final   Special Requests NONE  Final   Sputum evaluation   Final    THIS SPECIMEN IS ACCEPTABLE FOR SPUTUM CULTURE Performed at [facility], [address], [place], [place] [address]    Report Status 01/13/2018 FINAL  Final  Culture, respiratory     Status: None (Preliminary result)   Collection Time: 01/13/18  2:12 PM  Result Value Ref  Range Status   Specimen Description   Final    SPUTUM Performed at [facility] [address], [place], [place] [address]    Special Requests   Final    NONE Reflexed from [phone] Performed at [facility], [place] [address] [place], [place] [address]    Gram Stain   Final    MODERATE WBC PRESENT,BOTH PMN AND MONONUCLEAR FEW GRAM POSITIVE COCCI FEW GRAM VARIABLE ROD Performed at [facility], [place] [address] [place], [place] [address]    Culture PENDING  Incomplete   Report Status PENDING  Incomplete  Culture, blood (routine x 2) Call MD if unable to obtain prior to antibiotics being given     Status: None (Preliminary result)   Collection Time: 01/13/18  2:24 PM  Result Value Ref Range Status   Specimen Description   Final    BLOOD RIGHT ANTECUBITAL Performed at [facility] [address] [place], [place] [address]    Special Requests   Final    BOTTLES DRAWN AEROBIC AND ANAEROBIC Blood Culture adequate volume Performed at [facility] [address], [place], [place] [address]    Culture   Final    NO GROWTH < 24  HOURS Performed at [facility], [place] [address], [place], [place] [address]    Report Status PENDING  Incomplete  Culture, blood (routine x 2) Call MD if unable to obtain prior to antibiotics being given     Status: None (Preliminary result)   Collection Time: 01/13/18  2:28 PM  Result Value Ref Range Status   Specimen Description   Final    BLOOD RIGHT HAND Performed at [facility] [address] [place], [place] [address]    Special Requests   Final    BOTTLES DRAWN AEROBIC ONLY Blood Culture adequate volume Performed at [facility] [address] [place], [place] [address]    Culture   Final    NO GROWTH < 24 HOURS Performed at [facility] [address], [place], [place] [address]    Report Status PENDING   Incomplete  Gram stain     Status: None   Collection Time: 01/13/18  4:16 PM  Result Value Ref Range Status   Specimen Description PLEURAL RIGHT  Final   Special Requests NONE  Final   Gram Stain   Final    RARE WBC PRESENT, PREDOMINANTLY MONONUCLEAR NO ORGANISMS SEEN Performed at [facility], [address] [place], [place] [address]    Report Status 01/13/2018 FINAL  Final  Culture, body fluid-bottle     Status: None (Preliminary result)   Collection Time: 01/13/18  4:16 PM  Result Value Ref Range Status   Specimen Description PLEURAL RIGHT  Final   Special Requests NONE  Final   Culture   Final    NO GROWTH < 24 HOURS Performed at [facility], [place] [address], [place], [place] [address]    Report Status PENDING  Incomplete         Radiology Studies: Dg Chest 1 View  Result Date: 01/13/2018 CLINICAL DATA:  Breast cancer.  Followup right thoracentesis. EXAM: CHEST  1 VIEW COMPARISON:  Chest CT 01/12/2018. FINDINGS: No visible pleural fluid on either side on this AP projection. No pneumothorax. Bilateral airspace filling worse on the right than the left consistent with edema or pneumonia. Power port unchanged with its tip in the SVC. IMPRESSION: No visible pleural effusions. No pneumothorax. Persistent bilateral alveolar filling shadows right more than left. Electronically Signed   By: Nelson Chimes M.D.   On: 01/13/2018 16:54   Ct Angio Chest Pe W Or Wo Contrast  Result Date: 01/12/2018 CLINICAL DATA:  Shortness of breath, heavy chest pain. Recent diagnosis of pneumonia, on antibiotics. History of RIGHT breast cancer in 2019 status post bilateral mastectomies. Current radiation therapy. EXAM: CT ANGIOGRAPHY CHEST WITH CONTRAST TECHNIQUE: Multidetector CT imaging of the chest was performed using the standard protocol during bolus administration of intravenous contrast. Multiplanar CT image reconstructions and MIPs were obtained to evaluate the vascular anatomy.  CONTRAST:  55mL ISOVUE-370 IOPAMIDOL (ISOVUE-370) INJECTION 76% COMPARISON:  Chest CT dated 05/21/2017. FINDINGS: Cardiovascular: There is no pulmonary embolism identified within the main, lobar or segmental pulmonary arteries bilaterally. No thoracic aortic aneurysm or evidence of aortic dissection. Heart size is normal. No pericardial effusion. Mediastinum/Nodes: No mass or enlarged lymph nodes seen within the mediastinum or perihilar regions. Esophagus is unremarkable. Trachea and central bronchi are unremarkable. Lungs/Pleura: Patchy consolidations within both lungs, involving all lobes, RIGHT greater than LEFT, some components with ground-glass opacities. RIGHT pleural effusion, moderate to large in size. LEFT pleural effusion, moderate in size. Upper  Abdomen: Limited images of the upper abdomen are unremarkable. Musculoskeletal: No acute or suspicious osseous finding. Review of the MIP images confirms the above findings. IMPRESSION: 1. No pulmonary embolism. 2. Patchy consolidations throughout both lungs, involving all lobes, RIGHT greater than LEFT, some components with ground glass opacities. Differential includes atypical pneumonias such as viral or fungal, interstitial pneumonias, edema related to volume overload/CHF, chronic interstitial diseases, hypersensitivity pneumonitis, and respiratory bronchiolitis. Given the history of current radiation, findings could also represent radiation induced pneumonitis or a combination of pneumonia and radiation pneumonitis. 3. RIGHT pleural effusion, moderate to large in size. 4. LEFT pleural effusion, moderate in size. Electronically Signed   By: Franki Cabot M.D.   On: 01/12/2018 14:13   US Thoracentesis Asp Pleural Space W/img Guide  Result Date: 01/13/2018 INDICATION: Patient with history of breast cancer, dyspnea, bilateral pleural effusions right greater than left. Request received for diagnostic and therapeutic right thoracentesis EXAM: ULTRASOUND GUIDED  DIAGNOSTIC AND THERAPEUTIC RIGHT THORACENTESIS MEDICATIONS: None COMPLICATIONS: None immediate. PROCEDURE: An ultrasound guided thoracentesis was thoroughly discussed with the patient and questions answered. The benefits, risks, alternatives and complications were also discussed. The patient understands and wishes to proceed with the procedure. Written consent was obtained. Ultrasound was performed to localize and mark an adequate pocket of fluid in the right chest. The area was then prepped and draped in the normal sterile fashion. 1% Lidocaine was used for local anesthesia. Under ultrasound guidance a 6 Fr Safe-T-Centesis catheter was introduced. Thoracentesis was performed. The catheter was removed and a dressing applied. FINDINGS: A total of approximately 930 cc of slightly hazy, yellow fluid was removed. Samples were sent to the laboratory as requested by the clinical team. IMPRESSION: Successful ultrasound guided diagnostic and therapeutic right thoracentesis yielding 930 cc of pleural fluid. Read by: Rowe Robert, PA-C Electronically Signed   By: Jerilynn Mages.  Shick M.D.   On: 01/13/2018 16:29        Scheduled Meds: . azithromycin  500 mg Oral Daily  . guaiFENesin  1,200 mg Oral BID  . insulin pump   Subcutaneous TID AC, HS, 0200  . meloxicam  7.5 mg Oral BID  . venlafaxine XR  75 mg Oral QHS   Continuous Infusions: . sodium chloride 10 mL/hr at 01/14/18 0500  . ceFEPime (MAXIPIME) IV 1 g (01/14/18 0748)  . vancomycin 1,750 mg (01/14/18 0935)     LOS: 1 day    Time spent: 35 mins.More than 50% of that time was spent in counseling and/or coordination of care.      Shelly Coss, MD [facility] Pager [phone]  If 7PM-7AM, please contact night-coverage [email or web address] Password [email or web address] 01/14/2018, 1:54 PM

## 2018-01-14 NOTE — Progress Notes (Signed)
HEMATOLOGY-ONCOLOGY PROGRESS NOTE  SUBJECTIVE: Status post thoracentesis yesterday.  She had immediate remarkable improvement in breathing but then since then she has been having progressively worsening cough.  Denies any fevers or chills.  She is currently on broad-spectrum antibiotics with cefepime and vancomycin.  OBJECTIVE: REVIEW OF SYSTEMS:   Constitutional: Fatigue Eyes: Denies blurriness of vision Ears, nose, mouth, throat, and face: Denies mucositis or sore throat Respiratory: Cough nonproductive, shortness of breath Cardiovascular: Denies palpitation, chest discomfort Gastrointestinal:  Denies nausea, heartburn or change in bowel habits Skin: Denies abnormal skin rashes Lymphatics: Denies new lymphadenopathy or easy bruising Neurological:Denies numbness, tingling or new weaknesses Behavioral/Psych: Mood is stable, no new changes  Extremities: No lower extremity edema   All other systems were reviewed with the patient and are negative.  I have reviewed the past medical history, past surgical history, social history and family history with the patient and they are unchanged from previous note.   PHYSICAL EXAMINATION: ECOG PERFORMANCE STATUS: 1 - Symptomatic but completely ambulatory  Vitals:   01/13/18 2113 01/14/18 0635  BP: 134/86 140/83  Pulse: (!) 123 (!) 116  Resp: 19 18  Temp: 99.1 F (37.3 C) 99.2 F (37.3 C)  SpO2: 93% 92%   There were no vitals filed for this visit.  GENERAL:alert, no distress and comfortable SKIN: skin color, texture, turgor are normal, no rashes or significant lesions EYES: normal, Conjunctiva are pink and non-injected, sclera clear OROPHARYNX:no exudate, no erythema and lips, buccal mucosa, and tongue normal  NECK: supple, thyroid normal size, non-tender, without nodularity LYMPH:  no palpable lymphadenopathy in the cervical, axillary or inguinal LUNGS: Bilateral inspiratory and expiratory crackles HEART: regular rate & rhythm and no  murmurs and no lower extremity edema ABDOMEN:abdomen soft, non-tender and normal bowel sounds Musculoskeletal:no cyanosis of digits and no clubbing  NEURO: alert & oriented x 3 with fluent speech, no focal motor/sensory deficits  LABORATORY DATA:  I have reviewed the data as listed CMP Latest Ref Rng & Units 01/13/2018 12/09/2017 12/02/2017  Glucose 70 - 99 mg/dL 154(H) 173(H) 252(H)  BUN 6 - 20 mg/dL 16 10 9   Creatinine 0.44 - 1.00 mg/dL 0.90 0.90 0.93  Sodium 135 - 145 mmol/L 140 141 141  Potassium 3.5 - 5.1 mmol/L 4.2 4.4 4.5  Chloride 98 - 111 mmol/L 107 105 104  CO2 22 - 32 mmol/L 27 26 27   Calcium 8.9 - 10.3 mg/dL 8.8(L) 9.4 9.2  Total Protein 6.5 - 8.1 g/dL 6.5 7.0 7.0  Total Bilirubin 0.3 - 1.2 mg/dL 0.7 0.5 0.5  Alkaline Phos 38 - 126 U/L 61 76 84  AST 15 - 41 U/L 21 24 24   ALT 0 - 44 U/L 16 20 25     Lab Results  Component Value Date   WBC 7.5 01/13/2018   HGB 10.7 (L) 01/13/2018   HCT 35.6 (L) 01/13/2018   MCV 90.8 01/13/2018   PLT 428 (H) 01/13/2018   NEUTROABS 5.7 01/13/2018    ASSESSMENT AND PLAN: 1.  Bilateral pneumonia: Currently on cefepime and vancomycin. Because of bilateral inspiratory and expiratory crackles, I recommended that we do a chest x-ray today. I asked the nurse to bring incentive spirometry for her. Lab results suggest that the pleural fluid is exudative. Microscopy does not reveal any bacterial infection. Cytology is pending.  2. breast cancer: Completed chemotherapy about a month ago and is currently on radiation.

## 2018-01-14 NOTE — Progress Notes (Signed)
Pharmacy Antibiotic Note  Stephanie Holden is a 42 y.o. female admitted on 01/13/2018 with pneumonia.  Pharmacy has been consulted for vancomycin dosing.  Patient recently completed a 7 day course of levofloxacin outpatient. Currently prescribed azithromycin, cefepime, and vancomycin on admission.  Today, 01/14/18  WBC 7.5 yesterday, no new labs  SCr 0.9, CrCl ~75 mL/min  Afebrile  Cefepime 1 g IV q8h per MD  Plan:  Will reduce vancomycin dose to 1250 mg IV q24h for goal AUC 400-500  Monitor cultures, renal function, and vancomycin levels as necessary.     Temp (24hrs), Avg:98.4 F (36.9 C), Min:97.5 F (36.4 C), Max:99.2 F (37.3 C)  Recent Labs  Lab 01/13/18 1424  WBC 7.5  CREATININE 0.90    Estimated Creatinine Clearance: 79.3 mL/min (by C-G formula based on SCr of 0.9 mg/dL).    Allergies  Allergen Reactions  . Dilaudid [Hydromorphone Hcl] Itching and Other (See Comments)    UNCONTROLLABLE CRYING    Antimicrobials this admission: vancomycin 10/22 >>  cefepime 10/22 >>  Azithromycin 10/22 >>  Dose adjustments this admission: 10/23 vancomycin 1750 q24h --> 1250 q24h  Microbiology results: 10/22 BCx: ngtd 10/22 Sputum: Pending   Thank you for allowing pharmacy to be a part of this patient's care.  Lenis Noon, PharmD Clinical Pharmacist 01/14/2018 1:55 PM

## 2018-01-14 NOTE — Telephone Encounter (Signed)
Per 10/22 no los 

## 2018-01-15 ENCOUNTER — Ambulatory Visit: Payer: BLUE CROSS/BLUE SHIELD

## 2018-01-15 ENCOUNTER — Ambulatory Visit
Admission: RE | Admit: 2018-01-15 | Discharge: 2018-01-15 | Disposition: A | Discharge status: Home / Self Care | Payer: BLUE CROSS/BLUE SHIELD | Source: Ambulatory Visit | Attending: Radiation Oncology | Admitting: Radiation Oncology

## 2018-01-15 DIAGNOSIS — C50611 Malignant neoplasm of axillary tail of right female breast: Secondary | ICD-10-CM | POA: Diagnosis not present

## 2018-01-15 LAB — GLUCOSE, CAPILLARY
GLUCOSE-CAPILLARY: 173 mg/dL — AB (ref 70–99)
Glucose-Capillary: 246 mg/dL — ABNORMAL HIGH (ref 70–99)
Glucose-Capillary: 200 mg/dL — ABNORMAL HIGH (ref 70–99)
Glucose-Capillary: 166 mg/dL — ABNORMAL HIGH (ref 70–99)
Glucose-Capillary: 200 mg/dL — ABNORMAL HIGH (ref 70–99)

## 2018-01-15 LAB — CREATININE, SERUM
Creatinine, Ser: 0.83 mg/dL (ref 0.44–1.00)
GFR calc non Af Amer: >60 mL/min (ref >60)

## 2018-01-15 LAB — LEGIONELLA PNEUMOPHILA SEROGP 1 UR AG: L. pneumophila Serogp 1 Ur Ag: NEGATIVE

## 2018-01-15 LAB — PH, BODY FLUID: pH, Body Fluid: 7.4

## 2018-01-15 MED ORDER — ALTEPLASE 2 MG IJ SOLR
2.0000 mg | Freq: Once | INTRAMUSCULAR | Status: AC
  Administered 2018-01-15: 2 mg
  Filled 2018-01-15: qty 2

## 2018-01-15 MED ORDER — SODIUM CHLORIDE 0.9% FLUSH: 10.0000 mL | INTRAVENOUS | Status: DC | PRN

## 2018-01-15 NOTE — Progress Notes (Addendum)
PROGRESS NOTE    Stephanie Holden  TKW:[id number] DOB: 22-Mar-1976 DOA: 01/13/2018 PCP: Haywood Pao, MD   Brief Narrative:  Patient is a 42 year old female with past medical history of breast cancer status post bilateral mastectomy earlier this year, positive lymph nodes, on chemotherapy with Adriamycin and Cytoxan followed by Taxol which she completed on September, currently undergoing adjuvant radiation therapy who presented to the hospital from the cancer center due to shortness of breath.  She was also treated for community acquired pneumonia as an outpatient with little improvement .  CT angiogram showed consolidation in bilateral patchy lungs consolidation more on the right and moderate to large right-sided pleural effusion.  Patient is currently being managed for healthcare associated pneumonia.Oncology also following.  Assessment & Plan:   Active Problems:   Diabetes type 1, controlled ([place])   Malignant neoplasm of axillary tail of right breast in female, estrogen receptor positive ([place])   HCAP (healthcare-associated pneumonia)   Pleural effusion  Presumed healthcare associated pneumonia: Started on broad-spectrum antibiotics.  On vancomycin , cefepime and azithromycin.  Based on the recent CT scan, it showed bilateral consolidation more on the right.  We will closely monitor her.  Currently she is not in respiratory distress.  She is saturating fine on room air.  We will follow-up cultures.  Will narrow the antibiotics as appropriate. Chest x-ray to be repeated on 01/15/18 did not show any significant finding from last one. Patient still complains of shortness of breath .But her lungs are more clear today.  We will request the nurse to ambulate her.  Moderate to large right pleural effusion: Underwent thoracentesis.  We will follow-up cytology, culture.  Gram stain did not show any organisms, showed predominantly mononuclear cells.  Needs to rule out metastatic breast  cancer.  Diabetes type 1: On insulin pump at home.  Diabetes currently following.  Breast cancer: Follows with Dr. Sonny Dandy as an outpatient.  Currently undergoing radiation therapy.   DVT prophylaxis: SCD Code Status: Full Family Communication: None present at the bed side Disposition Plan: Likely home tomorrow with oral antibiotics  Consultants: Oncology  Procedures:Thoracentesis  Antimicrobials: Vancomycin and cefepime  Subjective: Patient seen and examined the bedside this morning.  Does not look like in respiratory distress.  Currently saturating fine on room air.  She thinks she still has shortness of breath.  Denies any chest pain.  Objective: Vitals:   01/14/18 0635 01/14/18 1329 01/14/18 2112 01/15/18 0500  BP: 140/83 119/79 112/81 121/86  Pulse: (!) 116 (!) 106 (!) 116 (!) 101  Resp: 18 15 18 17   Temp: 99.2 F (37.3 C) (!) 97.5 F (36.4 C) 99.2 F (37.3 C) 98.4 F (36.9 C)  TempSrc: Oral Oral Oral Oral  SpO2: 92% 92% 95% 98%    Intake/Output Summary (Last 24 hours) at 01/15/2018 1118 Last data filed at 01/15/2018 0900 Gross per 24 hour  Intake 760.96 ml  Output -  Net 760.96 ml   There were no vitals filed for this visit.  Examination:  General exam: Appears calm and comfortable ,Not in distress,average built HEENT:PERRL,Oral mucosa moist, Ear/Nose normal on gross exam Respiratory system: Bilateral mostly clear, no crackles or wheezes  cardiovascular system: S1 & S2 heard, RRR. No JVD, murmurs, rubs, gallops or clicks. No pedal edema.  Chemo-Port on the left chest. Gastrointestinal system: Abdomen is nondistended, soft and nontender. No organomegaly or masses felt. Normal bowel sounds heard. Central nervous system: Alert and oriented. No focal neurological deficits. Extremities: No  edema, no clubbing ,no cyanosis, distal peripheral pulses palpable. Skin: No rashes, lesions or ulcers,no icterus ,no pallor MSK: Normal muscle bulk,tone ,power Psychiatry:  Judgement and insight appear normal. Mood & affect appropriate.     Data Reviewed: I have personally reviewed following labs and imaging studies  CBC: Recent Labs  Lab 01/13/18 1424  WBC 7.5  NEUTROABS 5.7  HGB 10.7*  HCT 35.6*  MCV 90.8  PLT 102*   Basic Metabolic Panel: Recent Labs  Lab 01/13/18 1424 01/15/18 0648  NA 140  --   K 4.2  --   CL 107  --   CO2 27  --   GLUCOSE 154*  --   BUN 16  --   CREATININE 0.90 0.83  CALCIUM 8.8*  --    GFR: Estimated Creatinine Clearance: 86 mL/min (by C-G formula based on SCr of 0.83 mg/dL). Liver Function Tests: Recent Labs  Lab 01/13/18 1424  AST 21  ALT 16  ALKPHOS 61  BILITOT 0.7  PROT 6.5  ALBUMIN 3.2*   No results for input(s): LIPASE, AMYLASE in the last 168 hours. No results for input(s): AMMONIA in the last 168 hours. Coagulation Profile: No results for input(s): INR, PROTIME in the last 168 hours. Cardiac Enzymes: No results for input(s): CKTOTAL, CKMB, CKMBINDEX, TROPONINI in the last 168 hours. BNP (last 3 results) No results for input(s): PROBNP in the last 8760 hours. HbA1C: No results for input(s): HGBA1C in the last 72 hours. CBG: Recent Labs  Lab 01/14/18 1201 01/14/18 1717 01/14/18 2114 01/15/18 0207 01/15/18 0748  GLUCAP 148* 118* 175* 246* 200*   Lipid Profile: No results for input(s): CHOL, HDL, LDLCALC, TRIG, CHOLHDL, LDLDIRECT in the last 72 hours. Thyroid Function Tests: No results for input(s): TSH, T4TOTAL, FREET4, T3FREE, THYROIDAB in the last 72 hours. Anemia Panel: No results for input(s): VITAMINB12, FOLATE, FERRITIN, TIBC, IRON, RETICCTPCT in the last 72 hours. Sepsis Labs: No results for input(s): PROCALCITON, LATICACIDVEN in the last 168 hours.  Recent Results (from the past 240 hour(s))  Culture, sputum-assessment     Status: None   Collection Time: 01/13/18  2:12 PM  Result Value Ref Range Status   Specimen Description SPUTUM  Final   Special Requests NONE  Final    Sputum evaluation   Final    THIS SPECIMEN IS ACCEPTABLE FOR SPUTUM CULTURE Performed at [facility], [place] [address] [place], [place] [address]    Report Status 01/13/2018 FINAL  Final  Culture, respiratory     Status: None (Preliminary result)   Collection Time: 01/13/18  2:12 PM  Result Value Ref Range Status   Specimen Description   Final    SPUTUM Performed at [facility] [address], [place], [place] [address]    Special Requests   Final    NONE Reflexed from [phone] Performed at [facility], [place] [address], [place], [place] [address]    Gram Stain   Final    MODERATE WBC PRESENT,BOTH PMN AND MONONUCLEAR FEW GRAM POSITIVE COCCI FEW GRAM VARIABLE ROD    Culture   Final    CULTURE REINCUBATED FOR BETTER GROWTH Performed at [facility], [place] [address] [place], [place] [address]    Report Status PENDING  Incomplete  Culture, blood (routine x 2) Call MD if unable to obtain prior to antibiotics being given     Status: None (Preliminary result)   Collection Time: 01/13/18  2:24 PM  Result  Value Ref Range Status   Specimen Description   Final    BLOOD RIGHT ANTECUBITAL Performed at [facility] [address] [place], [place] [address]    Special Requests   Final    BOTTLES DRAWN AEROBIC AND ANAEROBIC Blood Culture adequate volume Performed at [facility] [address], [place], [place] [address]    Culture   Final    NO GROWTH 2 DAYS Performed at [facility] [address], [place], [place] [address]    Report Status PENDING  Incomplete  Culture, blood (routine x 2) Call MD if unable to obtain prior to antibiotics being given     Status: None (Preliminary result)   Collection Time: 01/13/18  2:28 PM  Result Value Ref Range Status   Specimen Description   Final    BLOOD RIGHT HAND Performed at [facility] [address],  [place], [place] [address]    Special Requests   Final    BOTTLES DRAWN AEROBIC ONLY Blood Culture adequate volume Performed at [facility] [address], [place], [place] [address]    Culture   Final    NO GROWTH 2 DAYS Performed at [facility] [address] [place], [place] [address]    Report Status PENDING  Incomplete  Gram stain     Status: None   Collection Time: 01/13/18  4:16 PM  Result Value Ref Range Status   Specimen Description PLEURAL RIGHT  Final   Special Requests NONE  Final   Gram Stain   Final    RARE WBC PRESENT, PREDOMINANTLY MONONUCLEAR NO ORGANISMS SEEN Performed at [facility], [address], [place],  [address]    Report Status 01/13/2018 FINAL  Final  Culture, body fluid-bottle     Status: None (Preliminary result)   Collection Time: 01/13/18  4:16 PM  Result Value Ref Range Status   Specimen Description PLEURAL RIGHT  Final   Special Requests NONE  Final   Culture   Final    NO GROWTH 2 DAYS Performed at [facility] [address], [place],  [address]    Report Status PENDING  Incomplete         Radiology Studies: Dg Chest 1 View  Result Date: 01/13/2018 CLINICAL DATA:  Breast cancer.  Followup right thoracentesis. EXAM: CHEST  1 VIEW COMPARISON:  Chest CT 01/12/2018. FINDINGS: No visible pleural fluid on either side on this AP projection. No pneumothorax. Bilateral airspace filling worse on the right than the left consistent with edema or pneumonia. Power port unchanged with its tip in the SVC. IMPRESSION: No visible pleural effusions. No pneumothorax. Persistent bilateral alveolar filling shadows right more than left. Electronically Signed   By: Nelson Chimes M.D.   On: 01/13/2018 16:54   Dg Chest 2 View  Result Date: 01/14/2018 CLINICAL DATA:  Shortness of breath EXAM: CHEST - 2 VIEW COMPARISON:  01/13/2018, 10/21/2016, CT chest 01/12/2018 FINDINGS: Left-sided central venous port tip  over the SVC. Small bilateral pleural effusions. Stable cardiomediastinal silhouette. Similar appearance of right greater than left consolidations and ground-glass opacity. Clips in the right axilla. IMPRESSION: 1. Trace bilateral pleural effusions. 2. No significant interval change than right greater than left consolidations and ground-glass densities. Electronically Signed   By: Donavan Foil M.D.   On: 01/14/2018 15:51   US Thoracentesis Asp Pleural Space W/img Guide  Result Date: 01/13/2018 INDICATION: Patient with  history of breast cancer, dyspnea, bilateral pleural effusions right greater than left. Request received for diagnostic and therapeutic right thoracentesis EXAM: ULTRASOUND GUIDED DIAGNOSTIC AND THERAPEUTIC RIGHT THORACENTESIS MEDICATIONS: None COMPLICATIONS: None immediate. PROCEDURE: An ultrasound guided thoracentesis was thoroughly discussed with the patient and questions answered. The benefits, risks, alternatives and complications were also discussed. The patient understands and wishes to proceed with the procedure. Written consent was obtained. Ultrasound was performed to localize and mark an adequate pocket of fluid in the right chest. The area was then prepped and draped in the normal sterile fashion. 1% Lidocaine was used for local anesthesia. Under ultrasound guidance a 6 Fr Safe-T-Centesis catheter was introduced. Thoracentesis was performed. The catheter was removed and a dressing applied. FINDINGS: A total of approximately 930 cc of slightly hazy, yellow fluid was removed. Samples were sent to the laboratory as requested by the clinical team. IMPRESSION: Successful ultrasound guided diagnostic and therapeutic right thoracentesis yielding 930 cc of pleural fluid. Read by: Rowe Robert, PA-C Electronically Signed   By: Jerilynn Mages.  Shick M.D.   On: 01/13/2018 16:29        Scheduled Meds: . azithromycin  500 mg Oral Daily  . guaiFENesin  1,200 mg Oral BID  . insulin pump   Subcutaneous  TID AC, HS, 0200  . meloxicam  7.5 mg Oral BID  . venlafaxine XR  75 mg Oral QHS   Continuous Infusions: . sodium chloride 10 mL/hr at 01/14/18 0500  . ceFEPime (MAXIPIME) IV 1 g (01/15/18 0810)  . vancomycin 1,250 mg (01/15/18 1037)     LOS: 2 days    Time spent: 25 mins.More than 50% of that time was spent in counseling and/or coordination of care.      Shelly Coss, MD [facility] Pager [phone]  If 7PM-7AM, please contact night-coverage [email or web address] Password [email or web address] 01/15/2018, 11:18 AM

## 2018-01-15 NOTE — Progress Notes (Signed)
Inpatient Diabetes Program Recommendations  AACE/ADA: New Consensus Statement on Inpatient Glycemic Control (2015)  Target Ranges:  Prepandial:   less than 140 mg/dL      Peak postprandial:   less than 180 mg/dL (1-2 hours)      Critically ill patients:  140 - 180 mg/dL   Results for DEEDRA, PRO (MRN [id number]) as of 01/15/2018 09:59  Ref. Range 01/14/2018 02:38 01/14/2018 07:49 01/14/2018 12:01 01/14/2018 17:17 01/14/2018 21:14  Glucose-Capillary Latest Ref Range: 70 - 99 mg/dL 123 (H) 161 (H)  2.3 units per pump 148 (H)  4 units per pump 118 (H)  3 units per pump 175 (H)   Results for ZYIA, KANEKO (MRN [id number]) as of 01/15/2018 13:45  Ref. Range 01/15/2018 02:07 01/15/2018 07:48 01/15/2018 12:13  Glucose-Capillary Latest Ref Range: 70 - 99 mg/dL 246 (H)  1.9 units per pump 200 (H)  2.3 units per pump 173 (H)   Admit with: Pneumonia  History: Type 1 Diabetes  Home DM Meds: Insulin Pump  Current Orders: Insulin Pump     CBGs stable so far.  Likely home tomorrow per Dr. Earmon Phoenix notes.     --Will follow patient during hospitalization--  Wyn Quaker RN, MSN, CDE Diabetes Coordinator Inpatient Glycemic Control Team Team Pager: [phone] (8a-5p)

## 2018-01-15 NOTE — Progress Notes (Signed)
Port without blood return. TPA instilled x2 hours. Line assessed post TPA- remains without blood return. Patient agreeable for continued use of line without blood return. Primary care RN updated.

## 2018-01-16 ENCOUNTER — Ambulatory Visit
Admission: RE | Admit: 2018-01-16 | Discharge: 2018-01-16 | Disposition: A | Discharge status: Home / Self Care | Payer: BLUE CROSS/BLUE SHIELD | Source: Ambulatory Visit | Attending: Radiation Oncology | Admitting: Radiation Oncology

## 2018-01-16 DIAGNOSIS — C50611 Malignant neoplasm of axillary tail of right female breast: Secondary | ICD-10-CM | POA: Diagnosis not present

## 2018-01-16 LAB — CBC WITH DIFFERENTIAL/PLATELET
Abs Immature Granulocytes: 0.02 10*3/uL (ref 0.00–0.07)
Basophils Absolute: 0 10*3/uL (ref 0.0–0.1)
Basophils Relative: 1 %
EOS PCT: 4 %
Eosinophils Absolute: 0.3 10*3/uL (ref 0.0–0.5)
HCT: 37.7 % (ref 36.0–46.0)
Hemoglobin: 11.5 g/dL — ABNORMAL LOW (ref 12.0–15.0)
Immature Granulocytes: 0 %
LYMPHS ABS: 0.8 10*3/uL (ref 0.7–4.0)
Lymphocytes Relative: 11 %
MCH: 27.3 pg (ref 26.0–34.0)
MCHC: 30.5 g/dL (ref 30.0–36.0)
MCV: 89.3 fL (ref 80.0–100.0)
MONO ABS: 0.7 10*3/uL (ref 0.1–1.0)
MONOS PCT: 9 %
Neutro Abs: 5.7 10*3/uL (ref 1.7–7.7)
Neutrophils Relative %: 75 %
Platelets: 369 10*3/uL (ref 150–400)
RBC: 4.22 MIL/uL (ref 3.87–5.11)
RDW: 17.8 % — ABNORMAL HIGH (ref 11.5–15.5)
WBC: 7.5 10*3/uL (ref 4.0–10.5)
nRBC: 0 % (ref 0.0–0.2)

## 2018-01-16 LAB — BASIC METABOLIC PANEL
ANION GAP: 9 (ref 5–15)
BUN: 10 mg/dL (ref 6–20)
CALCIUM: 8.5 mg/dL — AB (ref 8.9–10.3)
CO2: 22 mmol/L (ref 22–32)
CREATININE: 0.7 mg/dL (ref 0.44–1.00)
Chloride: 109 mmol/L (ref 98–111)
GFR calc Af Amer: >60 mL/min (ref >60)
GFR calc non Af Amer: >60 mL/min (ref >60)
GLUCOSE: 211 mg/dL — AB (ref 70–99)
Potassium: 4.1 mmol/L (ref 3.5–5.1)
Sodium: 140 mmol/L (ref 135–145)

## 2018-01-16 LAB — GLUCOSE, CAPILLARY
GLUCOSE-CAPILLARY: 190 mg/dL — AB (ref 70–99)
Glucose-Capillary: 150 mg/dL — ABNORMAL HIGH (ref 70–99)
Glucose-Capillary: 168 mg/dL — ABNORMAL HIGH (ref 70–99)

## 2018-01-16 LAB — CULTURE, RESPIRATORY

## 2018-01-16 LAB — CULTURE, RESPIRATORY W GRAM STAIN: Culture: NORMAL

## 2018-01-16 MED ORDER — HEPARIN SOD (PORK) LOCK FLUSH 100 UNIT/ML IV SOLN
500.0000 [IU] | Freq: Once | INTRAVENOUS | Status: AC
  Administered 2018-01-16: 500 [IU] via INTRAVENOUS
  Filled 2018-01-16: qty 5

## 2018-01-16 MED ORDER — GUAIFENESIN-DM 100-10 MG/5ML PO SYRP: 5.0000 mL | ORAL_SOLUTION | ORAL | 0 refills | Status: DC | PRN

## 2018-01-16 MED ORDER — AMOXICILLIN-POT CLAVULANATE 875-125 MG PO TABS: 1.0000 | ORAL_TABLET | Freq: Three times a day (TID) | ORAL | 0 refills | Status: AC

## 2018-01-16 NOTE — Discharge Summary (Signed)
Physician Discharge Summary  Stephanie Holden:[id number] DOB: Oct 14, 1975 DOA: 01/13/2018  PCP: Haywood Pao, MD  Admit date: 01/13/2018 Discharge date: 01/16/2018  Admitted From: Home Disposition:  Home  Discharge Condition:Stable CODE STATUS:FULL Diet recommendation:  Carbohydrate consistent  Brief/Interim Summary: Patient is a 42 year old female with past medical history of breast cancer status post bilateral mastectomy earlier this year, positive lymph nodes, on chemotherapy with Adriamycin and Cytoxan followed by Taxol which she completed on September, currently undergoing adjuvant radiation therapy who presented to the hospital from the cancer center due to shortness of breath.  She was also treated for community acquired pneumonia as an outpatient with little improvement .  CT angiogram showed consolidation in bilateral patchy lungs consolidation more on the right and moderate to large right-sided pleural effusion.  Patient was currently being managed for healthcare associated pneumonia.Oncology also following.  She underwent right-sided thoracentesis.  Currently her respiratory status has improved.  Cultures remain negative.  IV antibiotics will be discontinued and she will be discharged home with oral antibiotics.  Following problems were addressed during her hospitalization:  Healthcare associated pneumonia: Started on broad-spectrum antibiotics.  On vancomycin , cefepime and azithromycin.  Based on the recent CT scan, it showed bilateral consolidation more on the right. Currently she is not in respiratory distress.  She is saturating fine on room air.  Negative cultures till date.    Antibiotics changed to oral.   Chest x-ray  repeated on 01/15/18 did not show any significant finding from last one. Patient still complains of mild shortness of breath .But her lungs are more clear today.  She ambulated on the hallway without decrease in saturation.  Moderate to large right  pleural effusion: Underwent thoracentesis. .  Gram stain did not show any organisms, showed predominantly mononuclear cells.  Culture no growth till date.  Cytology test just showed reactive  mesothelial cells.   Diabetes type 1: On insulin pump at home. Continue at home.  Breast cancer: Follows with Dr. Lindi Adie as an outpatient.  Currently undergoing radiation therapy.    Discharge Diagnoses:  Active Problems:   Diabetes type 1, controlled ([place])   Malignant neoplasm of axillary tail of right breast in female, estrogen receptor positive ([place])   HCAP (healthcare-associated pneumonia)   Pleural effusion    Discharge Instructions  Discharge Instructions    Diet - low sodium heart healthy   Complete by:  As directed    Discharge instructions   Complete by:  As directed    1) Take prescribed medications as instructed. 2) Follow up with your PCP in a week.  Do a CBC, BMP test during the. 3)Follow up with radiation oncology in the next appointment.   Increase activity slowly   Complete by:  As directed      Allergies as of 01/16/2018      Reactions   Dilaudid [hydromorphone Hcl] Itching, Other (See Comments)   UNCONTROLLABLE CRYING      Medication List    STOP taking these medications   azithromycin 250 MG tablet Commonly known as:  ZITHROMAX   cefdinir 300 MG capsule Commonly known as:  OMNICEF     TAKE these medications   acetaminophen 500 MG tablet Commonly known as:  TYLENOL Take 1,000 mg by mouth every 6 (six) hours as needed for moderate pain or headache.   albuterol 108 (90 Base) MCG/ACT inhaler Commonly known as:  PROVENTIL HFA;VENTOLIN HFA Inhale 1-2 puffs into the lungs every 6 (six) hours as  needed for wheezing or shortness of breath.   amoxicillin-clavulanate 875-125 MG tablet Commonly known as:  AUGMENTIN Take 1 tablet by mouth 3 (three) times daily for 3 days. Start taking on:  01/17/2018   BREO ELLIPTA 200-25 MCG/INH Aepb Generic drug:   fluticasone furoate-vilanterol Inhale 1 puff into the lungs daily.   guaiFENesin-dextromethorphan 100-10 MG/5ML syrup Commonly known as:  ROBITUSSIN DM Take 5 mLs by mouth every 4 (four) hours as needed for cough.   hyaluronate sodium Gel Apply 1 application topically once.   lidocaine-prilocaine cream Commonly known as:  EMLA Apply to affected area once   LORazepam 0.5 MG tablet Commonly known as:  ATIVAN TAKE 1 TABLET BY MOUTH EVERY 6 HOURS AS NEEDED FOR ANXIETY OR SLEEP What changed:  See the new instructions.   meloxicam 7.5 MG tablet Commonly known as:  MOBIC Take 7.5 mg by mouth daily.   NOVOLOG 100 UNIT/ML injection Generic drug:  insulin aspart Inject 45 Units into the skin See admin instructions. Use via insulin pump up to 45 units daily   ondansetron 8 MG tablet Commonly known as:  ZOFRAN Take 1 tablet (8 mg total) by mouth 2 (two) times daily as needed. Start on the third day after chemotherapy.   oxyCODONE-acetaminophen 5-325 MG tablet Commonly known as:  PERCOCET/ROXICET Take 1 tablet by mouth every 8 (eight) hours as needed for severe pain.   PROBIOTIC DAILY PO Take 1 capsule by mouth at bedtime.   venlafaxine XR 75 MG 24 hr capsule Commonly known as:  EFFEXOR-XR Take 1 capsule (75 mg total) by mouth daily with breakfast.      Follow-up Information    Tisovec, Fransico Him, MD. Schedule an appointment as soon as possible for a visit in 1 week(s).   Specialty:  Internal Medicine Contact information: [place] [address] [phone]          Allergies  Allergen Reactions  . Dilaudid [Hydromorphone Hcl] Itching and Other (See Comments)    UNCONTROLLABLE CRYING    Consultations: Oncology  Procedures/Studies: Dg Chest 1 View  Result Date: 01/13/2018 CLINICAL DATA:  Breast cancer.  Followup right thoracentesis. EXAM: CHEST  1 VIEW COMPARISON:  Chest CT 01/12/2018. FINDINGS: No visible pleural fluid on either side on this AP  projection. No pneumothorax. Bilateral airspace filling worse on the right than the left consistent with edema or pneumonia. Power port unchanged with its tip in the SVC. IMPRESSION: No visible pleural effusions. No pneumothorax. Persistent bilateral alveolar filling shadows right more than left. Electronically Signed   By: Nelson Chimes M.D.   On: 01/13/2018 16:54   Dg Chest 2 View  Result Date: 01/14/2018 CLINICAL DATA:  Shortness of breath EXAM: CHEST - 2 VIEW COMPARISON:  01/13/2018, 10/21/2016, CT chest 01/12/2018 FINDINGS: Left-sided central venous port tip over the SVC. Small bilateral pleural effusions. Stable cardiomediastinal silhouette. Similar appearance of right greater than left consolidations and ground-glass opacity. Clips in the right axilla. IMPRESSION: 1. Trace bilateral pleural effusions. 2. No significant interval change than right greater than left consolidations and ground-glass densities. Electronically Signed   By: Donavan Foil M.D.   On: 01/14/2018 15:51   Ct Angio Chest Pe W Or Wo Contrast  Result Date: 01/12/2018 CLINICAL DATA:  Shortness of breath, heavy chest pain. Recent diagnosis of pneumonia, on antibiotics. History of RIGHT breast cancer in 2019 status post bilateral mastectomies. Current radiation therapy. EXAM: CT ANGIOGRAPHY CHEST WITH CONTRAST TECHNIQUE: Multidetector CT imaging of the chest was  performed using the standard protocol during bolus administration of intravenous contrast. Multiplanar CT image reconstructions and MIPs were obtained to evaluate the vascular anatomy. CONTRAST:  63mL ISOVUE-370 IOPAMIDOL (ISOVUE-370) INJECTION 76% COMPARISON:  Chest CT dated 05/21/2017. FINDINGS: Cardiovascular: There is no pulmonary embolism identified within the main, lobar or segmental pulmonary arteries bilaterally. No thoracic aortic aneurysm or evidence of aortic dissection. Heart size is normal. No pericardial effusion. Mediastinum/Nodes: No mass or enlarged lymph nodes  seen within the mediastinum or perihilar regions. Esophagus is unremarkable. Trachea and central bronchi are unremarkable. Lungs/Pleura: Patchy consolidations within both lungs, involving all lobes, RIGHT greater than LEFT, some components with ground-glass opacities. RIGHT pleural effusion, moderate to large in size. LEFT pleural effusion, moderate in size. Upper Abdomen: Limited images of the upper abdomen are unremarkable. Musculoskeletal: No acute or suspicious osseous finding. Review of the MIP images confirms the above findings. IMPRESSION: 1. No pulmonary embolism. 2. Patchy consolidations throughout both lungs, involving all lobes, RIGHT greater than LEFT, some components with ground glass opacities. Differential includes atypical pneumonias such as viral or fungal, interstitial pneumonias, edema related to volume overload/CHF, chronic interstitial diseases, hypersensitivity pneumonitis, and respiratory bronchiolitis. Given the history of current radiation, findings could also represent radiation induced pneumonitis or a combination of pneumonia and radiation pneumonitis. 3. RIGHT pleural effusion, moderate to large in size. 4. LEFT pleural effusion, moderate in size. Electronically Signed   By: Franki Cabot M.D.   On: 01/12/2018 14:13   US Thoracentesis Asp Pleural Space W/img Guide  Result Date: 01/13/2018 INDICATION: Patient with history of breast cancer, dyspnea, bilateral pleural effusions right greater than left. Request received for diagnostic and therapeutic right thoracentesis EXAM: ULTRASOUND GUIDED DIAGNOSTIC AND THERAPEUTIC RIGHT THORACENTESIS MEDICATIONS: None COMPLICATIONS: None immediate. PROCEDURE: An ultrasound guided thoracentesis was thoroughly discussed with the patient and questions answered. The benefits, risks, alternatives and complications were also discussed. The patient understands and wishes to proceed with the procedure. Written consent was obtained. Ultrasound was  performed to localize and mark an adequate pocket of fluid in the right chest. The area was then prepped and draped in the normal sterile fashion. 1% Lidocaine was used for local anesthesia. Under ultrasound guidance a 6 Fr Safe-T-Centesis catheter was introduced. Thoracentesis was performed. The catheter was removed and a dressing applied. FINDINGS: A total of approximately 930 cc of slightly hazy, yellow fluid was removed. Samples were sent to the laboratory as requested by the clinical team. IMPRESSION: Successful ultrasound guided diagnostic and therapeutic right thoracentesis yielding 930 cc of pleural fluid. Read by: Rowe Robert, PA-C Electronically Signed   By: Jerilynn Mages.  Shick M.D.   On: 01/13/2018 16:29       Subjective: Patient seen and examined at the bed side this morning.  Hemodynamically she is stable.  She has mild subjective feeling of shortness of breath.  Her saturations are good.  She aambulated on the hallway and did well.  Discharge Exam: Vitals:   01/15/18 2100 01/16/18 0452  BP: 135/87 (!) 127/94  Pulse: (!) 115 (!) 105  Resp: 17 18  Temp: 98.9 F (37.2 C) 98.6 F (37 C)  SpO2: 96% 94%   Vitals:   01/15/18 0500 01/15/18 1300 01/15/18 2100 01/16/18 0452  BP: 121/86 115/80 135/87 (!) 127/94  Pulse: (!) 101 100 (!) 115 (!) 105  Resp: 17 18 17 18   Temp: 98.4 F (36.9 C) (!) 97.3 F (36.3 C) 98.9 F (37.2 C) 98.6 F (37 C)  TempSrc: Oral Oral Oral Oral  SpO2: 98% 99% 96% 94%    General: Pt is alert, awake, not in acute distress Cardiovascular: RRR, S1/S2 +, no rubs, no gallops Respiratory: CTA bilaterally, no wheezing, no rhonchi Abdominal: Soft, NT, ND, bowel sounds + Extremities: no edema, no cyanosis    The results of significant diagnostics from this hospitalization (including imaging, microbiology, ancillary and laboratory) are listed below for reference.     Microbiology: Recent Results (from the past 240 hour(s))  Culture, sputum-assessment      Status: None   Collection Time: 01/13/18  2:12 PM  Result Value Ref Range Status   Specimen Description SPUTUM  Final   Special Requests NONE  Final   Sputum evaluation   Final    THIS SPECIMEN IS ACCEPTABLE FOR SPUTUM CULTURE Performed at [facility], [place] [address] [place], [place] [address]    Report Status 01/13/2018 FINAL  Final  Culture, respiratory     Status: None   Collection Time: 01/13/18  2:12 PM  Result Value Ref Range Status   Specimen Description   Final    SPUTUM Performed at [place] [address], [place], [place] [address]    Special Requests   Final    NONE Reflexed from [id number] Performed at [facility], [place] [address], [place], [place] [address]    Gram Stain   Final    MODERATE WBC PRESENT,BOTH PMN AND MONONUCLEAR FEW GRAM POSITIVE COCCI FEW GRAM VARIABLE ROD    Culture   Final    Consistent with normal respiratory flora. Performed at [facility], [place] [address], [place], [place] [address]    Report Status 01/16/2018 FINAL  Final  Culture, blood (routine x 2) Call MD if unable to obtain prior to antibiotics being given     Status: None (Preliminary result)   Collection Time: 01/13/18  2:24 PM  Result Value Ref Range Status   Specimen Description   Final    BLOOD RIGHT ANTECUBITAL Performed at [place] [address], [place], [place] [address]    Special Requests   Final    BOTTLES DRAWN AEROBIC AND ANAEROBIC Blood Culture adequate volume Performed at [place] [address] [place], [place] [address]    Culture   Final    NO GROWTH 3 DAYS Performed at [facility], [place] [address] [place], [place] [address]    Report Status PENDING  Incomplete  Culture, blood (routine x 2) Call MD if unable to obtain prior to antibiotics being given     Status: None (Preliminary result)   Collection Time: 01/13/18  2:28 PM  Result Value  Ref Range Status   Specimen Description   Final    BLOOD RIGHT HAND Performed at [place] [address], [place], [place] [address]    Special Requests   Final    BOTTLES DRAWN AEROBIC ONLY Blood Culture adequate volume Performed at [place] [address] [place], [place] [address]    Culture   Final    NO GROWTH 3 DAYS Performed at [facility], [place] [address] [place], [place] [address]    Report Status PENDING  Incomplete  Gram stain     Status: None   Collection Time: 01/13/18  4:16 PM  Result Value Ref Range Status   Specimen Description PLEURAL RIGHT  Final   Special Requests NONE  Final   Gram Stain  Final    RARE WBC PRESENT, PREDOMINANTLY MONONUCLEAR NO ORGANISMS SEEN Performed at [facility], [place] [address], [place], [place] [address]    Report Status 01/13/2018 FINAL  Final  Culture, body fluid-bottle     Status: None (Preliminary result)   Collection Time: 01/13/18  4:16 PM  Result Value Ref Range Status   Specimen Description PLEURAL RIGHT  Final   Special Requests NONE  Final   Culture   Final    NO GROWTH 3 DAYS Performed at [place] [address] [place], [place] [address]    Report Status PENDING  Incomplete     Labs: BNP (last 3 results) No results for input(s): BNP in the last 8760 hours. Basic Metabolic Panel: Recent Labs  Lab 01/13/18 1424 01/15/18 0648 01/16/18 0514  NA 140  --  140  K 4.2  --  4.1  CL 107  --  109  CO2 27  --  22  GLUCOSE 154*  --  211*  BUN 16  --  10  CREATININE 0.90 0.83 0.70  CALCIUM 8.8*  --  8.5*   Liver Function Tests: Recent Labs  Lab 01/13/18 1424  AST 21  ALT 16  ALKPHOS 61  BILITOT 0.7  PROT 6.5  ALBUMIN 3.2*   No results for input(s): LIPASE, AMYLASE in the last 168 hours. No results for input(s): AMMONIA in the last 168 hours. CBC: Recent Labs  Lab 01/13/18 1424 01/16/18 0514  WBC 7.5 7.5  NEUTROABS 5.7 5.7  HGB  10.7* 11.5*  HCT 35.6* 37.7  MCV 90.8 89.3  PLT 428* 369   Cardiac Enzymes: No results for input(s): CKTOTAL, CKMB, CKMBINDEX, TROPONINI in the last 168 hours. BNP: Invalid input(s): POCBNP CBG: Recent Labs  Lab 01/15/18 1804 01/15/18 2058 01/16/18 0314 01/16/18 0745 01/16/18 1138  GLUCAP 166* 200* 150* 168* 190*   D-Dimer No results for input(s): DDIMER in the last 72 hours. Hgb A1c No results for input(s): HGBA1C in the last 72 hours. Lipid Profile No results for input(s): CHOL, HDL, LDLCALC, TRIG, CHOLHDL, LDLDIRECT in the last 72 hours. Thyroid function studies No results for input(s): TSH, T4TOTAL, T3FREE, THYROIDAB in the last 72 hours.  Invalid input(s): FREET3 Anemia work up No results for input(s): VITAMINB12, FOLATE, FERRITIN, TIBC, IRON, RETICCTPCT in the last 72 hours. Urinalysis    Component Value Date/Time   COLORURINE YELLOW 12/02/2017 1030   APPEARANCEUR HAZY (A) 12/02/2017 1030   LABSPEC 1.023 12/02/2017 1030   PHURINE 5.0 12/02/2017 1030   GLUCOSEU >=500 (A) 12/02/2017 1030   HGBUR MODERATE (A) 12/02/2017 1030   BILIRUBINUR NEGATIVE 12/02/2017 1030   BILIRUBINUR n 09/06/2014 0845   KETONESUR NEGATIVE 12/02/2017 1030   PROTEINUR NEGATIVE 12/02/2017 1030   UROBILINOGEN negative 09/06/2014 0845   NITRITE NEGATIVE 12/02/2017 1030   LEUKOCYTESUR MODERATE (A) 12/02/2017 1030   Sepsis Labs Invalid input(s): PROCALCITONIN,  WBC,  LACTICIDVEN Microbiology Recent Results (from the past 240 hour(s))  Culture, sputum-assessment     Status: None   Collection Time: 01/13/18  2:12 PM  Result Value Ref Range Status   Specimen Description SPUTUM  Final   Special Requests NONE  Final   Sputum evaluation   Final    THIS SPECIMEN IS ACCEPTABLE FOR SPUTUM CULTURE Performed at [facility], [place] [address], [place], [place] [address]    Report Status 01/13/2018 FINAL  Final  Culture, respiratory     Status: None   Collection  Time: 01/13/18   2:12 PM  Result Value Ref Range Status   Specimen Description   Final    SPUTUM Performed at [place] [address] [place], [place] [address]    Special Requests   Final    NONE Reflexed from [id number] Performed at [facility], [place] [address], [place], [place] [address]    Gram Stain   Final    MODERATE WBC PRESENT,BOTH PMN AND MONONUCLEAR FEW GRAM POSITIVE COCCI FEW GRAM VARIABLE ROD    Culture   Final    Consistent with normal respiratory flora. Performed at [facility], [place] [address] [place], [place] [address]    Report Status 01/16/2018 FINAL  Final  Culture, blood (routine x 2) Call MD if unable to obtain prior to antibiotics being given     Status: None (Preliminary result)   Collection Time: 01/13/18  2:24 PM  Result Value Ref Range Status   Specimen Description   Final    BLOOD RIGHT ANTECUBITAL Performed at [place] [address], [place], [place] [address]    Special Requests   Final    BOTTLES DRAWN AEROBIC AND ANAEROBIC Blood Culture adequate volume Performed at [facility] [address], [place], [place] [address]    Culture   Final    NO GROWTH 3 DAYS Performed at [facility], [place] [address], [place], [place] [address]    Report Status PENDING  Incomplete  Culture, blood (routine x 2) Call MD if unable to obtain prior to antibiotics being given     Status: None (Preliminary result)   Collection Time: 01/13/18  2:28 PM  Result Value Ref Range Status   Specimen Description   Final    BLOOD RIGHT HAND Performed at [facility] [address], [place], [place] [address]    Special Requests   Final    BOTTLES DRAWN AEROBIC ONLY Blood Culture adequate volume Performed at [place] [address], [place], [place] [address]    Culture   Final    NO GROWTH 3 DAYS Performed at [facility], [place] [address], [place], [place] [address]    Report Status PENDING  Incomplete  Gram stain     Status: None   Collection Time: 01/13/18  4:16 PM  Result Value Ref Range Status   Specimen Description PLEURAL RIGHT  Final   Special Requests NONE  Final   Gram Stain   Final    RARE WBC PRESENT, PREDOMINANTLY MONONUCLEAR NO ORGANISMS SEEN Performed at [facility], [address], [place], [place] [address]    Report Status 01/13/2018 FINAL  Final  Culture, body fluid-bottle     Status: None (Preliminary result)   Collection Time: 01/13/18  4:16 PM  Result Value Ref Range Status   Specimen Description PLEURAL RIGHT  Final   Special Requests NONE  Final   Culture   Final    NO GROWTH 3 DAYS Performed at [place] [address] [place],  [address]    Report Status PENDING  Incomplete    Please note: You were cared for by a hospitalist during your hospital stay. Once you are discharged, your primary care physician will handle any further medical issues. Please note that NO REFILLS for any discharge medications will be authorized once you are discharged, as it is imperative that you return to your  primary care physician (or establish a relationship with a primary care physician if you do not have one) for your post hospital discharge needs so that they can reassess your need for medications and monitor your lab values.    Time coordinating discharge: 40 minutes  SIGNED:   Shelly Coss, MD  [facility] 01/16/2018, 1:04 PM Pager [phone]  If 7PM-7AM, please contact night-coverage [email or web address] Password [email or web address]

## 2018-01-18 LAB — CULTURE, BLOOD (ROUTINE X 2)
CULTURE: NO GROWTH
Culture: NO GROWTH
SPECIAL REQUESTS: ADEQUATE
Special Requests: ADEQUATE

## 2018-01-18 LAB — CULTURE, BODY FLUID-BOTTLE

## 2018-01-18 LAB — CULTURE, BODY FLUID W GRAM STAIN -BOTTLE: Culture: NO GROWTH

## 2018-01-19 ENCOUNTER — Ambulatory Visit
Admission: RE | Admit: 2018-01-19 | Discharge: 2018-01-19 | Disposition: A | Discharge status: Home / Self Care | Payer: BLUE CROSS/BLUE SHIELD | Source: Ambulatory Visit | Attending: Radiation Oncology | Admitting: Radiation Oncology

## 2018-01-19 DIAGNOSIS — Z170 Estrogen receptor positive status [ER+]: Secondary | ICD-10-CM | POA: Diagnosis not present

## 2018-01-19 DIAGNOSIS — C50611 Malignant neoplasm of axillary tail of right female breast: Secondary | ICD-10-CM | POA: Diagnosis not present

## 2018-01-19 DIAGNOSIS — Z510 Encounter for antineoplastic radiation therapy: Secondary | ICD-10-CM | POA: Diagnosis not present

## 2018-01-20 ENCOUNTER — Ambulatory Visit: Payer: BLUE CROSS/BLUE SHIELD

## 2018-01-20 ENCOUNTER — Ambulatory Visit
Admission: RE | Admit: 2018-01-20 | Discharge: 2018-01-20 | Disposition: A | Discharge status: Home / Self Care | Payer: BLUE CROSS/BLUE SHIELD | Source: Ambulatory Visit | Attending: Radiation Oncology | Admitting: Radiation Oncology

## 2018-01-20 DIAGNOSIS — C50611 Malignant neoplasm of axillary tail of right female breast: Secondary | ICD-10-CM | POA: Diagnosis not present

## 2018-01-20 DIAGNOSIS — M25612 Stiffness of left shoulder, not elsewhere classified: Secondary | ICD-10-CM | POA: Diagnosis not present

## 2018-01-20 DIAGNOSIS — I972 Postmastectomy lymphedema syndrome: Secondary | ICD-10-CM | POA: Diagnosis not present

## 2018-01-20 DIAGNOSIS — Z510 Encounter for antineoplastic radiation therapy: Secondary | ICD-10-CM | POA: Diagnosis not present

## 2018-01-20 DIAGNOSIS — M79622 Pain in left upper arm: Secondary | ICD-10-CM | POA: Diagnosis not present

## 2018-01-20 DIAGNOSIS — Z170 Estrogen receptor positive status [ER+]: Secondary | ICD-10-CM | POA: Diagnosis not present

## 2018-01-20 DIAGNOSIS — M79621 Pain in right upper arm: Secondary | ICD-10-CM | POA: Diagnosis not present

## 2018-01-20 DIAGNOSIS — M25611 Stiffness of right shoulder, not elsewhere classified: Secondary | ICD-10-CM | POA: Diagnosis not present

## 2018-01-20 DIAGNOSIS — R293 Abnormal posture: Secondary | ICD-10-CM

## 2018-01-20 DIAGNOSIS — Z483 Aftercare following surgery for neoplasm: Secondary | ICD-10-CM

## 2018-01-20 DIAGNOSIS — R600 Localized edema: Secondary | ICD-10-CM | POA: Diagnosis not present

## 2018-01-20 NOTE — Therapy (Signed)
[place], [place], [address] Phone: [phone]   Fax:  [phone]  Physical Therapy Treatment  Patient Details  Name: Stephanie Holden MRN: [id number] Date of Birth: December 10, 1975 Referring Provider (PT): Dr. Eppie Gibson   Encounter Date: 01/20/2018  PT End of Session - 01/20/18 1149    Visit Number  2    Number of Visits  12    Date for PT Re-Evaluation  02/12/18    PT Start Time  1021    PT Stop Time  1107    PT Time Calculation (min)  46 min    Activity Tolerance  Patient tolerated treatment well    Behavior During Therapy  [facility] for tasks assessed/performed       Past Medical History:  Diagnosis Date  . Cancer [facility])    Breast cancer - right  . Constipation   . Depression   . Genetic testing 05/27/2017   Breast/GYN panel (23 genes) @ [facility] - No pathogenic mutations detected  . GERD (gastroesophageal reflux disease)   . Insulin dependent type 1 diabetes mellitus ([place])   . Insulin pump in place   . PONV (postoperative nausea and vomiting)   . Stenosing tenosynovitis of thumb 06/2014   right  . Thyroid goiter     Past Surgical History:  Procedure Laterality Date  . AXILLARY LYMPH NODE DISSECTION Right 06/26/2017   Procedure: AXILLARY LYMPH NODE DISSECTION;  Surgeon: [name], MD;  Location: [place];  Service: General;  Laterality: Right;  . CARPAL TUNNEL RELEASE Bilateral 10/28/2013   Procedure: BILATERAL CARPAL TUNNEL RELEASE;  Surgeon: Daryll Brod, MD;  Location: [place];  Service: Orthopedics;  Laterality: Bilateral;  . CESAREAN SECTION  2000; 08/20/2001  . ESSURE TUBAL LIGATION  2005   failed, tubal puncture  . LAPAROSCOPIC TUBAL LIGATION  04/02/2007   removal Essure  . LAPAROSCOPIC UNILATERAL SALPINGECTOMY  2005  . MASTECTOMY W/ SENTINEL NODE BIOPSY Bilateral 06/05/2017   Procedure: BILATERAL MASTECTOMIES  WITH RIGHT SENTINEL LYMPH NODE BIOPSY;  Surgeon: [name],  MD;  Location: [place];  Service: General;  Laterality: Bilateral;  . PORTACATH PLACEMENT Left 06/26/2017   Procedure: INSERTION PORT-A-CATH;  Surgeon: [name], MD;  Location: [place];  Service: General;  Laterality: Left;  . TRIGGER FINGER RELEASE Left 04/12/2014   Procedure: RELEASE A-1 PULLEY LEFT THUMB;  Surgeon: Daryll Brod, MD;  Location: [place];  Service: Orthopedics;  Laterality: Left;  . TRIGGER FINGER RELEASE Right 07/19/2014   Procedure: RELEASE TRIGGER FINGER/A-1 PULLEY RIGHT THUMB;  Surgeon: Daryll Brod, MD;  Location: [place];  Service: Orthopedics;  Laterality: Right;    There were no vitals filed for this visit.  Subjective Assessment - 01/20/18 1026    Subjective  I ended up in the hospital last week due to pneumonia and was in from Tuesday until Friday. My arm swelling is of course a little worse since having fluids and just laying alot last week not feeling well. But it is starting to calm down a little. I can see the veins in my hand again.     Pertinent History  Patient was diagnosed on 05/08/17 with right invasive ductal carcinoma breast cancer. She was seen in 5/18 and found to have a right axillary mass but did not f/u for further testing. She then had a mammogram in 2/19 and was diagnosed with right breast cancer which had metastasized to the axillary lymph nodes.  She had multiple small masses in the lower outer quadrant of her right breast with the largest mass measuring 1.3 cm. It is ER/PR positive and HER2 negative with a Ki67 of 2%. Patient underwent a bilateral mastectomy with right targeted node dissection with 4/7 positive on 06/05/17 for right invasive ductal carcinoma. She then underwent a right axillary node dissection on 06/26/17 with 2/20 positive nodes. Chemo completed 12/09/17 and began radiation 12/30/17.    Patient Stated Goals  Reduce swelling    Currently in Pain?  Yes    Pain Score  8     Pain Location  Knee    Pain Orientation   Right;Left    Pain Descriptors / Indicators  Aching    Pain Type  Acute pain    Pain Onset  More than a month ago    Pain Frequency  Intermittent    Aggravating Factors   I'm told it's from my body recovering from the chemo    Pain Relieving Factors  pain meds            LYMPHEDEMA/ONCOLOGY QUESTIONNAIRE - 01/20/18 1108      Right Upper Extremity Lymphedema   15 cm Proximal to Olecranon Process  34.2 cm    10 cm Proximal to Olecranon Process  33.3 cm    Olecranon Process  26.1 cm    10 cm Proximal to Ulnar Styloid Process  22.7 cm    Just Proximal to Ulnar Styloid Process  14.7 cm    Across Hand at [employer]  17.1 cm    At [place] of 2nd Digit  5.7 cm           Outpatient [facility]  Lymphedema Life Impact Scale Total Score  23.53 %           [facility] Adult PT Treatment/Exercise - 01/20/18 0001      Manual Therapy   Manual Therapy  Manual Lymphatic Drainage (MLD)    Manual Lymphatic Drainage (MLD)  In Supine: Short neck, 5 diaphragmatic breaths, Rt inguinal nodes and Rt axillo-inguinal anastomosis, left axillary and anterior inter-axillary pathways; then Rt UE from lateral upper arm to dorsal hand working proximal to distal then retracing all steps               PT Short Term Goals - 01/01/18 1100      PT SHORT TERM GOAL #1   Title  Patient will verbalize where and how to be fitted for a night time compression garment.    Time  3    Period  Weeks    Status  New      PT SHORT TERM GOAL #2   Title  Patient will decease right arm swelling at 10 cm proximal to her ulnar styloid process to be </= 24 cm.    Baseline  25 cm    Time  3    Period  Weeks    Status  New      PT SHORT TERM GOAL #3   Title  Patient will decease right arm swelling at her olecranon to be </=26 cm.    Baseline  26.9    Time  3    Period  Weeks    Status  New        PT Long Term Goals - 01/01/18 1102      PT  LONG TERM GOAL #2   Title  Patient will decease right arm swelling  at her olecranon to be </=25 cm.    Baseline  26.9 cm    Time  6    Period  Weeks    Status  New      PT LONG TERM GOAL #3   Title  Patient will verbalize good understanding of the Maintenance Phase of treatment for lymphedema.    Baseline  .    Time  6    Status  New      PT LONG TERM GOAL #4   Title  Patient will demonstrate proper technique for performing self manual lymph drainage.    Time  6    Period  Weeks    Status  New            Plan - 01/20/18 1150    Clinical Impression Statement  Today was Ashleys first visit back since evaluation more than 2 weeks ago. She was hospitalized last week due to pneumonia and was admitted from Tues-Friday. Today she reports feeling much better but still just very fatigued. Began manual lymph drainage of her Rt UE and remeasured her Rt UE circumference. Her measurements have greatly reduced from 2 weeks ago and she reports wearing compression sleeve and gauntlet daily. Also discussed nighttime garment with pt and she is still interested in being measured for this. Called and spoke with fitter Jerrol Banana (she did try to cal her but could not reach her when pt was in hospital last week) and she will be seen 01/22/18 to be fitted for garment.     Rehab Potential  Good    Clinical Impairments Affecting Rehab Potential  Radiation to right axilla may worsen lymphedema    PT Frequency  2x / week    PT Duration  6 weeks    PT Treatment/Interventions  ADLs/Self Care Home Management;Therapeutic activities;Therapeutic exercise;Patient/family education;Manual techniques;Manual lymph drainage;Scar mobilization;Passive range of motion;DME Instruction    PT Next Visit Plan  Continue manual lymph drainage; can we add general strength as pt reports feeling very weak since ending chemo and then pneumonia last week??    Consulted and Agree with Plan of Care  Patient       Patient will  benefit from skilled therapeutic intervention in order to improve the following deficits and impairments:  Postural dysfunction, Decreased knowledge of precautions, Impaired UE functional use, Pain, Increased edema  Visit Diagnosis: Abnormal posture  Aftercare following surgery for neoplasm  Postmastectomy lymphedema     Problem List Patient Active Problem List   Diagnosis Date Noted  . HCAP (healthcare-associated pneumonia) 01/13/2018  . Pleural effusion 01/13/2018  . Breast cancer metastasized to axillary lymph node, right ([place]) 06/05/2017  . Genetic testing 05/27/2017  . Malignant neoplasm of axillary tail of right breast in female, estrogen receptor positive ([place]) 05/09/2017  . Cubital tunnel syndrome on left 10/02/2015  . Trigger finger, left index finger 04/12/2015  . Erroneous encounter - disregard 09/22/2014  . Diabetes type 1, controlled ([place]) 01/12/2013    Otelia Limes, PTA 01/20/2018, 11:58 AM  [place], [place], [address] Phone: [phone]   Fax:  [phone]  Name: Stephanie Holden MRN: [id number] Date of Birth: July 22, 1975

## 2018-01-21 ENCOUNTER — Ambulatory Visit
Admission: RE | Admit: 2018-01-21 | Discharge: 2018-01-21 | Disposition: A | Discharge status: Home / Self Care | Payer: BLUE CROSS/BLUE SHIELD | Source: Ambulatory Visit | Attending: Radiation Oncology | Admitting: Radiation Oncology

## 2018-01-21 DIAGNOSIS — C50611 Malignant neoplasm of axillary tail of right female breast: Secondary | ICD-10-CM | POA: Diagnosis not present

## 2018-01-21 DIAGNOSIS — Z510 Encounter for antineoplastic radiation therapy: Secondary | ICD-10-CM | POA: Diagnosis not present

## 2018-01-21 DIAGNOSIS — Z170 Estrogen receptor positive status [ER+]: Secondary | ICD-10-CM | POA: Diagnosis not present

## 2018-01-21 NOTE — Telephone Encounter (Signed)
Routing to provider for review. Encounter is still open. Okay to close encounter or is further follow up needed?

## 2018-01-21 NOTE — Telephone Encounter (Signed)
Ok to close encounter. 

## 2018-01-22 ENCOUNTER — Ambulatory Visit: Payer: BLUE CROSS/BLUE SHIELD | Admitting: Physical Therapy

## 2018-01-22 ENCOUNTER — Ambulatory Visit
Admission: RE | Admit: 2018-01-22 | Discharge: 2018-01-22 | Disposition: A | Discharge status: Home / Self Care | Payer: BLUE CROSS/BLUE SHIELD | Source: Ambulatory Visit | Attending: Radiation Oncology | Admitting: Radiation Oncology

## 2018-01-22 ENCOUNTER — Other Ambulatory Visit: Payer: Self-pay

## 2018-01-22 ENCOUNTER — Encounter: Payer: Self-pay | Admitting: Physical Therapy

## 2018-01-22 DIAGNOSIS — Z483 Aftercare following surgery for neoplasm: Secondary | ICD-10-CM

## 2018-01-22 DIAGNOSIS — M79621 Pain in right upper arm: Secondary | ICD-10-CM | POA: Diagnosis not present

## 2018-01-22 DIAGNOSIS — R600 Localized edema: Secondary | ICD-10-CM

## 2018-01-22 DIAGNOSIS — C50611 Malignant neoplasm of axillary tail of right female breast: Secondary | ICD-10-CM

## 2018-01-22 DIAGNOSIS — I972 Postmastectomy lymphedema syndrome: Secondary | ICD-10-CM

## 2018-01-22 DIAGNOSIS — R293 Abnormal posture: Secondary | ICD-10-CM

## 2018-01-22 DIAGNOSIS — Z170 Estrogen receptor positive status [ER+]: Secondary | ICD-10-CM

## 2018-01-22 DIAGNOSIS — M25611 Stiffness of right shoulder, not elsewhere classified: Secondary | ICD-10-CM | POA: Diagnosis not present

## 2018-01-22 DIAGNOSIS — M79622 Pain in left upper arm: Secondary | ICD-10-CM

## 2018-01-22 DIAGNOSIS — M25612 Stiffness of left shoulder, not elsewhere classified: Secondary | ICD-10-CM | POA: Diagnosis not present

## 2018-01-22 DIAGNOSIS — Z510 Encounter for antineoplastic radiation therapy: Secondary | ICD-10-CM | POA: Diagnosis not present

## 2018-01-22 NOTE — Therapy (Signed)
[place], [place], [address] Phone: [phone]   Fax:  [phone]  Physical Therapy Treatment  Patient Details  Name: Stephanie Holden MRN: [id number] Date of Birth: 02-27-1976 Referring Provider (PT): Dr. Eppie Gibson   Encounter Date: 01/22/2018  PT End of Session - 01/22/18 1014    Visit Number  3    Number of Visits  12    Date for PT Re-Evaluation  02/12/18    Authorization Type  Patient only has 30 PT visits per year and as of 01/01/18, she had already used 18.    PT Start Time  1010    PT Stop Time  1054    PT Time Calculation (min)  44 min    Activity Tolerance  Patient tolerated treatment well    Behavior During Therapy  WFL for tasks assessed/performed       Past Medical History:  Diagnosis Date  . Cancer [facility])    Breast cancer - right  . Constipation   . Depression   . Genetic testing 05/27/2017   Breast/GYN panel (23 genes) @ [facility] - No pathogenic mutations detected  . GERD (gastroesophageal reflux disease)   . Insulin dependent type 1 diabetes mellitus ([place])   . Insulin pump in place   . PONV (postoperative nausea and vomiting)   . Stenosing tenosynovitis of thumb 06/2014   right  . Thyroid goiter     Past Surgical History:  Procedure Laterality Date  . AXILLARY LYMPH NODE DISSECTION Right 06/26/2017   Procedure: AXILLARY LYMPH NODE DISSECTION;  Surgeon: [name], MD;  Location: [place];  Service: General;  Laterality: Right;  . CARPAL TUNNEL RELEASE Bilateral 10/28/2013   Procedure: BILATERAL CARPAL TUNNEL RELEASE;  Surgeon: Daryll Brod, MD;  Location: [place];  Service: Orthopedics;  Laterality: Bilateral;  . CESAREAN SECTION  2000; 08/20/2001  . ESSURE TUBAL LIGATION  2005   failed, tubal puncture  . LAPAROSCOPIC TUBAL LIGATION  04/02/2007   removal Essure  . LAPAROSCOPIC UNILATERAL SALPINGECTOMY  2005  . MASTECTOMY W/ SENTINEL NODE BIOPSY Bilateral  06/05/2017   Procedure: BILATERAL MASTECTOMIES  WITH RIGHT SENTINEL LYMPH NODE BIOPSY;  Surgeon: [name], MD;  Location: [place];  Service: General;  Laterality: Bilateral;  . PORTACATH PLACEMENT Left 06/26/2017   Procedure: INSERTION PORT-A-CATH;  Surgeon: [name], MD;  Location: [place];  Service: General;  Laterality: Left;  . TRIGGER FINGER RELEASE Left 04/12/2014   Procedure: RELEASE A-1 PULLEY LEFT THUMB;  Surgeon: Daryll Brod, MD;  Location: [place];  Service: Orthopedics;  Laterality: Left;  . TRIGGER FINGER RELEASE Right 07/19/2014   Procedure: RELEASE TRIGGER FINGER/A-1 PULLEY RIGHT THUMB;  Surgeon: Daryll Brod, MD;  Location: [place];  Service: Orthopedics;  Laterality: Right;    There were no vitals filed for this visit.  Subjective Assessment - 01/22/18 1005    Subjective  Patient arrived early today to be seen by the [facility] fitter for compression garments.    Patient Stated Goals  Reduce swelling    Currently in Pain?  Yes    Pain Score  6     Pain Location  Knee    Pain Orientation  Right;Left    Pain Descriptors / Indicators  Aching    Pain Type  Neuropathic pain    Pain Radiating Towards  feet    Pain Onset  More than a month ago    Pain Frequency  Intermittent               Katina Dung - 01/22/18 0001    Open a tight or new jar  Moderate difficulty    Do heavy household chores (wash walls, wash floors)  Mild difficulty    Carry a shopping bag or briefcase  No difficulty    Wash your back  No difficulty    Use a knife to cut food  No difficulty    Recreational activities in which you take some force or impact through your arm, shoulder, or hand (golf, hammering, tennis)  Moderate difficulty    During the past week, to what extent has your arm, shoulder or hand problem interfered with your normal social activities with family, friends, neighbors, or groups?  Not at all    During the past week, to what extent has your arm,  shoulder or hand problem limited your work or other regular daily activities  Slightly    Arm, shoulder, or hand pain.  Mild    Tingling (pins and needles) in your arm, shoulder, or hand  Mild    Difficulty Sleeping  Mild difficulty    DASH Score  20.45 %        Outpatient Rehab from 01/01/2018 in Home  Lymphedema Life Impact Scale Total Score  23.53 %           [facility] Adult PT Treatment/Exercise - 01/22/18 0001      Manual Therapy   Manual Therapy  Manual Lymphatic Drainage (MLD);Passive ROM    Manual Lymphatic Drainage (MLD)  In Supine: Short neck, 5 diaphragmatic breaths, Rt inguinal nodes and Rt axillo-inguinal anastomosis, left axillary and anterior inter-axillary pathways; then Rt UE from lateral upper arm to dorsal hand working proximal to distal then retracing all steps    Passive ROM  PROM right arm with neural stretching in supine               PT Short Term Goals - 01/01/18 1100      PT SHORT TERM GOAL #1   Title  Patient will verbalize where and how to be fitted for a night time compression garment.    Time  3    Period  Weeks    Status  New      PT SHORT TERM GOAL #2   Title  Patient will decease right arm swelling at 10 cm proximal to her ulnar styloid process to be </= 24 cm.    Baseline  25 cm    Time  3    Period  Weeks    Status  New      PT SHORT TERM GOAL #3   Title  Patient will decease right arm swelling at her olecranon to be </=26 cm.    Baseline  26.9    Time  3    Period  Weeks    Status  New        PT Long Term Goals - 01/01/18 1102      PT LONG TERM GOAL #2   Title  Patient will decease right arm swelling at her olecranon to be </=25 cm.    Baseline  26.9 cm    Time  6    Period  Weeks    Status  New      PT LONG TERM GOAL #3   Title  Patient will verbalize good understanding of the Maintenance Phase of treatment for lymphedema.  Baseline  .    Time  6    Status  New      PT  LONG TERM GOAL #4   Title  Patient will demonstrate proper technique for performing self manual lymph drainage.    Time  6    Period  Weeks    Status  New            Plan - 01/22/18 1039    Clinical Impression Statement  Patient was fitted today for her nighttime compression garment by the fitter prior to treatment. She tolerated manual lymph drainage well. She has neural tension in her right arm. Mild bilateral feet swelling which may be a delayed chemo effect but she was encouraged to talk with her MD about it. She will benefit from continued PT to further reduce edema and to focus on overall strengthening as she has become weak with chemo.    Rehab Potential  Excellent    PT Treatment/Interventions  ADLs/Self Care Home Management;Therapeutic activities;Therapeutic exercise;Patient/family education;Manual techniques;Manual lymph drainage;Scar mobilization;Passive range of motion;DME Instruction    PT Next Visit Plan  Check skin as we may need to hold off on manual lymph drainage as her skin is becoming red from radiation. Begin BLE strengthening exercises - bike? for overall weakness    PT Home Exercise Plan  Continue with previously given HEP    Consulted and Agree with Plan of Care  Patient       Patient will benefit from skilled therapeutic intervention in order to improve the following deficits and impairments:  Postural dysfunction, Decreased knowledge of precautions, Impaired UE functional use, Pain, Increased edema, Decreased range of motion, Decreased mobility, Decreased strength  Visit Diagnosis: Abnormal posture  Aftercare following surgery for neoplasm  Postmastectomy lymphedema  Stiffness of left shoulder, not elsewhere classified  Stiffness of right shoulder, not elsewhere classified  Pain in left upper arm  Pain in right upper arm  Malignant neoplasm of axillary tail of right breast in female, estrogen receptor positive ([place])  Localized edema     Problem  List Patient Active Problem List   Diagnosis Date Noted  . HCAP (healthcare-associated pneumonia) 01/13/2018  . Pleural effusion 01/13/2018  . Breast cancer metastasized to axillary lymph node, right ([place]) 06/05/2017  . Genetic testing 05/27/2017  . Malignant neoplasm of axillary tail of right breast in female, estrogen receptor positive ([place]) 05/09/2017  . Cubital tunnel syndrome on left 10/02/2015  . Trigger finger, left index finger 04/12/2015  . Erroneous encounter - disregard 09/22/2014  . Diabetes type 1, controlled ([place]) 01/12/2013   Annia Friendly, PT 01/22/18 12:03 PM  [place], [place], [address] Phone: [phone]   Fax:  [phone]  Name: Stephanie Holden MRN: [id number] Date of Birth: 1975-10-22

## 2018-01-23 ENCOUNTER — Ambulatory Visit
Admission: RE | Admit: 2018-01-23 | Discharge: 2018-01-23 | Disposition: A | Discharge status: Home / Self Care | Payer: BLUE CROSS/BLUE SHIELD | Source: Ambulatory Visit | Attending: Radiation Oncology | Admitting: Radiation Oncology

## 2018-01-23 DIAGNOSIS — R0609 Other forms of dyspnea: Secondary | ICD-10-CM | POA: Diagnosis not present

## 2018-01-23 DIAGNOSIS — C50611 Malignant neoplasm of axillary tail of right female breast: Secondary | ICD-10-CM | POA: Diagnosis not present

## 2018-01-23 DIAGNOSIS — J189 Pneumonia, unspecified organism: Secondary | ICD-10-CM | POA: Diagnosis not present

## 2018-01-23 DIAGNOSIS — Z510 Encounter for antineoplastic radiation therapy: Secondary | ICD-10-CM | POA: Diagnosis not present

## 2018-01-23 DIAGNOSIS — C50411 Malignant neoplasm of upper-outer quadrant of right female breast: Secondary | ICD-10-CM | POA: Diagnosis not present

## 2018-01-23 DIAGNOSIS — Z170 Estrogen receptor positive status [ER+]: Secondary | ICD-10-CM | POA: Diagnosis not present

## 2018-01-23 DIAGNOSIS — E1065 Type 1 diabetes mellitus with hyperglycemia: Secondary | ICD-10-CM | POA: Diagnosis not present

## 2018-01-26 ENCOUNTER — Ambulatory Visit
Admission: RE | Admit: 2018-01-26 | Discharge: 2018-01-26 | Disposition: A | Discharge status: Home / Self Care | Payer: BLUE CROSS/BLUE SHIELD | Source: Ambulatory Visit | Attending: Radiation Oncology | Admitting: Radiation Oncology

## 2018-01-26 DIAGNOSIS — C50611 Malignant neoplasm of axillary tail of right female breast: Secondary | ICD-10-CM | POA: Diagnosis not present

## 2018-01-27 ENCOUNTER — Emergency Department (HOSPITAL_COMMUNITY): Payer: BLUE CROSS/BLUE SHIELD

## 2018-01-27 ENCOUNTER — Ambulatory Visit
Admission: RE | Admit: 2018-01-27 | Discharge: 2018-01-27 | Disposition: A | Discharge status: Home / Self Care | Payer: BLUE CROSS/BLUE SHIELD | Source: Ambulatory Visit | Attending: Internal Medicine | Admitting: Internal Medicine

## 2018-01-27 ENCOUNTER — Other Ambulatory Visit: Payer: Self-pay

## 2018-01-27 ENCOUNTER — Ambulatory Visit
Admission: RE | Admit: 2018-01-27 | Discharge: 2018-01-27 | Disposition: A | Discharge status: Home / Self Care | Payer: BLUE CROSS/BLUE SHIELD | Source: Ambulatory Visit | Attending: Radiation Oncology | Admitting: Radiation Oncology

## 2018-01-27 ENCOUNTER — Other Ambulatory Visit: Payer: Self-pay | Admitting: Internal Medicine

## 2018-01-27 ENCOUNTER — Ambulatory Visit: Payer: BLUE CROSS/BLUE SHIELD | Attending: Radiation Oncology

## 2018-01-27 ENCOUNTER — Encounter (HOSPITAL_COMMUNITY): Payer: Self-pay | Admitting: Emergency Medicine

## 2018-01-27 ENCOUNTER — Inpatient Hospital Stay (HOSPITAL_COMMUNITY)
Admission: EM | Admit: 2018-01-27 | Discharge: 2018-02-01 | DRG: 291 | Disposition: A | Discharge status: Home / Self Care | Payer: BLUE CROSS/BLUE SHIELD | Attending: Family Medicine | Admitting: Family Medicine

## 2018-01-27 VITALS — BP 132/90 | HR 110

## 2018-01-27 DIAGNOSIS — J90 Pleural effusion, not elsewhere classified: Secondary | ICD-10-CM | POA: Diagnosis present

## 2018-01-27 DIAGNOSIS — D6481 Anemia due to antineoplastic chemotherapy: Secondary | ICD-10-CM | POA: Diagnosis not present

## 2018-01-27 DIAGNOSIS — Z923 Personal history of irradiation: Secondary | ICD-10-CM | POA: Diagnosis not present

## 2018-01-27 DIAGNOSIS — K5900 Constipation, unspecified: Secondary | ICD-10-CM | POA: Diagnosis not present

## 2018-01-27 DIAGNOSIS — Z483 Aftercare following surgery for neoplasm: Secondary | ICD-10-CM | POA: Insufficient documentation

## 2018-01-27 DIAGNOSIS — R293 Abnormal posture: Secondary | ICD-10-CM

## 2018-01-27 DIAGNOSIS — Z853 Personal history of malignant neoplasm of breast: Secondary | ICD-10-CM | POA: Diagnosis not present

## 2018-01-27 DIAGNOSIS — R0600 Dyspnea, unspecified: Secondary | ICD-10-CM

## 2018-01-27 DIAGNOSIS — I972 Postmastectomy lymphedema syndrome: Secondary | ICD-10-CM

## 2018-01-27 DIAGNOSIS — I428 Other cardiomyopathies: Secondary | ICD-10-CM | POA: Diagnosis present

## 2018-01-27 DIAGNOSIS — K219 Gastro-esophageal reflux disease without esophagitis: Secondary | ICD-10-CM | POA: Diagnosis not present

## 2018-01-27 DIAGNOSIS — F329 Major depressive disorder, single episode, unspecified: Secondary | ICD-10-CM | POA: Diagnosis not present

## 2018-01-27 DIAGNOSIS — I509 Heart failure, unspecified: Secondary | ICD-10-CM | POA: Diagnosis not present

## 2018-01-27 DIAGNOSIS — I952 Hypotension due to drugs: Secondary | ICD-10-CM | POA: Diagnosis not present

## 2018-01-27 DIAGNOSIS — Z7951 Long term (current) use of inhaled steroids: Secondary | ICD-10-CM | POA: Diagnosis not present

## 2018-01-27 DIAGNOSIS — I5021 Acute systolic (congestive) heart failure: Principal | ICD-10-CM | POA: Diagnosis present

## 2018-01-27 DIAGNOSIS — Z8249 Family history of ischemic heart disease and other diseases of the circulatory system: Secondary | ICD-10-CM

## 2018-01-27 DIAGNOSIS — C50611 Malignant neoplasm of axillary tail of right female breast: Secondary | ICD-10-CM

## 2018-01-27 DIAGNOSIS — I340 Nonrheumatic mitral (valve) insufficiency: Secondary | ICD-10-CM | POA: Diagnosis not present

## 2018-01-27 DIAGNOSIS — E109 Type 1 diabetes mellitus without complications: Secondary | ICD-10-CM | POA: Diagnosis not present

## 2018-01-27 DIAGNOSIS — Z9689 Presence of other specified functional implants: Secondary | ICD-10-CM | POA: Insufficient documentation

## 2018-01-27 DIAGNOSIS — Z4682 Encounter for fitting and adjustment of non-vascular catheter: Secondary | ICD-10-CM | POA: Diagnosis not present

## 2018-01-27 DIAGNOSIS — Z794 Long term (current) use of insulin: Secondary | ICD-10-CM

## 2018-01-27 DIAGNOSIS — Z9013 Acquired absence of bilateral breasts and nipples: Secondary | ICD-10-CM | POA: Diagnosis not present

## 2018-01-27 DIAGNOSIS — C773 Secondary and unspecified malignant neoplasm of axilla and upper limb lymph nodes: Secondary | ICD-10-CM | POA: Diagnosis present

## 2018-01-27 DIAGNOSIS — Z885 Allergy status to narcotic agent status: Secondary | ICD-10-CM

## 2018-01-27 DIAGNOSIS — E049 Nontoxic goiter, unspecified: Secondary | ICD-10-CM | POA: Diagnosis present

## 2018-01-27 DIAGNOSIS — Z803 Family history of malignant neoplasm of breast: Secondary | ICD-10-CM

## 2018-01-27 DIAGNOSIS — Z79899 Other long term (current) drug therapy: Secondary | ICD-10-CM

## 2018-01-27 DIAGNOSIS — J189 Pneumonia, unspecified organism: Secondary | ICD-10-CM | POA: Diagnosis not present

## 2018-01-27 DIAGNOSIS — J948 Other specified pleural conditions: Secondary | ICD-10-CM | POA: Diagnosis not present

## 2018-01-27 DIAGNOSIS — D649 Anemia, unspecified: Secondary | ICD-10-CM | POA: Diagnosis present

## 2018-01-27 DIAGNOSIS — I429 Cardiomyopathy, unspecified: Secondary | ICD-10-CM | POA: Diagnosis not present

## 2018-01-27 DIAGNOSIS — J939 Pneumothorax, unspecified: Secondary | ICD-10-CM | POA: Diagnosis not present

## 2018-01-27 DIAGNOSIS — J9601 Acute respiratory failure with hypoxia: Secondary | ICD-10-CM | POA: Diagnosis present

## 2018-01-27 DIAGNOSIS — Z9641 Presence of insulin pump (external) (internal): Secondary | ICD-10-CM | POA: Diagnosis present

## 2018-01-27 DIAGNOSIS — T451X5A Adverse effect of antineoplastic and immunosuppressive drugs, initial encounter: Secondary | ICD-10-CM | POA: Diagnosis not present

## 2018-01-27 DIAGNOSIS — R079 Chest pain, unspecified: Secondary | ICD-10-CM

## 2018-01-27 DIAGNOSIS — J984 Other disorders of lung: Secondary | ICD-10-CM | POA: Diagnosis not present

## 2018-01-27 DIAGNOSIS — I890 Lymphedema, not elsewhere classified: Secondary | ICD-10-CM | POA: Diagnosis not present

## 2018-01-27 DIAGNOSIS — Z9079 Acquired absence of other genital organ(s): Secondary | ICD-10-CM

## 2018-01-27 DIAGNOSIS — Z170 Estrogen receptor positive status [ER+]: Secondary | ICD-10-CM

## 2018-01-27 DIAGNOSIS — Z87891 Personal history of nicotine dependence: Secondary | ICD-10-CM

## 2018-01-27 DIAGNOSIS — M6281 Muscle weakness (generalized): Secondary | ICD-10-CM | POA: Insufficient documentation

## 2018-01-27 LAB — COMPREHENSIVE METABOLIC PANEL
ALK PHOS: 65 U/L (ref 38–126)
ALT: 21 U/L (ref 0–44)
ANION GAP: 7 (ref 5–15)
AST: 23 U/L (ref 15–41)
Albumin: 3.3 g/dL — ABNORMAL LOW (ref 3.5–5.0)
BILIRUBIN TOTAL: 0.8 mg/dL (ref 0.3–1.2)
BUN: 12 mg/dL (ref 6–20)
CALCIUM: 8.8 mg/dL — AB (ref 8.9–10.3)
CO2: 27 mmol/L (ref 22–32)
CREATININE: 0.85 mg/dL (ref 0.44–1.00)
Chloride: 106 mmol/L (ref 98–111)
GFR calc non Af Amer: >60 mL/min (ref >60)
Glucose, Bld: 94 mg/dL (ref 70–99)
Potassium: 3.8 mmol/L (ref 3.5–5.1)
Sodium: 140 mmol/L (ref 135–145)
TOTAL PROTEIN: 6.5 g/dL (ref 6.5–8.1)

## 2018-01-27 LAB — CBC WITH DIFFERENTIAL/PLATELET
Abs Immature Granulocytes: 0.02 10*3/uL (ref 0.00–0.07)
Basophils Absolute: 0.1 10*3/uL (ref 0.0–0.1)
Basophils Relative: 1 %
EOS PCT: 2 %
Eosinophils Absolute: 0.1 10*3/uL (ref 0.0–0.5)
HEMATOCRIT: 37.4 % (ref 36.0–46.0)
HEMOGLOBIN: 11.3 g/dL — AB (ref 12.0–15.0)
Immature Granulocytes: 0 %
LYMPHS ABS: 0.8 10*3/uL (ref 0.7–4.0)
LYMPHS PCT: 12 %
MCH: 26.1 pg (ref 26.0–34.0)
MCHC: 30.2 g/dL (ref 30.0–36.0)
MCV: 86.4 fL (ref 80.0–100.0)
MONOS PCT: 9 %
Monocytes Absolute: 0.6 10*3/uL (ref 0.1–1.0)
Neutro Abs: 5.1 10*3/uL (ref 1.7–7.7)
Neutrophils Relative %: 76 %
Platelets: 424 10*3/uL — ABNORMAL HIGH (ref 150–400)
RBC: 4.33 MIL/uL (ref 3.87–5.11)
RDW: 17.1 % — ABNORMAL HIGH (ref 11.5–15.5)
WBC: 6.7 10*3/uL (ref 4.0–10.5)
nRBC: 0 % (ref 0.0–0.2)

## 2018-01-27 LAB — APTT: aPTT: 28 seconds (ref 24–36)

## 2018-01-27 LAB — PROTIME-INR
INR: 1.11
Prothrombin Time: 14.2 seconds (ref 11.4–15.2)

## 2018-01-27 LAB — MRSA PCR SCREENING: MRSA by PCR: NEGATIVE

## 2018-01-27 LAB — LACTIC ACID, PLASMA: Lactic Acid, Venous: 1 mmol/L (ref 0.5–1.9)

## 2018-01-27 LAB — GLUCOSE, CAPILLARY: GLUCOSE-CAPILLARY: 121 mg/dL — AB (ref 70–99)

## 2018-01-27 MED ORDER — FLUTICASONE FUROATE-VILANTEROL 200-25 MCG/INH IN AEPB
1.0000 | INHALATION_SPRAY | Freq: Every day | RESPIRATORY_TRACT | Status: DC
  Filled 2018-01-27: qty 28

## 2018-01-27 MED ORDER — MORPHINE SULFATE (PF) 4 MG/ML IV SOLN
INTRAVENOUS | Status: AC
  Filled 2018-01-27: qty 1

## 2018-01-27 MED ORDER — VANCOMYCIN HCL 10 G IV SOLR
1500.0000 mg | INTRAVENOUS | Status: AC
  Administered 2018-01-27: 1500 mg via INTRAVENOUS
  Filled 2018-01-27: qty 1500

## 2018-01-27 MED ORDER — MORPHINE SULFATE (PF) 2 MG/ML IV SOLN
2.0000 mg | Freq: Once | INTRAVENOUS | Status: AC
  Administered 2018-01-27: 2 mg via INTRAVENOUS

## 2018-01-27 MED ORDER — LIDOCAINE HCL (PF) 1 % IJ SOLN
5.0000 mL | Freq: Once | INTRAMUSCULAR | Status: AC
  Administered 2018-01-27: 5 mL
  Filled 2018-01-27: qty 30

## 2018-01-27 MED ORDER — INSULIN PUMP
SUBCUTANEOUS | Status: DC
  Filled 2018-01-27: qty 1

## 2018-01-27 MED ORDER — SODIUM CHLORIDE 0.9 % IV SOLN
1.0000 g | Freq: Once | INTRAVENOUS | Status: AC
  Administered 2018-01-27: 1 g via INTRAVENOUS
  Filled 2018-01-27: qty 1

## 2018-01-27 MED ORDER — KETAMINE HCL 50 MG/5ML IJ SOSY
1.0000 mg/kg | PREFILLED_SYRINGE | Freq: Once | INTRAMUSCULAR | Status: AC
  Administered 2018-01-27: 76 mg via INTRAVENOUS
  Filled 2018-01-27: qty 10

## 2018-01-27 MED ORDER — CHLORHEXIDINE GLUCONATE CLOTH 2 % EX PADS: 6.0000 | MEDICATED_PAD | Freq: Once | CUTANEOUS | Status: DC

## 2018-01-27 MED ORDER — LORAZEPAM 0.5 MG PO TABS: 0.5000 mg | ORAL_TABLET | Freq: Four times a day (QID) | ORAL | Status: DC | PRN

## 2018-01-27 MED ORDER — MORPHINE SULFATE (PF) 2 MG/ML IV SOLN: 2.0000 mg | INTRAVENOUS | Status: DC | PRN

## 2018-01-27 MED ORDER — VENLAFAXINE HCL ER 75 MG PO CP24
75.0000 mg | ORAL_CAPSULE | Freq: Every day | ORAL | Status: DC
  Filled 2018-01-27: qty 1

## 2018-01-27 MED ORDER — SODIUM CHLORIDE 0.9 % IV BOLUS
500.0000 mL | Freq: Once | INTRAVENOUS | Status: AC
  Administered 2018-01-27: 500 mL via INTRAVENOUS

## 2018-01-27 MED ORDER — ACETAMINOPHEN 500 MG PO TABS: 1000.0000 mg | ORAL_TABLET | ORAL | Status: DC

## 2018-01-27 MED ORDER — ONDANSETRON HCL 4 MG PO TABS: 4.0000 mg | ORAL_TABLET | Freq: Four times a day (QID) | ORAL | Status: DC | PRN

## 2018-01-27 MED ORDER — METRONIDAZOLE IN NACL 5-0.79 MG/ML-% IV SOLN
500.0000 mg | Freq: Three times a day (TID) | INTRAVENOUS | Status: DC
  Administered 2018-01-28: 500 mg via INTRAVENOUS
  Filled 2018-01-27: qty 100

## 2018-01-27 MED ORDER — KETAMINE HCL 10 MG/ML IJ SOLN
INTRAMUSCULAR | Status: AC | PRN
  Administered 2018-01-27: 30 mg via INTRAVENOUS
  Administered 2018-01-27: 50 mg via INTRAVENOUS
  Administered 2018-01-27: 20 mg via INTRAVENOUS

## 2018-01-27 MED ORDER — GUAIFENESIN-DM 100-10 MG/5ML PO SYRP: 5.0000 mL | ORAL_SOLUTION | ORAL | Status: DC | PRN

## 2018-01-27 MED ORDER — SODIUM CHLORIDE 0.9 % IV SOLN
INTRAVENOUS | Status: DC
  Administered 2018-01-27: 23:00:00 via INTRAVENOUS

## 2018-01-27 MED ORDER — GABAPENTIN 300 MG PO CAPS: 300.0000 mg | ORAL_CAPSULE | ORAL | Status: DC

## 2018-01-27 MED ORDER — LORAZEPAM 2 MG/ML IJ SOLN
2.0000 mg | Freq: Once | INTRAMUSCULAR | Status: AC
  Administered 2018-01-27: 2 mg via INTRAVENOUS

## 2018-01-27 MED ORDER — LIDOCAINE-EPINEPHRINE (PF) 2 %-1:200000 IJ SOLN
20.0000 mL | Freq: Once | INTRAMUSCULAR | Status: AC
  Administered 2018-01-27: 20 mL via INTRADERMAL
  Filled 2018-01-27: qty 20

## 2018-01-27 MED ORDER — CELECOXIB 200 MG PO CAPS
200.0000 mg | ORAL_CAPSULE | ORAL | Status: DC
  Filled 2018-01-27: qty 1

## 2018-01-27 MED ORDER — ACETAMINOPHEN 325 MG PO TABS: 650.0000 mg | ORAL_TABLET | Freq: Four times a day (QID) | ORAL | Status: DC | PRN

## 2018-01-27 MED ORDER — LORAZEPAM 2 MG/ML IJ SOLN
INTRAMUSCULAR | Status: AC
  Administered 2018-01-27: 2 mg via INTRAVENOUS
  Filled 2018-01-27: qty 1

## 2018-01-27 MED ORDER — MORPHINE SULFATE (PF) 4 MG/ML IV SOLN
4.0000 mg | Freq: Once | INTRAVENOUS | Status: AC
  Administered 2018-01-27: 4 mg via INTRAVENOUS
  Filled 2018-01-27: qty 1

## 2018-01-27 MED ORDER — ACETAMINOPHEN 650 MG RE SUPP: 650.0000 mg | Freq: Four times a day (QID) | RECTAL | Status: DC | PRN

## 2018-01-27 MED ORDER — MORPHINE SULFATE (PF) 2 MG/ML IV SOLN
2.0000 mg | Freq: Once | INTRAVENOUS | Status: DC
  Filled 2018-01-27: qty 1

## 2018-01-27 MED ORDER — ONDANSETRON HCL 4 MG/2ML IJ SOLN: 4.0000 mg | Freq: Four times a day (QID) | INTRAMUSCULAR | Status: DC | PRN

## 2018-01-27 MED ORDER — INSULIN ASPART 100 UNIT/ML ~~LOC~~ SOLN: 0.0000 [IU] | Freq: Three times a day (TID) | SUBCUTANEOUS | Status: DC

## 2018-01-27 MED ORDER — CEFAZOLIN SODIUM-DEXTROSE 2-4 GM/100ML-% IV SOLN
2.0000 g | INTRAVENOUS | Status: DC
  Filled 2018-01-27: qty 100

## 2018-01-27 NOTE — Therapy (Signed)
[place], [place], [address] Phone: [phone]   Fax:  [phone]  Physical Therapy Treatment  Patient Details  Name: Stephanie Holden MRN: [id number] Date of Birth: 1976/01/11 Referring Provider (PT): Dr. Eppie Gibson   Encounter Date: 01/27/2018  PT End of Session - 01/27/18 1102    Visit Number  4    Number of Visits  12    Date for PT Re-Evaluation  02/12/18    Authorization Type  Patient only has 30 PT visits per year and as of 01/01/18, she had already used 18.    PT Start Time  1018    PT Stop Time  1100    PT Time Calculation (min)  42 min    Activity Tolerance  Patient tolerated treatment well    Behavior During Therapy  WFL for tasks assessed/performed       Past Medical History:  Diagnosis Date  . Cancer [facility])    Breast cancer - right  . Constipation   . Depression   . Genetic testing 05/27/2017   Breast/GYN panel (23 genes) @ [facility] - No pathogenic mutations detected  . GERD (gastroesophageal reflux disease)   . Insulin dependent type 1 diabetes mellitus ([place])   . Insulin pump in place   . PONV (postoperative nausea and vomiting)   . Stenosing tenosynovitis of thumb 06/2014   right  . Thyroid goiter     Past Surgical History:  Procedure Laterality Date  . AXILLARY LYMPH NODE DISSECTION Right 06/26/2017   Procedure: AXILLARY LYMPH NODE DISSECTION;  Surgeon: [name], MD;  Location: [place];  Service: General;  Laterality: Right;  . CARPAL TUNNEL RELEASE Bilateral 10/28/2013   Procedure: BILATERAL CARPAL TUNNEL RELEASE;  Surgeon: Daryll Brod, MD;  Location: [place];  Service: Orthopedics;  Laterality: Bilateral;  . CESAREAN SECTION  2000; 08/20/2001  . ESSURE TUBAL LIGATION  2005   failed, tubal puncture  . LAPAROSCOPIC TUBAL LIGATION  04/02/2007   removal Essure  . LAPAROSCOPIC UNILATERAL SALPINGECTOMY  2005  . MASTECTOMY W/ SENTINEL NODE BIOPSY Bilateral  06/05/2017   Procedure: BILATERAL MASTECTOMIES  WITH RIGHT SENTINEL LYMPH NODE BIOPSY;  Surgeon: [name], MD;  Location: [facility];  Service: General;  Laterality: Bilateral;  . PORTACATH PLACEMENT Left 06/26/2017   Procedure: INSERTION PORT-A-CATH;  Surgeon: [name], MD;  Location: [facility];  Service: General;  Laterality: Left;  . TRIGGER FINGER RELEASE Left 04/12/2014   Procedure: RELEASE A-1 PULLEY LEFT THUMB;  Surgeon: Daryll Brod, MD;  Location: [facility];  Service: Orthopedics;  Laterality: Left;  . TRIGGER FINGER RELEASE Right 07/19/2014   Procedure: RELEASE TRIGGER FINGER/A-1 PULLEY RIGHT THUMB;  Surgeon: Daryll Brod, MD;  Location: [facility];  Service: Orthopedics;  Laterality: Right;    Vitals:   01/27/18 1022  BP: 132/90  Pulse: (!) 110  SpO2: 95%    Subjective Assessment - 01/27/18 1029    Subjective  I'm feeling a little SOB (4/10) compared to how I normally feel but I think it's just me still recovering from the pneumonia. I have some soreness at the back of my knee but that comes and goes.     Pertinent History  Patient was diagnosed on 05/08/17 with right invasive ductal carcinoma breast cancer. She was seen in 5/18 and found to have a right axillary mass but did not f/u for further testing. She then had a mammogram in 2/19 and was  diagnosed with right breast cancer which had metastasized to the axillary lymph nodes. She had multiple small masses in the lower outer quadrant of her right breast with the largest mass measuring 1.3 cm. It is ER/PR positive and HER2 negative with a Ki67 of 2%. Patient underwent a bilateral mastectomy with right targeted node dissection with 4/7 positive on 06/05/17 for right invasive ductal carcinoma. She then underwent a right axillary node dissection on 06/26/17 with 2/20 positive nodes. Chemo completed 12/09/17 and began radiation 12/30/17.    Patient Stated Goals  Reduce swelling    Currently in Pain?  No/denies                   Outpatient Rehab from [facility]/01/2018 [facility]  Lymphedema Life Impact Scale Total Score  23.53 %           [facility] Adult PT Treatment/Exercise - 01/27/18 0001      Knee/Hip Exercises: Aerobic   Nustep  Level 4, x6 mins with PTA present to monitor pt and vitals (Sp02 98% and HR 106 after) pt did this at slow pace today      Knee/Hip Exercises: Supine   Bridges  Strengthening;Both;2 sets;5 reps    Bridges Limitations  Very challenging today due to SOB and pt reports general weakness    Straight Leg Raises  Strengthening;Right;Left;10 reps    Straight Leg Raises Limitations  VCs to keep quad engaged throughout; Sp02 94% after      Knee/Hip Exercises: Sidelying   Hip ABduction  Strengthening;Right;Left;10 reps    Hip ABduction Limitations  Pt very SOB after each exercise today; this was last performed and her Sp02 was 91% and stayed there for a few mins even with cuing deep breathing and pt was demonstrating increased SOB               PT Short Term Goals - 01/01/18 1100      PT SHORT TERM GOAL #1   Title  Patient will verbalize where and how to be fitted for a night time compression garment.    Time  3    Period  Weeks    Status  New      PT SHORT TERM GOAL #2   Title  Patient will decease right arm swelling at 10 cm proximal to her ulnar styloid process to be </= 24 cm.    Baseline  25 cm    Time  3    Period  Weeks    Status  New      PT SHORT TERM GOAL #3   Title  Patient will decease right arm swelling at her olecranon to be </=26 cm.    Baseline  26.9    Time  3    Period  Weeks    Status  New        PT Long Term Goals - 01/01/18 1102      PT LONG TERM GOAL #2   Title  Patient will decease right arm swelling at her olecranon to be </=25 cm.    Baseline  26.9 cm    Time  6    Period  Weeks    Status  New      PT LONG TERM GOAL #3   Title  Patient will verbalize good understanding of the  Maintenance Phase of treatment for lymphedema.    Baseline  .    Time  6    Status  New  PT LONG TERM GOAL #4   Title  Patient will demonstrate proper technique for performing self manual lymph drainage.    Time  6    Period  Weeks    Status  New            Plan - 01/27/18 1210    Clinical Impression Statement  Pt came in appearing very SOB and reported feeling heaviness in her chest that hasn't been present since she had pneumonia 2 weeks ago. She did want to begin exercising today so started gently with slow pace on Nustep (Sp02 was 97% at that time) and then transitioned to mat exercises. Continued to monitor her Sp02 throughout session and it flucuated from 98 at rest as low as 91 after performing supine SLR by end of session (she progressively was becoming more SOB and demonstrating increased use of accessory muscles by end of session and reporting working harder to breath). She had her inhaler (Albuterol) and used this but reported no change after a few minutes. So called and left a message with her doctors office reporting her symptoms and therapists and pts concerns.     Rehab Potential  Excellent    Clinical Impairments Affecting Rehab Potential  Radiation to right axilla may worsen lymphedema    PT Frequency  2x / week    PT Duration  6 weeks    PT Treatment/Interventions  ADLs/Self Care Home Management;Therapeutic activities;Therapeutic exercise;Patient/family education;Manual techniques;Manual lymph drainage;Scar mobilization;Passive range of motion;DME Instruction    PT Next Visit Plan  Assess how pt is feeling, SOB?Marland Kitchen..did she see doctor? Then if pt doing better can cont NuStep, bil LE strengthening exercises for overall weakness. Manual lymph drainage prn for her Rt UE lymphedema and as her skin allows from radiation.     Consulted and Agree with Plan of Care  Patient       Patient will benefit from skilled therapeutic intervention in order to improve the following  deficits and impairments:  Postural dysfunction, Decreased knowledge of precautions, Impaired UE functional use, Pain, Increased edema, Decreased range of motion, Decreased mobility, Decreased strength  Visit Diagnosis: Abnormal posture  Aftercare following surgery for neoplasm  Postmastectomy lymphedema     Problem List Patient Active Problem List   Diagnosis Date Noted  . HCAP (healthcare-associated pneumonia) 01/13/2018  . Pleural effusion 01/13/2018  . Breast cancer metastasized to axillary lymph node, right ([place]) 06/05/2017  . Genetic testing 05/27/2017  . Malignant neoplasm of axillary tail of right breast in female, estrogen receptor positive ([place]) 05/09/2017  . Cubital tunnel syndrome on left 10/02/2015  . Trigger finger, left index finger 04/12/2015  . Erroneous encounter - disregard 09/22/2014  . Diabetes type 1, controlled ([place]) 01/12/2013    Otelia Limes, PTA 01/27/2018, 12:23 PM  [place], [place], [address] Phone: [phone]   Fax:  [phone]  Name: KIEU QUIGGLE MRN: [id number] Date of Birth: [birth year]

## 2018-01-27 NOTE — ED Provider Notes (Signed)
Medical screening examination/treatment/procedure(s) were conducted as a shared visit with non-physician practitioner(s) and myself.  I personally evaluated the patient during the encounter.  None  42yF with dyspnea. Hx of breast CA currently undergoing radiation. Recently admitted with pneumonia and discharged on 10/25. Continued to feel SOB which further worsened yesterday. CXR ordered by PCP with infiltrates and moderate R pneumothorax.   On exam she appears somewhat anxious but not distressed. Decreased breath sounds on R.  WOB is not significantly increased. Mild tachycardia.   Discussed the need for chest tube. She is extremely anxious about this. It sounds like her recent thoracentesis was not a very pleasant procedure for her. I tried to reassure her but she began crying. Will go ahead an give her some ketamine for her comfort. She will need admitted for ongoing care.   R chest tube placed. Tolerated well aside from emergence reaction from ketamine. I actually didn't aspirate any air. She did drain several hundred cc of pleural fluid though by the time the tube was secured.    CHEST TUBE INSERTION Date/Time: 01/27/2018 8:00 PM Performed by: Virgel Manifold, MD Authorized by: Virgel Manifold, MD   Consent:    Consent obtained:  Verbal and emergent situation   Consent given by:  Patient   Risks discussed:  Bleeding, pain and damage to surrounding structures Pre-procedure details:    Skin preparation:  ChloraPrep   Preparation: Patient was prepped and draped in the usual sterile fashion   Sedation:    Sedation type:  Deep and moderate (conscious) sedation Anesthesia (see MAR for exact dosages):    Anesthesia method:  Local infiltration   Local anesthetic:  Lidocaine 1% w/o epi Procedure details:    Placement location:  R lateral   Scalpel size:  11   Tube size (French): 14.   Tension pneumothorax: no     Tube connected to:  Suction   Drainage characteristics:  Yellow   Suture  material:  0 silk   Dressing:  Petrolatum-impregnated gauze and 4x4 sterile gauze Post-procedure details:    Post-insertion x-ray findings: tube in good position   Comments:     68F pigtail catheter over wire. Yellow pleural fluid aspirated. Tube inserted w/o complication but pt did have emergence reaction with ketamine.  .Sedation Date/Time: 01/27/2018 8:00 PM Performed by: Virgel Manifold, MD Authorized by: Virgel Manifold, MD   Consent:    Consent obtained:  Verbal and emergent situation   Consent given by:  Patient   Risks discussed:  Allergic reaction, inadequate sedation, nausea, vomiting, respiratory compromise necessitating ventilatory assistance and intubation and prolonged sedation necessitating reversal   Alternatives discussed:  Analgesia without sedation and anxiolysis Universal protocol:    Immediately prior to procedure a time out was called: yes   Indications:    Sedation is required to allow for: chest tube.   Procedure necessitating sedation performed by:  Physician performing sedation Pre-sedation assessment:    Time since last food or drink:  .   ASA classification: class 3 - patient with severe systemic disease     Neck mobility: normal     Mouth opening:  3 or more finger widths   Thyromental distance:  3 finger widths   Mallampati score:  II - soft palate, uvula, fauces visible   Pre-sedation assessments completed and reviewed: airway patency, cardiovascular function, mental status, nausea/vomiting, pain level, respiratory function and temperature   Immediate pre-procedure details:    Reassessment: Patient reassessed immediately prior to procedure  Reviewed: vital signs and relevant labs/tests     Verified: bag valve mask available, emergency equipment available, intubation equipment available, IV patency confirmed, oxygen available and suction available   Procedure details (see MAR for exact dosages):    Preoxygenation:  Nasal cannula   Sedation:  Ketamine    Intra-procedure monitoring:  Blood pressure monitoring, cardiac monitor, continuous capnometry, continuous pulse oximetry, frequent LOC assessments and frequent vital sign checks   Intra-procedure events: none     Total Provider sedation time (minutes):  35 Post-procedure details:    Post-sedation assessment completed:  01/27/2018 8:45 PM   Attendance: Constant attendance by certified staff until patient recovered     Recovery: Patient returned to pre-procedure baseline     Post-sedation assessments completed and reviewed: airway patency, cardiovascular function, hydration status, mental status, pain level and respiratory function   Comments:     Pt tolerated procedure well aside from emergence reaction necessitating ativan   CRITICAL CARE Performed by: Virgel Manifold Total critical care time: 35 minutes Critical care time was exclusive of separately billable procedures and treating other patients. Critical care was necessary to treat or prevent imminent or life-threatening deterioration. Critical care was time spent personally by me on the following activities: development of treatment plan with patient and/or surrogate as well as nursing, discussions with consultants, evaluation of patient's response to treatment, examination of patient, obtaining history from patient or surrogate, ordering and performing treatments and interventions, ordering and review of laboratory studies, ordering and review of radiographic studies, pulse oximetry and re-evaluation of patient's condition.  Dg Chest 1 View  Result Date: 01/13/2018 CLINICAL DATA:  Breast cancer.  Followup right thoracentesis. EXAM: CHEST  1 VIEW COMPARISON:  Chest CT 01/12/2018. FINDINGS: No visible pleural fluid on either side on this AP projection. No pneumothorax. Bilateral airspace filling worse on the right than the left consistent with edema or pneumonia. Power port unchanged with its tip in the SVC. IMPRESSION: No visible pleural  effusions. No pneumothorax. Persistent bilateral alveolar filling shadows right more than left. Electronically Signed   By: Nelson Chimes M.D.   On: 01/13/2018 16:54   Dg Chest 2 View  Result Date: 01/27/2018 CLINICAL DATA:  Recent pneumonia and pleural effusions EXAM: CHEST - 2 VIEW COMPARISON:  January 14, 2018 FINDINGS: The heart size and mediastinal contours are stable. Left central venous line is unchanged. Bilateral pneumonias are identified, right worse than left. There is a right pleural effusion. There is interval developed moderate right pneumothorax. The visualized skeletal structures are stable. IMPRESSION: Moderate right pneumothorax noted. Bilateral pneumonias, right worse than left. Right pleural effusion. These results were called by telephone at the time of interpretation on 01/27/2018 at 2:46 pm to Elby Showers RN from Dr. Domenick Gong office, Who verbally acknowledged these results. Mr Ronnald Ramp stated she will call [facility] and give further direction to the patient. Electronically Signed   By: Abelardo Diesel M.D.   On: 01/27/2018 14:48   Dg Chest 2 View  Result Date: 01/14/2018 CLINICAL DATA:  Shortness of breath EXAM: CHEST - 2 VIEW COMPARISON:  01/13/2018, 10/21/2016, CT chest 01/12/2018 FINDINGS: Left-sided central venous port tip over the SVC. Small bilateral pleural effusions. Stable cardiomediastinal silhouette. Similar appearance of right greater than left consolidations and ground-glass opacity. Clips in the right axilla. IMPRESSION: 1. Trace bilateral pleural effusions. 2. No significant interval change than right greater than left consolidations and ground-glass densities. Electronically Signed   By: Madie Reno.D.  On: 01/14/2018 15:51   Ct Angio Chest Pe W Or Wo Contrast  Result Date: 01/12/2018 CLINICAL DATA:  Shortness of breath, heavy chest pain. Recent diagnosis of pneumonia, on antibiotics. History of RIGHT breast cancer in 2019 status post  bilateral mastectomies. Current radiation therapy. EXAM: CT ANGIOGRAPHY CHEST WITH CONTRAST TECHNIQUE: Multidetector CT imaging of the chest was performed using the standard protocol during bolus administration of intravenous contrast. Multiplanar CT image reconstructions and MIPs were obtained to evaluate the vascular anatomy. CONTRAST:  14mL ISOVUE-370 IOPAMIDOL (ISOVUE-370) INJECTION 76% COMPARISON:  Chest CT dated 05/21/2017. FINDINGS: Cardiovascular: There is no pulmonary embolism identified within the main, lobar or segmental pulmonary arteries bilaterally. No thoracic aortic aneurysm or evidence of aortic dissection. Heart size is normal. No pericardial effusion. Mediastinum/Nodes: No mass or enlarged lymph nodes seen within the mediastinum or perihilar regions. Esophagus is unremarkable. Trachea and central bronchi are unremarkable. Lungs/Pleura: Patchy consolidations within both lungs, involving all lobes, RIGHT greater than LEFT, some components with ground-glass opacities. RIGHT pleural effusion, moderate to large in size. LEFT pleural effusion, moderate in size. Upper Abdomen: Limited images of the upper abdomen are unremarkable. Musculoskeletal: No acute or suspicious osseous finding. Review of the MIP images confirms the above findings. IMPRESSION: 1. No pulmonary embolism. 2. Patchy consolidations throughout both lungs, involving all lobes, RIGHT greater than LEFT, some components with ground glass opacities. Differential includes atypical pneumonias such as viral or fungal, interstitial pneumonias, edema related to volume overload/CHF, chronic interstitial diseases, hypersensitivity pneumonitis, and respiratory bronchiolitis. Given the history of current radiation, findings could also represent radiation induced pneumonitis or a combination of pneumonia and radiation pneumonitis. 3. RIGHT pleural effusion, moderate to large in size. 4. LEFT pleural effusion, moderate in size. Electronically Signed    By: Franki Cabot M.D.   On: 01/12/2018 14:13   Dg Chest Portable 1 View  Result Date: 01/27/2018 CLINICAL DATA:  Initial evaluation for chest tube placement. EXAM: PORTABLE CHEST 1 VIEW COMPARISON:  Prior radiograph from earlier the same day. FINDINGS: Cardiomegaly, stable. Mediastinal silhouette within normal limits. Left-sided Port-A-Cath remains in place. Interval placement of pigtail right-sided chest tube with tip overlying the mid lower right lung. There is a trace apical pneumothorax at the right lung apex, pleural edge at the third intercostal space. Patchy bibasilar opacities, right greater than left again seen, stable from previous. No appreciable pleural effusion. Underlying pulmonary vascular congestion without overt pulmonary edema. Osseous structures unchanged. IMPRESSION: 1. Interval placement of pigtail right-sided chest tube with tip overlying the mid right lower lobe. Tiny right apical pneumothorax as above. 2. Otherwise no significant interval change in multifocal bilateral airspace opacities, right greater than left. Electronically Signed   By: Jeannine Boga M.D.   On: 01/27/2018 20:57   US Thoracentesis Asp Pleural Space W/img Guide  Result Date: 01/13/2018 INDICATION: Patient with history of breast cancer, dyspnea, bilateral pleural effusions right greater than left. Request received for diagnostic and therapeutic right thoracentesis EXAM: ULTRASOUND GUIDED DIAGNOSTIC AND THERAPEUTIC RIGHT THORACENTESIS MEDICATIONS: None COMPLICATIONS: None immediate. PROCEDURE: An ultrasound guided thoracentesis was thoroughly discussed with the patient and questions answered. The benefits, risks, alternatives and complications were also discussed. The patient understands and wishes to proceed with the procedure. Written consent was obtained. Ultrasound was performed to localize and mark an adequate pocket of fluid in the right chest. The area was then prepped and draped in the normal sterile  fashion. 1% Lidocaine was used for local anesthesia. Under ultrasound guidance a 6 Fr  Safe-T-Centesis catheter was introduced. Thoracentesis was performed. The catheter was removed and a dressing applied. FINDINGS: A total of approximately 930 cc of slightly hazy, yellow fluid was removed. Samples were sent to the laboratory as requested by the clinical team. IMPRESSION: Successful ultrasound guided diagnostic and therapeutic right thoracentesis yielding 930 cc of pleural fluid. Read by: Rowe Robert, PA-C Electronically Signed   By: Jerilynn Mages.  Shick M.D.   On: 01/13/2018 16:29     Virgel Manifold, MD 01/27/18 2121

## 2018-01-27 NOTE — Progress Notes (Signed)
A consult was received from an ED physician for Vancomycin per pharmacy dosing.  The patient's profile has been reviewed for ht/wt/allergies/indication/available labs.   A one time order has been placed for Vancomycin 1500mg  (20 mg/kg) IV x 1 dose.  Further antibiotics/pharmacy consults should be ordered by admitting physician if indicated.                       Thank you, Everette Rank, PharmD 01/27/2018  8:39 PM

## 2018-01-27 NOTE — Consult Note (Signed)
Name: Stephanie Holden MRN: [id number] DOB: 11-11-1975    ADMISSION DATE:  01/27/2018 CONSULTATION DATE: January 27, 2018  REFERRING MD : ED staff  CHIEF COMPLAINT: Shortness of breath  BRIEF PATIENT DESCRIPTION: Patient with breast cancer bilateral mastectomy status post chemoradiation with question of consolidation H CAP that was treated 2 weeks ago with worsening shortness of breath and pleural effusion     HISTORY OF PRESENT ILLNESS: Unable to take history from patient 42 years old Caucasian female status post bilateral mastectomy chemoradiation still undergoing chemo presents with shortness of breath worsening she was admitted end of October with H CAP treated for that continued to have more shortness of breath came to the ED chest x-ray questionable pneumothorax chest tube went and no evidence of air bubbling 1300 cc of clear fluid came out patient is completely start waking up from ketamine and making very emotional violent unable to provide any history was consulted to provide maintenance for the chest tube.  PAST MEDICAL HISTORY :   has a past medical history of Cancer ([place]), Constipation, Depression, Genetic testing (05/27/2017), GERD (gastroesophageal reflux disease), Insulin dependent type 1 diabetes mellitus ([place]), Insulin pump in place, PONV (postoperative nausea and vomiting), Stenosing tenosynovitis of thumb (06/2014), and Thyroid goiter.  has a past surgical history that includes Cesarean section (2000; 08/20/2001); Essure tubal ligation (2005); Laparoscopic unilateral salpingectomy (2005); Laparoscopic tubal ligation (04/02/2007); Carpal tunnel release (Bilateral, 10/28/2013); Trigger finger release (Left, 04/12/2014); Trigger finger release (Right, 07/19/2014); Mastectomy w/ sentinel node biopsy (Bilateral, 06/05/2017); Portacath placement (Left, 06/26/2017); and Axillary lymph node dissection (Right, 06/26/2017). Prior to Admission medications   Medication Sig Start Date End Date Taking?  Authorizing Provider  albuterol (PROVENTIL HFA;VENTOLIN HFA) 108 (90 Base) MCG/ACT inhaler Inhale 1-2 puffs into the lungs every 6 (six) hours as needed for wheezing or shortness of breath.   Yes [provider]  fluticasone furoate-vilanterol (BREO ELLIPTA) 200-25 MCG/INH AEPB Inhale 1 puff into the lungs daily.   Yes [provider]  guaiFENesin-dextromethorphan (ROBITUSSIN DM) 100-10 MG/5ML syrup Take 5 mLs by mouth every 4 (four) hours as needed for cough. 01/16/18  Yes Shelly Coss, MD  hyaluronate sodium (RADIAPLEXRX) GEL Apply 1 application topically once.   Yes [provider]  insulin aspart (NOVOLOG) 100 UNIT/ML injection Inject 45 Units into the skin See admin instructions. Use via insulin pump up to 45 units daily   Yes [provider]  LORazepam (ATIVAN) 0.5 MG tablet TAKE 1 TABLET BY MOUTH EVERY 6 HOURS AS NEEDED FOR ANXIETY OR SLEEP Patient taking differently: Take 0.5 mg by mouth every 6 (six) hours as needed for anxiety or sleep.  12/23/17  Yes Nicholas Lose, MD  meloxicam (MOBIC) 7.5 MG tablet Take 7.5 mg by mouth daily.   Yes [provider]  oxyCODONE-acetaminophen (PERCOCET/ROXICET) 5-325 MG tablet Take 1 tablet by mouth every 8 (eight) hours as needed for severe pain. 12/22/17  Yes Nicholas Lose, MD  Probiotic Product (PROBIOTIC DAILY PO) Take 1 capsule by mouth at bedtime.    Yes [provider]  venlafaxine XR (EFFEXOR-XR) 75 MG 24 hr capsule Take 1 capsule (75 mg total) by mouth daily with breakfast. Patient taking differently: Take 75 mg by mouth.  11/18/17  Yes Nicholas Lose, MD  lidocaine-prilocaine (EMLA) cream Apply to affected area once Patient not taking: Reported on 01/01/2018 06/20/17   Nicholas Lose, MD  ondansetron (ZOFRAN) 8 MG tablet Take 1 tablet (8 mg total) by mouth 2 (two) times  daily as needed. Start on the third day after chemotherapy. Patient not taking: Reported on 01/01/2018 06/20/17   Nicholas Lose, MD    Allergies  Allergen Reactions  . Dilaudid [Hydromorphone Hcl] Itching and Other (See Comments)    UNCONTROLLABLE CRYING    FAMILY HISTORY:  family history includes Breast cancer (age of onset: 66) in her other; Hypertension in her father and mother. SOCIAL HISTORY:  reports that she quit smoking about 16 years ago. She has never used smokeless tobacco. She reports that she drank alcohol. She reports that she does not use drugs.  REVIEW OF SYSTEMS:   Unable to obtain due to patient's conditions.  SUBJECTIVE:   VITAL SIGNS: Temp:  [98.2 F (36.8 C)] 98.2 F (36.8 C) (11/05 1507) Pulse Rate:  [108-125] 117 (11/05 2006) Resp:  [18-36] 19 (11/05 2006) BP: (125-172)/(83-121) 129/100 (11/05 2006) SpO2:  [94 %-100 %] 96 % (11/05 2006) Weight:  [76.2 kg] 76.2 kg (11/05 1507)  PHYSICAL EXAMINATION: General: Severe agitation crying loudly waking up from sedation ketamine Neuro: Moving all extremities without limitations HEENT:  atraumatic , no jaundice , dry mucous membranes  Cardiovascular:  Irregular irregular , ESM 2/6 in the aortic area  Lungs:  CTA bilateral , no wheezing or crackles .  No air bubbles and extending to 1300 cc of pleural fluid looks clear Abdomen:  Soft lax +BS , no tenderness . Musculoskeletal:  WNL , normal pulses  Skin:  No rash   Recent Labs  Lab 01/27/18 1619  NA 140  K 3.8  CL 106  CO2 27  BUN 12  CREATININE 0.85  GLUCOSE 94   Recent Labs  Lab 01/27/18 1619  HGB 11.3*  HCT 37.4  WBC 6.7  PLT 424*   Dg Chest 2 View  Result Date: 01/27/2018 CLINICAL DATA:  Recent pneumonia and pleural effusions EXAM: CHEST - 2 VIEW COMPARISON:  January 14, 2018 FINDINGS: The heart size and mediastinal contours are stable. Left central venous line is unchanged. Bilateral pneumonias are identified, right worse than left. There is a right pleural effusion. There is interval developed moderate right pneumothorax. The visualized skeletal structures are stable.  IMPRESSION: Moderate right pneumothorax noted. Bilateral pneumonias, right worse than left. Right pleural effusion. These results were called by telephone at the time of interpretation on 01/27/2018 at 2:46 pm to Elby Showers RN from Dr. Domenick Gong office, Who verbally acknowledged these results. Mr Ronnald Ramp stated she will call [phone] and give further direction to the patient. Electronically Signed   By: Abelardo Diesel M.D.   On: 01/27/2018 14:48    ASSESSMENT / PLAN:  --Status post chest tube placement for pleural effusion on disagreeing with the chest x-ray read off pneumothorax there is no air bubbles and patient had similar x-ray 2 weeks ago with hyperlucency in the lateral fields but there is still lung markings.  This point continue with drainage a liter at a time and disconnected from negative pressure for 4 hours and reconnect again for another liter clamped and there should not be any clamping the chest tube placed in case patient has pneumothorax so far she had no air bubbles there was no report of air bubbles on the insertion of the needle or the chest tube.  --We will send all cytology Gram stain cultures on the fluid for staging purposes  [facility] Pager: [phone]  01/27/2018, 8:18 PM

## 2018-01-27 NOTE — ED Provider Notes (Signed)
[facility] DEPT Provider Note   CSN: [id number] Arrival date & time: 01/27/18  1502     History   Chief Complaint Chief Complaint  Patient presents with  . Shortness of Breath  . Spontaneous Pneumothorax    HPI LYNSI DOONER is a 42 y.o. female.  HPI   Patient is a 42 year old female with history of right-sided breast cancer who presents emergency department today for evaluation of a pneumothorax.  Patient states that she had imaging done at [facility] radiology prior to arrival and was told she had a pneumothorax and need to come to the ED immediately.  Patient reports that she was admitted the hospital for pneumonia recently and she was discharged on 01/16/2018.  During her admission she had a thoracentesis.  X-ray following that procedure did not show a pneumothorax.  She was discharged and states that she has had shortness of breath since her discharge, but she treated to her pneumonia.  States yesterday she became progressively more short of breath.  Today she had radiation and was at physical therapy, and was told by her physical therapist that she should follow-up with her PCP.  She contacted PCP who sent her to [facility] radiology for chest x-ray during which the pneumothorax was found.  She states she has had a progressive cough since she was discharged in the hospital.  She denies chest pain.  She has had some bilateral lower extremity swelling since she was discharged from the hospital, more so on the right.  Denies any fevers.  She is not currently on chemotherapy.  Oncology: Dr. Sonny Dandy  Past Medical History:  Diagnosis Date  . Cancer [facility])    Breast cancer - right  . Constipation   . Depression   . Genetic testing 05/27/2017   Breast/GYN panel (23 genes) @ [facility] - No pathogenic mutations detected  . GERD (gastroesophageal reflux disease)   . Insulin dependent type 1 diabetes mellitus ([place])   . Insulin pump in place   . PONV  (postoperative nausea and vomiting)   . Stenosing tenosynovitis of thumb 06/2014   right  . Thyroid goiter     Patient Active Problem List   Diagnosis Date Noted  . Acute respiratory failure with hypoxia ([place]) 01/27/2018  . Anemia 01/27/2018  . Chest tube in place   . HCAP (healthcare-associated pneumonia) 01/13/2018  . Pleural effusion 01/13/2018  . Breast cancer metastasized to axillary lymph node, right ([place]) 06/05/2017  . Genetic testing 05/27/2017  . Malignant neoplasm of axillary tail of right breast in female, estrogen receptor positive ([place]) 05/09/2017  . Cubital tunnel syndrome on left 10/02/2015  . Trigger finger, left index finger 04/12/2015  . Erroneous encounter - disregard 09/22/2014  . Diabetes type 1, controlled ([place]) 01/12/2013    Past Surgical History:  Procedure Laterality Date  . AXILLARY LYMPH NODE DISSECTION Right 06/26/2017   Procedure: AXILLARY LYMPH NODE DISSECTION;  Surgeon: [name], MD;  Location: [place];  Service: General;  Laterality: Right;  . CARPAL TUNNEL RELEASE Bilateral 10/28/2013   Procedure: BILATERAL CARPAL TUNNEL RELEASE;  Surgeon: Daryll Brod, MD;  Location: [place];  Service: Orthopedics;  Laterality: Bilateral;  . CESAREAN SECTION  2000; 08/20/2001  . ESSURE TUBAL LIGATION  2005   failed, tubal puncture  . LAPAROSCOPIC TUBAL LIGATION  04/02/2007   removal Essure  . LAPAROSCOPIC UNILATERAL SALPINGECTOMY  2005  . MASTECTOMY W/ SENTINEL NODE BIOPSY Bilateral 06/05/2017   Procedure: BILATERAL MASTECTOMIES  WITH  RIGHT SENTINEL LYMPH NODE BIOPSY;  Surgeon: [name], MD;  Location: [place];  Service: General;  Laterality: Bilateral;  . PORTACATH PLACEMENT Left 06/26/2017   Procedure: INSERTION PORT-A-CATH;  Surgeon: [name], MD;  Location: [place];  Service: General;  Laterality: Left;  . TRIGGER FINGER RELEASE Left 04/12/2014   Procedure: RELEASE A-1 PULLEY LEFT THUMB;  Surgeon: Daryll Brod, MD;  Location: [place];  Service: Orthopedics;  Laterality: Left;  . TRIGGER FINGER RELEASE Right 07/19/2014   Procedure: RELEASE TRIGGER FINGER/A-1 PULLEY RIGHT THUMB;  Surgeon: Daryll Brod, MD;  Location: [place];  Service: Orthopedics;  Laterality: Right;     OB History    Gravida  3   Para  2   Term  1   Preterm  1   AB  1   Living  2     SAB  1   TAB      Ectopic      Multiple      Live Births               Home Medications    Prior to Admission medications   Medication Sig Start Date End Date Taking? Authorizing Provider  albuterol (PROVENTIL HFA;VENTOLIN HFA) 108 (90 Base) MCG/ACT inhaler Inhale 1-2 puffs into the lungs every 6 (six) hours as needed for wheezing or shortness of breath.   Yes [provider]  fluticasone furoate-vilanterol (BREO ELLIPTA) 200-25 MCG/INH AEPB Inhale 1 puff into the lungs daily.   Yes [provider]  guaiFENesin-dextromethorphan (ROBITUSSIN DM) 100-10 MG/5ML syrup Take 5 mLs by mouth every 4 (four) hours as needed for cough. 01/16/18  Yes Shelly Coss, MD  hyaluronate sodium (RADIAPLEXRX) GEL Apply 1 application topically once.   Yes [provider]  insulin aspart (NOVOLOG) 100 UNIT/ML injection Inject 45 Units into the skin See admin instructions. Use via insulin pump up to 45 units daily   Yes [provider]  LORazepam (ATIVAN) 0.5 MG tablet TAKE 1 TABLET BY MOUTH EVERY 6 HOURS AS NEEDED FOR ANXIETY OR SLEEP Patient taking differently: Take 0.5 mg by mouth every 6 (six) hours as needed for anxiety or sleep.  12/23/17  Yes Nicholas Lose, MD  meloxicam (MOBIC) 7.5 MG tablet Take 7.5 mg by mouth daily.   Yes [provider]  oxyCODONE-acetaminophen (PERCOCET/ROXICET) 5-325 MG tablet Take 1 tablet by mouth every 8 (eight) hours as needed for severe pain. 12/22/17  Yes Nicholas Lose, MD  Probiotic Product (PROBIOTIC DAILY PO) Take 1 capsule by mouth at bedtime.    Yes [provider]  venlafaxine XR (EFFEXOR-XR) 75 MG 24 hr capsule Take 1 capsule (75 mg total) by mouth daily with breakfast. Patient taking differently: Take 75 mg by mouth.  11/18/17  Yes Nicholas Lose, MD  lidocaine-prilocaine (EMLA) cream Apply to affected area once Patient not taking: Reported on 01/01/2018 06/20/17   Nicholas Lose, MD  ondansetron (ZOFRAN) 8 MG tablet Take 1 tablet (8 mg total) by mouth 2 (two) times daily as needed. Start on the third day after chemotherapy. Patient not taking: Reported on 01/01/2018 06/20/17   Nicholas Lose, MD    Family History Family History  Problem Relation Age of Onset  . Hypertension Mother   . Hypertension Father   . Breast cancer Other 79       paternal grandfather's niece    Social History Social History   Tobacco Use  . Smoking status: Former Smoker  Last attempt to quit: 03/24/2001    Years since quitting: 16.8  . Smokeless tobacco: Never Used  Substance Use Topics  . Alcohol use: Not Currently  . Drug use: No     Allergies   Dilaudid [hydromorphone hcl]   Review of Systems Review of Systems  Constitutional: Negative for chills and fever.  HENT: Negative for congestion.   Eyes: Negative for visual disturbance.  Respiratory: Positive for cough and shortness of breath.   Cardiovascular: Positive for leg swelling. Negative for chest pain.  Gastrointestinal: Negative for abdominal pain, nausea and vomiting.  Genitourinary: Negative for flank pain.  Musculoskeletal: Negative for back pain.  Skin: Negative for rash.  Neurological: Negative for headaches.     Physical Exam Updated Vital Signs BP (!) 99/59   Pulse 97   Temp 98.2 F (36.8 C) (Oral)   Resp (!) 23   Ht 5\' 2"  (1.575 m)   Wt 76.2 kg   SpO2 95%   BMI 30.73 kg/m   Physical Exam  Constitutional: She appears well-developed and well-nourished.  Appears uncomfortable  HENT:  Head: Normocephalic and atraumatic.  Neck: No tracheal deviation present.    Pulmonary/Chest:  Tachypneic, speaking in short sentences, O2 sats 98-100% on 3L. Lungs crossly clear on the left. Decreased breath sounds on the right moreso to the lower lobe.  Abdominal: Soft. Bowel sounds are normal. There is no tenderness.  Musculoskeletal:       Right lower leg: Normal. She exhibits edema.       Left lower leg: Normal. She exhibits edema.  Nursing note and vitals reviewed.    ED Treatments / Results  Labs (all labs ordered are listed, but only abnormal results are displayed) Labs Reviewed  CBC WITH DIFFERENTIAL/PLATELET - Abnormal; Notable for the following components:      Result Value   Hemoglobin 11.3 (*)    RDW 17.1 (*)    Platelets 424 (*)    All other components within normal limits  COMPREHENSIVE METABOLIC PANEL - Abnormal; Notable for the following components:   Calcium 8.8 (*)    Albumin 3.3 (*)    All other components within normal limits  GLUCOSE, CAPILLARY - Abnormal; Notable for the following components:   Glucose-Capillary 121 (*)    All other components within normal limits  GRAM STAIN  MRSA PCR SCREENING  BODY FLUID CELL COUNT WITH DIFFERENTIAL  CBC WITH DIFFERENTIAL/PLATELET  COMPREHENSIVE METABOLIC PANEL  LACTIC ACID, PLASMA  LACTIC ACID, PLASMA  PROCALCITONIN  PROTIME-INR  APTT  CYTOLOGY - NON PAP    EKG None  Radiology Dg Chest 2 View  Result Date: 01/27/2018 CLINICAL DATA:  Recent pneumonia and pleural effusions EXAM: CHEST - 2 VIEW COMPARISON:  January 14, 2018 FINDINGS: The heart size and mediastinal contours are stable. Left central venous line is unchanged. Bilateral pneumonias are identified, right worse than left. There is a right pleural effusion. There is interval developed moderate right pneumothorax. The visualized skeletal structures are stable. IMPRESSION: Moderate right pneumothorax noted. Bilateral pneumonias, right worse than left. Right pleural effusion. These results were called by telephone at the time of  interpretation on 01/27/2018 at 2:46 pm to Elby Showers RN from Dr. Domenick Gong office, Who verbally acknowledged these results. Mr Ronnald Ramp stated she will call [phone] and give further direction to the patient. Electronically Signed   By: Abelardo Diesel M.D.   On: 01/27/2018 14:48   Dg Chest Portable 1 View  Result Date: 01/27/2018 CLINICAL  DATA:  Initial evaluation for chest tube placement. EXAM: PORTABLE CHEST 1 VIEW COMPARISON:  Prior radiograph from earlier the same day. FINDINGS: Cardiomegaly, stable. Mediastinal silhouette within normal limits. Left-sided Port-A-Cath remains in place. Interval placement of pigtail right-sided chest tube with tip overlying the mid lower right lung. There is a trace apical pneumothorax at the right lung apex, pleural edge at the third intercostal space. Patchy bibasilar opacities, right greater than left again seen, stable from previous. No appreciable pleural effusion. Underlying pulmonary vascular congestion without overt pulmonary edema. Osseous structures unchanged. IMPRESSION: 1. Interval placement of pigtail right-sided chest tube with tip overlying the mid right lower lobe. Tiny right apical pneumothorax as above. 2. Otherwise no significant interval change in multifocal bilateral airspace opacities, right greater than left. Electronically Signed   By: Jeannine Boga M.D.   On: 01/27/2018 20:57    Procedures Procedures (including critical care time) CRITICAL CARE Performed by: Rodney Booze   Total critical care time: 45 minutes  Critical care time was exclusive of separately billable procedures and treating other patients.  Critical care was necessary to treat or prevent imminent or life-threatening deterioration.  Critical care was time spent personally by me on the following activities: development of treatment plan with patient and/or surrogate as well as nursing, discussions with consultants, evaluation of patient's  response to treatment, examination of patient, obtaining history from patient or surrogate, ordering and performing treatments and interventions, ordering and review of laboratory studies, ordering and review of radiographic studies, pulse oximetry and re-evaluation of patient's condition.   Medications Ordered in ED Medications  ceFEPIme (MAXIPIME) 1 g in sodium chloride 0.9 % 100 mL IVPB (1 g Intravenous New Bag/Given 01/27/18 2313)  vancomycin (VANCOCIN) 1,500 mg in sodium chloride 0.9 % 500 mL IVPB (1,500 mg Intravenous New Bag/Given 01/27/18 2311)  celecoxib (CELEBREX) capsule 200 mg (has no administration in time range)  gabapentin (NEURONTIN) capsule 300 mg (has no administration in time range)  LORazepam (ATIVAN) tablet 0.5 mg (has no administration in time range)  venlafaxine XR (EFFEXOR-XR) 24 hr capsule 75 mg (75 mg Oral Not Given 01/27/18 2256)  fluticasone furoate-vilanterol (BREO ELLIPTA) 200-25 MCG/INH 1 puff (has no administration in time range)  guaiFENesin-dextromethorphan (ROBITUSSIN DM) 100-10 MG/5ML syrup 5 mL (has no administration in time range)  acetaminophen (TYLENOL) tablet 650 mg (has no administration in time range)    Or  acetaminophen (TYLENOL) suppository 650 mg (has no administration in time range)  ondansetron (ZOFRAN) tablet 4 mg (has no administration in time range)    Or  ondansetron (ZOFRAN) injection 4 mg (has no administration in time range)  metroNIDAZOLE (FLAGYL) IVPB 500 mg (has no administration in time range)  0.9 %  sodium chloride infusion ( Intravenous New Bag/Given 01/27/18 2259)  morphine 2 MG/ML injection 2 mg (has no administration in time range)  insulin aspart (novoLOG) injection 0-15 Units (has no administration in time range)  lidocaine (PF) (XYLOCAINE) 1 % injection 5 mL (5 mLs Other Given by Other 01/27/18 2022)  lidocaine-EPINEPHrine (XYLOCAINE W/EPI) 2 %-1:200000 (PF) injection 20 mL (20 mLs Intradermal Given by Other 01/27/18 1814)    ketamine 50 mg in normal saline 5 mL (10 mg/mL) syringe (76 mg Intravenous Given 01/27/18 1810)  ketamine (KETALAR) injection (20 mg Intravenous Given 01/27/18 1950)  LORazepam (ATIVAN) injection 2 mg (2 mg Intravenous Given 01/27/18 2030)  morphine 4 MG/ML injection 4 mg (4 mg Intravenous Given 01/27/18 2058)  sodium chloride 0.9 % bolus 500  mL (0 mLs Intravenous Stopped 01/27/18 2259)  morphine 2 MG/ML injection 2 mg (2 mg Intravenous Given 01/27/18 2254)     Initial Impression / Assessment and Plan / ED Course  I have reviewed the triage vital signs and the nursing notes.  Pertinent labs & imaging results that were available during my care of the patient were reviewed by me and considered in my medical decision making (see chart for details).   Final Clinical Impressions(s) / ED Diagnoses   Final diagnoses:  Pleural effusion  HCAP (healthcare-associated pneumonia)   Patient is a 42 year old female with history of right-sided breast cancer who presents emergency department today for evaluation of a pneumothorax noted by [facility] radiology PTA. Pt tachycardic and tachypneic. Placed on 3L and tachypnea improved, she is maintaining sats.  Pt reporting that she has felt SOB since she was discharged after being admitted for pneumonia, but for the last 2 days SOB has been worsening. She denies CP. She still has some cough. Denies fevers. Has already completed antibiotic therapy.  CXR with moderate right pneumothorax. Bilateral pneumonias, right worse than left. Right pleural effusion. IV abx given in the ED for HCAP.    4:10 PM CONSULT with Dr. Cyndia Bent, with cardiothoracic surgery, who reviewed the imaging and recommended a pigtail catheter by either IR or here in the ED. He recommended admission to hospitalist service. They will consult on the pt.   Chest tube was placed by Dr. Wilson Singer and there was 750cc of clear/yellow fluid that was obtained in pleurivac. No air significant air was obtained. Pt  agitated after receiving ketamine.  Critical care was consulted by my attending physician, Dr. Wilson Singer who evaluated the pt. They disagree with findings of PTX on CXR, however they agree to consult and manage the chest tube.   Pt admitted to hospitalist service for further management.    ED Discharge Orders    None       Bishop Dublin 01/27/18 2326    Virgel Manifold, MD 01/29/18 1008

## 2018-01-27 NOTE — Sedation Documentation (Signed)
Dr. Wilson Singer has just successfully inserted Pigtail-type chest catheter into her right pleural space with immediate return of ~763ml of amber pleural fluid, which is caught in [employer] device with neg. Pressure set at 20 cm h20 pressure. As I write this, pt. Is actively coughing.

## 2018-01-27 NOTE — ED Notes (Signed)
Called to give report. Nurse states she will call back. 

## 2018-01-27 NOTE — Progress Notes (Signed)
Went to do pt's nursing admission history. Pt. Was unable to answer questions and moaning.Antony Haste RN was  at bedside.  I had stepped back into the hall. Pt's husband entered room so I returned into room and identified myself as admitting nurse to him and told him my name. I asked if he would be able to verify questions on previous admission history. He stated that he could.I explained this was the computer work for the nurses upstairs. Pt. In bed moaning and unable to answer questions.Pt's husband asking numerous questions . I told him I would notify the nurse. I went out in the hall and asked Samuella Cota for nurse to come in. Pt's husband got the cell phone and checked pt's sugar. He put phone up in my face and said the sugar is 172. He loudly said  " if you don't find the nurse then I will gladly go find them myself. I want to know if anyone is monitoring her sugar." I returned out in the hall and notified Samuella Cota as her nurse was not present in hall . Pt's nursing admission  history forwarded from previous history as pt's husband would not answer any of the questions and pt unable to answer them.  Lucius Conn BSN, RN-BC Admissions RN 01/27/2018 8:50 PM

## 2018-01-27 NOTE — H&P (Signed)
History and Physical    Stephanie Holden PPI:[id number] DOB: 04-13-1975 DOA: 01/27/2018  PCP: Haywood Pao, MD  Patient coming from: Home.  Chief Complaint: Shortness of breath.  Abnormal chest x-ray.  HPI: Stephanie Holden is a 42 y.o. female with history of breast cancer status post bilateral mastectomy earlier this year with positive lymph nodes on chemotherapy with Adriamycin and Cytoxan followed by Taxol which was completed in September currently undergoing adjuvant radiation therapy admitted recently for pneumonia during which patient also had thoracentesis done and was discharged on January 16, 2018 2 weeks ago has been having progressive shortness of breath and had radiation therapy this morning following which patient was more short of breath called up her PCP and was instructed to get chest x-ray which showed a severity of pneumothorax and was instructed to come to the ER immediately.  ED Course: In the ER patient was short of breath tachycardic and chest tube was placed by the ER physician.  Critical care was consulted and as per the critical care patient had a large pleural effusion with no evidence of pneumothorax.  Chest x-ray also showed bilateral pneumonia.  Patient was placed on empiric antibiotics and admitted for further management of acute respiratory failure likely from pleural effusion status post chest tube placement and pneumonia.  Patient was mildly hypotensive after getting morphine and Ativan.  Review of Systems: As per HPI, rest all negative.   Past Medical History:  Diagnosis Date  . Cancer [facility])    Breast cancer - right  . Constipation   . Depression   . Genetic testing 05/27/2017   Breast/GYN panel (23 genes) @ [facility] - No pathogenic mutations detected  . GERD (gastroesophageal reflux disease)   . Insulin dependent type 1 diabetes mellitus ([place])   . Insulin pump in place   . PONV (postoperative nausea and vomiting)   . Stenosing tenosynovitis of thumb  06/2014   right  . Thyroid goiter     Past Surgical History:  Procedure Laterality Date  . AXILLARY LYMPH NODE DISSECTION Right 06/26/2017   Procedure: AXILLARY LYMPH NODE DISSECTION;  Surgeon: [name], MD;  Location: [place];  Service: General;  Laterality: Right;  . CARPAL TUNNEL RELEASE Bilateral 10/28/2013   Procedure: BILATERAL CARPAL TUNNEL RELEASE;  Surgeon: Daryll Brod, MD;  Location: [place];  Service: Orthopedics;  Laterality: Bilateral;  . CESAREAN SECTION  2000; 08/20/2001  . ESSURE TUBAL LIGATION  2005   failed, tubal puncture  . LAPAROSCOPIC TUBAL LIGATION  04/02/2007   removal Essure  . LAPAROSCOPIC UNILATERAL SALPINGECTOMY  2005  . MASTECTOMY W/ SENTINEL NODE BIOPSY Bilateral 06/05/2017   Procedure: BILATERAL MASTECTOMIES  WITH RIGHT SENTINEL LYMPH NODE BIOPSY;  Surgeon: [name], MD;  Location: [place];  Service: General;  Laterality: Bilateral;  . PORTACATH PLACEMENT Left 06/26/2017   Procedure: INSERTION PORT-A-CATH;  Surgeon: [name], MD;  Location: [place];  Service: General;  Laterality: Left;  . TRIGGER FINGER RELEASE Left 04/12/2014   Procedure: RELEASE A-1 PULLEY LEFT THUMB;  Surgeon: Daryll Brod, MD;  Location: [place];  Service: Orthopedics;  Laterality: Left;  . TRIGGER FINGER RELEASE Right 07/19/2014   Procedure: RELEASE TRIGGER FINGER/A-1 PULLEY RIGHT THUMB;  Surgeon: Daryll Brod, MD;  Location: [place];  Service: Orthopedics;  Laterality: Right;     reports that she quit smoking about 16 years ago. She has never used smokeless tobacco. She reports that she drank alcohol. She  reports that she does not use drugs.  Allergies  Allergen Reactions  . Dilaudid [Hydromorphone Hcl] Itching and Other (See Comments)    UNCONTROLLABLE CRYING    Family History  Problem Relation Age of Onset  . Hypertension Mother   . Hypertension Father   . Breast cancer Other 36       paternal grandfather's niece    Prior  to Admission medications   Medication Sig Start Date End Date Taking? Authorizing Provider  albuterol (PROVENTIL HFA;VENTOLIN HFA) 108 (90 Base) MCG/ACT inhaler Inhale 1-2 puffs into the lungs every 6 (six) hours as needed for wheezing or shortness of breath.   Yes [provider]  fluticasone furoate-vilanterol (BREO ELLIPTA) 200-25 MCG/INH AEPB Inhale 1 puff into the lungs daily.   Yes [provider]  guaiFENesin-dextromethorphan (ROBITUSSIN DM) 100-10 MG/5ML syrup Take 5 mLs by mouth every 4 (four) hours as needed for cough. 01/16/18  Yes Shelly Coss, MD  hyaluronate sodium (RADIAPLEXRX) GEL Apply 1 application topically once.   Yes [provider]  insulin aspart (NOVOLOG) 100 UNIT/ML injection Inject 45 Units into the skin See admin instructions. Use via insulin pump up to 45 units daily   Yes [provider]  LORazepam (ATIVAN) 0.5 MG tablet TAKE 1 TABLET BY MOUTH EVERY 6 HOURS AS NEEDED FOR ANXIETY OR SLEEP Patient taking differently: Take 0.5 mg by mouth every 6 (six) hours as needed for anxiety or sleep.  12/23/17  Yes Nicholas Lose, MD  meloxicam (MOBIC) 7.5 MG tablet Take 7.5 mg by mouth daily.   Yes [provider]  oxyCODONE-acetaminophen (PERCOCET/ROXICET) 5-325 MG tablet Take 1 tablet by mouth every 8 (eight) hours as needed for severe pain. 12/22/17  Yes Nicholas Lose, MD  Probiotic Product (PROBIOTIC DAILY PO) Take 1 capsule by mouth at bedtime.    Yes [provider]  venlafaxine XR (EFFEXOR-XR) 75 MG 24 hr capsule Take 1 capsule (75 mg total) by mouth daily with breakfast. Patient taking differently: Take 75 mg by mouth.  11/18/17  Yes Nicholas Lose, MD  lidocaine-prilocaine (EMLA) cream Apply to affected area once Patient not taking: Reported on 01/01/2018 06/20/17   Nicholas Lose, MD  ondansetron (ZOFRAN) 8 MG tablet Take 1 tablet (8 mg total) by mouth 2 (two) times daily as needed. Start on the third day after  chemotherapy. Patient not taking: Reported on 01/01/2018 06/20/17   Nicholas Lose, MD    Physical Exam: Vitals:   01/27/18 2018 01/27/18 2030 01/27/18 2100 01/27/18 2115  BP: (!) 144/75 110/87 (!) 99/59   Pulse: (!) 125 (!) 113 (!) 105 97  Resp: (!) 28 (!) 23 (!) 44 (!) 23  Temp:      TempSrc:      SpO2: 95% 97% 97% 95%  Weight:      Height:          Constitutional: Moderately built and nourished. Vitals:   01/27/18 2018 01/27/18 2030 01/27/18 2100 01/27/18 2115  BP: (!) 144/75 110/87 (!) 99/59   Pulse: (!) 125 (!) 113 (!) 105 97  Resp: (!) 28 (!) 23 (!) 44 (!) 23  Temp:      TempSrc:      SpO2: 95% 97% 97% 95%  Weight:      Height:       Eyes: Anicteric no pallor. ENMT: No discharge from the ears eyes nose or mouth. Neck: No mass felt.  No neck rigidity. Respiratory: No rhonchi or crepitations. Cardiovascular: S1-S2 heard no murmurs  appreciated. Abdomen: Soft nontender bowel sounds present. Musculoskeletal: No edema.  No joint effusion. Skin: No rash. Neurologic: Patient is answering questions appropriately but mildly sedated from receiving Ativan and morphine. Psychiatric: Mildly sedated.   Labs on Admission: I have personally reviewed following labs and imaging studies  CBC: Recent Labs  Lab 01/27/18 1619  WBC 6.7  NEUTROABS 5.1  HGB 11.3*  HCT 37.4  MCV 86.4  PLT 254*   Basic Metabolic Panel: Recent Labs  Lab 01/27/18 1619  NA 140  K 3.8  CL 106  CO2 27  GLUCOSE 94  BUN 12  CREATININE 0.85  CALCIUM 8.8*   GFR: Estimated Creatinine Clearance: 83.2 mL/min (by C-G formula based on SCr of 0.85 mg/dL). Liver Function Tests: Recent Labs  Lab 01/27/18 1619  AST 23  ALT 21  ALKPHOS 65  BILITOT 0.8  PROT 6.5  ALBUMIN 3.3*   No results for input(s): LIPASE, AMYLASE in the last 168 hours. No results for input(s): AMMONIA in the last 168 hours. Coagulation Profile: No results for input(s): INR, PROTIME in the last 168 hours. Cardiac  Enzymes: No results for input(s): CKTOTAL, CKMB, CKMBINDEX, TROPONINI in the last 168 hours. BNP (last 3 results) No results for input(s): PROBNP in the last 8760 hours. HbA1C: No results for input(s): HGBA1C in the last 72 hours. CBG: No results for input(s): GLUCAP in the last 168 hours. Lipid Profile: No results for input(s): CHOL, HDL, LDLCALC, TRIG, CHOLHDL, LDLDIRECT in the last 72 hours. Thyroid Function Tests: No results for input(s): TSH, T4TOTAL, FREET4, T3FREE, THYROIDAB in the last 72 hours. Anemia Panel: No results for input(s): VITAMINB12, FOLATE, FERRITIN, TIBC, IRON, RETICCTPCT in the last 72 hours. Urine analysis:    Component Value Date/Time   COLORURINE YELLOW 12/02/2017 1030   APPEARANCEUR HAZY (A) 12/02/2017 1030   LABSPEC 1.023 12/02/2017 1030   PHURINE 5.0 12/02/2017 1030   GLUCOSEU >=500 (A) 12/02/2017 1030   HGBUR MODERATE (A) 12/02/2017 1030   BILIRUBINUR NEGATIVE 12/02/2017 1030   BILIRUBINUR n 09/06/2014 0845   KETONESUR NEGATIVE 12/02/2017 1030   PROTEINUR NEGATIVE 12/02/2017 1030   UROBILINOGEN negative 09/06/2014 0845   NITRITE NEGATIVE 12/02/2017 1030   LEUKOCYTESUR MODERATE (A) 12/02/2017 1030   Sepsis Labs: @LABRCNTIP (procalcitonin:4,lacticidven:4) )No results found for this or any previous visit (from the past 240 hour(s)).   Radiological Exams on Admission: Dg Chest 2 View  Result Date: 01/27/2018 CLINICAL DATA:  Recent pneumonia and pleural effusions EXAM: CHEST - 2 VIEW COMPARISON:  January 14, 2018 FINDINGS: The heart size and mediastinal contours are stable. Left central venous line is unchanged. Bilateral pneumonias are identified, right worse than left. There is a right pleural effusion. There is interval developed moderate right pneumothorax. The visualized skeletal structures are stable. IMPRESSION: Moderate right pneumothorax noted. Bilateral pneumonias, right worse than left. Right pleural effusion. These results were called by  telephone at the time of interpretation on 01/27/2018 at 2:46 pm to Elby Showers RN from Dr. Domenick Gong office, Who verbally acknowledged these results. Mr Ronnald Ramp stated she will call [facility] and give further direction to the patient. Electronically Signed   By: Abelardo Diesel M.D.   On: 01/27/2018 14:48   Dg Chest Portable 1 View  Result Date: 01/27/2018 CLINICAL DATA:  Initial evaluation for chest tube placement. EXAM: PORTABLE CHEST 1 VIEW COMPARISON:  Prior radiograph from earlier the same day. FINDINGS: Cardiomegaly, stable. Mediastinal silhouette within normal limits. Left-sided Port-A-Cath remains in place. Interval placement  of pigtail right-sided chest tube with tip overlying the mid lower right lung. There is a trace apical pneumothorax at the right lung apex, pleural edge at the third intercostal space. Patchy bibasilar opacities, right greater than left again seen, stable from previous. No appreciable pleural effusion. Underlying pulmonary vascular congestion without overt pulmonary edema. Osseous structures unchanged. IMPRESSION: 1. Interval placement of pigtail right-sided chest tube with tip overlying the mid right lower lobe. Tiny right apical pneumothorax as above. 2. Otherwise no significant interval change in multifocal bilateral airspace opacities, right greater than left. Electronically Signed   By: Jeannine Boga M.D.   On: 01/27/2018 20:57    EKG: Independently reviewed.  Sinus tachycardia.  Assessment/Plan Principal Problem:   Acute respiratory failure with hypoxia (HCC) Active Problems:   Diabetes type 1, controlled (HCC)   Malignant neoplasm of axillary tail of right breast in female, estrogen receptor positive (HCC)   Pleural effusion   Anemia    1. Acute respiratory failure with hypoxia secondary to pleural effusion and possible pneumonia.  Status post chest tube placement.  We will continue with empiric antibiotics follow cultures.   Continue hydration. 2. Diabetes mellitus type 1 on insulin pump.  Insulin pump will be continued once patient is more alert awake otherwise may need to place patient on Lantus and sliding scale coverage.  Closely follow CBGs metabolic panel. 3. Anemia likely related to chemotherapy and malignancy.  Follow CBC. 4. Breast cancer being followed by oncologist.   DVT prophylaxis: SCDs for now we will change to Lovenox if no further procedures anticipated. Code Status: Full code. Family Communication: Patient's husband. Disposition Plan: Home. Consults called: Pulmonary critical care. Admission status: Inpatient.   Rise Patience MD [facility] Pager [phone].  If 7PM-7AM, please contact night-coverage [email or web address] Password [email or web address]  01/27/2018, 10:45 PM

## 2018-01-27 NOTE — ED Notes (Signed)
ED TO INPATIENT HANDOFF REPORT  Name/Age/Gender Stephanie Holden 42 y.o. female  Code Status Code Status History    Date Active Date Inactive Code Status Order ID Comments User Context   01/13/2018 1414 01/16/2018 1643 Full Code [id number]  Caren Griffins, MD Inpatient   06/26/2017 1634 06/27/2017 1351 Full Code [id number]  [name], MD Inpatient   06/05/2017 1742 06/06/2017 1521 Full Code [id number]  [name], MD Inpatient      Home/SNF/Other Home  Chief Complaint Moderate Pneumo Thorax  Level of Care/Admitting Diagnosis ED Disposition    ED Disposition Condition [place]: [facility] [[id number]]  Level of Care: Stepdown [14]  Admit to SDU based on following criteria: Respiratory Distress:  Frequent assessment and/or intervention to maintain adequate ventilation/respiration, pulmonary toilet, and respiratory treatment.  Diagnosis: Acute respiratory failure with hypoxia [facility]) [[id number]]  Admitting Physician: Rise Patience [phone]  Attending Physician: Rise Patience [phone]  Estimated length of stay: past midnight tomorrow  Certification:: I certify this patient will need inpatient services for at least 2 midnights  PT Class (Do Not Modify): Inpatient [101]  PT Acc Code (Do Not Modify): Private [1]       Medical History Past Medical History:  Diagnosis Date  . Cancer [facility])    Breast cancer - right  . Constipation   . Depression   . Genetic testing 05/27/2017   Breast/GYN panel (23 genes) @ [facility] - No pathogenic mutations detected  . GERD (gastroesophageal reflux disease)   . Insulin dependent type 1 diabetes mellitus ([place])   . Insulin pump in place   . PONV (postoperative nausea and vomiting)   . Stenosing tenosynovitis of thumb 06/2014   right  . Thyroid goiter     Allergies Allergies  Allergen Reactions  . Dilaudid [Hydromorphone Hcl] Itching and Other (See Comments)    UNCONTROLLABLE CRYING    IV  Location/Drains/Wounds Patient Lines/Drains/Airways Status   Active Line/Drains/Airways    Name:   Placement date:   Placement time:   Site:   Days:   Implanted Port 06/26/17 Left Chest   06/26/17    1343    Chest   215   Peripheral IV 01/27/18 Left Antecubital   01/27/18    1601    Antecubital   less than 1          Labs/Imaging Results for orders placed or performed during the hospital encounter of 01/27/18 (from the past 48 hour(s))  CBC with Differential     Status: Abnormal   Collection Time: 01/27/18  4:19 PM  Result Value Ref Range   WBC 6.7 4.0 - 10.5 K/uL   RBC 4.33 3.87 - 5.11 MIL/uL   Hemoglobin 11.3 (L) 12.0 - 15.0 g/dL   HCT 37.4 36.0 - 46.0 %   MCV 86.4 80.0 - 100.0 fL   MCH 26.1 26.0 - 34.0 pg   MCHC 30.2 30.0 - 36.0 g/dL   RDW 17.1 (H) 11.5 - 15.5 %   Platelets 424 (H) 150 - 400 K/uL   nRBC 0.0 0.0 - 0.2 %   Neutrophils Relative % 76 %   Neutro Abs 5.1 1.7 - 7.7 K/uL   Lymphocytes Relative 12 %   Lymphs Abs 0.8 0.7 - 4.0 K/uL   Monocytes Relative 9 %   Monocytes Absolute 0.6 0.1 - 1.0 K/uL   Eosinophils Relative 2 %   Eosinophils Absolute 0.1 0.0 - 0.5 K/uL   Basophils  Relative 1 %   Basophils Absolute 0.1 0.0 - 0.1 K/uL   Immature Granulocytes 0 %   Abs Immature Granulocytes 0.02 0.00 - 0.07 K/uL    Comment: Performed at [facility], [place] [address], [place], [place] [address]  Comprehensive metabolic panel     Status: Abnormal   Collection Time: 01/27/18  4:19 PM  Result Value Ref Range   Sodium 140 135 - 145 mmol/L   Potassium 3.8 3.5 - 5.1 mmol/L   Chloride 106 98 - 111 mmol/L   CO2 27 22 - 32 mmol/L   Glucose, Bld 94 70 - 99 mg/dL   BUN 12 6 - 20 mg/dL   Creatinine, Ser 0.85 0.44 - 1.00 mg/dL   Calcium 8.8 (L) 8.9 - 10.3 mg/dL   Total Protein 6.5 6.5 - 8.1 g/dL   Albumin 3.3 (L) 3.5 - 5.0 g/dL   AST 23 15 - 41 U/L   ALT 21 0 - 44 U/L   Alkaline Phosphatase 65 38 - 126 U/L   Total Bilirubin 0.8 0.3 - 1.2 mg/dL   GFR calc  non Af Amer >60 >60 mL/min   GFR calc Af Amer >60 >60 mL/min    Comment: (NOTE) The eGFR has been calculated using the CKD EPI equation. This calculation has not been validated in all clinical situations. eGFR's persistently <60 mL/min signify possible Chronic Kidney Disease.    Anion gap 7 5 - 15    Comment: Performed at [facility], [place] [address], [place], [place] [address]   Dg Chest 2 View  Result Date: 01/27/2018 CLINICAL DATA:  Recent pneumonia and pleural effusions EXAM: CHEST - 2 VIEW COMPARISON:  January 14, 2018 FINDINGS: The heart size and mediastinal contours are stable. Left central venous line is unchanged. Bilateral pneumonias are identified, right worse than left. There is a right pleural effusion. There is interval developed moderate right pneumothorax. The visualized skeletal structures are stable. IMPRESSION: Moderate right pneumothorax noted. Bilateral pneumonias, right worse than left. Right pleural effusion. These results were called by telephone at the time of interpretation on 01/27/2018 at 2:46 pm to Elby Showers RN from Dr. Domenick Gong office, Who verbally acknowledged these results. Mr Ronnald Ramp stated she will call [phone] and give further direction to the patient. Electronically Signed   By: Abelardo Diesel M.D.   On: 01/27/2018 14:48   Dg Chest Portable 1 View  Result Date: 01/27/2018 CLINICAL DATA:  Initial evaluation for chest tube placement. EXAM: PORTABLE CHEST 1 VIEW COMPARISON:  Prior radiograph from earlier the same day. FINDINGS: Cardiomegaly, stable. Mediastinal silhouette within normal limits. Left-sided Port-A-Cath remains in place. Interval placement of pigtail right-sided chest tube with tip overlying the mid lower right lung. There is a trace apical pneumothorax at the right lung apex, pleural edge at the third intercostal space. Patchy bibasilar opacities, right greater than left again seen, stable from previous. No  appreciable pleural effusion. Underlying pulmonary vascular congestion without overt pulmonary edema. Osseous structures unchanged. IMPRESSION: 1. Interval placement of pigtail right-sided chest tube with tip overlying the mid right lower lobe. Tiny right apical pneumothorax as above. 2. Otherwise no significant interval change in multifocal bilateral airspace opacities, right greater than left. Electronically Signed   By: Jeannine Boga M.D.   On: 01/27/2018 20:57   None  Pending Labs Unresulted Labs (From admission, onward)    Start     Ordered   01/27/18 2011  Stat Gram stain  STAT,   STAT     01/27/18 2011   01/27/18 2011  Lactate dehydrogenase (pleural or peritoneal fluid)  Once,   R     01/27/18 2011   01/27/18 2010  Body fluid cell count with differential  Once,   R    Comments:  Please send a hole at [facility] cytology cell count culture Gram stain differential.   Question:  Are there also cytology or pathology orders on this specimen?  Answer:  Yes   01/27/18 2011          Vitals/Pain Today's Vitals   01/27/18 2010 01/27/18 2018 01/27/18 2030 01/27/18 2100  BP: (!) 129/100 (!) 144/75 110/87 (!) 99/59  Pulse: (!) 117 (!) 125 (!) 113 (!) 105  Resp: (!) 28 (!) 28 (!) 23 (!) 44  Temp:      TempSrc:      SpO2: 96% 95% 97% 97%  Weight:      Height:      PainSc:        Isolation Precautions No active isolations  Medications Medications  ceFEPIme (MAXIPIME) 1 g in sodium chloride 0.9 % 100 mL IVPB (has no administration in time range)  vancomycin (VANCOCIN) 1,500 mg in sodium chloride 0.9 % 500 mL IVPB (has no administration in time range)  lidocaine (PF) (XYLOCAINE) 1 % injection 5 mL (5 mLs Other Given by Other 01/27/18 2022)  lidocaine-EPINEPHrine (XYLOCAINE W/EPI) 2 %-1:200000 (PF) injection 20 mL (20 mLs Intradermal Given by Other 01/27/18 1814)  ketamine 50 mg in normal saline 5 mL (10 mg/mL) syringe (76 mg Intravenous Given 01/27/18 1810)  ketamine (KETALAR)  injection (20 mg Intravenous Given 01/27/18 1950)  LORazepam (ATIVAN) injection 2 mg (2 mg Intravenous Given 01/27/18 2030)  morphine 4 MG/ML injection 4 mg (4 mg Intravenous Given 01/27/18 2058)    Mobility walks with person assist

## 2018-01-27 NOTE — Sedation Documentation (Signed)
Vital signs stable. 

## 2018-01-27 NOTE — Sedation Documentation (Signed)
The intensivist has just seen her and has placed order for portable cxr. Pt. Continues to cough loudly and does not answer questions as of yet. She moves all extremities with ease and turns from side to side in her bed.

## 2018-01-27 NOTE — Sedation Documentation (Signed)
Per order of the intensivist, we give IV Ativan at this time for anxiety. As I write this, x-ray is preparing to perform cxr.

## 2018-01-27 NOTE — ED Triage Notes (Signed)
Pt reports had chest xray while ago, was told to get to Ed due to pneumothorax and some PNA.

## 2018-01-28 ENCOUNTER — Inpatient Hospital Stay (HOSPITAL_COMMUNITY): Payer: BLUE CROSS/BLUE SHIELD

## 2018-01-28 ENCOUNTER — Ambulatory Visit: Payer: BLUE CROSS/BLUE SHIELD

## 2018-01-28 ENCOUNTER — Telehealth: Payer: Self-pay

## 2018-01-28 DIAGNOSIS — E109 Type 1 diabetes mellitus without complications: Secondary | ICD-10-CM

## 2018-01-28 DIAGNOSIS — T451X5A Adverse effect of antineoplastic and immunosuppressive drugs, initial encounter: Secondary | ICD-10-CM

## 2018-01-28 DIAGNOSIS — I509 Heart failure, unspecified: Secondary | ICD-10-CM

## 2018-01-28 DIAGNOSIS — I340 Nonrheumatic mitral (valve) insufficiency: Secondary | ICD-10-CM

## 2018-01-28 DIAGNOSIS — J939 Pneumothorax, unspecified: Secondary | ICD-10-CM

## 2018-01-28 DIAGNOSIS — Z170 Estrogen receptor positive status [ER+]: Secondary | ICD-10-CM

## 2018-01-28 DIAGNOSIS — D6481 Anemia due to antineoplastic chemotherapy: Secondary | ICD-10-CM

## 2018-01-28 DIAGNOSIS — C50611 Malignant neoplasm of axillary tail of right female breast: Secondary | ICD-10-CM

## 2018-01-28 DIAGNOSIS — J9601 Acute respiratory failure with hypoxia: Secondary | ICD-10-CM

## 2018-01-28 LAB — CBC WITH DIFFERENTIAL/PLATELET
Abs Immature Granulocytes: 0.02 10*3/uL (ref 0.00–0.07)
BASOS PCT: 0 %
Basophils Absolute: 0 10*3/uL (ref 0.0–0.1)
Eosinophils Absolute: 0 10*3/uL (ref 0.0–0.5)
Eosinophils Relative: 0 %
HCT: 34.9 % — ABNORMAL LOW (ref 36.0–46.0)
Hemoglobin: 10.3 g/dL — ABNORMAL LOW (ref 12.0–15.0)
Immature Granulocytes: 0 %
Lymphocytes Relative: 4 %
Lymphs Abs: 0.4 10*3/uL — ABNORMAL LOW (ref 0.7–4.0)
MCH: 26.3 pg (ref 26.0–34.0)
MCHC: 29.5 g/dL — ABNORMAL LOW (ref 30.0–36.0)
MCV: 89.3 fL (ref 80.0–100.0)
MONO ABS: 0.4 10*3/uL (ref 0.1–1.0)
MONOS PCT: 5 %
NEUTROS ABS: 8.7 10*3/uL — AB (ref 1.7–7.7)
NEUTROS PCT: 91 %
PLATELETS: 358 10*3/uL (ref 150–400)
RBC: 3.91 MIL/uL (ref 3.87–5.11)
RDW: 17.4 % — ABNORMAL HIGH (ref 11.5–15.5)
WBC: 9.6 10*3/uL (ref 4.0–10.5)
nRBC: 0 % (ref 0.0–0.2)

## 2018-01-28 LAB — BODY FLUID CELL COUNT WITH DIFFERENTIAL
Lymphs, Fluid: 13 %
Monocyte-Macrophage-Serous Fluid: 12 % — ABNORMAL LOW (ref 50–90)
Neutrophil Count, Fluid: 75 % — ABNORMAL HIGH (ref 0–25)
WBC FLUID: 1607 uL — AB (ref 0–1000)

## 2018-01-28 LAB — ECHOCARDIOGRAM COMPLETE
Height: 62 in
WEIGHTICAEL: 2649.05 [oz_av]

## 2018-01-28 LAB — COMPREHENSIVE METABOLIC PANEL
ALT: 19 U/L (ref 0–44)
AST: 19 U/L (ref 15–41)
Albumin: 2.7 g/dL — ABNORMAL LOW (ref 3.5–5.0)
Alkaline Phosphatase: 56 U/L (ref 38–126)
Anion gap: 8 (ref 5–15)
BUN: 11 mg/dL (ref 6–20)
CHLORIDE: 112 mmol/L — AB (ref 98–111)
CO2: 22 mmol/L (ref 22–32)
CREATININE: 0.73 mg/dL (ref 0.44–1.00)
Calcium: 7.9 mg/dL — ABNORMAL LOW (ref 8.9–10.3)
Glucose, Bld: 157 mg/dL — ABNORMAL HIGH (ref 70–99)
POTASSIUM: 4.7 mmol/L (ref 3.5–5.1)
SODIUM: 142 mmol/L (ref 135–145)
Total Bilirubin: 1.3 mg/dL — ABNORMAL HIGH (ref 0.3–1.2)
Total Protein: 5.5 g/dL — ABNORMAL LOW (ref 6.5–8.1)

## 2018-01-28 LAB — LACTATE DEHYDROGENASE, PLEURAL OR PERITONEAL FLUID: LD FL: 164 U/L — AB (ref 3–23)

## 2018-01-28 LAB — GLUCOSE, CAPILLARY
GLUCOSE-CAPILLARY: 144 mg/dL — AB (ref 70–99)
GLUCOSE-CAPILLARY: 153 mg/dL — AB (ref 70–99)
GLUCOSE-CAPILLARY: 137 mg/dL — AB (ref 70–99)
GLUCOSE-CAPILLARY: 200 mg/dL — AB (ref 70–99)
Glucose-Capillary: 126 mg/dL — ABNORMAL HIGH (ref 70–99)

## 2018-01-28 LAB — LACTATE DEHYDROGENASE: LDH: 200 U/L — AB (ref 98–192)

## 2018-01-28 LAB — BRAIN NATRIURETIC PEPTIDE: B NATRIURETIC PEPTIDE 5: 738.3 pg/mL — AB (ref 0.0–100.0)

## 2018-01-28 LAB — PROTEIN, PLEURAL OR PERITONEAL FLUID

## 2018-01-28 LAB — LACTIC ACID, PLASMA: LACTIC ACID, VENOUS: 0.9 mmol/L (ref 0.5–1.9)

## 2018-01-28 LAB — PROCALCITONIN

## 2018-01-28 MED ORDER — VENLAFAXINE HCL ER 75 MG PO CP24
75.0000 mg | ORAL_CAPSULE | Freq: Every day | ORAL | Status: DC
  Administered 2018-01-28 – 2018-02-01 (×5): 75 mg via ORAL
  Filled 2018-01-28 (×5): qty 1

## 2018-01-28 MED ORDER — CHLORHEXIDINE GLUCONATE CLOTH 2 % EX PADS
6.0000 | MEDICATED_PAD | Freq: Every day | CUTANEOUS | Status: DC
  Administered 2018-01-28 – 2018-01-30 (×3): 6 via TOPICAL

## 2018-01-28 MED ORDER — FUROSEMIDE 10 MG/ML IJ SOLN
40.0000 mg | Freq: Two times a day (BID) | INTRAMUSCULAR | Status: DC
  Administered 2018-01-28 – 2018-01-31 (×6): 40 mg via INTRAVENOUS
  Filled 2018-01-28 (×6): qty 4

## 2018-01-28 MED ORDER — POTASSIUM CHLORIDE CRYS ER 20 MEQ PO TBCR
20.0000 meq | EXTENDED_RELEASE_TABLET | Freq: Two times a day (BID) | ORAL | Status: DC
  Administered 2018-01-28 – 2018-02-01 (×9): 20 meq via ORAL
  Filled 2018-01-28 (×9): qty 1

## 2018-01-28 MED ORDER — MORPHINE SULFATE (PF) 4 MG/ML IV SOLN
4.0000 mg | INTRAVENOUS | Status: DC | PRN
  Administered 2018-01-28 – 2018-01-29 (×4): 4 mg via INTRAVENOUS
  Filled 2018-01-28 (×5): qty 1

## 2018-01-28 MED ORDER — SODIUM CHLORIDE 0.9 % IV SOLN
2.0000 g | Freq: Three times a day (TID) | INTRAVENOUS | Status: DC
  Administered 2018-01-28 – 2018-01-29 (×3): 2 g via INTRAVENOUS
  Filled 2018-01-28 (×5): qty 2

## 2018-01-28 MED ORDER — PERFLUTREN LIPID MICROSPHERE
1.0000 mL | INTRAVENOUS | Status: AC | PRN
  Administered 2018-01-28: 2 mL via INTRAVENOUS
  Filled 2018-01-28: qty 10

## 2018-01-28 MED ORDER — OXYCODONE-ACETAMINOPHEN 5-325 MG PO TABS
1.0000 | ORAL_TABLET | ORAL | Status: DC | PRN
  Administered 2018-01-28 – 2018-02-01 (×7): 1 via ORAL
  Filled 2018-01-28 (×7): qty 1

## 2018-01-28 MED ORDER — SODIUM CHLORIDE 0.9 % IV SOLN
500.0000 mg | INTRAVENOUS | Status: DC
  Administered 2018-01-28 – 2018-01-29 (×2): 500 mg via INTRAVENOUS
  Filled 2018-01-28 (×2): qty 500

## 2018-01-28 NOTE — Progress Notes (Signed)
Pharmacy Antibiotic Note  Stephanie Holden is a 42 y.o. female admitted on 01/27/2018 with SOB, respiratory failure, pleural effusion, suspected PNA.  Pharmacy has been consulted for Cefepime dosing.  Plan: Cefepime 2g IV q8h Follow up renal function, culture results, and clinical course.   Height: 5\' 2"  (157.5 cm) Weight: 165 lb 9.1 oz (75.1 kg) IBW/kg (Calculated) : 50.1  Temp (24hrs), Avg:98.3 F (36.8 C), Min:98.1 F (36.7 C), Max:98.5 F (36.9 C)  Recent Labs  Lab 01/27/18 1619 01/27/18 2310 01/28/18 0205  WBC 6.7  --  9.6  CREATININE 0.85  --  0.73  LATICACIDVEN  --  1.0 0.9    Estimated Creatinine Clearance: 87.8 mL/min (by C-G formula based on SCr of 0.73 mg/dL).    Allergies  Allergen Reactions  . Dilaudid [Hydromorphone Hcl] Itching and Other (See Comments)    UNCONTROLLABLE CRYING    Antimicrobials this admission:  11/5 Vancomycin  x1 only  11/5 Cefepime  >>   Dose adjustments this admission:    Microbiology results:  Pleural fluid, R:  11/5 MRSA PCR: negative  Thank you for allowing pharmacy to be a part of this patient's care.  Gretta Arab PharmD, BCPS Pager [phone] 01/28/2018 8:51 AM

## 2018-01-28 NOTE — Care Management Note (Signed)
Case Management Note  Patient Details  Name: Stephanie Holden MRN: [id number] Date of Birth: 06-15-1975  Subjective/Objective:                  Spontaneous pneumothorax has rt side chest tube in place  Action/Plan: Will follow for progression of care and clinical status. Will follow for case management needs none present at this time.  Expected Discharge Date:  (unknown)               Expected Discharge Plan:  Home/Self Care  In-House Referral:     Discharge planning Services  CM Consult  Post Acute Care Choice:    Choice offered to:     DME Arranged:    DME Agency:     HH Arranged:    HH Agency:     Status of Service:  In process, will continue to follow  If discussed at Long Length of Stay Meetings, dates discussed:    Additional Comments:  Leeroy Cha, RN 01/28/2018, 8:31 AM

## 2018-01-28 NOTE — Progress Notes (Signed)
  Echocardiogram 2D Echocardiogram has been performed.  Bobbye Charleston 01/28/2018, 2:00 PM

## 2018-01-28 NOTE — Progress Notes (Signed)
  HEMATOLOGY-ONCOLOGY PROGRESS NOTE  SUBJECTIVE:Patient is admitted with worsening shortness of breath.  Chest tube was placed because of clinical diagnosis of pneumothorax.  She continues to have bilateral lung infiltrates and pleural effusion  OBJECTIVE: REVIEW OF SYSTEMS:   Not obtained because patient is undergoing echocardiogram PHYSICAL EXAMINATION: ECOG PERFORMANCE STATUS: 3 - Symptomatic, >50% confined to bed  Vitals:   01/28/18 1100 01/28/18 1200  BP: 128/82 119/77  Pulse: 100 99  Resp: (!) 30 (!) 21  Temp:  97.6 F (36.4 C)  SpO2: 94% 97%   Filed Weights   01/27/18 1507 01/28/18 0500  Weight: 168 lb (76.2 kg) 165 lb 9.1 oz (75.1 kg)   Chest tube in place  LABORATORY DATA:  I have reviewed the data as listed CMP Latest Ref Rng & Units 01/28/2018 01/27/2018 01/16/2018  Glucose 70 - 99 mg/dL 157(H) 94 211(H)  BUN 6 - 20 mg/dL 11 12 10   Creatinine 0.44 - 1.00 mg/dL 0.73 0.85 0.70  Sodium 135 - 145 mmol/L 142 140 140  Potassium 3.5 - 5.1 mmol/L 4.7 3.8 4.1  Chloride 98 - 111 mmol/L 112(H) 106 109  CO2 22 - 32 mmol/L 22 27 22   Calcium 8.9 - 10.3 mg/dL 7.9(L) 8.8(L) 8.5(L)  Total Protein 6.5 - 8.1 g/dL 5.5(L) 6.5 -  Total Bilirubin 0.3 - 1.2 mg/dL 1.3(H) 0.8 -  Alkaline Phos 38 - 126 U/L 56 65 -  AST 15 - 41 U/L 19 23 -  ALT 0 - 44 U/L 19 21 -    Lab Results  Component Value Date   WBC 9.6 01/28/2018   HGB 10.3 (L) 01/28/2018   HCT 34.9 (L) 01/28/2018   MCV 89.3 01/28/2018   PLT 358 01/28/2018   NEUTROABS 8.7 (H) 01/28/2018    ASSESSMENT AND PLAN: 1.  Acute shortness of breath: I agree with the differential diagnosis of CHF versus pneumonia versus malignancy. I do not believe it is malignancy based on previous negative cytology.  Current cytology will be pending. It is very likely to be Adriamycin related congestive heart failure since she did not respond to antibiotic therapy from previous hospitalization. She might need cardiology consultation to assist  with her management. Echocardiogram is currently being done.  2. I discussed with Dr. Isidore Moos and radiation will be on hold today.

## 2018-01-28 NOTE — Telephone Encounter (Signed)
[name] was admitted to the hospital last night with a pneumothorax. She currently has a right chest tube. I spoke with Dr. Isidore Moos about [name] getting her radiation treatment today. Dr. Isidore Moos would like to treat her if possible, today. I called to talk to [name]'s nurse Hailey in ICU and asked if she would be able to come this afternoon after her pain is better controlled. Hailey informed me that [name]'s husband declined her radiation for today. I will inform Dr. Isidore Moos and LINAC 3. I will recheck on [name] tomorrow.

## 2018-01-28 NOTE — Progress Notes (Signed)
PROGRESS NOTE    Stephanie Holden  NAT:[id number] DOB: 1976/01/20 DOA: 01/27/2018 PCP: Haywood Pao, MD      Brief Narrative:  Stephanie Holden is a 42 y.o. F with hx BrCA s/p bilateral mastect, chemo with adriamycin and now on radiation therapy, recent admission for pleural effusion and also IDDM on insulin pump who presents with dyspnea.  In ER, CXR initially interpreted by Radiology as pneumothorax.  Chest tube placed by EDP, but no air return, only fluid.  Subsequent overread by Pulmonology confirms patient had pleural effusion only, similar to recent.  Admitted for further management of pleural effusion.      Assessment & Plan:  Acute hypoxic respiratory failure Initially thought to be from pneumothorax.  This was incorrect, the patient had a pleural effusion.  She also has bilateral opacities, which have the appearance of edema or atypical pneumonia on imaging.  DDx includes pneumonia with parapneumonic effusion, radiation pneumonitis, metastatic cancer/lymphangitic spread, or most likely CHF in setting of adriamycin use.   Pleural effusion Pleural fluid studies neglected during last hospitalization except cytology (which was negative) and fluid protein (which was undetectable).  No fluid sent overnight because initial expectation was that it was pneumothorax. Current water seal will severely limit utility of fluid studies now.  Ddx includes infection/parapneumonic, malignant effusion, or CHF. -Send fluid for LDH, protein, cytology and culture --> this was personally coordinated with labs -Consult Pulmonlogy for chest tube management, I would favor removal asap  Possible pneumonia with risk factors for MDR (recent chemo, on radiation, cancer, recent hospitalizations and recent ABx use) MRSA nares negative -Continue cefepime -Stop Flagyl, start azithromycin  Acute decompensated heart failure Reports new leg swelling, orthopnea.  BNP elevated.  Hx adriamycin. -Repeat echo -Start  Lasix 40 BID -STart K suppl -Daily BMP -Daily weights, strict I/O  Diabetes Glucose normal overnight -Continue insulin pump per home routine -CBGs TIDACHS  Other medications -Continue venlafaxine -Hold Breo  CHemotherapy associated anemia Stable Hgb relative to baseline        MDM and disposition: The below labs and imaging reports were reviewed and summarized above.  Medication management as above.  The patient was admitted with pleural effusion acute hypoxic respiratory failure.  She remains very dyspneic, and severe pain requiring frequent dosing of IV opiates, and with new acute decompensated heart failure.  This is a severe exacerbation of her chronic illness.      DVT prophylaxis: SCDs Code Status: FULL Family Communication: Stephanie Holden at bedside    Consultants:   Pulmonology  Procedures:   Chest tube placement 11/5  Antimicrobials:   Azithromycin 11/5 >>  Cefepime 11/5 >>  Flagyl 11/5 x1     Subjective: Feeling a lot of left chest pain, worse with coughing.  Orthopnea noted, complains of leg swelling.  No fever, sputum, hemoptysis.  No confusion.  Objective: Vitals:   01/28/18 0500 01/28/18 0600 01/28/18 0700 01/28/18 0800  BP: 123/72 (!) 141/76 119/80   Pulse: 94 93 (!) 102   Resp: (!) 27 (!) 26 (!) 31   Temp:    98.1 F (36.7 C)  TempSrc:    Oral  SpO2: 93% 95% 97%   Weight: 75.1 kg     Height:        Intake/Output Summary (Last 24 hours) at 01/28/2018 0924 Last data filed at 01/28/2018 0131 Gross per 24 hour  Intake 599.93 ml  Output -  Net 599.93 ml   Filed Weights   01/27/18 1507 01/28/18 0500  Weight: 76.2 kg 75.1 kg    Examination: General appearance:  adult female, alert and in moderate distress from pain, dyspnea.    HEENT: Anicteric, conjunctiva pink, lids and lashes normal. No nasal deformity, discharge, epistaxis.  Lips moist, dentition good, OP moist, no oral lesions, hearing normal.   Skin: Warm and dry.  Pale,  no jaundice.  No suspicious rashes or lesions. Cardiac: RRR, nl S1-S2, no murmurs appreciated.  Capillary refill is brisk.  JVP not visible.  1+ nonpitting LE edema.  Radia  pulses 2+ and symmetric. Respiratory: Tachypneic, shallow.  Rales at bases.  No wheezing. Abdomen: Abdomen soft.  No TTP. No ascites, distension, hepatosplenomegaly.   MSK: No deformities or effusions of large joints bilaterally, muscle bulk and tone normal. Neuro: Awake and alert.  EOMI, moves all extremities. Speech fluent.    Psych: Sensorium intact and responding to questions, attention normal. Affect blunted by pain.  Judgment and insight appear normal.    Data Reviewed: I have personally reviewed following labs and imaging studies:  CBC: Recent Labs  Lab 01/27/18 1619 01/28/18 0205  WBC 6.7 9.6  NEUTROABS 5.1 8.7*  HGB 11.3* 10.3*  HCT 37.4 34.9*  MCV 86.4 89.3  PLT 424* 485   Basic Metabolic Panel: Recent Labs  Lab 01/27/18 1619 01/28/18 0205  NA 140 142  K 3.8 4.7  CL 106 112*  CO2 27 22  GLUCOSE 94 157*  BUN 12 11  CREATININE 0.85 0.73  CALCIUM 8.8* 7.9*   GFR: Estimated Creatinine Clearance: 87.8 mL/min (by C-G formula based on SCr of 0.73 mg/dL). Liver Function Tests: Recent Labs  Lab 01/27/18 1619 01/28/18 0205  AST 23 19  ALT 21 19  ALKPHOS 65 56  BILITOT 0.8 1.3*  PROT 6.5 5.5*  ALBUMIN 3.3* 2.7*   No results for input(s): LIPASE, AMYLASE in the last 168 hours. No results for input(s): AMMONIA in the last 168 hours. Coagulation Profile: Recent Labs  Lab 01/27/18 2310  INR 1.11   Cardiac Enzymes: No results for input(s): CKTOTAL, CKMB, CKMBINDEX, TROPONINI in the last 168 hours. BNP (last 3 results) No results for input(s): PROBNP in the last 8760 hours. HbA1C: No results for input(s): HGBA1C in the last 72 hours. CBG: Recent Labs  Lab 01/27/18 2309 01/28/18 0318 01/28/18 0726  GLUCAP 121* 144* 153*   Lipid Profile: No results for input(s): CHOL, HDL,  LDLCALC, TRIG, CHOLHDL, LDLDIRECT in the last 72 hours. Thyroid Function Tests: No results for input(s): TSH, T4TOTAL, FREET4, T3FREE, THYROIDAB in the last 72 hours. Anemia Panel: No results for input(s): VITAMINB12, FOLATE, FERRITIN, TIBC, IRON, RETICCTPCT in the last 72 hours. Urine analysis:    Component Value Date/Time   COLORURINE YELLOW 12/02/2017 1030   APPEARANCEUR HAZY (A) 12/02/2017 1030   LABSPEC 1.023 12/02/2017 1030   PHURINE 5.0 12/02/2017 1030   GLUCOSEU >=500 (A) 12/02/2017 1030   HGBUR MODERATE (A) 12/02/2017 1030   BILIRUBINUR NEGATIVE 12/02/2017 1030   BILIRUBINUR n 09/06/2014 0845   KETONESUR NEGATIVE 12/02/2017 1030   PROTEINUR NEGATIVE 12/02/2017 1030   UROBILINOGEN negative 09/06/2014 0845   NITRITE NEGATIVE 12/02/2017 1030   LEUKOCYTESUR MODERATE (A) 12/02/2017 1030   Sepsis Labs: _0 (procalcitonin:4,lacticacidven:4)  ) Recent Results (from the past 240 hour(s))  MRSA PCR Screening     Status: None   Collection Time: 01/27/18 10:13 PM  Result Value Ref Range Status   MRSA by PCR NEGATIVE NEGATIVE Final    Comment:  The GeneXpert MRSA Assay (FDA approved for NASAL specimens only), is one component of a comprehensive MRSA colonization surveillance program. It is not intended to diagnose MRSA infection nor to guide or monitor treatment for MRSA infections. Performed at [facility], [place] [address], [place], [place] [address]          Radiology Studies: Dg Chest 2 View  Result Date: 01/27/2018 CLINICAL DATA:  Recent pneumonia and pleural effusions EXAM: CHEST - 2 VIEW COMPARISON:  January 14, 2018 FINDINGS: The heart size and mediastinal contours are stable. Left central venous line is unchanged. Bilateral pneumonias are identified, right worse than left. There is a right pleural effusion. There is interval developed moderate right pneumothorax. The visualized skeletal structures are stable. IMPRESSION: Moderate  right pneumothorax noted. Bilateral pneumonias, right worse than left. Right pleural effusion. These results were called by telephone at the time of interpretation on 01/27/2018 at 2:46 pm to Elby Showers RN from Dr. Domenick Gong office, Who verbally acknowledged these results. Mr Ronnald Ramp stated she will call [facility] and give further direction to the patient. Electronically Signed   By: Abelardo Diesel M.D.   On: 01/27/2018 14:48   Dg Chest Portable 1 View  Result Date: 01/27/2018 CLINICAL DATA:  Initial evaluation for chest tube placement. EXAM: PORTABLE CHEST 1 VIEW COMPARISON:  Prior radiograph from earlier the same day. FINDINGS: Cardiomegaly, stable. Mediastinal silhouette within normal limits. Left-sided Port-A-Cath remains in place. Interval placement of pigtail right-sided chest tube with tip overlying the mid lower right lung. There is a trace apical pneumothorax at the right lung apex, pleural edge at the third intercostal space. Patchy bibasilar opacities, right greater than left again seen, stable from previous. No appreciable pleural effusion. Underlying pulmonary vascular congestion without overt pulmonary edema. Osseous structures unchanged. IMPRESSION: 1. Interval placement of pigtail right-sided chest tube with tip overlying the mid right lower lobe. Tiny right apical pneumothorax as above. 2. Otherwise no significant interval change in multifocal bilateral airspace opacities, right greater than left. Electronically Signed   By: Jeannine Boga M.D.   On: 01/27/2018 20:57        Scheduled Meds: . Chlorhexidine Gluconate Cloth  6 each Topical Daily  . furosemide  40 mg Intravenous BID  . venlafaxine XR  75 mg Oral Q breakfast   Continuous Infusions: . azithromycin Stopped (01/28/18 0710)  . ceFEPime (MAXIPIME) IV 2 g (01/28/18 0846)     LOS: 1 day    Time spent: 35 minutes    Edwin Dada, MD [facility] 01/28/2018, 9:24 AM      Pager [phone] --- please page though AMION:  [email or web address] If 7PM-7AM, please contact night-coverage

## 2018-01-28 NOTE — Consult Note (Signed)
NAME:  Stephanie Holden, MRN:  [id number], DOB:  1975/08/27, LOS: 1 ADMISSION DATE:  01/27/2018, CONSULTATION DATE: 01/28/2018 REFERRING MD:  Reece Levy, CHIEF COMPLAINT: Pleural effusion/chest tube management  Brief History   Stephanie Holden is a 42 year old female with history of breast cancer status post bilateral mastectomy s/p chemotherapy and currently receiving radiation who presented with dyspnea and found with pleural effusion for which chest tube was placed.  Pulmonary was consulted for recommendations and management of chest tube.  Past Medical History  Breast cancer, type 1 diabetes Significant Hospital Events   11/5>>> chest tube placement with 1300 cc drained  Consults: date of consult/date signed off & final recs:  11/5 pulmonary consulted  Procedures (surgical and bedside):  None  Significant Diagnostic Tests:  Pleural studies per EMR  Micro Data:  Pleural culture 11/6>>>p ending  Antimicrobials:  Cefepime 11/5>>> Theramycin 11/5>>> Subjective:  She reports that her shortness of breath remains unchanged.  She continues to have pleuritic chest pain/shoulder pain though it is improved since the pleural fluid has drained.  Denies fevers, chills  Objective   Blood pressure (!) 101/58, pulse 94, temperature 98.4 F (36.9 C), temperature source Oral, resp. rate (!) 21, height 5\' 2"  (1.575 m), weight 75.1 kg, SpO2 95 %.        Intake/Output Summary (Last 24 hours) at 01/28/2018 1816 Last data filed at 01/28/2018 1630 Gross per 24 hour  Intake 949.93 ml  Output 3225 ml  Net -2275.07 ml   Filed Weights   01/27/18 1507 01/28/18 0500  Weight: 76.2 kg 75.1 kg    Physical Exam: General: Well-appearing, no acute distress HENT: [place], AT, OP clear, MMM  Eyes: EOMI, no scleral icterus Respiratory: Clear to auscultation bilaterally.  No crackles, wheezing or rales, right chest tube draining straw-colored pleural fluid, no air bubbles present Cardiovascular: RRR, -M/R/G,  no JVD GI: BS+, soft, nontender Extremities: + Nonpitting edema in lower extremities bilaterally,-tenderness Neuro: AAO x4, CNII-XII grossly intact Skin: L PAC in place, no rashes or bruising Psych: Normal mood, normal affect   Resolved Hospital Problem list     Assessment & Plan:  42 year old female with breast cancer status post chemotherapy on radiation with recent history of pleural effusion associated with H CAP now with recurrent pleural effusion.  Consulted for recommendations on chest tube management  #Recurrent right pleural effusion Concern for malignant effusion in patient with history of cancer however cytology from  last thoracentesis on 10/22 showed reactive mesothelial cells.  Reviewed pleural labs on this admission-exudative effusion, predominantly neutrophilic, cultures and cytology pending.  Possibly be related to infection, recommend completing antibiotic course and chest tube removal when able.  If effusion recurs, can consider Pleurx catheter placement versus pleurodesis pending etiology of effusion. Plan -Monitor chest tube output.   When output <100cc/24h, place chest tube on waterseal and repeat CXR to monitor for air/fluid reaccumulation. AVOID clamping chest tube in setting of suspected pneumothorax  If clinically stable and output remains <100cc/24 hours, chest tube can be removed.  -Continue empiric antibiotics -Follow-up final culture and cytology  #Chronic anemia -Serial CBC  Disposition / Summary of Today's Plan 01/28/18   See above plan Patient transferred to the floor.  Discussed plan with hospitalist.  Pulmonary will sign off.  Please call or page for any questions or concerns.   Diet: Low/Medium Carb DVT prophylaxis: SCDs GI prophylaxis: Not indicated Code Status: Full Family Communication: Patient and patient's father  Labs   CBC: Recent Labs  Lab 01/27/18 1619 01/28/18 0205  WBC 6.7 9.6  NEUTROABS 5.1 8.7*  HGB 11.3* 10.3*  HCT 37.4  34.9*  MCV 86.4 89.3  PLT 424* 161    Basic Metabolic Panel: Recent Labs  Lab 01/27/18 1619 01/28/18 0205  NA 140 142  K 3.8 4.7  CL 106 112*  CO2 27 22  GLUCOSE 94 157*  BUN 12 11  CREATININE 0.85 0.73  CALCIUM 8.8* 7.9*   GFR: Estimated Creatinine Clearance: 87.8 mL/min (by C-G formula based on SCr of 0.73 mg/dL). Recent Labs  Lab 01/27/18 1619 01/27/18 2310 01/28/18 0205  PROCALCITON  --  <0.10  --   WBC 6.7  --  9.6  LATICACIDVEN  --  1.0 0.9    Liver Function Tests: Recent Labs  Lab 01/27/18 1619 01/28/18 0205  AST 23 19  ALT 21 19  ALKPHOS 65 56  BILITOT 0.8 1.3*  PROT 6.5 5.5*  ALBUMIN 3.3* 2.7*   No results for input(s): LIPASE, AMYLASE in the last 168 hours. No results for input(s): AMMONIA in the last 168 hours.  ABG No results found for: PHART, PCO2ART, PO2ART, HCO3, TCO2, ACIDBASEDEF, O2SAT   Coagulation Profile: Recent Labs  Lab 01/27/18 2310  INR 1.11    Cardiac Enzymes: No results for input(s): CKTOTAL, CKMB, CKMBINDEX, TROPONINI in the last 168 hours.  HbA1C: Hgb A1c MFr Bld  Date/Time Value Ref Range Status  06/24/2017 01:37 PM 6.9 (H) 4.8 - 5.6 % Final    Comment:    (NOTE) Pre diabetes:          5.7%-6.4% Diabetes:              >6.4% Glycemic control for   <7.0% adults with diabetes     CBG: Recent Labs  Lab 01/27/18 2309 01/28/18 0318 01/28/18 0726 01/28/18 1217 01/28/18 1545  GLUCAP 121* 144* 153* 126* 137*   BNP 738   CXR 11/5 - Bilateral groundglass opacities, right pigtail catheter, improved/resolved right pleural effusion compared to prior xray earlier in the day, tiny right apical pneumothorax Admitting History of Present Illness.   Stephanie Holden is a 42 year old female with breast cancer bilateral mastectomy status post chemotherapy with Adriamycin and Cytoxan followed by Taxol currently receiving radiation therapy and recent hospital admission for pneumonia with pleural effusion status post  thoracentesis on 01/16/2018 who presents with shortness of breath.  Reports worsening progressive shortness of breath last 2 weeks associated with shoulder pain and pleuritic chest pain.  Activity worsened her symptoms.  Nothing improved her symptoms.  She contacted her PCP and had an outpatient chest x-ray which was concerning for right pneumothorax.  She was advised to present to the ED for further work-up.  In the ED, she was tachycardic and tachypneic. Per consult note chest tube was placed for large pleural effusion with immediate drainage of 1300 cc fluid but no evidence of air.  She was started on empiric antibiotics and admitted to the ICU.  Discharge summary 10/25 reviewed and summarized: Admitted from 10/20-25 for healthcare associated pneumonia.  Right pleural effusion was also seen during that time and she underwent thoracentesis.  Cultures were negative and cytology showed reactive mesothelial cells. Review of Systems:   Review of Systems  Constitutional: Positive for malaise/fatigue. Negative for chills, diaphoresis, fever and weight loss.  HENT: Negative for congestion and sore throat.   Eyes: Negative for blurred vision.  Respiratory: Positive for cough and shortness of breath. Negative for hemoptysis, sputum production and wheezing.  Cardiovascular: Positive for chest pain. Negative for palpitations, orthopnea, leg swelling and PND.  Gastrointestinal: Negative for abdominal pain and nausea.  Genitourinary: Positive for frequency.  Musculoskeletal: Positive for back pain.  Skin: Negative for rash.  Neurological: Negative for dizziness, weakness and headaches.  Endo/Heme/Allergies: Does not bruise/bleed easily.     Past Medical History  She,  has a past medical history of Cancer [facility]), Constipation, Depression, Genetic testing (05/27/2017), GERD (gastroesophageal reflux disease), Insulin dependent type 1 diabetes mellitus ([place]), Insulin pump in place, PONV (postoperative nausea  and vomiting), Stenosing tenosynovitis of thumb (06/2014), and Thyroid goiter.   Surgical History    Past Surgical History:  Procedure Laterality Date  . AXILLARY LYMPH NODE DISSECTION Right 06/26/2017   Procedure: AXILLARY LYMPH NODE DISSECTION;  Surgeon: [name], MD;  Location: [place];  Service: General;  Laterality: Right;  . CARPAL TUNNEL RELEASE Bilateral 10/28/2013   Procedure: BILATERAL CARPAL TUNNEL RELEASE;  Surgeon: Daryll Brod, MD;  Location: [place];  Service: Orthopedics;  Laterality: Bilateral;  . CESAREAN SECTION  2000; 08/20/2001  . ESSURE TUBAL LIGATION  2005   failed, tubal puncture  . LAPAROSCOPIC TUBAL LIGATION  04/02/2007   removal Ess[place]  . LAPAROSCOPIC UNILATERAL SALPINGECTOMY  2005  . MASTECTOMY W/ SENTINEL NODE BIOPSY Bilateral 06/05/2017   Procedure: BILATERAL MASTECTOMIES  WITH RIGHT SENTINEL LYMPH NODE BIOPSY;  Surgeon: [name], MD;  Location: [place];  Service: General;  Laterality: Bilateral;  . PORTACATH PLACEMENT Left 06/26/2017   Procedure: INSERTION PORT-A-CATH;  Surgeon: [name], MD;  Location: [place];  Service: General;  Laterality: Left;  . TRIGGER FINGER RELEASE Left 04/12/2014   Procedure: RELEASE A-1 PULLEY LEFT THUMB;  Surgeon: Daryll Brod, MD;  Location: [place];  Service: Orthopedics;  Laterality: Left;  . TRIGGER FINGER RELEASE Right 07/19/2014   Procedure: RELEASE TRIGGER FINGER/A-1 PULLEY RIGHT THUMB;  Surgeon: Daryll Brod, MD;  Location: [place];  Service: Orthopedics;  Laterality: Right;     Social History   Social History   Socioeconomic History  . Marital status: Married    Spouse name: Not on file  . Number of children: Not on file  . Years of education: Not on file  . Highest education level: Not on file  Occupational History  . Not on file  Social Needs  . Financial resource strain: Not on file  . Food insecurity:    Worry: Not on file    Inability: Not on file  .  Transportation needs:    Medical: No    Non-medical: No  Tobacco Use  . Smoking status: Former Smoker    Last attempt to quit: 03/24/2001    Years since quitting: 16.8  . Smokeless tobacco: Never Used  Substance and Sexual Activity  . Alcohol use: Not Currently  . Drug use: No  . Sexual activity: Yes    Partners: Male    Birth control/protection: Surgical    Comment: tubal ligation & 1 tube removed  Lifestyle  . Physical activity:    Days per week: Not on file    Minutes per session: Not on file  . Stress: Not on file  Relationships  . Social connections:    Talks on phone: Not on file    Gets together: Not on file    Attends religious service: Not on file    Active member of club or organization: Not on file    Attends meetings of clubs or organizations:  Not on file    Relationship status: Not on file  . Intimate partner violence:    Fear of current or ex partner: No    Emotionally abused: No    Physically abused: No    Forced sexual activity: No  Other Topics Concern  . Not on file  Social History Narrative  . Not on file  ,  reports that she quit smoking about 16 years ago. She has never used smokeless tobacco. She reports that she drank alcohol. She reports that she does not use drugs.   Family History   Her family history includes Breast cancer (age of onset: 27) in her other; Hypertension in her father and mother.   Allergies Allergies  Allergen Reactions  . Dilaudid [Hydromorphone Hcl] Itching and Other (See Comments)    UNCONTROLLABLE CRYING     Home Medications  Prior to Admission medications   Medication Sig Start Date End Date Taking? Authorizing Provider  albuterol (PROVENTIL HFA;VENTOLIN HFA) 108 (90 Base) MCG/ACT inhaler Inhale 1-2 puffs into the lungs every 6 (six) hours as needed for wheezing or shortness of breath.   Yes [provider]  fluticasone furoate-vilanterol (BREO ELLIPTA) 200-25 MCG/INH AEPB Inhale 1 puff into the lungs daily.    Yes [provider]  guaiFENesin-dextromethorphan (ROBITUSSIN DM) 100-10 MG/5ML syrup Take 5 mLs by mouth every 4 (four) hours as needed for cough. 01/16/18  Yes Shelly Coss, MD  hyaluronate sodium (RADIAPLEXRX) GEL Apply 1 application topically once.   Yes [provider]  insulin aspart (NOVOLOG) 100 UNIT/ML injection Inject 45 Units into the skin See admin instructions. Use via insulin pump up to 45 units daily   Yes [provider]  LORazepam (ATIVAN) 0.5 MG tablet TAKE 1 TABLET BY MOUTH EVERY 6 HOURS AS NEEDED FOR ANXIETY OR SLEEP Patient taking differently: Take 0.5 mg by mouth every 6 (six) hours as needed for anxiety or sleep.  12/23/17  Yes Nicholas Lose, MD  meloxicam (MOBIC) 7.5 MG tablet Take 7.5 mg by mouth daily.   Yes [provider]  oxyCODONE-acetaminophen (PERCOCET/ROXICET) 5-325 MG tablet Take 1 tablet by mouth every 8 (eight) hours as needed for severe pain. 12/22/17  Yes Nicholas Lose, MD  Probiotic Product (PROBIOTIC DAILY PO) Take 1 capsule by mouth at bedtime.    Yes [provider]  venlafaxine XR (EFFEXOR-XR) 75 MG 24 hr capsule Take 1 capsule (75 mg total) by mouth daily with breakfast. Patient taking differently: Take 75 mg by mouth.  11/18/17  Yes Nicholas Lose, MD  lidocaine-prilocaine (EMLA) cream Apply to affected area once Patient not taking: Reported on 01/01/2018 06/20/17   Nicholas Lose, MD  ondansetron (ZOFRAN) 8 MG tablet Take 1 tablet (8 mg total) by mouth 2 (two) times daily as needed. Start on the third day after chemotherapy. Patient not taking: Reported on 01/01/2018 06/20/17   Nicholas Lose, MD

## 2018-01-29 ENCOUNTER — Ambulatory Visit
Admission: RE | Admit: 2018-01-29 | Discharge: 2018-01-29 | Disposition: A | Discharge status: Home / Self Care | Payer: BLUE CROSS/BLUE SHIELD | Source: Ambulatory Visit | Attending: Radiation Oncology | Admitting: Radiation Oncology

## 2018-01-29 ENCOUNTER — Inpatient Hospital Stay (HOSPITAL_COMMUNITY): Payer: BLUE CROSS/BLUE SHIELD

## 2018-01-29 DIAGNOSIS — I429 Cardiomyopathy, unspecified: Secondary | ICD-10-CM

## 2018-01-29 DIAGNOSIS — C50611 Malignant neoplasm of axillary tail of right female breast: Secondary | ICD-10-CM | POA: Diagnosis not present

## 2018-01-29 LAB — BASIC METABOLIC PANEL
Anion gap: 8 (ref 5–15)
BUN: 19 mg/dL (ref 6–20)
CO2: 22 mmol/L (ref 22–32)
Calcium: 8.2 mg/dL — ABNORMAL LOW (ref 8.9–10.3)
Chloride: 109 mmol/L (ref 98–111)
Creatinine, Ser: 0.94 mg/dL (ref 0.44–1.00)
GFR calc non Af Amer: >60 mL/min (ref >60)
GLUCOSE: 220 mg/dL — AB (ref 70–99)
POTASSIUM: 4.2 mmol/L (ref 3.5–5.1)
Sodium: 139 mmol/L (ref 135–145)

## 2018-01-29 LAB — CBC
HEMATOCRIT: 35.5 % — AB (ref 36.0–46.0)
HEMOGLOBIN: 10.4 g/dL — AB (ref 12.0–15.0)
MCH: 26.3 pg (ref 26.0–34.0)
MCHC: 29.3 g/dL — ABNORMAL LOW (ref 30.0–36.0)
MCV: 89.6 fL (ref 80.0–100.0)
Platelets: 336 10*3/uL (ref 150–400)
RBC: 3.96 MIL/uL (ref 3.87–5.11)
RDW: 17.5 % — ABNORMAL HIGH (ref 11.5–15.5)
WBC: 5.6 10*3/uL (ref 4.0–10.5)
nRBC: 0 % (ref 0.0–0.2)

## 2018-01-29 LAB — GLUCOSE, CAPILLARY
GLUCOSE-CAPILLARY: 257 mg/dL — AB (ref 70–99)
GLUCOSE-CAPILLARY: 134 mg/dL — AB (ref 70–99)
GLUCOSE-CAPILLARY: 179 mg/dL — AB (ref 70–99)
Glucose-Capillary: 137 mg/dL — ABNORMAL HIGH (ref 70–99)

## 2018-01-29 MED ORDER — SODIUM CHLORIDE 0.9% FLUSH
10.0000 mL | INTRAVENOUS | Status: DC | PRN
  Administered 2018-01-30: 20 mL
  Filled 2018-01-29: qty 40

## 2018-01-29 MED ORDER — ALTEPLASE 2 MG IJ SOLR
2.0000 mg | Freq: Once | INTRAMUSCULAR | Status: AC
  Administered 2018-01-29: 2 mg
  Filled 2018-01-29: qty 2

## 2018-01-29 MED ORDER — SPIRONOLACTONE 12.5 MG HALF TABLET
12.5000 mg | ORAL_TABLET | Freq: Every day | ORAL | Status: DC
  Administered 2018-01-29 – 2018-01-31 (×3): 12.5 mg via ORAL
  Filled 2018-01-29 (×3): qty 1

## 2018-01-29 MED ORDER — SODIUM CHLORIDE 0.9% FLUSH
10.0000 mL | Freq: Two times a day (BID) | INTRAVENOUS | Status: DC
  Administered 2018-01-30: 10 mL

## 2018-01-29 MED ORDER — LOSARTAN POTASSIUM 25 MG PO TABS
25.0000 mg | ORAL_TABLET | Freq: Every day | ORAL | Status: DC
  Administered 2018-01-29 – 2018-01-30 (×2): 25 mg via ORAL
  Filled 2018-01-29 (×2): qty 1

## 2018-01-29 NOTE — Progress Notes (Signed)
PROGRESS NOTE    Stephanie Holden  GBM:[id number] DOB: 1976/01/14 DOA: 01/27/2018 PCP: Haywood Pao, MD      Brief Narrative:  Stephanie Holden is a 42 y.o. F with hx BrCA s/p bilateral mastect, chemo with adriamycin and now on radiation therapy, recent admission for pleural effusion and also IDDM on insulin pump who presents with dyspnea.  In ER, CXR initially interpreted by Radiology as pneumothorax.  Chest tube placed by EDP, but no air return, only fluid.  Subsequent overread by Pulmonology confirms patient had pleural effusion only, similar to recent.  Admitted for further management of pleural effusion.      Assessment & Plan:  Acute hypoxic respiratory failure Initially thought to be from pneumothorax.  This was incorrect, the patient had a pleural effusion and pulmonary edema.   Acute systolic heart failure Reported new leg swelling, orthopnea.  BNP elevated.  Hx adriamycin.  Echo yesterday showed EF 25%.  Net negative 3.5L yesterday (about 1.5L per chest tube).  Cr stable, K good. -Continue Lasix BID -Continue K suppl -Cardiology have started spironolactone and low dose ARB -BB deferred in decomp CHF -Daily BMP -Daily weights, strict I/O   Pleural effusion Pneumothorax Pleural fluid studies neglected during last hospitalization except cytology (which was negative) and fluid protein (which was undetectable).  Fluid from chest tube probably false positive Lights.  Discussed with Radiology, the only pneumothorax was a tiny right apical PTX post chest tube insertion (expected).   CXR personally reviewed today, no PTX at all. -Follow fluid culture -Follow fluid cytology -Appreciate Pulmonology input; PTX resolved; no role for chest tube in CHF effusion, but will plan to monitor tube output over next 24 hours per their instructions, remove tomorrow if no PTX  Doubt pneumonia Clinical findings never really typical for pneumonia.  WBC normal. Procal unreliable in chemo/cancer,  but given it is undetectable and there is alternative better explanation for CXR opacities will stop empiric antibuiotics. MRSA nares negative -Stop antibiotics -Monitor fever curve  Diabetes Glucoses trending up.  Discussed with  -Continue insulin via Pump -CBGs TIDACHS  Other medications -Continue venlafaxine -Hold Breo  Chemotherapy associated anemia Hgb no change.        MDM and disposition: The below labs and imaging reports were reviewed and summarized above.  Case discussed with radiology, Pulmonlogy, images reviewed and summarized above.  Medication managemetn as above.  The patient was admitted with pleural effusion, found to hve new onset CHF.  This is a severe exacerbation of a new chronic disease, she requires ongoing IV Lasix and titration of new medications.        DVT prophylaxis: SCDs Code Status: FULL Family Communication: Husband and parents at bedside    Consultants:   Pulmonology  Cardiology  Procedures:   Chest tube placement 11/5  Antimicrobials:   Azithromycin 11/5 >> 11/7  Cefepime 11/5 >> 11/7  Flagyl 11/5 x1     Subjective: Chest pain resolved.  Good appetite.  No fever overnight.  No confusion, weakness.  No sputum, cough.  Swelling improving.       Objective: Vitals:   01/28/18 2114 01/29/18 0501 01/29/18 0513 01/29/18 0830  BP: 104/62 109/76  98/67  Pulse: 100 (!) 101  98  Resp: '18 20  18  ' Temp: 98.1 F (36.7 C) 98.3 F (36.8 C)  98.1 F (36.7 C)  TempSrc: Oral Oral  Oral  SpO2: 98% 95%  92%  Weight:   76.8 kg   Height:  Intake/Output Summary (Last 24 hours) at 01/29/2018 1259 Last data filed at 01/29/2018 1200 Gross per 24 hour  Intake 800.07 ml  Output 3275 ml  Net -2474.93 ml   Filed Weights   01/27/18 1507 01/28/18 0500 01/29/18 0513  Weight: 76.2 kg 75.1 kg 76.8 kg    Examination: General appearance:  Adult female, sitting in bed, no acute distress HEENT: Anicteric, conjunctiva pink,  lids and lashes normal.  Nose normal, no epistasxis, discharge. Lips moist, dentition normal.  OP moist, no oral lesions.  Hearing normal.  Skin: Warm and dry.  Pale, no jaundice.  No suspicious rashes or lesions. Cardiac: Tachycardic, regular, no murmurs.  JVP not visible.  Nonpitting LE edema. Respiratory: Respirations improved, rales on exam in bases, diminsehd on right.   Abdomen: Abdomen soft, no TTP, no guarding, no HSM. MSK: No deformities or effusions of large joints bilaterally, muscle bulk and tone normal. Neuro: Awake and alert, EOMI, speech fluent, moves all extremities with normal strength and coordination. Psych: oreinted to person, place and time, attention normal, affect normal.    Data Reviewed: I have personally reviewed following labs and imaging studies:  CBC: Recent Labs  Lab 01/27/18 1619 01/28/18 0205 01/29/18 0425  WBC 6.7 9.6 5.6  NEUTROABS 5.1 8.7*  --   HGB 11.3* 10.3* 10.4*  HCT 37.4 34.9* 35.5*  MCV 86.4 89.3 89.6  PLT 424* 358 592   Basic Metabolic Panel: Recent Labs  Lab 01/27/18 1619 01/28/18 0205 01/29/18 0425  NA 140 142 139  K 3.8 4.7 4.2  CL 106 112* 109  CO2 '27 22 22  ' GLUCOSE 94 157* 220*  BUN '12 11 19  ' CREATININE 0.85 0.73 0.94  CALCIUM 8.8* 7.9* 8.2*   GFR: Estimated Creatinine Clearance: 75.6 mL/min (by C-G formula based on SCr of 0.94 mg/dL). Liver Function Tests: Recent Labs  Lab 01/27/18 1619 01/28/18 0205  AST 23 19  ALT 21 19  ALKPHOS 65 56  BILITOT 0.8 1.3*  PROT 6.5 5.5*  ALBUMIN 3.3* 2.7*   No results for input(s): LIPASE, AMYLASE in the last 168 hours. No results for input(s): AMMONIA in the last 168 hours. Coagulation Profile: Recent Labs  Lab 01/27/18 2310  INR 1.11   Cardiac Enzymes: No results for input(s): CKTOTAL, CKMB, CKMBINDEX, TROPONINI in the last 168 hours. BNP (last 3 results) No results for input(s): PROBNP in the last 8760 hours. HbA1C: No results for input(s): HGBA1C in the last 72  hours. CBG: Recent Labs  Lab 01/28/18 1217 01/28/18 1545 01/28/18 2213 01/29/18 0825 01/29/18 1233  GLUCAP 126* 137* 200* 137* 257*   Lipid Profile: No results for input(s): CHOL, HDL, LDLCALC, TRIG, CHOLHDL, LDLDIRECT in the last 72 hours. Thyroid Function Tests: No results for input(s): TSH, T4TOTAL, FREET4, T3FREE, THYROIDAB in the last 72 hours. Anemia Panel: No results for input(s): VITAMINB12, FOLATE, FERRITIN, TIBC, IRON, RETICCTPCT in the last 72 hours. Urine analysis:    Component Value Date/Time   COLORURINE YELLOW 12/02/2017 1030   APPEARANCEUR HAZY (A) 12/02/2017 1030   LABSPEC 1.023 12/02/2017 1030   PHURINE 5.0 12/02/2017 1030   GLUCOSEU >=500 (A) 12/02/2017 1030   HGBUR MODERATE (A) 12/02/2017 1030   BILIRUBINUR NEGATIVE 12/02/2017 1030   BILIRUBINUR n 09/06/2014 0845   KETONESUR NEGATIVE 12/02/2017 1030   PROTEINUR NEGATIVE 12/02/2017 1030   UROBILINOGEN negative 09/06/2014 0845   NITRITE NEGATIVE 12/02/2017 1030   LEUKOCYTESUR MODERATE (A) 12/02/2017 1030   Sepsis Labs: '@[email or web address]' (procalcitonin:4,lacticacidven:4)  ) Recent Results (from  the past 240 hour(s))  MRSA PCR Screening     Status: None   Collection Time: 01/27/18 10:13 PM  Result Value Ref Range Status   MRSA by PCR NEGATIVE NEGATIVE Final    Comment:        The GeneXpert MRSA Assay (FDA approved for NASAL specimens only), is one component of a comprehensive MRSA colonization surveillance program. It is not intended to diagnose MRSA infection nor to guide or monitor treatment for MRSA infections. Performed at [facility], [place] [address] [place], [place] [address]   Body fluid culture     Status: None (Preliminary result)   Collection Time: 01/28/18  8:38 AM  Result Value Ref Range Status   Specimen Description   Final    PLEURAL RIGHT Performed at [facility], [place] [address] [place], [place] [address]    Special Requests   Final     Immunocompromised Performed at [facility], [place] [address], [place], [place] [address]    Gram Stain   Final    WBC PRESENT,BOTH PMN AND MONONUCLEAR NO ORGANISMS SEEN CYTOSPIN Gram Stain Report Called to,Read Back By and Verified With: Shon Hale RN '@[id number]'  ON 11.6.19 BY [facility] Performed at [facility], [place] [address], [place], [place] [address]    Culture   Final    NO GROWTH < 24 HOURS Performed at [facility] [address] [place], [place] [address]    Report Status PENDING  Incomplete         Radiology Studies: Dg Chest 2 View  Result Date: 01/27/2018 CLINICAL DATA:  Recent pneumonia and pleural effusions EXAM: CHEST - 2 VIEW COMPARISON:  January 14, 2018 FINDINGS: The heart size and mediastinal contours are stable. Left central venous line is unchanged. Bilateral pneumonias are identified, right worse than left. There is a right pleural effusion. There is interval developed moderate right pneumothorax. The visualized skeletal structures are stable. IMPRESSION: Moderate right pneumothorax noted. Bilateral pneumonias, right worse than left. Right pleural effusion. These results were called by telephone at the time of interpretation on 01/27/2018 at 2:46 pm to Elby Showers RN from Dr. Domenick Gong office, Who verbally acknowledged these results. Mr Ronnald Ramp stated she will call [facility] and give further direction to the patient. Electronically Signed   By: Abelardo Diesel M.D.   On: 01/27/2018 14:48   Dg Chest Port 1 View  Result Date: 01/29/2018 CLINICAL DATA:  Followup right pneumothorax and chest tube. EXAM: PORTABLE CHEST 1 VIEW COMPARISON:  01/27/2018. FINDINGS: A small caliber pigtail chest tube remains on the right. The previously seen tiny apical right pneumothorax is no longer visualized. Mild patchy opacities throughout much of the right lung with improvement. Interval clearing of the opacities in the left lung.  Stable enlarged cardiac silhouette. Small bilateral pleural effusions. Left subclavian porta catheter tip in the proximal superior vena cava. Right axillary surgical clips. Unremarkable bones. IMPRESSION: 1. Resolved right pneumothorax. 2. Improving right lung pneumonia or alveolar edema and resolved left lung alveolar edema. 3. Small bilateral pleural effusions. 4. Stable cardiomegaly. Electronically Signed   By: Claudie Revering M.D.   On: 01/29/2018 10:49   Dg Chest Portable 1 View  Result Date: 01/27/2018 CLINICAL DATA:  Initial evaluation for chest tube placement. EXAM: PORTABLE CHEST 1 VIEW COMPARISON:  Prior radiograph from earlier the same day. FINDINGS: Cardiomegaly, stable. Mediastinal silhouette within normal limits. Left-sided Port-A-Cath remains in place. Interval placement  of pigtail right-sided chest tube with tip overlying the mid lower right lung. There is a trace apical pneumothorax at the right lung apex, pleural edge at the third intercostal space. Patchy bibasilar opacities, right greater than left again seen, stable from previous. No appreciable pleural effusion. Underlying pulmonary vascular congestion without overt pulmonary edema. Osseous structures unchanged. IMPRESSION: 1. Interval placement of pigtail right-sided chest tube with tip overlying the mid right lower lobe. Tiny right apical pneumothorax as above. 2. Otherwise no significant interval change in multifocal bilateral airspace opacities, right greater than left. Electronically Signed   By: Jeannine Boga M.D.   On: 01/27/2018 20:57        Scheduled Meds: . alteplase  2 mg Intracatheter Once  . Chlorhexidine Gluconate Cloth  6 each Topical Daily  . furosemide  40 mg Intravenous BID  . losartan  25 mg Oral Daily  . potassium chloride  20 mEq Oral BID  . sodium chloride flush  10-40 mL Intracatheter Q12H  . spironolactone  12.5 mg Oral Daily  . venlafaxine XR  75 mg Oral Q breakfast   Continuous Infusions:     LOS: 2 days    Time spent: 35 minutes    Edwin Dada, MD [facility] 01/29/2018, 12:59 PM     Pager [phone] --- please page though AMION:  [email or web address] If 7PM-7AM, please contact night-coverage

## 2018-01-29 NOTE — Progress Notes (Signed)
Called to bedside for chest pain.  Paitnet developed sudden severe right shoulder and back and chest pain.  Family ?if chest tube moved.  Patient BP and O2 sat normal, HR 110.  Morphine IV given without relief.    Stat CXR showed no reoccurrence of PTX by my read, no new opacity, however, chest tube migrated out.  At bedside, discussed with patient, husband that tube migrated out.  Patient identified, sutures removed with scissors, patient with large inspiration and slow hum, tube removed in single motion.  Pain immediately relieved.  No bleeding.  Pressure held with vaseline guaze for 2 minutes.  Incision small (for pigtail) so no sutures needed.  Discussed with pulm.  Risks of reaccumulation of PTX discussed, CXR for tomorrow ordered.

## 2018-01-29 NOTE — Progress Notes (Signed)
VAST RN accessed pt's port earlier today for use while in hospital. Chest x-ray showed port tip to be in appropriate position in SVC. Pt reported with last hospitalization, no blood return was obtainable. However, she didn't wish to remedy the problem as she thought her port would be removed before being used again.  VAST RN discussed need for port to be declotted at this time to prevent possible infection or complications. Pt verbalized agreement.  VAST RN instilled 2 mL Alteplase (Cath-Flo) via port at 1435 and capped line. Advised pt not to let anyone access port, placed Do not Use sticker on line, and informed unit RN regarding no use of line until Cath-Flo removed.

## 2018-01-29 NOTE — Progress Notes (Signed)
Pt called RN to room for SOB, 10/10 back pain. Informed MD and stat chest xray was ordered. MD at bedside, xray showed possible movement of chest tube. Chest tube was removed at bedside, immediately pt reported decrease in pain. Pt resting comfortable in bed, no questions or concerns.

## 2018-01-29 NOTE — Consult Note (Addendum)
Cardiology Consultation:   Patient ID: Stephanie Holden MRN: [id number]; DOB: 06/02/1975  Admit date: 01/27/2018 Date of Consult: 01/29/2018  Primary Care Provider: Haywood Pao, MD Primary Cardiologist: Stephanie Bickers, MD  Primary Electrophysiologist:  N/A   Patient Profile:   Stephanie Holden is a 42 y.o. female with a hx of breast cancer s/p bilateral mastectomies, treatment w/ chemo (adriamycin), IDDM on insulin pump and thyroid goiter, admitted for acute hypoxic respiratory failure in the setting of pleural effusion, possible PNA and acute systolic HF with newly reduced LVEF, now 25-30%. Cardiology consulted for management of HF, at the request of Dr. Loleta Holden, Internal Medicine.   History of Present Illness:   Stephanie Holden was diagnosed with breast cancer 05/09/17. Found to have malignant neoplasm of axillary tail of right breast, estrogen receptor positive. She underwent bilateral mastectomies on 06/05/17, followed by axillary lymph node dissection on 06/26/17. She started chemo 07/22/17 with adriamycin and cytoxan x4, then taxol weekly x12. She is now undergoing radiation treatments. Given the potential for Adriamycin cardiotoxicity, she was referred to Dr. Haroldine Holden for surveillance. Her initial echocardiogram prior to the start of chemo 06/20/17 showed normal LVEF at 55-60%. Global LV longitudinal strain was normal at -18.6%. She had a repeat study 3 months later that showed no significant change. EF was 60-65% and GLS was 18.7%. She was seen in the AHF Clinic on 6/28 and demonstrated no signs of HF on exam. Plan was to f/u again in 6 months.   10/22-10/25, she was admitted for HCAP and also found to have a moderate to large right pleural effusion. She was treated w/ IV antibiotics and underwent thoracentesis.   She presented back to the [facility] ED on 11/5 w/ CC of worsening dyspnea. She was initially thought to have a pneumothorax, but this was incorrect, the patient had a pleural effusion. Also  concerns for unresolved PNA. Pt also endorsed new LEE and orthopnea, concerning for acute CHF. She was readmitted by IM, started on IV antibiotics and IV Lasix. Repeat echo was done 11/6 and demonstrated reduced LVEF, now at 25-30%, diffuse hypokinesis, moderate diastolic dysfunction (G2), normal RV size with mildly decreased systolic function.   She has had good urinary output thus far w/ Lasix, -2.90 L out yesterday. 1.5L out from chest tube. She notes her breathing has improved as well as her LEE. Her kidney function is normal. SCr 0.94. K is WNL. BP soft at 109/76. HR is in the 90s.    Past Medical History:  Diagnosis Date  . Cancer [facility])    Breast cancer - right  . Constipation   . Depression   . Genetic testing 05/27/2017   Breast/GYN panel (23 genes) @ [facility] - No pathogenic mutations detected  . GERD (gastroesophageal reflux disease)   . Insulin dependent type 1 diabetes mellitus (Stephanie Holden)   . Insulin pump in place   . PONV (postoperative nausea and vomiting)   . Stenosing tenosynovitis of thumb 06/2014   right  . Thyroid goiter     Past Surgical History:  Procedure Laterality Date  . AXILLARY LYMPH NODE DISSECTION Right 06/26/2017   Procedure: AXILLARY LYMPH NODE DISSECTION;  Surgeon: Stephanie Klein, MD;  Location: [place];  Service: General;  Laterality: Right;  . CARPAL TUNNEL RELEASE Bilateral 10/28/2013   Procedure: BILATERAL CARPAL TUNNEL RELEASE;  Surgeon: Daryll Brod, MD;  Location: [place];  Service: Orthopedics;  Laterality: Bilateral;  . CESAREAN SECTION  2000; 08/20/2001  . ESSURE TUBAL  LIGATION  2005   failed, tubal puncture  . LAPAROSCOPIC TUBAL LIGATION  04/02/2007   removal Essure  . LAPAROSCOPIC UNILATERAL SALPINGECTOMY  2005  . MASTECTOMY W/ SENTINEL NODE BIOPSY Bilateral 06/05/2017   Procedure: BILATERAL MASTECTOMIES  WITH RIGHT SENTINEL LYMPH NODE BIOPSY;  Surgeon: Stephanie Klein, MD;  Location: [place];  Service: General;  Laterality: Bilateral;  .  PORTACATH PLACEMENT Left 06/26/2017   Procedure: INSERTION PORT-A-CATH;  Surgeon: Stephanie Klein, MD;  Location: [place];  Service: General;  Laterality: Left;  . TRIGGER FINGER RELEASE Left 04/12/2014   Procedure: RELEASE A-1 PULLEY LEFT THUMB;  Surgeon: Daryll Brod, MD;  Location: [place];  Service: Orthopedics;  Laterality: Left;  . TRIGGER FINGER RELEASE Right 07/19/2014   Procedure: RELEASE TRIGGER FINGER/A-1 PULLEY RIGHT THUMB;  Surgeon: Daryll Brod, MD;  Location: [place];  Service: Orthopedics;  Laterality: Right;     Home Medications:  Prior to Admission medications   Medication Sig Start Date End Date Taking? Authorizing Provider  albuterol (PROVENTIL HFA;VENTOLIN HFA) 108 (90 Base) MCG/ACT inhaler Inhale 1-2 puffs into the lungs every 6 (six) hours as needed for wheezing or shortness of breath.   Yes [provider]  fluticasone furoate-vilanterol (BREO ELLIPTA) 200-25 MCG/INH AEPB Inhale 1 puff into the lungs daily.   Yes [provider]  guaiFENesin-dextromethorphan (ROBITUSSIN DM) 100-10 MG/5ML syrup Take 5 mLs by mouth every 4 (four) hours as needed for cough. 01/16/18  Yes Shelly Coss, MD  hyaluronate sodium (RADIAPLEXRX) GEL Apply 1 application topically once.   Yes [provider]  insulin aspart (NOVOLOG) 100 UNIT/ML injection Inject 45 Units into the skin See admin instructions. Use via insulin pump up to 45 units daily   Yes [provider]  LORazepam (ATIVAN) 0.5 MG tablet TAKE 1 TABLET BY MOUTH EVERY 6 HOURS AS NEEDED FOR ANXIETY OR SLEEP Patient taking differently: Take 0.5 mg by mouth every 6 (six) hours as needed for anxiety or sleep.  12/23/17  Yes Stephanie Lose, MD  meloxicam (MOBIC) 7.5 MG tablet Take 7.5 mg by mouth daily.   Yes [provider]  oxyCODONE-acetaminophen (PERCOCET/ROXICET) 5-325 MG tablet Take 1 tablet by mouth every 8 (eight) hours as needed for severe pain. 12/22/17  Yes Stephanie Lose, MD  Probiotic Product (PROBIOTIC DAILY PO) Take 1 capsule by mouth at bedtime.    Yes [provider]  venlafaxine XR (EFFEXOR-XR) 75 MG 24 hr capsule Take 1 capsule (75 mg total) by mouth daily with breakfast. Patient taking differently: Take 75 mg by mouth.  11/18/17  Yes Stephanie Lose, MD  lidocaine-prilocaine (EMLA) cream Apply to affected area once Patient not taking: Reported on 01/01/2018 06/20/17   Stephanie Lose, MD  ondansetron (ZOFRAN) 8 MG tablet Take 1 tablet (8 mg total) by mouth 2 (two) times daily as needed. Start on the third day after chemotherapy. Patient not taking: Reported on 01/01/2018 06/20/17   Stephanie Lose, MD    Inpatient Medications: Scheduled Meds: . Chlorhexidine Gluconate Cloth  6 each Topical Daily  . furosemide  40 mg Intravenous BID  . potassium chloride  20 mEq Oral BID  . venlafaxine XR  75 mg Oral Q breakfast   Continuous Infusions: . azithromycin 500 mg (01/29/18 0545)  . ceFEPime (MAXIPIME) IV 2 g (01/29/18 0034)   PRN Meds: acetaminophen **OR** acetaminophen, guaiFENesin-dextromethorphan, LORazepam, morphine injection, ondansetron **OR** ondansetron (ZOFRAN) IV, oxyCODONE-acetaminophen  Allergies:    Allergies  Allergen Reactions  . Dilaudid [  Hydromorphone Hcl] Itching and Other (See Comments)    UNCONTROLLABLE CRYING    Social History:   Social History   Socioeconomic History  . Marital status: Married    Spouse name: Not on file  . Number of children: Not on file  . Years of education: Not on file  . Highest education level: Not on file  Occupational History  . Not on file  Social Needs  . Financial resource strain: Not on file  . Food insecurity:    Worry: Not on file    Inability: Not on file  . Transportation needs:    Medical: No    Non-medical: No  Tobacco Use  . Smoking status: Former Smoker    Last attempt to quit: 03/24/2001    Years since quitting: 16.8  . Smokeless tobacco: Never Used  Substance  and Sexual Activity  . Alcohol use: Not Currently  . Drug use: No  . Sexual activity: Yes    Partners: Male    Birth control/protection: Surgical    Comment: tubal ligation & 1 tube removed  Lifestyle  . Physical activity:    Days per week: Not on file    Minutes per session: Not on file  . Stress: Not on file  Relationships  . Social connections:    Talks on phone: Not on file    Gets together: Not on file    Attends religious service: Not on file    Active member of club or organization: Not on file    Attends meetings of clubs or organizations: Not on file    Relationship status: Not on file  . Intimate partner violence:    Fear of current or ex partner: No    Emotionally abused: No    Physically abused: No    Forced sexual activity: No  Other Topics Concern  . Not on file  Social History Narrative  . Not on file    Family History:    Family History  Problem Relation Age of Onset  . Hypertension Mother   . Hypertension Father   . Breast cancer Other 54       paternal grandfather's niece     ROS:  Please see the history of present illness.   All other ROS reviewed and negative.     Physical Exam/Data:   Vitals:   01/28/18 1704 01/28/18 2114 01/29/18 0501 01/29/18 0513  BP: (!) 101/58 104/62 109/76   Pulse: 94 100 (!) 101   Resp:  18 20   Temp: 98.4 F (36.9 C) 98.1 F (36.7 C) 98.3 F (36.8 C)   TempSrc: Oral Oral Oral   SpO2: 95% 98% 95%   Weight:    76.8 kg  Height:        Intake/Output Summary (Last 24 hours) at 01/29/2018 0754 Last data filed at 01/29/2018 0546 Gross per 24 hour  Intake 910.07 ml  Output 4475 ml  Net -3564.93 ml   Filed Weights   01/27/18 1507 01/28/18 0500 01/29/18 0513  Weight: 76.2 kg 75.1 kg 76.8 kg   Body mass index is 30.98 kg/m.  General:  Young WF, well nourished, well developed, in no acute distress HEENT: normal Lymph: no adenopathy Neck: no JVD Endocrine:  No thryomegaly Vascular: No carotid bruits; FA  pulses 2+ bilaterally without bruits  Cardiac:  normal S1, S2; RRR; no murmur  Lungs:  Crackles in the LLL field Abd: soft, nontender, no hepatomegaly  Ext: no edema Musculoskeletal:  No deformities,  BUE and BLE strength normal and equal, large posterior thorax tattoo  Skin: warm and dry  Neuro:  CNs 2-12 intact, no focal abnormalities noted Psych:  Normal affect   EKG:  The EKG was personally reviewed and demonstrates:  NSR Telemetry:  Telemetry was personally reviewed and demonstrates:  NSR, no ventricular ectopy   Relevant CV Studies: 2D Echo 09/19/17 Study Conclusions  - Left ventricle: The cavity size was normal. Wall thickness was   normal. Systolic function was normal. The estimated ejection   fraction was in the range of 60% to 65%. Wall motion was normal;   there were no regional wall motion abnormalities. Doppler   parameters are consistent with abnormal left ventricular   relaxation (grade 1 diastolic dysfunction). - Impressions: LS 11.90 GLS 18.7%  Impressions:  - LS 11.90 GLS 18.7% __________________________________  2D Echo 01/28/18 Study Conclusions  - Procedure narrative: Transthoracic echocardiography. Image   quality was poor. The study was technically difficult, as a   result of poor sound wave transmission. Intravenous contrast   (Definity) was administered. - Left ventricle: The cavity size was normal. Wall thickness was   normal. Systolic function was severely reduced. The estimated   ejection fraction was in the range of 25% to 30%. Diffuse   hypokinesis. Features are consistent with a pseudonormal left   ventricular filling pattern, with concomitant abnormal relaxation   and increased filling pressure (grade 2 diastolic dysfunction). - Aortic valve: There was no stenosis. - Mitral valve: Mildly calcified annulus. There was mild   regurgitation. - Left atrium: The atrium was mildly dilated. - Right ventricle: The cavity size was normal. Systolic  function   was mildly reduced. - Pulmonary arteries: PA peak pressure: 36 mm Hg (S). - Systemic veins: IVC measured 1.8 cm with < 50% respirophasic   variation, suggesting RA pressure 8 mmHg. - Pericardium, extracardiac: There is a pleural effusion. A trivial   pericardial effusion was identified posterior to the heart.  Impressions:  - Normal LV size with EF 25-30%, diffuse hypokinesis. Moderate   diastolic dysfunction. Normal RV size with mildly decreased   systolic function. Mild MR. Marked change in EF compared to prior   study.  Laboratory Data:  Chemistry Recent Labs  Lab 01/27/18 1619 01/28/18 0205 01/29/18 0425  NA 140 142 139  K 3.8 4.7 4.2  CL 106 112* 109  CO2 27 22 22   GLUCOSE 94 157* 220*  BUN 12 11 19   CREATININE 0.85 0.73 0.94  CALCIUM 8.8* 7.9* 8.2*  GFRNONAA >60 >60 >60  GFRAA >60 >60 >60  ANIONGAP 7 8 8     Recent Labs  Lab 01/27/18 1619 01/28/18 0205  PROT 6.5 5.5*  ALBUMIN 3.3* 2.7*  AST 23 19  ALT 21 19  ALKPHOS 65 56  BILITOT 0.8 1.3*   Hematology Recent Labs  Lab 01/27/18 1619 01/28/18 0205 01/29/18 0425  WBC 6.7 9.6 5.6  RBC 4.33 3.91 3.96  HGB 11.3* 10.3* 10.4*  HCT 37.4 34.9* 35.5*  MCV 86.4 89.3 89.6  MCH 26.1 26.3 26.3  MCHC 30.2 29.5* 29.3*  RDW 17.1* 17.4* 17.5*  PLT 424* 358 336   Cardiac EnzymesNo results for input(s): TROPONINI in the last 168 hours. No results for input(s): TROPIPOC in the last 168 hours.  BNP Recent Labs  Lab 01/28/18 0737  BNP 738.3*    DDimer No results for input(s): DDIMER in the last 168 hours.  Radiology/Studies:  Dg Chest 2 View  Result Date: 01/27/2018  CLINICAL DATA:  Recent pneumonia and pleural effusions EXAM: CHEST - 2 VIEW COMPARISON:  January 14, 2018 FINDINGS: The heart size and mediastinal contours are stable. Left central venous line is unchanged. Bilateral pneumonias are identified, right worse than left. There is a right pleural effusion. There is interval developed moderate  right pneumothorax. The visualized skeletal structures are stable. IMPRESSION: Moderate right pneumothorax noted. Bilateral pneumonias, right worse than left. Right pleural effusion. These results were called by telephone at the time of interpretation on 01/27/2018 at 2:46 pm to Elby Showers RN from Dr. Domenick Gong office, Who verbally acknowledged these results. Mr Ronnald Ramp stated she will call [phone] and give further direction to the patient. Electronically Signed   By: Abelardo Diesel M.D.   On: 01/27/2018 14:48   Dg Chest Portable 1 View  Result Date: 01/27/2018 CLINICAL DATA:  Initial evaluation for chest tube placement. EXAM: PORTABLE CHEST 1 VIEW COMPARISON:  Prior radiograph from earlier the same day. FINDINGS: Cardiomegaly, stable. Mediastinal silhouette within normal limits. Left-sided Port-A-Cath remains in place. Interval placement of pigtail right-sided chest tube with tip overlying the mid lower right lung. There is a trace apical pneumothorax at the right lung apex, pleural edge at the third intercostal space. Patchy bibasilar opacities, right greater than left again seen, stable from previous. No appreciable pleural effusion. Underlying pulmonary vascular congestion without overt pulmonary edema. Osseous structures unchanged. IMPRESSION: 1. Interval placement of pigtail right-sided chest tube with tip overlying the mid right lower lobe. Tiny right apical pneumothorax as above. 2. Otherwise no significant interval change in multifocal bilateral airspace opacities, right greater than left. Electronically Signed   By: Jeannine Boga M.D.   On: 01/27/2018 20:57    Assessment and Plan:   MARIYAM Holden is a 42 y.o. female with a hx of breast cancer s/p bilateral mastectomies, treatment w/ chemo (adriamycin), IDDM on insulin pump and thyroid goiter, admitted for acute hypoxic respiratory failure in the setting of pleural effusion, possible PNA and acute systolic HF with  newly reduced LVEF, now 25-30%. Cardiology consulted for management of HF, at the request of Dr. Loleta Holden, Internal Medicine.   1. Cardiomyopathy: Echo shows newly reduced LVEF, down to 25-30% (previously 60-65%). Tele reviewed, no ventricular ectopy. Likely etiology is adriamycin cardiotoxicity. Last chemo session was 12/09/17.  Recommend medical therapy for systolic HF as BP allows. Consider Entresto as first line therapy (pt is of child bearing age and will need to avoid pregnancy). Also consider additional of low dose  blocker therapy. Given soft BP, metoprolol succinate would be a better option vs Coreg. She can continue f/u in the AHF clinic post discharge.   2. Acute Systolic HF: her dyspnea and LEE both improved. Good response to IV diuretic. Still slight crackles in the LLL. Continue IV Lasix. She should go home with a Rx for PO Lasix, but may be able to use only PRN based on weight gain and edema.   3. Pleural Effusion: management per IM.   4. HCAP: on IV antibiotics. Management per IM.   5. Breast Cancer: s/p double mastectomies and completed chemo. Currently getting radiation treatments. Management per oncology.    For questions or updates, please contact [place] Please consult [email or web address] for contact info under     Signed, Lyda Jester, PA-C  01/29/2018 7:54 AM  As above, patient seen and examined.  Briefly she is a 42 year old female with past medical history of breast cancer status post bilateral mastectomies, chemotherapy  including Adriamycin, diabetes mellitus for evaluation of acute systolic congestive heart failure.  Patient underwent bilateral mastectomies March 2019 and was treated with chemotherapy with Adriamycin/Cytoxan.  Echocardiogram in June showed normal LV function and mild diastolic dysfunction.  Patient developed increasing dyspnea October 2019.  She initially complained of dyspnea on exertion followed by orthopnea.  She was admitted and found to have  a large right pleural effusion.  She was treated with antibiotics for possible pneumonia and underwent thoracentesis.  Cytology and cultures negative.  She was discharged but developed recurrent dyspnea and orthopnea.  She has been readmitted.  Echocardiogram shows reduced LV function and cardiology asked to evaluate.  Laboratory showed BUN 19, creatinine 0.94.  BNP 738.  White blood cell count 5.6.  Echocardiogram personally reviewed and shows ejection fraction 25-30%.  1 acute systolic congestive heart failure-patient presents with CHF and remains somewhat volume overloaded.  Continue Lasix 40 mg IV twice daily.  Add Spironolactone 12.5 mg daily.  Follow renal function.  Needs fluid restriction to 1 L daily and low-sodium diet.  2 cardiomyopathy-given temporal relationship to recent chemotherapy likely Adriamycin induced.  Blood pressure is borderline with systolic of 67-737.  I will add low-dose ARB to see if she tolerates.  Hopefully can transition to [facility] later if blood pressure allows.  I will hold on beta-blockade given acute CHF and borderline blood pressure.  We will add later as blood pressure allows.  3 pleural effusion-cytology and cultures negative.  Chest tube management per primary service.  4 breast cancer-status post mastectomies and chemotherapy-followed by oncology.  Kirk Ruths, MD

## 2018-01-30 ENCOUNTER — Inpatient Hospital Stay (HOSPITAL_COMMUNITY): Payer: BLUE CROSS/BLUE SHIELD

## 2018-01-30 ENCOUNTER — Ambulatory Visit
Admission: RE | Admit: 2018-01-30 | Discharge: 2018-01-30 | Disposition: A | Discharge status: Home / Self Care | Payer: BLUE CROSS/BLUE SHIELD | Source: Ambulatory Visit | Attending: Radiation Oncology | Admitting: Radiation Oncology

## 2018-01-30 DIAGNOSIS — C50611 Malignant neoplasm of axillary tail of right female breast: Secondary | ICD-10-CM | POA: Diagnosis not present

## 2018-01-30 LAB — BASIC METABOLIC PANEL
ANION GAP: 8 (ref 5–15)
BUN: 14 mg/dL (ref 6–20)
CO2: 29 mmol/L (ref 22–32)
Calcium: 8.2 mg/dL — ABNORMAL LOW (ref 8.9–10.3)
Chloride: 103 mmol/L (ref 98–111)
Creatinine, Ser: 0.69 mg/dL (ref 0.44–1.00)
GFR calc Af Amer: >60 mL/min (ref >60)
GLUCOSE: 224 mg/dL — AB (ref 70–99)
POTASSIUM: 3.8 mmol/L (ref 3.5–5.1)
Sodium: 140 mmol/L (ref 135–145)

## 2018-01-30 LAB — GLUCOSE, CAPILLARY
GLUCOSE-CAPILLARY: 174 mg/dL — AB (ref 70–99)
GLUCOSE-CAPILLARY: 236 mg/dL — AB (ref 70–99)
Glucose-Capillary: 205 mg/dL — ABNORMAL HIGH (ref 70–99)
Glucose-Capillary: 281 mg/dL — ABNORMAL HIGH (ref 70–99)

## 2018-01-30 LAB — CBC
HEMATOCRIT: 36.3 % (ref 36.0–46.0)
Hemoglobin: 11 g/dL — ABNORMAL LOW (ref 12.0–15.0)
MCH: 26.3 pg (ref 26.0–34.0)
MCHC: 30.3 g/dL (ref 30.0–36.0)
MCV: 86.6 fL (ref 80.0–100.0)
NRBC: 0 % (ref 0.0–0.2)
PLATELETS: 348 10*3/uL (ref 150–400)
RBC: 4.19 MIL/uL (ref 3.87–5.11)
RDW: 17.3 % — ABNORMAL HIGH (ref 11.5–15.5)
WBC: 6.1 10*3/uL (ref 4.0–10.5)

## 2018-01-30 MED ORDER — ENOXAPARIN SODIUM 40 MG/0.4ML ~~LOC~~ SOLN
40.0000 mg | Freq: Every morning | SUBCUTANEOUS | Status: DC
  Administered 2018-01-30 – 2018-02-01 (×3): 40 mg via SUBCUTANEOUS
  Filled 2018-01-30 (×3): qty 0.4

## 2018-01-30 MED ORDER — SACUBITRIL-VALSARTAN 24-26 MG PO TABS
1.0000 | ORAL_TABLET | Freq: Two times a day (BID) | ORAL | Status: DC
  Administered 2018-01-30 – 2018-01-31 (×2): 1 via ORAL
  Filled 2018-01-30 (×3): qty 1

## 2018-01-30 NOTE — Progress Notes (Signed)
PROGRESS NOTE    Stephanie Holden  GBT:[id number] DOB: 09-10-1975 DOA: 01/27/2018 PCP: Haywood Pao, MD      Brief Narrative:  Stephanie Holden is a 42 y.o. F with hx BrCA s/p bilateral mastect, chemo with adriamycin and now on radiation therapy, recent admission for pleural effusion and also IDDM on insulin pump who presents with dyspnea.  In ER, CXR initially interpreted by Radiology as pneumothorax.  Chest tube placed by EDP, but no air return, only fluid.  Subsequent overread by Pulmonology confirms patient had pleural effusion only, similar to recent.  Admitted for further management of pleural effusion.      Assessment & Plan:  Acute hypoxic respiratory failure Initially thought to be from pneumothorax.  This was incorrect, the patient had a pleural effusion and pulmonary edema from new onset CHF.   Acute systolic heart failure Patient reports progressive DOE, leg swelling, orthopnea in retrospect.  On admission, BNP elevated.  Echo showed EF 25% down from 60-65% prior to adriamycin.  Net negative 0.5L yesterday. Cr stable, K stable.  Orthopnea improved.  Effusion mostly gone but edema on CXR still present.  Tele normal. -Continue Lasix BID, K suppl -Cardiology have started Spironolactone and Entresto -Plan for metop at discharge if BP allows -Continue daily creatinine -Daily weights, strict I/O   Pleural effusion, likely CHF related Negative for growth.  I have extremely low suspicion for infection/parapneumonic effusion.    Pneumothorax X-rays reviewed carefully with radiology.  There was no pneumothorax before chest tube.  Post-thoracostomy tube placement, there has been a tiny right apical air bleb visible to Radiology, although not to me.  This did not enlarge after removal of tube today.  Discussed informally with CT surgery today, if something like this persisted after chest tube removal, but did not *enlarge* as this hasn't, it has extremely high likelihood to resolve on  its own in 1-2 weeks.  They would typically re-image in the office in a week to ensure shrinkage or stability, then re-image as needed if new symptoms only, after that.  Doubt pneumonia Clinical findings never really typical for pneumonia.  WBC normal. Procal unreliable in chemo/cancer, but given it is undetectable and there is alternative better explanation for CXR opacities will stop empiric antibuiotics. Abx stopped, no new fever.  Diabetes Glucoses trending up still.   -Continue insulin via Pump -Discussed with DM coord, they will check pump site, call Endo office for setting changes if needed -CBGs TIDACHS  Other medications -Continue venlafaxine -Hold Breo  Chemotherapy associated anemia Hgb stable at 11.        MDM and disposition: The below labs and imaging reports were reviewed and summarized above.  Medication managmenet as above.  Discussed with Cardiology.  X-ray personally reviewed, showed no effusion, no pneumothorax per my read, still edema.     Th was admitted with new onset CHF.  This is a severe exacerbation of her chronic disease, and requires ongoing IV Lasix, titration of heart failure medicines, close electrolyte monitoring.         DVT prophylaxis: Lovenox Code Status: FULL Family Communication: Parents at the bedside    Consultants:   Pulmonology  Cardiology  Procedures:   Chest tube placement 11/5  Antimicrobials:   Azithromycin 11/5 >> 11/7  Cefepime 11/5 >> 11/7  Flagyl 11/5 x1     Subjective: No new shortness of breath, able to ambulate a little bit, orthopnea improved.  No fever, sputum, hemoptysis.  Leg swelling is resolved.  Appetite good.        Objective: Vitals:   01/29/18 1428 01/29/18 1621 01/29/18 2022 01/30/18 0458  BP: 111/70 106/75 110/63 122/79  Pulse: (!) 102 (!) 109 (!) 106 98  Resp: 20 (!) '22 18 18  ' Temp: 98.1 F (36.7 C)  98.2 F (36.8 C) 98.6 F (37 C)  TempSrc: Oral  Oral Oral  SpO2: 95% 98%  91% 96%  Weight:      Height:        Intake/Output Summary (Last 24 hours) at 01/30/2018 1340 Last data filed at 01/30/2018 1012 Gross per 24 hour  Intake 750 ml  Output 625 ml  Net 125 ml   Filed Weights   01/27/18 1507 01/28/18 0500 01/29/18 0513  Weight: 76.2 kg 75.1 kg 76.8 kg    Examination: General appearance: Adult female, sitting in bed, no acute distress, interactive. HEENT: Alopecia.  Anicteric, conjunctival pink, lids and lashes normal.  No nasal deformity, discharge, or epistaxis.  Lips moist, dentition normal.  Oropharynx moist, no oral lesions, hearing normal. Skin: Warm and dry, pale without jaundice.  No suspicious rashes or lesions. Cardiac: Heart rate regular, rhythm regular, no murmurs appreciated, JVP normal, no lower extremity pitting edema. Respiratory: Normal respiratory rate and rhythm, no rales or wheezes. Abdomen: Abdomen soft, no TTP, no HSM. MSK: No deformities or effusions of large joints bilaterally, muscle bulk and tone normal. Neuro: Awake and alert, extraocular movements intact, speech fluent, moves all extremities with normal strength and coordination Psych: Oriented to person, place, and time.  Attention and affect normal.   Data Reviewed: I have personally reviewed following labs and imaging studies:  CBC: Recent Labs  Lab 01/27/18 1619 01/28/18 0205 01/29/18 0425 01/30/18 0334  WBC 6.7 9.6 5.6 6.1  NEUTROABS 5.1 8.7*  --   --   HGB 11.3* 10.3* 10.4* 11.0*  HCT 37.4 34.9* 35.5* 36.3  MCV 86.4 89.3 89.6 86.6  PLT 424* 358 336 196   Basic Metabolic Panel: Recent Labs  Lab 01/27/18 1619 01/28/18 0205 01/29/18 0425 01/30/18 0334  NA 140 142 139 140  K 3.8 4.7 4.2 3.8  CL 106 112* 109 103  CO2 '27 22 22 29  ' GLUCOSE 94 157* 220* 224*  BUN '12 11 19 14  ' CREATININE 0.85 0.73 0.94 0.69  CALCIUM 8.8* 7.9* 8.2* 8.2*   GFR: Estimated Creatinine Clearance: 88.8 mL/min (by C-G formula based on SCr of 0.69 mg/dL). Liver Function  Tests: Recent Labs  Lab 01/27/18 1619 01/28/18 0205  AST 23 19  ALT 21 19  ALKPHOS 65 56  BILITOT 0.8 1.3*  PROT 6.5 5.5*  ALBUMIN 3.3* 2.7*   No results for input(s): LIPASE, AMYLASE in the last 168 hours. No results for input(s): AMMONIA in the last 168 hours. Coagulation Profile: Recent Labs  Lab 01/27/18 2310  INR 1.11   Cardiac Enzymes: No results for input(s): CKTOTAL, CKMB, CKMBINDEX, TROPONINI in the last 168 hours. BNP (last 3 results) No results for input(s): PROBNP in the last 8760 hours. HbA1C: No results for input(s): HGBA1C in the last 72 hours. CBG: Recent Labs  Lab 01/29/18 1233 01/29/18 1650 01/29/18 2056 01/30/18 0802 01/30/18 1157  GLUCAP 257* 134* 179* 205* 281*   Lipid Profile: No results for input(s): CHOL, HDL, LDLCALC, TRIG, CHOLHDL, LDLDIRECT in the last 72 hours. Thyroid Function Tests: No results for input(s): TSH, T4TOTAL, FREET4, T3FREE, THYROIDAB in the last 72 hours. Anemia Panel: No results for input(s): VITAMINB12, FOLATE, FERRITIN, TIBC, IRON,  RETICCTPCT in the last 72 hours. Urine analysis:    Component Value Date/Time   COLORURINE YELLOW 12/02/2017 1030   APPEARANCEUR HAZY (A) 12/02/2017 1030   LABSPEC 1.023 12/02/2017 1030   PHURINE 5.0 12/02/2017 1030   GLUCOSEU >=500 (A) 12/02/2017 1030   HGBUR MODERATE (A) 12/02/2017 1030   BILIRUBINUR NEGATIVE 12/02/2017 1030   BILIRUBINUR n 09/06/2014 0845   KETONESUR NEGATIVE 12/02/2017 1030   PROTEINUR NEGATIVE 12/02/2017 1030   UROBILINOGEN negative 09/06/2014 0845   NITRITE NEGATIVE 12/02/2017 1030   LEUKOCYTESUR MODERATE (A) 12/02/2017 1030   Sepsis Labs: '@[email or web address]' (procalcitonin:4,lacticacidven:4)  ) Recent Results (from the past 240 hour(s))  MRSA PCR Screening     Status: None   Collection Time: 01/27/18 10:13 PM  Result Value Ref Range Status   MRSA by PCR NEGATIVE NEGATIVE Final    Comment:        The GeneXpert MRSA Assay (FDA approved for NASAL specimens only),  is one component of a comprehensive MRSA colonization surveillance program. It is not intended to diagnose MRSA infection nor to guide or monitor treatment for MRSA infections. Performed at [facility], [place] [address] [place], [place] [address]   Body fluid culture     Status: None (Preliminary result)   Collection Time: 01/28/18  8:38 AM  Result Value Ref Range Status   Specimen Description   Final    PLEURAL RIGHT Performed at [facility], [place] [address] [place], [place] [address]    Special Requests   Final    Immunocompromised Performed at [facility], [place] [address] [place], [place] [address]    Gram Stain   Final    WBC PRESENT,BOTH PMN AND MONONUCLEAR NO ORGANISMS SEEN CYTOSPIN Gram Stain Report Called to,Read Back By and Verified With: Shon Hale RN '@[email or web address]'  ON 11.6.19 BY [facility] Performed at [facility], [place] [address] [place], [place] [address]    Culture   Final    NO GROWTH 2 DAYS Performed at [place] [address] [place], [place] [address]    Report Status PENDING  Incomplete         Radiology Studies: Dg Chest Port 1 View  Result Date: 01/30/2018 CLINICAL DATA:  Chest tube removal EXAM: PORTABLE CHEST 1 VIEW COMPARISON:  01/29/2018 FINDINGS: Right thoracostomy tube has now been completely removed. No change in a miniscule amount of pleural air at the apex. Airspace densities right more than left is slightly improved. Left lung is largely clear. Power port tip in the SVC at the azygos level. IMPRESSION: Right chest tube completely removed. No change in the tiny amount of pleural air at the pleural apex. Airspace shadowing on the right is slightly improved. No worsening or new findings. Electronically Signed   By: Nelson Chimes M.D.   On: 01/30/2018 06:28   Dg Chest Port 1 View  Result Date: 01/29/2018 CLINICAL DATA:  Worsening chest pain and shortness of breath. EXAM: PORTABLE  CHEST 1 VIEW COMPARISON:  Earlier same day FINDINGS: Right thoracostomy tube has been withdrawn such that at least part of the pigtail is outside of the thoracic cavity. It is possible the entire functional portion of the catheter could be outside of the thoracic cavity. There is a small pneumothorax seen at the right apex, well under 5%. Bilateral pulmonary opacity is worsening, particularly on the right, which could be due to edema or pneumonia. Port-A-Cath is unchanged. IMPRESSION: Tiny amount of pleural air remains  evident at the right apex. The right chest tube has been pulled out of the pleural space either partially or completely, I can't be certain based on this single view. Pneumothorax is no larger however. Worsening airspace density, particularly on the right, which could be due to edema or pneumonia. Electronically Signed   By: Nelson Chimes M.D.   On: 01/29/2018 16:24   Dg Chest Port 1 View  Result Date: 01/29/2018 CLINICAL DATA:  Followup right pneumothorax and chest tube. EXAM: PORTABLE CHEST 1 VIEW COMPARISON:  01/27/2018. FINDINGS: A small caliber pigtail chest tube remains on the right. The previously seen tiny apical right pneumothorax is no longer visualized. Mild patchy opacities throughout much of the right lung with improvement. Interval clearing of the opacities in the left lung. Stable enlarged cardiac silhouette. Small bilateral pleural effusions. Left subclavian porta catheter tip in the proximal superior vena cava. Right axillary surgical clips. Unremarkable bones. IMPRESSION: 1. Resolved right pneumothorax. 2. Improving right lung pneumonia or alveolar edema and resolved left lung alveolar edema. 3. Small bilateral pleural effusions. 4. Stable cardiomegaly. Electronically Signed   By: Claudie Revering M.D.   On: 01/29/2018 10:49        Scheduled Meds: . Chlorhexidine Gluconate Cloth  6 each Topical Daily  . enoxaparin (LOVENOX) injection  40 mg Subcutaneous q morning - 10a  .  furosemide  40 mg Intravenous BID  . potassium chloride  20 mEq Oral BID  . sacubitril-valsartan  1 tablet Oral BID  . sodium chloride flush  10-40 mL Intracatheter Q12H  . spironolactone  12.5 mg Oral Daily  . venlafaxine XR  75 mg Oral Q breakfast   Continuous Infusions:    LOS: 3 days    Time spent: 35 minutes   Edwin Dada, MD [facility] 01/30/2018, 1:40 PM     Pager [phone] --- please page though AMION:  [email or web address] If 7PM-7AM, please contact night-coverage

## 2018-01-30 NOTE — Progress Notes (Signed)
Inpatient Diabetes Program Recommendations  AACE/ADA: New Consensus Statement on Inpatient Glycemic Control (2015)  Target Ranges:  Prepandial:   less than 140 mg/dL      Peak postprandial:   less than 180 mg/dL (1-2 hours)      Critically ill patients:  140 - 180 mg/dL   Lab Results  Component Value Date   GLUCAP 281 (H) 01/30/2018   HGBA1C 6.9 (H) 06/24/2017   Review of Glycemic Control  Diabetes history: DM 1 Outpatient Diabetes medications: Medtronic 670-G with Novolog insulin and Dexcom sensor Current orders for Inpatient glycemic control: insulin pump order set  Inpatient Diabetes Program Recommendations:    GLucose trends increasing. Went to assess patient and insulin pump site. Patient changed her site yesterday at 1800 pm. Patient reports not covering some of her carbs from breakfast and due to not having a place to hook her pump has walked the hallways disconnected for 20 minutes prior to lunchtime check.   Patient corrected her glucose of 281 for lunch in addition to covering her carbs fully. Will wait on dinnertime glucose to see if her pump site needs to be changed. She has another set at bedside and her Daughter is bringing her Dexcom CGM sensor this evening, in which we will still use the hospital glucometer for glucose checks.  Thanks,  Tama Headings RN, MSN, BC-ADM Inpatient Diabetes Coordinator Team Pager [phone] (8a-5p)

## 2018-01-30 NOTE — Progress Notes (Addendum)
Progress Note  Patient Name: Stephanie Holden Date of Encounter: 01/30/2018  Primary Cardiologist: Glori Bickers, MD   Subjective   Breathing much improved. No further orthopnea/ PND. Was able to sleep flat last night w/o symptoms. No exertional dyspnea. LEE much improved. Only complaint is right chest wall tenderness at chest tube site (tube was removed).   Inpatient Medications    Scheduled Meds: . Chlorhexidine Gluconate Cloth  6 each Topical Daily  . furosemide  40 mg Intravenous BID  . losartan  25 mg Oral Daily  . potassium chloride  20 mEq Oral BID  . sodium chloride flush  10-40 mL Intracatheter Q12H  . spironolactone  12.5 mg Oral Daily  . venlafaxine XR  75 mg Oral Q breakfast   Continuous Infusions:  PRN Meds: acetaminophen **OR** acetaminophen, guaiFENesin-dextromethorphan, LORazepam, morphine injection, ondansetron **OR** ondansetron (ZOFRAN) IV, oxyCODONE-acetaminophen, sodium chloride flush   Vital Signs    Vitals:   01/29/18 1428 01/29/18 1621 01/29/18 2022 01/30/18 0458  BP: 111/70 106/75 110/63 122/79  Pulse: (!) 102 (!) 109 (!) 106 98  Resp: 20 (!) 22 18 18   Temp: 98.1 F (36.7 C)  98.2 F (36.8 C) 98.6 F (37 C)  TempSrc: Oral  Oral Oral  SpO2: 95% 98% 91% 96%  Weight:      Height:        Intake/Output Summary (Last 24 hours) at 01/30/2018 0749 Last data filed at 01/30/2018 0600 Gross per 24 hour  Intake 740 ml  Output 1325 ml  Net -585 ml   Filed Weights   01/27/18 1507 01/28/18 0500 01/29/18 0513  Weight: 76.2 kg 75.1 kg 76.8 kg    Telemetry    Sinus tach 109 no ventricular arrthymias - Personally Reviewed  ECG    Not performed today - Personally Reviewed  Physical Exam   GEN: Young WF in No acute distress.   Neck: No JVD Cardiac: RRR, no murmurs, rubs, or gallops.  Respiratory: faint crackles on the right lower lung field GI: Soft, nontender, non-distended  MS: No edema; No deformity. Neuro:  Nonfocal  Psych: Normal  affect   Labs    Chemistry Recent Labs  Lab 01/27/18 1619 01/28/18 0205 01/29/18 0425 01/30/18 0334  NA 140 142 139 140  K 3.8 4.7 4.2 3.8  CL 106 112* 109 103  CO2 27 22 22 29   GLUCOSE 94 157* 220* 224*  BUN 12 11 19 14   CREATININE 0.85 0.73 0.94 0.69  CALCIUM 8.8* 7.9* 8.2* 8.2*  PROT 6.5 5.5*  --   --   ALBUMIN 3.3* 2.7*  --   --   AST 23 19  --   --   ALT 21 19  --   --   ALKPHOS 65 56  --   --   BILITOT 0.8 1.3*  --   --   GFRNONAA >60 >60 >60 >60  GFRAA >60 >60 >60 >60  ANIONGAP 7 8 8 8      Hematology Recent Labs  Lab 01/28/18 0205 01/29/18 0425 01/30/18 0334  WBC 9.6 5.6 6.1  RBC 3.91 3.96 4.19  HGB 10.3* 10.4* 11.0*  HCT 34.9* 35.5* 36.3  MCV 89.3 89.6 86.6  MCH 26.3 26.3 26.3  MCHC 29.5* 29.3* 30.3  RDW 17.4* 17.5* 17.3*  PLT 358 336 348    Cardiac EnzymesNo results for input(s): TROPONINI in the last 168 hours. No results for input(s): TROPIPOC in the last 168 hours.   BNP Recent Labs  Lab 01/28/18 0737  BNP 738.3*     DDimer No results for input(s): DDIMER in the last 168 hours.   Radiology    Dg Chest Port 1 View  Result Date: 01/30/2018 CLINICAL DATA:  Chest tube removal EXAM: PORTABLE CHEST 1 VIEW COMPARISON:  01/29/2018 FINDINGS: Right thoracostomy tube has now been completely removed. No change in a miniscule amount of pleural air at the apex. Airspace densities right more than left is slightly improved. Left lung is largely clear. Power port tip in the SVC at the azygos level. IMPRESSION: Right chest tube completely removed. No change in the tiny amount of pleural air at the pleural apex. Airspace shadowing on the right is slightly improved. No worsening or new findings. Electronically Signed   By: Nelson Chimes M.D.   On: 01/30/2018 06:28   Dg Chest Port 1 View  Result Date: 01/29/2018 CLINICAL DATA:  Worsening chest pain and shortness of breath. EXAM: PORTABLE CHEST 1 VIEW COMPARISON:  Earlier same day FINDINGS: Right thoracostomy  tube has been withdrawn such that at least part of the pigtail is outside of the thoracic cavity. It is possible the entire functional portion of the catheter could be outside of the thoracic cavity. There is a small pneumothorax seen at the right apex, well under 5%. Bilateral pulmonary opacity is worsening, particularly on the right, which could be due to edema or pneumonia. Port-A-Cath is unchanged. IMPRESSION: Tiny amount of pleural air remains evident at the right apex. The right chest tube has been pulled out of the pleural space either partially or completely, I can't be certain based on this single view. Pneumothorax is no larger however. Worsening airspace density, particularly on the right, which could be due to edema or pneumonia. Electronically Signed   By: Nelson Chimes M.D.   On: 01/29/2018 16:24   Dg Chest Port 1 View  Result Date: 01/29/2018 CLINICAL DATA:  Followup right pneumothorax and chest tube. EXAM: PORTABLE CHEST 1 VIEW COMPARISON:  01/27/2018. FINDINGS: A small caliber pigtail chest tube remains on the right. The previously seen tiny apical right pneumothorax is no longer visualized. Mild patchy opacities throughout much of the right lung with improvement. Interval clearing of the opacities in the left lung. Stable enlarged cardiac silhouette. Small bilateral pleural effusions. Left subclavian porta catheter tip in the proximal superior vena cava. Right axillary surgical clips. Unremarkable bones. IMPRESSION: 1. Resolved right pneumothorax. 2. Improving right lung pneumonia or alveolar edema and resolved left lung alveolar edema. 3. Small bilateral pleural effusions. 4. Stable cardiomegaly. Electronically Signed   By: Claudie Revering M.D.   On: 01/29/2018 10:49    Cardiac Studies   2D Echo 09/19/17 Study Conclusions  - Left ventricle: The cavity size was normal. Wall thickness was normal. Systolic function was normal. The estimated ejection fraction was in the range of 60% to  65%. Wall motion was normal; there were no regional wall motion abnormalities. Doppler parameters are consistent with abnormal left ventricular relaxation (grade 1 diastolic dysfunction). - Impressions: LS 11.90 GLS 18.7%  Impressions:  - LS 11.90 GLS 18.7% __________________________________  2D Echo 01/28/18 Study Conclusions  - Procedure narrative: Transthoracic echocardiography. Image quality was poor. The study was technically difficult, as a result of poor sound wave transmission. Intravenous contrast (Definity) was administered. - Left ventricle: The cavity size was normal. Wall thickness was normal. Systolic function was severely reduced. The estimated ejection fraction was in the range of 25% to 30%. Diffuse hypokinesis. Features are consistent  with a pseudonormal left ventricular filling pattern, with concomitant abnormal relaxation and increased filling pressure (grade 2 diastolic dysfunction). - Aortic valve: There was no stenosis. - Mitral valve: Mildly calcified annulus. There was mild regurgitation. - Left atrium: The atrium was mildly dilated. - Right ventricle: The cavity size was normal. Systolic function was mildly reduced. - Pulmonary arteries: PA peak pressure: 36 mm Hg (S). - Systemic veins: IVC measured 1.8 cm with < 50% respirophasic variation, suggesting RA pressure 8 mmHg. - Pericardium, extracardiac: There is a pleural effusion. A trivial pericardial effusion was identified posterior to the heart.  Impressions:  - Normal LV size with EF 25-30%, diffuse hypokinesis. Moderate diastolic dysfunction. Normal RV size with mildly decreased systolic function. Mild MR. Marked change in EF compared to prior study.   Patient Profile     KIYAH DEMARTINI is a 42 y.o. female with a hx of breast cancer s/p bilateral mastectomies, treatment w/ chemo (adriamycin), IDDM on insulin pump and thyroid goiter, admitted for acute  hypoxic respiratory failure in the setting of pleural effusion, possible PNA and acute systolic HF with newly reduced LVEF, now 25-30%. Cardiology consulted for management of HF, at the request of Dr. Loleta Books, Internal Medicine.    Assessment & Plan   1. Cardiomyopathy: Echo shows newly reduced LVEF, down to 25-30% (previously 60-65%).  Likely etiology is adriamycin cardiotoxicity. Last chemo session was 12/09/17.  Recommend medical therapy for systolic HF as BP allows. ARB therapy was initiated yesterday, Losartan 25 mg, as well as spironolactone 12.5 mg daily. Still getting IV Lasix 40 BID. BP stable this morning at 122/79. Titrate HF meds as BP allows. If BP remains stable on ARB, can later switch to [facility].  Also consider additional of low dose ? blocker therapy once euvolemic. Given soft BP, metoprolol succinate would be a better option vs Coreg. Medication titration can be done as an outpatient. She can continue f/u in the AHF clinic post discharge.   2. Acute Systolic HF: her dyspnea and LEE both improved. Slept flat last night w/o orthopnea and PND. No exertional dyspnea. LEE resolved. Good response to IV diuretics. She will need to continue Lasix for management of pleural effusion as chest tube had to be removed last night.  Once home, will need to adopt low sodium diet and check weight daily.   3. Pleural Effusion: management per IM. Chest tube out. Treating residual with Lasix.   4. ?HCAP: now doubtful that pt had PNA to begin with. Per IM, clinical findings were never really typical for pneumonia.  WBC normal. Antibiotics have been discontinued. Her dyspnea was likely primarily 2/2  CHF and pleural effusion  5. Breast Cancer: s/p double mastectomies and completed chemo. Currently getting radiation treatments. Management per oncology.   For questions or updates, please contact [place] Please consult [email or web address] for contact info under        Signed, Lyda Jester,  PA-C  01/30/2018, 7:49 AM   As above, patient seen and examined.  Her dyspnea is improving.  Mild chest tightness with inspiration.  Plan to continue diuresis with Lasix 40 mg IV twice daily and Spironolactone 12.5 mg daily.  Follow renal function.  She will need fluid restriction to 1 L daily at home as well as low-sodium diet.  Cardiomyopathy is felt likely secondary to recent Adriamycin.  We will avoid moving forward.  Her blood pressure appears to be tolerating medications.  Discontinue losartan and treat with Entresto 24/26 twice daily.  Add  carvedilol 3.125 mg later as blood pressure allows.  Titrate medications as an outpatient.  Follow-up CHF clinic. Kirk Ruths, MD

## 2018-01-31 ENCOUNTER — Other Ambulatory Visit: Payer: Self-pay | Admitting: Hematology and Oncology

## 2018-01-31 DIAGNOSIS — I5021 Acute systolic (congestive) heart failure: Principal | ICD-10-CM

## 2018-01-31 DIAGNOSIS — I428 Other cardiomyopathies: Secondary | ICD-10-CM

## 2018-01-31 LAB — BASIC METABOLIC PANEL
ANION GAP: 9 (ref 5–15)
BUN: 13 mg/dL (ref 6–20)
CHLORIDE: 97 mmol/L — AB (ref 98–111)
CO2: 31 mmol/L (ref 22–32)
Calcium: 8.7 mg/dL — ABNORMAL LOW (ref 8.9–10.3)
Creatinine, Ser: 0.8 mg/dL (ref 0.44–1.00)
GFR calc Af Amer: >60 mL/min (ref >60)
GFR calc non Af Amer: >60 mL/min (ref >60)
Glucose, Bld: 306 mg/dL — ABNORMAL HIGH (ref 70–99)
Potassium: 3.9 mmol/L (ref 3.5–5.1)
Sodium: 137 mmol/L (ref 135–145)

## 2018-01-31 LAB — CBC
HEMATOCRIT: 41.5 % (ref 36.0–46.0)
Hemoglobin: 12.7 g/dL (ref 12.0–15.0)
MCH: 26.2 pg (ref 26.0–34.0)
MCHC: 30.6 g/dL (ref 30.0–36.0)
MCV: 85.7 fL (ref 80.0–100.0)
NRBC: 0 % (ref 0.0–0.2)
PLATELETS: 386 10*3/uL (ref 150–400)
RBC: 4.84 MIL/uL (ref 3.87–5.11)
RDW: 16.9 % — AB (ref 11.5–15.5)
WBC: 5.8 10*3/uL (ref 4.0–10.5)

## 2018-01-31 LAB — BODY FLUID CULTURE: CULTURE: NO GROWTH

## 2018-01-31 LAB — GLUCOSE, CAPILLARY
Glucose-Capillary: 217 mg/dL — ABNORMAL HIGH (ref 70–99)
Glucose-Capillary: 162 mg/dL — ABNORMAL HIGH (ref 70–99)
Glucose-Capillary: 193 mg/dL — ABNORMAL HIGH (ref 70–99)
Glucose-Capillary: 255 mg/dL — ABNORMAL HIGH (ref 70–99)

## 2018-01-31 MED ORDER — INSULIN PUMP
Freq: Three times a day (TID) | SUBCUTANEOUS | Status: DC
  Administered 2018-01-31: 5 via SUBCUTANEOUS
  Administered 2018-01-31: 4 via SUBCUTANEOUS
  Administered 2018-01-31: 1.6 via SUBCUTANEOUS
  Administered 2018-02-01: 5.7 via SUBCUTANEOUS
  Administered 2018-02-01: 1.1 via SUBCUTANEOUS
  Filled 2018-01-31: qty 1

## 2018-01-31 MED ORDER — SODIUM CHLORIDE 0.9 % IV BOLUS
250.0000 mL | Freq: Once | INTRAVENOUS | Status: AC
  Administered 2018-01-31: 250 mL via INTRAVENOUS

## 2018-01-31 NOTE — Progress Notes (Signed)
Progress Note  Patient Name: Stephanie Holden Date of Encounter: 01/31/2018  Primary Cardiologist: Dr. Glori Bickers  Subjective   Feels much better overall, significant improvement in shortness of breath.  Is able to walk in the hall, has made for laps.  Inpatient Medications    Scheduled Meds: . Chlorhexidine Gluconate Cloth  6 each Topical Daily  . enoxaparin (LOVENOX) injection  40 mg Subcutaneous q morning - 10a  . furosemide  40 mg Intravenous BID  . insulin pump   Subcutaneous TID AC, HS, 0200  . potassium chloride  20 mEq Oral BID  . sacubitril-valsartan  1 tablet Oral BID  . sodium chloride flush  10-40 mL Intracatheter Q12H  . spironolactone  12.5 mg Oral Daily  . venlafaxine XR  75 mg Oral Q breakfast    PRN Meds: acetaminophen **OR** acetaminophen, guaiFENesin-dextromethorphan, LORazepam, morphine injection, ondansetron **OR** ondansetron (ZOFRAN) IV, oxyCODONE-acetaminophen, sodium chloride flush   Vital Signs    Vitals:   01/30/18 0458 01/30/18 1418 01/30/18 2237 01/31/18 0611  BP: 122/79 108/75 120/78 116/86  Pulse: 98 (!) 107 97 79  Resp: 18 18 14 16   Temp: 98.6 F (37 C) 98.6 F (37 C) 98.1 F (36.7 C) 97.9 F (36.6 C)  TempSrc: Oral Oral Oral Oral  SpO2: 96% 95% 94%   Weight:      Height:        Intake/Output Summary (Last 24 hours) at 01/31/2018 0914 Last data filed at 01/31/2018 0600 Gross per 24 hour  Intake 1350 ml  Output -  Net 1350 ml   Filed Weights   01/27/18 1507 01/28/18 0500 01/29/18 0513  Weight: 76.2 kg 75.1 kg 76.8 kg    Telemetry    Sinus rhythm.  Personally reviewed.  Physical Exam   GEN:  Patient appears comfortable at rest.  Alopecia. Neck: No JVD. Cardiac: RRR, no murmur, rub, or gallop.  Respiratory: Nonlabored. Clear to auscultation bilaterally. GI: Soft, nontender, bowel sounds present. MS: No edema; No deformity. Neuro:  Nonfocal. Psych: Alert and oriented x 3. Normal affect.  Labs     Chemistry Recent Labs  Lab 01/27/18 1619 01/28/18 0205 01/29/18 0425 01/30/18 0334 01/31/18 0345  NA 140 142 139 140 137  K 3.8 4.7 4.2 3.8 3.9  CL 106 112* 109 103 97*  CO2 27 22 22 29 31   GLUCOSE 94 157* 220* 224* 306*  BUN 12 11 19 14 13   CREATININE 0.85 0.73 0.94 0.69 0.80  CALCIUM 8.8* 7.9* 8.2* 8.2* 8.7*  PROT 6.5 5.5*  --   --   --   ALBUMIN 3.3* 2.7*  --   --   --   AST 23 19  --   --   --   ALT 21 19  --   --   --   ALKPHOS 65 56  --   --   --   BILITOT 0.8 1.3*  --   --   --   GFRNONAA >60 >60 >60 >60 >60  GFRAA >60 >60 >60 >60 >60  ANIONGAP 7 8 8 8 9      Hematology Recent Labs  Lab 01/29/18 0425 01/30/18 0334 01/31/18 0345  WBC 5.6 6.1 5.8  RBC 3.96 4.19 4.84  HGB 10.4* 11.0* 12.7  HCT 35.5* 36.3 41.5  MCV 89.6 86.6 85.7  MCH 26.3 26.3 26.2  MCHC 29.3* 30.3 30.6  RDW 17.5* 17.3* 16.9*  PLT 336 348 386    BNP Recent Labs  Lab  01/28/18 0737  BNP 738.3*     Radiology    Dg Chest Port 1 View  Result Date: 01/30/2018 CLINICAL DATA:  Chest tube removal EXAM: PORTABLE CHEST 1 VIEW COMPARISON:  01/29/2018 FINDINGS: Right thoracostomy tube has now been completely removed. No change in a miniscule amount of pleural air at the apex. Airspace densities right more than left is slightly improved. Left lung is largely clear. Power port tip in the SVC at the azygos level. IMPRESSION: Right chest tube completely removed. No change in the tiny amount of pleural air at the pleural apex. Airspace shadowing on the right is slightly improved. No worsening or new findings. Electronically Signed   By: Nelson Chimes M.D.   On: 01/30/2018 06:28   Dg Chest Port 1 View  Result Date: 01/29/2018 CLINICAL DATA:  Worsening chest pain and shortness of breath. EXAM: PORTABLE CHEST 1 VIEW COMPARISON:  Earlier same day FINDINGS: Right thoracostomy tube has been withdrawn such that at least part of the pigtail is outside of the thoracic cavity. It is possible the entire functional  portion of the catheter could be outside of the thoracic cavity. There is a small pneumothorax seen at the right apex, well under 5%. Bilateral pulmonary opacity is worsening, particularly on the right, which could be due to edema or pneumonia. Port-A-Cath is unchanged. IMPRESSION: Tiny amount of pleural air remains evident at the right apex. The right chest tube has been pulled out of the pleural space either partially or completely, I can't be certain based on this single view. Pneumothorax is no larger however. Worsening airspace density, particularly on the right, which could be due to edema or pneumonia. Electronically Signed   By: Nelson Chimes M.D.   On: 01/29/2018 16:24   Dg Chest Port 1 View  Result Date: 01/29/2018 CLINICAL DATA:  Followup right pneumothorax and chest tube. EXAM: PORTABLE CHEST 1 VIEW COMPARISON:  01/27/2018. FINDINGS: A small caliber pigtail chest tube remains on the right. The previously seen tiny apical right pneumothorax is no longer visualized. Mild patchy opacities throughout much of the right lung with improvement. Interval clearing of the opacities in the left lung. Stable enlarged cardiac silhouette. Small bilateral pleural effusions. Left subclavian porta catheter tip in the proximal superior vena cava. Right axillary surgical clips. Unremarkable bones. IMPRESSION: 1. Resolved right pneumothorax. 2. Improving right lung pneumonia or alveolar edema and resolved left lung alveolar edema. 3. Small bilateral pleural effusions. 4. Stable cardiomegaly. Electronically Signed   By: Claudie Revering M.D.   On: 01/29/2018 10:49    Cardiac Studies   Echocardiogram 01/28/2018: Study Conclusions  - Procedure narrative: Transthoracic echocardiography. Image   quality was poor. The study was technically difficult, as a   result of poor sound wave transmission. Intravenous contrast   (Definity) was administered. - Left ventricle: The cavity size was normal. Wall thickness was    normal. Systolic function was severely reduced. The estimated   ejection fraction was in the range of 25% to 30%. Diffuse   hypokinesis. Features are consistent with a pseudonormal left   ventricular filling pattern, with concomitant abnormal relaxation   and increased filling pressure (grade 2 diastolic dysfunction). - Aortic valve: There was no stenosis. - Mitral valve: Mildly calcified annulus. There was mild   regurgitation. - Left atrium: The atrium was mildly dilated. - Right ventricle: The cavity size was normal. Systolic function   was mildly reduced. - Pulmonary arteries: PA peak pressure: 36 mm Hg (S). - Systemic  veins: IVC measured 1.8 cm with < 50% respirophasic   variation, suggesting RA pressure 8 mmHg. - Pericardium, extracardiac: There is a pleural effusion. A trivial   pericardial effusion was identified posterior to the heart.  Impressions:  - Normal LV size with EF 25-30%, diffuse hypokinesis. Moderate   diastolic dysfunction. Normal RV size with mildly decreased   systolic function. Mild MR. Marked change in EF compared to prior   study.  Patient Profile     42 y.o. female with a history of breast cancer s/p bilateral mastectomies, treatment w/ chemo (adriamycin), IDDM on insulin pump and thyroid goiter, admitted for acute hypoxic respiratory failure in the setting of pleural effusion, possible PNA and acute systolic HF with newly reduced LVEF, now 25-30%.  Assessment & Plan    1.  Cardiomyopathy, suspected nonischemic and in association with Adriamycin, LVEF 25 to 30% range.  2.  Acute systolic heart failure, clinically improved.  She is responding well to IV diuretics, anticipate change to oral regimen soon.  Also tolerating Entresto which was recently added.  3.  Pleural effusion status post drainage, chest tube removed.  Continue IV Lasix for now along with potassium supplements.  Continue Entresto and Aldactone.  Follow-up urine output and BMET in a.m.   Expect she might be able to switch to oral Lasix soon.  Next that would be addition of low-dose Coreg.  Pending on how she does, might be able to aim for discharge in the next 24 to 48 hours.  Signed, Rozann Lesches, MD  01/31/2018, 9:14 AM

## 2018-01-31 NOTE — Progress Notes (Signed)
PROGRESS NOTE    YZABELLA CRUNK  EHO:[id number] DOB: 1975-11-27 DOA: 01/27/2018 PCP: Haywood Pao, MD      Brief Narrative:  Mrs. Wilinski is a 42 y.o. F with hx BrCA s/p bilateral mastect, chemo with adriamycin and now on radiation therapy, recent admission for pleural effusion and also IDDM on insulin pump who presents with dyspnea.  In ER, CXR initially interpreted by Radiology as pneumothorax.  Chest tube placed by EDP, but no air return, only fluid.  Subsequent overread by Pulmonology confirms patient had pleural effusion only, similar to recent.  Admitted for further management of pleural effusion.      Assessment & Plan:  Acute hypoxic respiratory failure Initially thought to be from pneumothorax.  This was incorrect, the patient had a pleural effusion and pulmonary edema from new onset CHF.   Acute systolic heart failure Patient reports progressive DOE, leg swelling, orthopnea in retrospect.  On admission, BNP elevated.  Echo showed EF 25% down from 60-65% prior to adriamycin.  Orthopnea improved.  Effusion mostly gone but edema on CXR still present.  Tele normal.  Tele unremarkable. I/Os somehow not documented yesterday, but Cr, K normal.  Bicarb up, contracting?  This afternoon, BP 80/40, dizzy with standing. -Hold Lasix -Hold Entresto -Hold Arlyce Harman for now -Continue daily BMP -Daily weights, STRICT I/Os -Re-eval BP and hope for metoprolol at d/c   Pleural effusion, likely CHF related Negative for growth.  I have extremely low suspicion for infection/parapneumonic effusion.    Pneumothorax X-rays reviewed carefully with radiology.  There was no pneumothorax before chest tube.  Post-thoracostomy tube placement, there has been a tiny right apical air bleb visible to Radiology, although not to me.  This did not enlarge after removal of tube today.  Discussed informally with CT surgery today, if something like this persisted after chest tube removal, but did not *enlarge*  as this hasn't, it has extremely high likelihood to resolve on its own in 1-2 weeks.  They would typically re-image in the office in a week to ensure shrinkage or stability, then re-image as needed if new symptoms only, after that.  Doubt pneumonia Clinical findings never really typical for pneumonia.  WBC normal. Procal unreliable in chemo/cancer, but given it is undetectable and there is alternative better explanation for CXR opacities will stop empiric antibuiotics. Abx stopped, no new fever.  Diabetes Glucoses better today -Continue insulin via PUmp -CBGs TIDACHS  Other medications -Continue venlafaxine  Chemotherapy associated anemia Hgb stable, no clinical bleeding.  Hypotension BP this afternoon 80/40. Probably new Entresto, possibly over-diuresis.  Will hold diuresis, repeat Cr tomorrow, small fluid bolus.        MDM and disposition: The below labs and imaging reports reviewed and summarized above.  Medication management as above.  The patient was admitted with new onset CHF, severe exacerbation of chronic disease which is now stabilizing.  This afternoon she is probably the limit of IV diuresis and titration of Entresto, we will pause this afternoon likely home tomorrow.          DVT prophylaxis: Lovenox Code Status: FULL Family Communication: Daughter at the bedside    Consultants:   Pulmonology  Cardiology  Procedures:   Chest tube placement 11/5  Antimicrobials:   Azithromycin 11/5 >> 11/7  Cefepime 11/5 >> 11/7  Flagyl 11/5 x1     Subjective: Orthopnea and shortness of breath all resolved.  She walked 4 times around nursing station today, only limited by weakness in her  legs.  No fever, sputum, hemoptysis.  No leg swelling.     Objective: Vitals:   01/30/18 1418 01/30/18 2237 01/31/18 0611 01/31/18 1432  BP: 108/75 120/78 116/86 (!) 82/58  Pulse: (!) 107 97 79 (!) 107  Resp: '18 14 16   ' Temp: 98.6 F (37 C) 98.1 F (36.7 C) 97.9  F (36.6 C)   TempSrc: Oral Oral Oral   SpO2: 95% 94%    Weight:      Height:        Intake/Output Summary (Last 24 hours) at 01/31/2018 1435 Last data filed at 01/31/2018 1032 Gross per 24 hour  Intake 860 ml  Output 700 ml  Net 160 ml   Filed Weights   01/27/18 1507 01/28/18 0500 01/29/18 0513  Weight: 76.2 kg 75.1 kg 76.8 kg    Examination: General appearance: Adult female, sitting on edge of bed, no acute distress, interactive. HEENT: Alopecia, anicteric, conjunctive are pink, lids and lashes normal.  No nasal deformity, discharge, or epistaxis.  Lips moist, dentition normal, oropharynx without dryness or oral lesions.  Hearing normal. Skin: Warm and dry, pale without jaundice.  No suspicious rashes or lesions. Cardiac: Heart rate regular, rhythm regular, no murmurs appreciated, JVP normal, no lower extremity edema. Respiratory: Normal respiratory rate and rhythm, lungs clear without rales or wheezes. Abdomen: Abdomen soft without tenderness palpation, hepatosplenomegaly. MSK: Normal muscle bulk and tone. Neuro: Awake and alert, EOMI, muscle strength equal bilaterally. Psych: Oriented to person, place and time.  Affect and attention normal.   Data Reviewed: I have personally reviewed following labs and imaging studies:  CBC: Recent Labs  Lab 01/27/18 1619 01/28/18 0205 01/29/18 0425 01/30/18 0334 01/31/18 0345  WBC 6.7 9.6 5.6 6.1 5.8  NEUTROABS 5.1 8.7*  --   --   --   HGB 11.3* 10.3* 10.4* 11.0* 12.7  HCT 37.4 34.9* 35.5* 36.3 41.5  MCV 86.4 89.3 89.6 86.6 85.7  PLT 424* 358 336 348 290   Basic Metabolic Panel: Recent Labs  Lab 01/27/18 1619 01/28/18 0205 01/29/18 0425 01/30/18 0334 01/31/18 0345  NA 140 142 139 140 137  K 3.8 4.7 4.2 3.8 3.9  CL 106 112* 109 103 97*  CO2 '27 22 22 29 31  ' GLUCOSE 94 157* 220* 224* 306*  BUN '12 11 19 14 13  ' CREATININE 0.85 0.73 0.94 0.69 0.80  CALCIUM 8.8* 7.9* 8.2* 8.2* 8.7*   GFR: Estimated Creatinine Clearance:  88.8 mL/min (by C-G formula based on SCr of 0.8 mg/dL). Liver Function Tests: Recent Labs  Lab 01/27/18 1619 01/28/18 0205  AST 23 19  ALT 21 19  ALKPHOS 65 56  BILITOT 0.8 1.3*  PROT 6.5 5.5*  ALBUMIN 3.3* 2.7*   No results for input(s): LIPASE, AMYLASE in the last 168 hours. No results for input(s): AMMONIA in the last 168 hours. Coagulation Profile: Recent Labs  Lab 01/27/18 2310  INR 1.11   Cardiac Enzymes: No results for input(s): CKTOTAL, CKMB, CKMBINDEX, TROPONINI in the last 168 hours. BNP (last 3 results) No results for input(s): PROBNP in the last 8760 hours. HbA1C: No results for input(s): HGBA1C in the last 72 hours. CBG: Recent Labs  Lab 01/30/18 1157 01/30/18 1644 01/30/18 2235 01/31/18 0814 01/31/18 1224  GLUCAP 281* 174* 236* 217* 162*   Lipid Profile: No results for input(s): CHOL, HDL, LDLCALC, TRIG, CHOLHDL, LDLDIRECT in the last 72 hours. Thyroid Function Tests: No results for input(s): TSH, T4TOTAL, FREET4, T3FREE, THYROIDAB in the last 72  hours. Anemia Panel: No results for input(s): VITAMINB12, FOLATE, FERRITIN, TIBC, IRON, RETICCTPCT in the last 72 hours. Urine analysis:    Component Value Date/Time   COLORURINE YELLOW 12/02/2017 1030   APPEARANCEUR HAZY (A) 12/02/2017 1030   LABSPEC 1.023 12/02/2017 1030   PHURINE 5.0 12/02/2017 1030   GLUCOSEU >=500 (A) 12/02/2017 1030   HGBUR MODERATE (A) 12/02/2017 1030   BILIRUBINUR NEGATIVE 12/02/2017 1030   BILIRUBINUR n 09/06/2014 0845   KETONESUR NEGATIVE 12/02/2017 1030   PROTEINUR NEGATIVE 12/02/2017 1030   UROBILINOGEN negative 09/06/2014 0845   NITRITE NEGATIVE 12/02/2017 1030   LEUKOCYTESUR MODERATE (A) 12/02/2017 1030   Sepsis Labs: '@[email or web address]' (procalcitonin:4,lacticacidven:4)  ) Recent Results (from the past 240 hour(s))  MRSA PCR Screening     Status: None   Collection Time: 01/27/18 10:13 PM  Result Value Ref Range Status   MRSA by PCR NEGATIVE NEGATIVE Final    Comment:         The GeneXpert MRSA Assay (FDA approved for NASAL specimens only), is one component of a comprehensive MRSA colonization surveillance program. It is not intended to diagnose MRSA infection nor to guide or monitor treatment for MRSA infections. Performed at [facility], [place] [address], [place], [place] [address]   Body fluid culture     Status: None (Preliminary result)   Collection Time: 01/28/18  8:38 AM  Result Value Ref Range Status   Specimen Description   Final    PLEURAL RIGHT Performed at [facility], [place] [address] [place], [place] [address]    Special Requests   Final    Immunocompromised Performed at [facility], [place] [address], [place], [place] [address]    Gram Stain   Final    WBC PRESENT,BOTH PMN AND MONONUCLEAR NO ORGANISMS SEEN CYTOSPIN Gram Stain Report Called to,Read Back By and Verified With: Shon Hale RN '@[email or web address]'  ON 11.6.19 BY [facility] Performed at [facility], [place] [address] [place], [place] [address]    Culture   Final    NO GROWTH 3 DAYS Performed at [facility], [place] [address], [place], [place] [address]    Report Status PENDING  Incomplete         Radiology Studies: Dg Chest Port 1 View  Result Date: 01/30/2018 CLINICAL DATA:  Chest tube removal EXAM: PORTABLE CHEST 1 VIEW COMPARISON:  01/29/2018 FINDINGS: Right thoracostomy tube has now been completely removed. No change in a miniscule amount of pleural air at the apex. Airspace densities right more than left is slightly improved. Left lung is largely clear. Power port tip in the SVC at the azygos level. IMPRESSION: Right chest tube completely removed. No change in the tiny amount of pleural air at the pleural apex. Airspace shadowing on the right is slightly improved. No worsening or new findings. Electronically Signed   By: Nelson Chimes M.D.   On: 01/30/2018 06:28   Dg Chest Port 1 View  Result Date:  01/29/2018 CLINICAL DATA:  Worsening chest pain and shortness of breath. EXAM: PORTABLE CHEST 1 VIEW COMPARISON:  Earlier same day FINDINGS: Right thoracostomy tube has been withdrawn such that at least part of the pigtail is outside of the thoracic cavity. It is possible the entire functional portion of the catheter could be outside of the thoracic cavity. There is a small pneumothorax seen at the right apex, well under 5%. Bilateral pulmonary opacity is worsening, particularly on the right, which could be due to edema  or pneumonia. Port-A-Cath is unchanged. IMPRESSION: Tiny amount of pleural air remains evident at the right apex. The right chest tube has been pulled out of the pleural space either partially or completely, I can't be certain based on this single view. Pneumothorax is no larger however. Worsening airspace density, particularly on the right, which could be due to edema or pneumonia. Electronically Signed   By: Nelson Chimes M.D.   On: 01/29/2018 16:24        Scheduled Meds: . Chlorhexidine Gluconate Cloth  6 each Topical Daily  . enoxaparin (LOVENOX) injection  40 mg Subcutaneous q morning - 10a  . insulin pump   Subcutaneous TID AC, HS, 0200  . potassium chloride  20 mEq Oral BID  . sodium chloride flush  10-40 mL Intracatheter Q12H  . venlafaxine XR  75 mg Oral Q breakfast   Continuous Infusions: . sodium chloride       LOS: 4 days    Time spent: 25 minutes   Edwin Dada, MD [facility] 01/31/2018, 2:35 PM     Pager [phone] --- please page though AMION:  [email or web address] If 7PM-7AM, please contact night-coverage

## 2018-01-31 NOTE — Progress Notes (Signed)
Patient self-administered 4.6 unit bolus via Insulin pump based on CHO intake. Eulas Post, RN

## 2018-01-31 NOTE — Progress Notes (Signed)
Pt BP is 83/56 via Dinamap.  Rechecked manually and was 82/58. Pt is feeling light-headed. MD notified and orders received. Eulas Post, RN

## 2018-02-01 LAB — BASIC METABOLIC PANEL
Anion gap: 8 (ref 5–15)
BUN: 13 mg/dL (ref 6–20)
CALCIUM: 8.9 mg/dL (ref 8.9–10.3)
CO2: 30 mmol/L (ref 22–32)
CREATININE: 0.63 mg/dL (ref 0.44–1.00)
Chloride: 101 mmol/L (ref 98–111)
GFR calc non Af Amer: >60 mL/min (ref >60)
GLUCOSE: 183 mg/dL — AB (ref 70–99)
Potassium: 4 mmol/L (ref 3.5–5.1)
Sodium: 139 mmol/L (ref 135–145)

## 2018-02-01 LAB — CBC
HEMATOCRIT: 40.7 % (ref 36.0–46.0)
Hemoglobin: 12.4 g/dL (ref 12.0–15.0)
MCH: 26.1 pg (ref 26.0–34.0)
MCHC: 30.5 g/dL (ref 30.0–36.0)
MCV: 85.5 fL (ref 80.0–100.0)
Platelets: 365 10*3/uL (ref 150–400)
RBC: 4.76 MIL/uL (ref 3.87–5.11)
RDW: 16.5 % — AB (ref 11.5–15.5)
WBC: 5.8 10*3/uL (ref 4.0–10.5)
nRBC: 0 % (ref 0.0–0.2)

## 2018-02-01 LAB — GLUCOSE, CAPILLARY
GLUCOSE-CAPILLARY: 259 mg/dL — AB (ref 70–99)
Glucose-Capillary: 201 mg/dL — ABNORMAL HIGH (ref 70–99)

## 2018-02-01 MED ORDER — SACUBITRIL-VALSARTAN 24-26 MG PO TABS
1.0000 | ORAL_TABLET | Freq: Two times a day (BID) | ORAL | Status: DC
  Administered 2018-02-01: 1 via ORAL
  Filled 2018-02-01: qty 1

## 2018-02-01 MED ORDER — FUROSEMIDE 40 MG PO TABS: 40.0000 mg | ORAL_TABLET | Freq: Every day | ORAL | 11 refills | Status: DC

## 2018-02-01 MED ORDER — FUROSEMIDE 40 MG PO TABS
40.0000 mg | ORAL_TABLET | Freq: Two times a day (BID) | ORAL | Status: DC
  Administered 2018-02-01: 40 mg via ORAL
  Filled 2018-02-01: qty 1

## 2018-02-01 MED ORDER — HEPARIN SOD (PORK) LOCK FLUSH 100 UNIT/ML IV SOLN
500.0000 [IU] | INTRAVENOUS | Status: AC | PRN
  Administered 2018-02-01: 500 [IU]

## 2018-02-01 MED ORDER — SACUBITRIL-VALSARTAN 24-26 MG PO TABS: 1.0000 | ORAL_TABLET | Freq: Two times a day (BID) | ORAL | 0 refills | Status: DC

## 2018-02-01 MED ORDER — POTASSIUM CHLORIDE ER 20 MEQ PO TBCR: 10.0000 meq | EXTENDED_RELEASE_TABLET | Freq: Every day | ORAL | 3 refills | Status: DC

## 2018-02-01 NOTE — Progress Notes (Signed)
Patient discharged home with husband, discharge instructions given and explained to patient/husband, they verbalized understanding, Patient denies any pain/distress, no wound noted, skin intact, accompanied home by husband.

## 2018-02-01 NOTE — Progress Notes (Signed)
Progress Note  Patient Name: Stephanie Holden Date of Encounter: 02/01/2018   Primary Cardiologist: Dr. Glori Bickers  Subjective   Shortness of breath at rest.  She walked 6 times around the unit last night and felt well.  Did have an episode of hypotension yesterday and Delene Loll was held.  Inpatient Medications    Scheduled Meds: . Chlorhexidine Gluconate Cloth  6 each Topical Daily  . enoxaparin (LOVENOX) injection  40 mg Subcutaneous q morning - 10a  . furosemide  40 mg Oral BID  . insulin pump   Subcutaneous TID AC, HS, 0200  . potassium chloride  20 mEq Oral BID  . sacubitril-valsartan  1 tablet Oral BID  . sodium chloride flush  10-40 mL Intracatheter Q12H  . venlafaxine XR  75 mg Oral Q breakfast    PRN Meds: acetaminophen **OR** acetaminophen, guaiFENesin-dextromethorphan, LORazepam, morphine injection, ondansetron **OR** ondansetron (ZOFRAN) IV, oxyCODONE-acetaminophen, sodium chloride flush   Vital Signs    Vitals:   01/31/18 1612 01/31/18 2129 02/01/18 0515 02/01/18 0719  BP: 100/69 103/71 118/82   Pulse: (!) 101 100 (!) 105   Resp:  18 18   Temp:  98.4 F (36.9 C) 97.9 F (36.6 C)   TempSrc:  Oral Oral   SpO2: 96% 98% 97%   Weight:    70.3 kg  Height:        Intake/Output Summary (Last 24 hours) at 02/01/2018 0910 Last data filed at 02/01/2018 0606 Gross per 24 hour  Intake 1810 ml  Output 1900 ml  Net -90 ml   Filed Weights   01/28/18 0500 01/29/18 0513 02/01/18 0719  Weight: 75.1 kg 76.8 kg 70.3 kg    Telemetry    Sinus rhythm. Personally reviewed.  Physical Exam   GEN: No acute distress.  Alopecia. Neck: No JVD. Cardiac: RRR, no murmur, rub, or gallop.  Respiratory: Nonlabored. Clear to auscultation bilaterally. GI: Soft, nontender, bowel sounds present. MS: No edema; No deformity. Neuro:  Nonfocal. Psych: Alert and oriented x 3. Normal affect.  Labs    Chemistry Recent Labs  Lab 01/27/18 1619 01/28/18 0205   01/30/18 0334 01/31/18 0345 02/01/18 0355  NA 140 142   < > 140 137 139  K 3.8 4.7   < > 3.8 3.9 4.0  CL 106 112*   < > 103 97* 101  CO2 27 22   < > 29 31 30   GLUCOSE 94 157*   < > 224* 306* 183*  BUN 12 11   < > 14 13 13   CREATININE 0.85 0.73   < > 0.69 0.80 0.63  CALCIUM 8.8* 7.9*   < > 8.2* 8.7* 8.9  PROT 6.5 5.5*  --   --   --   --   ALBUMIN 3.3* 2.7*  --   --   --   --   AST 23 19  --   --   --   --   ALT 21 19  --   --   --   --   ALKPHOS 65 56  --   --   --   --   BILITOT 0.8 1.3*  --   --   --   --   GFRNONAA >60 >60   < > >60 >60 >60  GFRAA >60 >60   < > >60 >60 >60  ANIONGAP 7 8   < > 8 9 8    < > = values in this interval not displayed.  Hematology Recent Labs  Lab 01/30/18 0334 01/31/18 0345 02/01/18 0355  WBC 6.1 5.8 5.8  RBC 4.19 4.84 4.76  HGB 11.0* 12.7 12.4  HCT 36.3 41.5 40.7  MCV 86.6 85.7 85.5  MCH 26.3 26.2 26.1  MCHC 30.3 30.6 30.5  RDW 17.3* 16.9* 16.5*  PLT 348 386 365   BNP Recent Labs  Lab 01/28/18 0737  BNP 738.3*     Radiology    No results found.  Cardiac Studies   Echocardiogram 01/28/2018: Study Conclusions  - Procedure narrative: Transthoracic echocardiography. Image quality was poor. The study was technically difficult, as a result of poor sound wave transmission. Intravenous contrast (Definity) was administered. - Left ventricle: The cavity size was normal. Wall thickness was normal. Systolic function was severely reduced. The estimated ejection fraction was in the range of 25% to 30%. Diffuse hypokinesis. Features are consistent with a pseudonormal left ventricular filling pattern, with concomitant abnormal relaxation and increased filling pressure (grade 2 diastolic dysfunction). - Aortic valve: There was no stenosis. - Mitral valve: Mildly calcified annulus. There was mild regurgitation. - Left atrium: The atrium was mildly dilated. - Right ventricle: The cavity size was normal. Systolic  function was mildly reduced. - Pulmonary arteries: PA peak pressure: 36 mm Hg (S). - Systemic veins: IVC measured 1.8 cm with < 50% respirophasic variation, suggesting RA pressure 8 mmHg. - Pericardium, extracardiac: There is a pleural effusion. A trivial pericardial effusion was identified posterior to the heart.  Impressions:  - Normal LV size with EF 25-30%, diffuse hypokinesis. Moderate diastolic dysfunction. Normal RV size with mildly decreased systolic function. Mild MR. Marked change in EF compared to prior study.  Patient Profile     42 y.o. female with a history of breast cancer s/p bilateral mastectomies, treatment w/ chemo (adriamycin), IDDM on insulin pump and thyroid goiter, admitted for acute hypoxic respiratory failure in the setting of pleural effusion, possible PNA and acute systolic HF with newly reduced LVEF, now 25-30%.  Assessment & Plan    1.  Cardiomyopathy, suspected nonischemic and in association with Adriamycin, LVEF 25 to 30% range.  2.  Acute systolic heart failure, clinically improved with IV diuresis.  Did have episode of hypotension yesterday and second dose of Entresto was held.  3.  Pleural effusion status post drainage, chest tube removed.  Patient states that she feels good this morning.  Already got morning dose of Entresto and blood pressure stable so far.  I would recommend holding Lasix and potassium today.  If she ambulates and does well, could consider discharge home later this afternoon with follow-up in the Heart Failure clinic in the next 7 to 10 days.  Would plan to discharge on Lasix 40 mg once daily with KCl 20 mEq daily in addition to current dose of Entresto.  Hold off on Coreg for now, this can be added as an outpatient.  Signed, Rozann Lesches, MD  02/01/2018, 9:10 AM

## 2018-02-01 NOTE — Discharge Summary (Signed)
Physician Discharge Summary  Stephanie Holden MGQ:[id number] DOB: [birth year] DOA: 01/27/2018  PCP: Haywood Pao, MD  Admit date: 01/27/2018 Discharge date: 02/01/2018  Admitted From: Home  Disposition:  Home   Recommendations for Outpatient Follow-up:  1. Follow up with PCP in 1 week 2. Please obtain CXR in 1 week 3. Please obtain BMP and CBC in 1 week 4. Follow up with Heart Failure clinic in 2-4 weeks 5. Follow up with Oncology clinic as previously planned    Home Health: None  Equipment/Devices: None  Discharge Condition: Good  CODE STATUS: FULL Diet recommendation: Low sodium  Brief/Interim Summary: Stephanie Holden is a 42 y.o. F with hx BrCA s/p bilateral mastect, chemo with adriamycin and now on radiation therapy, recent admission for pleural effusion and also IDDM on insulin pump who presents with dyspnea.  Had CXR initially interpreted by Radiology as pneumothorax.  Sent to ER for chest tube, but no air return, only fluid.  Subsequent overread by Pulmonology confirms patient had pleural effusion only, similar to recent.  Admitted for further management of pleural effusion.     PRINCIPAL HOSPITAL DIAGNOSIS: Acute systolic CHF    Discharge Diagnoses:   Acute hypoxic respiratory failure Initially thought to be from pneumothorax.  This was incorrect, the patient had a pleural effusion and pulmonary edema from new onset CHF.   Acute systolic heart failure Patient reports progressive DOE, leg swelling, orthopnea in retrospect.  On admission, BNP elevated.  Echo showed EF 25% down from 60-65% prior to adriamycin. Started on IV Lasix, orthopnea and DOE resolved.  Evaluated by Cardiology and started on Entresto.  Due to symptomatic hypotension after starting Entresto, spironolactone and metoprolol were held at discharge. Defer initiation to HF clinic.  Discharged on furosemide 40 mg once daily, K suppl, Entresto 24-26.   Pleural effusion, likely CHF related Negative for  growth.  Negative for malignancy.  Protein undetectable; clinically suspect CHF transudate.  Pneumothorax X-rays reviewed carefully with radiology.  There was no pneumothorax before chest tube.  Post-thoracostomy tube placement, there has been a tiny right apical air bleb visible.  On hospital day 2, the patient's chest tube migrated (probably due to dislodgement when patient moved), and had to be removed.  After chest tube removal, the very small right apical PTX did not enlarge.  Discussed with CT surgery by phone.  This has high likelihood to resolve on its own in 1-2 weeks.  They would typically re-image in the office in a week to ensure shrinkage or stability, then re-image as needed only if new symptoms after that.  Diabetes  Chemotherapy associated anemia Hgb stable, no clinical bleeding.          Discharge Instructions  Discharge Instructions    Diet - low sodium heart healthy   Complete by:  As directed    Discharge instructions   Complete by:  As directed    From Dr. Loleta Books: You were admitted for trouble breathing and a pleural effusion, that was ultimately found to be from an episode of congestive heart failure.  You were treated with IV furosemide/Lasix for "diuresis" (draining the water off). You were started on a new medicine, Entresto. Continue Lasix 40 mg once daily Take potassium when you take Lasix  Take Entresto 24/26 twice daily.  If you haven't heard from the Heart Failure clinic by Tuesday, call them at: [phone]  At home, try to adhere to a low sodium (less than 2gm sodium) per day diet. Limit fluid intake  to 2 liters per day at the most.  Most importantly, weigh yourself when you get home today.  That is your "dry weight". Then, start weighing yourself every day in the morning, in your pajamas, when you first wake up. This is how you will know if you are gaining water weight or not.  If your weight ever goes up by more than 3 lbs in a single  day or EVER 5 lbs over your dry weight, call the heart failure clinic for instructions.   Lastly, follow up with your primary care doctor, Dr. Osborne Casco, in 1 week to have labs drawn and have a repeat chest x-ray.   In the meantime, if you have any new sudden chest pain or trouble breathing that lasts long enough that you are worried about it, seek care at Dr. Loren Racer office, an urgent care or the ER.   Increase activity slowly   Complete by:  As directed      Allergies as of 02/01/2018      Reactions   Dilaudid [hydromorphone Hcl] Itching, Other (See Comments)   UNCONTROLLABLE CRYING      Medication List    STOP taking these medications   meloxicam 7.5 MG tablet Commonly known as:  MOBIC     TAKE these medications   albuterol 108 (90 Base) MCG/ACT inhaler Commonly known as:  PROVENTIL HFA;VENTOLIN HFA Inhale 1-2 puffs into the lungs every 6 (six) hours as needed for wheezing or shortness of breath.   BREO ELLIPTA 200-25 MCG/INH Aepb Generic drug:  fluticasone furoate-vilanterol Inhale 1 puff into the lungs daily.   furosemide 40 MG tablet Commonly known as:  LASIX Take 1 tablet (40 mg total) by mouth daily.   guaiFENesin-dextromethorphan 100-10 MG/5ML syrup Commonly known as:  ROBITUSSIN DM Take 5 mLs by mouth every 4 (four) hours as needed for cough.   hyaluronate sodium Gel Apply 1 application topically once.   lidocaine-prilocaine cream Commonly known as:  EMLA Apply to affected area once   LORazepam 0.5 MG tablet Commonly known as:  ATIVAN TAKE 1 TABLET BY MOUTH EVERY 6 HOURS AS NEEDED FOR ANXIETY OR SLEEP What changed:  See the new instructions.   NOVOLOG 100 UNIT/ML injection Generic drug:  insulin aspart Inject 45 Units into the skin See admin instructions. Use via insulin pump up to 45 units daily   ondansetron 8 MG tablet Commonly known as:  ZOFRAN Take 1 tablet (8 mg total) by mouth 2 (two) times daily as needed. Start on the third day after  chemotherapy.   oxyCODONE-acetaminophen 5-325 MG tablet Commonly known as:  PERCOCET/ROXICET Take 1 tablet by mouth every 8 (eight) hours as needed for severe pain.   Potassium Chloride ER 20 MEQ Tbcr Take 10 mEq by mouth daily.   PROBIOTIC DAILY PO Take 1 capsule by mouth at bedtime.   sacubitril-valsartan 24-26 MG Commonly known as:  ENTRESTO Take 1 tablet by mouth 2 (two) times daily.   venlafaxine XR 75 MG 24 hr capsule Commonly known as:  EFFEXOR-XR Take 1 capsule (75 mg total) by mouth daily with breakfast. What changed:  when to take this       Allergies  Allergen Reactions  . Dilaudid [Hydromorphone Hcl] Itching and Other (See Comments)    UNCONTROLLABLE CRYING    Consultations:  Cardiology   Procedures/Studies: Dg Chest 1 View  Result Date: 01/13/2018 CLINICAL DATA:  Breast cancer.  Followup right thoracentesis. EXAM: CHEST  1 VIEW COMPARISON:  Chest CT 01/12/2018.  FINDINGS: No visible pleural fluid on either side on this AP projection. No pneumothorax. Bilateral airspace filling worse on the right than the left consistent with edema or pneumonia. Power port unchanged with its tip in the SVC. IMPRESSION: No visible pleural effusions. No pneumothorax. Persistent bilateral alveolar filling shadows right more than left. Electronically Signed   By: Nelson Chimes M.D.   On: 01/13/2018 16:54   Dg Chest 2 View  Result Date: 01/27/2018 CLINICAL DATA:  Recent pneumonia and pleural effusions EXAM: CHEST - 2 VIEW COMPARISON:  January 14, 2018 FINDINGS: The heart size and mediastinal contours are stable. Left central venous line is unchanged. Bilateral pneumonias are identified, right worse than left. There is a right pleural effusion. There is interval developed moderate right pneumothorax. The visualized skeletal structures are stable. IMPRESSION: Moderate right pneumothorax noted. Bilateral pneumonias, right worse than left. Right pleural effusion. These results were called  by telephone at the time of interpretation on 01/27/2018 at 2:46 pm to Elby Showers RN from Dr. Domenick Gong office, Who verbally acknowledged these results. Mr Ronnald Ramp stated she will call [facility] and give further direction to the patient. Electronically Signed   By: Abelardo Diesel M.D.   On: 01/27/2018 14:48   Dg Chest 2 View  Result Date: 01/14/2018 CLINICAL DATA:  Shortness of breath EXAM: CHEST - 2 VIEW COMPARISON:  01/13/2018, 10/21/2016, CT chest 01/12/2018 FINDINGS: Left-sided central venous port tip over the SVC. Small bilateral pleural effusions. Stable cardiomediastinal silhouette. Similar appearance of right greater than left consolidations and ground-glass opacity. Clips in the right axilla. IMPRESSION: 1. Trace bilateral pleural effusions. 2. No significant interval change than right greater than left consolidations and ground-glass densities. Electronically Signed   By: Donavan Foil M.D.   On: 01/14/2018 15:51   Ct Angio Chest Pe W Or Wo Contrast  Result Date: 01/12/2018 CLINICAL DATA:  Shortness of breath, heavy chest pain. Recent diagnosis of pneumonia, on antibiotics. History of RIGHT breast cancer in 2019 status post bilateral mastectomies. Current radiation therapy. EXAM: CT ANGIOGRAPHY CHEST WITH CONTRAST TECHNIQUE: Multidetector CT imaging of the chest was performed using the standard protocol during bolus administration of intravenous contrast. Multiplanar CT image reconstructions and MIPs were obtained to evaluate the vascular anatomy. CONTRAST:  66m ISOVUE-370 IOPAMIDOL (ISOVUE-370) INJECTION 76% COMPARISON:  Chest CT dated 05/21/2017. FINDINGS: Cardiovascular: There is no pulmonary embolism identified within the main, lobar or segmental pulmonary arteries bilaterally. No thoracic aortic aneurysm or evidence of aortic dissection. Heart size is normal. No pericardial effusion. Mediastinum/Nodes: No mass or enlarged lymph nodes seen within the mediastinum or  perihilar regions. Esophagus is unremarkable. Trachea and central bronchi are unremarkable. Lungs/Pleura: Patchy consolidations within both lungs, involving all lobes, RIGHT greater than LEFT, some components with ground-glass opacities. RIGHT pleural effusion, moderate to large in size. LEFT pleural effusion, moderate in size. Upper Abdomen: Limited images of the upper abdomen are unremarkable. Musculoskeletal: No acute or suspicious osseous finding. Review of the MIP images confirms the above findings. IMPRESSION: 1. No pulmonary embolism. 2. Patchy consolidations throughout both lungs, involving all lobes, RIGHT greater than LEFT, some components with ground glass opacities. Differential includes atypical pneumonias such as viral or fungal, interstitial pneumonias, edema related to volume overload/CHF, chronic interstitial diseases, hypersensitivity pneumonitis, and respiratory bronchiolitis. Given the history of current radiation, findings could also represent radiation induced pneumonitis or a combination of pneumonia and radiation pneumonitis. 3. RIGHT pleural effusion, moderate to large in size. 4. LEFT pleural effusion,  moderate in size. Electronically Signed   By: Franki Cabot M.D.   On: 01/12/2018 14:13   Dg Chest Port 1 View  Result Date: 01/30/2018 CLINICAL DATA:  Chest tube removal EXAM: PORTABLE CHEST 1 VIEW COMPARISON:  01/29/2018 FINDINGS: Right thoracostomy tube has now been completely removed. No change in a miniscule amount of pleural air at the apex. Airspace densities right more than left is slightly improved. Left lung is largely clear. Power port tip in the SVC at the azygos level. IMPRESSION: Right chest tube completely removed. No change in the tiny amount of pleural air at the pleural apex. Airspace shadowing on the right is slightly improved. No worsening or new findings. Electronically Signed   By: Nelson Chimes M.D.   On: 01/30/2018 06:28   Dg Chest Port 1 View  Result Date:  01/29/2018 CLINICAL DATA:  Worsening chest pain and shortness of breath. EXAM: PORTABLE CHEST 1 VIEW COMPARISON:  Earlier same day FINDINGS: Right thoracostomy tube has been withdrawn such that at least part of the pigtail is outside of the thoracic cavity. It is possible the entire functional portion of the catheter could be outside of the thoracic cavity. There is a small pneumothorax seen at the right apex, well under 5%. Bilateral pulmonary opacity is worsening, particularly on the right, which could be due to edema or pneumonia. Port-A-Cath is unchanged. IMPRESSION: Tiny amount of pleural air remains evident at the right apex. The right chest tube has been pulled out of the pleural space either partially or completely, I can't be certain based on this single view. Pneumothorax is no larger however. Worsening airspace density, particularly on the right, which could be due to edema or pneumonia. Electronically Signed   By: Nelson Chimes M.D.   On: 01/29/2018 16:24   Dg Chest Port 1 View  Result Date: 01/29/2018 CLINICAL DATA:  Followup right pneumothorax and chest tube. EXAM: PORTABLE CHEST 1 VIEW COMPARISON:  01/27/2018. FINDINGS: A small caliber pigtail chest tube remains on the right. The previously seen tiny apical right pneumothorax is no longer visualized. Mild patchy opacities throughout much of the right lung with improvement. Interval clearing of the opacities in the left lung. Stable enlarged cardiac silhouette. Small bilateral pleural effusions. Left subclavian porta catheter tip in the proximal superior vena cava. Right axillary surgical clips. Unremarkable bones. IMPRESSION: 1. Resolved right pneumothorax. 2. Improving right lung pneumonia or alveolar edema and resolved left lung alveolar edema. 3. Small bilateral pleural effusions. 4. Stable cardiomegaly. Electronically Signed   By: Claudie Revering M.D.   On: 01/29/2018 10:49   Dg Chest Portable 1 View  Result Date: 01/27/2018 CLINICAL DATA:   Initial evaluation for chest tube placement. EXAM: PORTABLE CHEST 1 VIEW COMPARISON:  Prior radiograph from earlier the same day. FINDINGS: Cardiomegaly, stable. Mediastinal silhouette within normal limits. Left-sided Port-A-Cath remains in place. Interval placement of pigtail right-sided chest tube with tip overlying the mid lower right lung. There is a trace apical pneumothorax at the right lung apex, pleural edge at the third intercostal space. Patchy bibasilar opacities, right greater than left again seen, stable from previous. No appreciable pleural effusion. Underlying pulmonary vascular congestion without overt pulmonary edema. Osseous structures unchanged. IMPRESSION: 1. Interval placement of pigtail right-sided chest tube with tip overlying the mid right lower lobe. Tiny right apical pneumothorax as above. 2. Otherwise no significant interval change in multifocal bilateral airspace opacities, right greater than left. Electronically Signed   By: Pincus Badder.D.  On: 01/27/2018 20:57   US Thoracentesis Asp Pleural Space W/img Guide  Result Date: 01/13/2018 INDICATION: Patient with history of breast cancer, dyspnea, bilateral pleural effusions right greater than left. Request received for diagnostic and therapeutic right thoracentesis EXAM: ULTRASOUND GUIDED DIAGNOSTIC AND THERAPEUTIC RIGHT THORACENTESIS MEDICATIONS: None COMPLICATIONS: None immediate. PROCEDURE: An ultrasound guided thoracentesis was thoroughly discussed with the patient and questions answered. The benefits, risks, alternatives and complications were also discussed. The patient understands and wishes to proceed with the procedure. Written consent was obtained. Ultrasound was performed to localize and mark an adequate pocket of fluid in the right chest. The area was then prepped and draped in the normal sterile fashion. 1% Lidocaine was used for local anesthesia. Under ultrasound guidance a 6 Fr Safe-T-Centesis catheter was  introduced. Thoracentesis was performed. The catheter was removed and a dressing applied. FINDINGS: A total of approximately 930 cc of slightly hazy, yellow fluid was removed. Samples were sent to the laboratory as requested by the clinical team. IMPRESSION: Successful ultrasound guided diagnostic and therapeutic right thoracentesis yielding 930 cc of pleural fluid. Read by: Rowe Robert, PA-C Electronically Signed   By: Jerilynn Mages.  Shick M.D.   On: 01/13/2018 16:29       Subjective: No orthopnea, no dyspnea on exertion, no leg swelling, no chest pain, no cough, no dizziness with standing.  Discharge Exam: Vitals:   02/01/18 0515 02/01/18 1152  BP: 118/82 104/68  Pulse: (!) 105 94  Resp: 18   Temp: 97.9 F (36.6 C) 98 F (36.7 C)  SpO2: 97% 98%   Vitals:   01/31/18 2129 02/01/18 0515 02/01/18 0719 02/01/18 1152  BP: 103/71 118/82  104/68  Pulse: 100 (!) 105  94  Resp: 18 18    Temp: 98.4 F (36.9 C) 97.9 F (36.6 C)  98 F (36.7 C)  TempSrc: Oral Oral  Oral  SpO2: 98% 97%  98%  Weight:   70.3 kg   Height:        General: Pt is alert, awake, not in acute distress Cardiovascular: RRR, nl S1-S2, no murmurs appreciated.   No LE edema.   Respiratory: Normal respiratory rate and rhythm.  CTAB without rales or wheezes. Abdominal: Abdomen soft and non-tender.  No distension or HSM.   Neuro/Psych: Strength symmetric in upper and lower extremities.  Judgment and insight appear normal.   The results of significant diagnostics from this hospitalization (including imaging, microbiology, ancillary and laboratory) are listed below for reference.     Microbiology: Recent Results (from the past 240 hour(s))  MRSA PCR Screening     Status: None   Collection Time: 01/27/18 10:13 PM  Result Value Ref Range Status   MRSA by PCR NEGATIVE NEGATIVE Final    Comment:        The GeneXpert MRSA Assay (FDA approved for NASAL specimens only), is one component of a comprehensive MRSA  colonization surveillance program. It is not intended to diagnose MRSA infection nor to guide or monitor treatment for MRSA infections. Performed at [facility], [place] [address] [place], [place] [address]   Body fluid culture     Status: None   Collection Time: 01/28/18  8:38 AM  Result Value Ref Range Status   Specimen Description   Final    PLEURAL RIGHT Performed at [facility], [place] [address], [place], [place] [address]    Special Requests   Final    Immunocompromised Performed at [facility] [place]  [address] [place], [place] [address]    Gram Stain   Final    WBC PRESENT,BOTH PMN AND MONONUCLEAR NO ORGANISMS SEEN CYTOSPIN Gram Stain Report Called to,Read Back By and Verified With: Shon Hale RN '@[email or web address]'  ON 11.6.19 BY [facility] Performed at [facility], [place] [address] [place], [place] [address]    Culture   Final    NO GROWTH 3 DAYS Performed at [facility], [place] [address], [place], [place] [address]    Report Status 01/31/2018 FINAL  Final     Labs: BNP (last 3 results) Recent Labs    01/28/18 0737  BNP 564.3*   Basic Metabolic Panel: Recent Labs  Lab 01/28/18 0205 01/29/18 0425 01/30/18 0334 01/31/18 0345 02/01/18 0355  NA 142 139 140 137 139  K 4.7 4.2 3.8 3.9 4.0  CL 112* 109 103 97* 101  CO2 '22 22 29 31 30  ' GLUCOSE 157* 220* 224* 306* 183*  BUN '11 19 14 13 13  ' CREATININE 0.73 0.94 0.69 0.80 0.63  CALCIUM 7.9* 8.2* 8.2* 8.7* 8.9   Liver Function Tests: Recent Labs  Lab 01/27/18 1619 01/28/18 0205  AST 23 19  ALT 21 19  ALKPHOS 65 56  BILITOT 0.8 1.3*  PROT 6.5 5.5*  ALBUMIN 3.3* 2.7*   No results for input(s): LIPASE, AMYLASE in the last 168 hours. No results for input(s): AMMONIA in the last 168 hours. CBC: Recent Labs  Lab 01/27/18 1619 01/28/18 0205 01/29/18 0425 01/30/18 0334 01/31/18 0345 02/01/18 0355  WBC 6.7 9.6 5.6 6.1 5.8 5.8  NEUTROABS 5.1 8.7*  --    --   --   --   HGB 11.3* 10.3* 10.4* 11.0* 12.7 12.4  HCT 37.4 34.9* 35.5* 36.3 41.5 40.7  MCV 86.4 89.3 89.6 86.6 85.7 85.5  PLT 424* 358 336 348 386 365   Cardiac Enzymes: No results for input(s): CKTOTAL, CKMB, CKMBINDEX, TROPONINI in the last 168 hours. BNP: Invalid input(s): POCBNP CBG: Recent Labs  Lab 01/31/18 1224 01/31/18 1717 01/31/18 2132 02/01/18 0743 02/01/18 1154  GLUCAP 162* 193* 255* 201* 259*   D-Dimer No results for input(s): DDIMER in the last 72 hours. Hgb A1c No results for input(s): HGBA1C in the last 72 hours. Lipid Profile No results for input(s): CHOL, HDL, LDLCALC, TRIG, CHOLHDL, LDLDIRECT in the last 72 hours. Thyroid function studies No results for input(s): TSH, T4TOTAL, T3FREE, THYROIDAB in the last 72 hours.  Invalid input(s): FREET3 Anemia work up No results for input(s): VITAMINB12, FOLATE, FERRITIN, TIBC, IRON, RETICCTPCT in the last 72 hours. Urinalysis    Component Value Date/Time   COLORURINE YELLOW 12/02/2017 1030   APPEARANCEUR HAZY (A) 12/02/2017 1030   LABSPEC 1.023 12/02/2017 1030   PHURINE 5.0 12/02/2017 1030   GLUCOSEU >=500 (A) 12/02/2017 1030   HGBUR MODERATE (A) 12/02/2017 1030   BILIRUBINUR NEGATIVE 12/02/2017 1030   BILIRUBINUR n 09/06/2014 0845   KETONESUR NEGATIVE 12/02/2017 1030   PROTEINUR NEGATIVE 12/02/2017 1030   UROBILINOGEN negative 09/06/2014 0845   NITRITE NEGATIVE 12/02/2017 1030   LEUKOCYTESUR MODERATE (A) 12/02/2017 1030   Sepsis Labs Invalid input(s): PROCALCITONIN,  WBC,  LACTICIDVEN Microbiology Recent Results (from the past 240 hour(s))  MRSA PCR Screening     Status: None   Collection Time: 01/27/18 10:13 PM  Result Value Ref Range Status   MRSA by PCR NEGATIVE NEGATIVE Final    Comment:        The GeneXpert MRSA Assay (FDA approved for  NASAL specimens only), is one component of a comprehensive MRSA colonization surveillance program. It is not intended to diagnose MRSA infection nor to  guide or monitor treatment for MRSA infections. Performed at [facility], [place] [address], [place], [place] [address]   Body fluid culture     Status: None   Collection Time: 01/28/18  8:38 AM  Result Value Ref Range Status   Specimen Description   Final    PLEURAL RIGHT Performed at [facility], [place] [address] [place], [place] [address]    Special Requests   Final    Immunocompromised Performed at [facility], [place] [address], [place], [place] [address]    Gram Stain   Final    WBC PRESENT,BOTH PMN AND MONONUCLEAR NO ORGANISMS SEEN CYTOSPIN Gram Stain Report Called to,Read Back By and Verified With: Shon Hale RN '@1013'  ON 11.6.19 BY [facility] Performed at [facility], [place] [address], [place], [place] [address]    Culture   Final    NO GROWTH 3 DAYS Performed at [facility], [place] [address] [place], [place] [address]    Report Status 01/31/2018 FINAL  Final     Time coordinating discharge: 40 minutes       SIGNED:   Edwin Dada, MD  [facility] 02/01/2018, 5:06 PM

## 2018-02-01 NOTE — Care Management Note (Addendum)
Case Management Note  Patient Details  Name: Stephanie LAVERDURE MRN: [id number] Date of Birth: [birth year]  Subjective/Objective:    42 year old admitted with acute hypoxic resp failure, Pleural Effusion, CHF, Pneumothorax                Action/Plan: Started on Entresto. Provided pt with Entresto 30 day free trial card and $10 copay card. Will check pt's pharmacy for availability.  [facility]   [facility]   Expected Discharge Date:                 Expected Discharge Plan:  Home/Self Care  In-House Referral:  NA  Discharge planning Services  CM Consult, Medication Assistance  Post Acute Care Choice:  NA Choice offered to:  NA  DME Arranged:  N/A DME Agency:  NA  HH Arranged:  NA HH Agency:  NA  Status of Service:  Completed, signed off  If discussed at [place] of Stay Meetings, dates discussed:    Additional Comments:  Erenest Rasher, RN 02/01/2018, 9:06 AM

## 2018-02-02 ENCOUNTER — Ambulatory Visit
Admission: RE | Admit: 2018-02-02 | Discharge: 2018-02-02 | Disposition: A | Discharge status: Home / Self Care | Payer: BLUE CROSS/BLUE SHIELD | Source: Ambulatory Visit | Attending: Radiation Oncology | Admitting: Radiation Oncology

## 2018-02-02 ENCOUNTER — Other Ambulatory Visit: Payer: Self-pay

## 2018-02-02 DIAGNOSIS — Z170 Estrogen receptor positive status [ER+]: Secondary | ICD-10-CM | POA: Diagnosis not present

## 2018-02-02 DIAGNOSIS — C50611 Malignant neoplasm of axillary tail of right female breast: Secondary | ICD-10-CM | POA: Diagnosis not present

## 2018-02-02 DIAGNOSIS — Z510 Encounter for antineoplastic radiation therapy: Secondary | ICD-10-CM | POA: Diagnosis not present

## 2018-02-02 MED ORDER — OXYCODONE-ACETAMINOPHEN 5-325 MG PO TABS: 1.0000 | ORAL_TABLET | Freq: Three times a day (TID) | ORAL | 0 refills | Status: DC | PRN

## 2018-02-02 NOTE — Telephone Encounter (Signed)
Pt called for a refill of her pain medication. Pt will be in tomorrow morning to pick it up, after radiation tx.

## 2018-02-03 ENCOUNTER — Ambulatory Visit: Payer: BLUE CROSS/BLUE SHIELD

## 2018-02-03 ENCOUNTER — Ambulatory Visit
Admission: RE | Admit: 2018-02-03 | Discharge: 2018-02-03 | Disposition: A | Discharge status: Home / Self Care | Payer: BLUE CROSS/BLUE SHIELD | Source: Ambulatory Visit | Attending: Radiation Oncology | Admitting: Radiation Oncology

## 2018-02-03 ENCOUNTER — Inpatient Hospital Stay (HOSPITAL_COMMUNITY)
Admission: RE | Admit: 2018-02-03 | Discharge: 2018-02-03 | Disposition: A | Discharge status: Home / Self Care | Payer: BLUE CROSS/BLUE SHIELD | Source: Ambulatory Visit

## 2018-02-03 DIAGNOSIS — C50611 Malignant neoplasm of axillary tail of right female breast: Secondary | ICD-10-CM | POA: Diagnosis not present

## 2018-02-03 DIAGNOSIS — I5020 Unspecified systolic (congestive) heart failure: Secondary | ICD-10-CM | POA: Diagnosis not present

## 2018-02-03 DIAGNOSIS — Z510 Encounter for antineoplastic radiation therapy: Secondary | ICD-10-CM | POA: Diagnosis not present

## 2018-02-03 DIAGNOSIS — J90 Pleural effusion, not elsewhere classified: Secondary | ICD-10-CM | POA: Diagnosis not present

## 2018-02-03 DIAGNOSIS — R0609 Other forms of dyspnea: Secondary | ICD-10-CM | POA: Diagnosis not present

## 2018-02-03 DIAGNOSIS — E10319 Type 1 diabetes mellitus with unspecified diabetic retinopathy without macular edema: Secondary | ICD-10-CM | POA: Diagnosis not present

## 2018-02-03 DIAGNOSIS — Z170 Estrogen receptor positive status [ER+]: Secondary | ICD-10-CM | POA: Diagnosis not present

## 2018-02-04 ENCOUNTER — Ambulatory Visit: Payer: BLUE CROSS/BLUE SHIELD

## 2018-02-04 ENCOUNTER — Ambulatory Visit
Admission: RE | Admit: 2018-02-04 | Discharge: 2018-02-04 | Disposition: A | Discharge status: Home / Self Care | Payer: BLUE CROSS/BLUE SHIELD | Source: Ambulatory Visit | Attending: Radiation Oncology | Admitting: Radiation Oncology

## 2018-02-04 DIAGNOSIS — Z510 Encounter for antineoplastic radiation therapy: Secondary | ICD-10-CM | POA: Diagnosis not present

## 2018-02-04 DIAGNOSIS — C50611 Malignant neoplasm of axillary tail of right female breast: Secondary | ICD-10-CM | POA: Diagnosis not present

## 2018-02-04 DIAGNOSIS — Z170 Estrogen receptor positive status [ER+]: Secondary | ICD-10-CM | POA: Diagnosis not present

## 2018-02-05 ENCOUNTER — Ambulatory Visit
Admission: RE | Admit: 2018-02-05 | Discharge: 2018-02-05 | Disposition: A | Discharge status: Home / Self Care | Payer: BLUE CROSS/BLUE SHIELD | Source: Ambulatory Visit | Attending: Radiation Oncology | Admitting: Radiation Oncology

## 2018-02-05 ENCOUNTER — Ambulatory Visit: Payer: BLUE CROSS/BLUE SHIELD

## 2018-02-05 DIAGNOSIS — I972 Postmastectomy lymphedema syndrome: Secondary | ICD-10-CM | POA: Diagnosis not present

## 2018-02-05 DIAGNOSIS — Z483 Aftercare following surgery for neoplasm: Secondary | ICD-10-CM | POA: Diagnosis not present

## 2018-02-05 DIAGNOSIS — Z510 Encounter for antineoplastic radiation therapy: Secondary | ICD-10-CM | POA: Diagnosis not present

## 2018-02-05 DIAGNOSIS — Z170 Estrogen receptor positive status [ER+]: Secondary | ICD-10-CM | POA: Diagnosis not present

## 2018-02-05 DIAGNOSIS — C50611 Malignant neoplasm of axillary tail of right female breast: Secondary | ICD-10-CM | POA: Diagnosis not present

## 2018-02-05 DIAGNOSIS — R293 Abnormal posture: Secondary | ICD-10-CM

## 2018-02-05 DIAGNOSIS — M6281 Muscle weakness (generalized): Secondary | ICD-10-CM | POA: Diagnosis not present

## 2018-02-05 NOTE — Therapy (Signed)
[place], [place], [address] Phone: [phone]   Fax:  [phone]  Physical Therapy Treatment  Patient Details  Name: Stephanie Holden MRN: [id number] Date of Birth: [birth year] Referring Provider (PT): Dr. Eppie Gibson   Encounter Date: 02/05/2018  PT End of Session - 02/05/18 1012    Visit Number  5    Number of Visits  12    Date for PT Re-Evaluation  03/05/18    Authorization Type  Patient only has 30 PT visits per year and as of 01/01/18, she had already used 18.    PT Start Time  [id number]    PT Stop Time  1019    PT Time Calculation (min)  54 min    Activity Tolerance  Patient tolerated treatment well    Behavior During Therapy  WFL for tasks assessed/performed       Past Medical History:  Diagnosis Date  . Cancer [facility])    Breast cancer - right  . Constipation   . Depression   . Genetic testing 05/27/2017   Breast/GYN panel (23 genes) @ [facility] - No pathogenic mutations detected  . GERD (gastroesophageal reflux disease)   . Insulin dependent type 1 diabetes mellitus ([place])   . Insulin pump in place   . PONV (postoperative nausea and vomiting)   . Stenosing tenosynovitis of thumb 06/2014   right  . Thyroid goiter     Past Surgical History:  Procedure Laterality Date  . AXILLARY LYMPH NODE DISSECTION Right 06/26/2017   Procedure: AXILLARY LYMPH NODE DISSECTION;  Surgeon: [name], MD;  Location: [place];  Service: General;  Laterality: Right;  . CARPAL TUNNEL RELEASE Bilateral 10/28/2013   Procedure: BILATERAL CARPAL TUNNEL RELEASE;  Surgeon: Daryll Brod, MD;  Location: [place];  Service: Orthopedics;  Laterality: Bilateral;  . CESAREAN SECTION  2000; 08/20/2001  . ESSURE TUBAL LIGATION  2005   failed, tubal puncture  . LAPAROSCOPIC TUBAL LIGATION  04/02/2007   removal Essure  . LAPAROSCOPIC UNILATERAL SALPINGECTOMY  2005  . MASTECTOMY W/ SENTINEL NODE BIOPSY Bilateral  06/05/2017   Procedure: BILATERAL MASTECTOMIES  WITH RIGHT SENTINEL LYMPH NODE BIOPSY;  Surgeon: [name], MD;  Location: [place];  Service: General;  Laterality: Bilateral;  . PORTACATH PLACEMENT Left 06/26/2017   Procedure: INSERTION PORT-A-CATH;  Surgeon: [name], MD;  Location: [place];  Service: General;  Laterality: Left;  . TRIGGER FINGER RELEASE Left 04/12/2014   Procedure: RELEASE A-1 PULLEY LEFT THUMB;  Surgeon: Daryll Brod, MD;  Location: [place];  Service: Orthopedics;  Laterality: Left;  . TRIGGER FINGER RELEASE Right 07/19/2014   Procedure: RELEASE TRIGGER FINGER/A-1 PULLEY RIGHT THUMB;  Surgeon: Daryll Brod, MD;  Location: [place];  Service: Orthopedics;  Laterality: Right;    There were no vitals filed for this visit.  Subjective Assessment - 02/05/18 0931    Subjective  I feel so much better since getting out of the hospital. I've been diagnosed with CHF and they said I didn't have pneumonia the first hospitalization, it was the CHF developing. So now I'm on medication for that.     Pertinent History  Patient was diagnosed on 05/08/17 with right invasive ductal carcinoma breast cancer. She was seen in 5/18 and found to have a right axillary mass but did not f/u for further testing. She then had a mammogram in 2/19 and was diagnosed with right breast cancer which had metastasized to  the axillary lymph nodes. She had multiple small masses in the lower outer quadrant of her right breast with the largest mass measuring 1.3 cm. It is ER/PR positive and HER2 negative with a Ki67 of 2%. Patient underwent a bilateral mastectomy with right targeted node dissection with 4/7 positive on 06/05/17 for right invasive ductal carcinoma. She then underwent a right axillary node dissection on 06/26/17 with 2/20 positive nodes. Chemo completed 12/09/17 and began radiation 12/30/17.    Patient Stated Goals  Reduce swelling    Currently in Pain?  No/denies             LYMPHEDEMA/ONCOLOGY QUESTIONNAIRE - 02/05/18 1014      Right Upper Extremity Lymphedema   15 cm Proximal to Olecranon Process  33.3 cm    10 cm Proximal to Olecranon Process  31.6 cm    Olecranon Process  24.1 cm    10 cm Proximal to Ulnar Styloid Process  22.5 cm    Just Proximal to Ulnar Styloid Process  14.7 cm    Across Hand at [employer]  16.6 cm    At [place] of 2nd Digit  5.6 cm           Outpatient Rehab from 01/01/2018 in [place]  Lymphedema Life Impact Scale Total Score  23.53 %           [facility] Adult PT Treatment/Exercise - 02/05/18 0001      Self-Care   Other Self-Care Comments   Educated pt in importance of listening to body and not overtaxing herself due to new diagnosis of CHF. Instructed her and showed diagram of RPE scale and how to think of this when exercising or just going about ADLs, to try not to push herself past vigorous with ex and ADLs should feel light-moderate. Pt verbalized understanding.       Exercises   Exercises  Other Exercises    Other Exercises   Sit to stand: 15 reps in 30 sec      Knee/Hip Exercises: Aerobic   Nustep  Level 4, x9 mins PTA monitoring pt throughout and she reports her RPE after      Knee/Hip Exercises: Standing   Hip Flexion  Stengthening;Right;Left;10 reps;Knee straight   2 lb ankle weights; then alt marching x10 each   Hip Flexion Limitations  at back of bike for +2 HHA    Hip Abduction  Stengthening;Right;Left;10 reps   2 lb ankle weights   Abduction Limitations  at back of bike for +2 HHA    Hip Extension  Stengthening;Right;Left;10 reps   2 lb ankle weights   Extension Limitations  at back of bike for +2 HHA    Forward Step Up  Right;Left;10 reps;Hand Hold: 1;Step Height: 6"    Forward Step Up Limitations  At back of bike for +1 HHA; reporting RPE being an 8 after but in her legs, not overall fatigue      Shoulder Exercises: Seated   Other Seated  Exercises  Bil UE 3 way raises with 1 lb x10 each, to shoulder height             PT Education - 02/05/18 1039    Education provided  Yes    Education Details  Educated pt regarding RPE scale and how she needs to be mindful of how hard she feels her body is working since being diagnosed with CHF     Person(s) Educated  Patient    Methods  Explanation;Handout    Comprehension  Verbalized understanding       PT Short Term Goals - 01/01/18 1100      PT SHORT TERM GOAL #1   Title  Patient will verbalize where and how to be fitted for a night time compression garment.    Time  3    Period  Weeks    Status  New      PT SHORT TERM GOAL #2   Title  Patient will decease right arm swelling at 10 cm proximal to her ulnar styloid process to be </= 24 cm.    Baseline  25 cm    Time  3    Period  Weeks    Status  New      PT SHORT TERM GOAL #3   Title  Patient will decease right arm swelling at her olecranon to be </=26 cm.    Baseline  26.9    Time  3    Period  Weeks    Status  New        PT Long Term Goals - 02/05/18 1012      PT LONG TERM GOAL #1   Title  Patient will decease right arm swelling at 10 cm proximal to her ulnar styloid process to be </= 23 cm.    Baseline  25 cm; 22.5 cm-02/05/18    Time  6    Period  Weeks    Status  Achieved      PT LONG TERM GOAL #2   Title  Patient will decease right arm swelling at her olecranon to be </=25 cm.    Baseline  26.9 cm; 24.1 cm - 02/05/18    Status  Achieved      PT LONG TERM GOAL #3   Title  Patient will verbalize good understanding of the Maintenance Phase of treatment for lymphedema.    Status  Achieved      PT LONG TERM GOAL #4   Title  Patient will demonstrate proper technique for performing self manual lymph drainage.    Baseline  Pt reports feeling independent with this but still working on lightening her pressure-02/05/18    Status  Partially Met      PT LONG TERM GOAL #5   Title  Patient will report or  demonstrate she is able to walk 1 mile in </= 20 miles for normal speed with daily tasks.    Baseline  Patient currently able to walk 1 mile in 27 minutes.    Time  4    Period  Weeks    Status  New      PT LONG TERM GOAL #6   Title  Patient will report she is able to stand for >/= 30 minutes to do normal daily tasks without needing to rest.    Time  4    Period  Weeks    Status  New            Plan - 02/05/18 1041    Clinical Impression Statement  Pt returns to [place] after being admitted to the hospital for new diagnosis of CHF. She reports her SOB resolved and is eager to begin strengthening her bil LE's and work on improving her overall endurance. BEgan focusing on this today with continuing NuStep and added light weights to standing and seated LE and UE exs. She reported being surprised at how quickly her legs fatigued, but overall felt good being able to exercise. Her  lymphedema has seemed to resolve itself for now but pt has begun taking Lasix. Educated her that this can reduce swelling temporarily but because lymphedema is water and protein this process actually makes the fluid thicker and harder for her body to remove. So if she notices swelling at all to cont use of compression sleeve and she verbalized understanding this. She would like to focus on strength and endurance now so renewal done today by PT addressing these deficitis.     Rehab Potential  Excellent    Clinical Impairments Affecting Rehab Potential  Radiation to right axilla may worsen lymphedema    PT Frequency  2x / week    PT Duration  6 weeks    PT Treatment/Interventions  ADLs/Self Care Home Management;Therapeutic activities;Therapeutic exercise;Patient/family education;Manual techniques;Manual lymph drainage;Scar mobilization;Passive range of motion;DME Instruction    PT Next Visit Plan  Renewal done today. Focus now on bil LE strength and endurance, instruct pt in importance of beginning a daily walking program,  Stength ABC for HEP.Marland KitchenMarland Kitchen??    Consulted and Agree with Plan of Care  Patient       Patient will benefit from skilled therapeutic intervention in order to improve the following deficits and impairments:  Postural dysfunction, Decreased knowledge of precautions, Impaired UE functional use, Pain, Increased edema, Decreased range of motion, Decreased mobility, Decreased strength  Visit Diagnosis: Abnormal posture - Plan: PT plan of care cert/re-cert  Aftercare following surgery for neoplasm - Plan: PT plan of care cert/re-cert  Postmastectomy lymphedema - Plan: PT plan of care cert/re-cert  Muscle weakness (generalized) - Plan: PT plan of care cert/re-cert     Problem List Patient Active Problem List   Diagnosis Date Noted  . Acute respiratory failure with hypoxia ([place]) 01/27/2018  . Anemia 01/27/2018  . Chest tube in place   . HCAP (healthcare-associated pneumonia) 01/13/2018  . Pleural effusion 01/13/2018  . Breast cancer metastasized to axillary lymph node, right ([place]) 06/05/2017  . Genetic testing 05/27/2017  . Malignant neoplasm of axillary tail of right breast in female, estrogen receptor positive ([place]) 05/09/2017  . Cubital tunnel syndrome on left 10/02/2015  . Trigger finger, left index finger 04/12/2015  . Erroneous encounter - disregard 09/22/2014  . Diabetes type 1, controlled ([place]) 01/12/2013    SMITH,MARTI COOPER, PTA 02/05/2018, 12:58 PM  [place], [place], [address] Phone: [phone]   Fax:  [phone]  Name: Stephanie Holden MRN: [id number] Date of Birth: [birth year]

## 2018-02-06 ENCOUNTER — Encounter (HOSPITAL_COMMUNITY): Payer: Self-pay | Admitting: Internal Medicine

## 2018-02-06 ENCOUNTER — Ambulatory Visit
Admission: RE | Admit: 2018-02-06 | Discharge: 2018-02-06 | Disposition: A | Discharge status: Home / Self Care | Payer: BLUE CROSS/BLUE SHIELD | Source: Ambulatory Visit | Attending: Radiation Oncology | Admitting: Radiation Oncology

## 2018-02-06 ENCOUNTER — Other Ambulatory Visit: Payer: Self-pay

## 2018-02-06 ENCOUNTER — Ambulatory Visit (HOSPITAL_COMMUNITY)
Admission: RE | Admit: 2018-02-06 | Discharge: 2018-02-06 | Disposition: A | Discharge status: Home / Self Care | Payer: BLUE CROSS/BLUE SHIELD | Source: Ambulatory Visit | Attending: Internal Medicine | Admitting: Internal Medicine

## 2018-02-06 VITALS — BP 102/74 | HR 101

## 2018-02-06 DIAGNOSIS — C50611 Malignant neoplasm of axillary tail of right female breast: Secondary | ICD-10-CM | POA: Diagnosis not present

## 2018-02-06 DIAGNOSIS — E109 Type 1 diabetes mellitus without complications: Secondary | ICD-10-CM | POA: Diagnosis not present

## 2018-02-06 DIAGNOSIS — Z7951 Long term (current) use of inhaled steroids: Secondary | ICD-10-CM | POA: Diagnosis not present

## 2018-02-06 DIAGNOSIS — Z510 Encounter for antineoplastic radiation therapy: Secondary | ICD-10-CM | POA: Diagnosis not present

## 2018-02-06 DIAGNOSIS — Z9013 Acquired absence of bilateral breasts and nipples: Secondary | ICD-10-CM | POA: Insufficient documentation

## 2018-02-06 DIAGNOSIS — C50911 Malignant neoplasm of unspecified site of right female breast: Secondary | ICD-10-CM

## 2018-02-06 DIAGNOSIS — Z79899 Other long term (current) drug therapy: Secondary | ICD-10-CM | POA: Diagnosis not present

## 2018-02-06 DIAGNOSIS — Z87891 Personal history of nicotine dependence: Secondary | ICD-10-CM | POA: Diagnosis not present

## 2018-02-06 DIAGNOSIS — I427 Cardiomyopathy due to drug and external agent: Secondary | ICD-10-CM | POA: Diagnosis not present

## 2018-02-06 DIAGNOSIS — Z9641 Presence of insulin pump (external) (internal): Secondary | ICD-10-CM | POA: Insufficient documentation

## 2018-02-06 DIAGNOSIS — C773 Secondary and unspecified malignant neoplasm of axilla and upper limb lymph nodes: Secondary | ICD-10-CM

## 2018-02-06 DIAGNOSIS — Z9221 Personal history of antineoplastic chemotherapy: Secondary | ICD-10-CM | POA: Insufficient documentation

## 2018-02-06 DIAGNOSIS — Z170 Estrogen receptor positive status [ER+]: Secondary | ICD-10-CM | POA: Diagnosis not present

## 2018-02-06 DIAGNOSIS — Z8249 Family history of ischemic heart disease and other diseases of the circulatory system: Secondary | ICD-10-CM | POA: Insufficient documentation

## 2018-02-06 DIAGNOSIS — I5021 Acute systolic (congestive) heart failure: Secondary | ICD-10-CM

## 2018-02-06 DIAGNOSIS — K219 Gastro-esophageal reflux disease without esophagitis: Secondary | ICD-10-CM | POA: Insufficient documentation

## 2018-02-06 DIAGNOSIS — T451X5A Adverse effect of antineoplastic and immunosuppressive drugs, initial encounter: Secondary | ICD-10-CM

## 2018-02-06 DIAGNOSIS — Z885 Allergy status to narcotic agent status: Secondary | ICD-10-CM | POA: Diagnosis not present

## 2018-02-06 DIAGNOSIS — F329 Major depressive disorder, single episode, unspecified: Secondary | ICD-10-CM | POA: Insufficient documentation

## 2018-02-06 MED ORDER — SPIRONOLACTONE 25 MG PO TABS: 12.5000 mg | ORAL_TABLET | Freq: Every day | ORAL | 3 refills | Status: DC

## 2018-02-06 NOTE — Patient Instructions (Signed)
Take Spironolactone 12.5mg  daily.  Take Lasix only as needed.  Your provider requests you have a Cardiac MRI. (they will contact you to schedule)   Labs in 1 week with PCP.  Pharmacy appointment in  2 weeks.  Follow up with Dr.Bensimhon in 2 months.

## 2018-02-06 NOTE — Progress Notes (Signed)
Advanced Heart Failure Clinic Note   Referring Physician: Dr Lindi Adie PCP: Tisovec, Fransico Him, MD PCP-Cardiologist: Glori Bickers, MD   HPI: Stephanie Holden is a 42 y.o. female with a history of type 1 DM on insulin pump, thyroid goiter, and breast cancer.     Malignant neoplasm of axillary tail of right breast in female, estrogen receptor positive ([place])   05/09/2017 Initial Diagnosis    May 2018: Palpable right axillary mass.  The recommended a 6-week follow-up but apparently the mass decreased in size.  Recent increase in the mass was noted, biopsy revealed Grade 1 IDC with DCIS, lymphovascular invasion present, 4 small hypoechoic masses largest 0.9 cm, T1BN1 stage Ib clinical stage      06/05/2017 Surgery    Bilateral mastectomies: Right mastectomy: IDC grade 1, 3 foci 1.2 cm, 1.2 cm, 1 cm, IgA DCIS, LVH diffuse present, margins negative, 4/7 lymph nodes positive with extracapsular extension, ER 95%, PR 95%, HER-2 negative, Ki-67 2% to 10%, T1 cN2 aM0 stage II a pathological stage; left mastectomy: Benign      06/26/2017 Surgery    Axillary lymph node dissection: 2/22 lymph nodes positive (making a total of 6+ lymph nodes)      07/22/2017 -  Chemotherapy    Adjuvant chemotherapy with dose dense Adriamycin and Cytoxan x4 followed by Taxol weekly x12    I saw her in 6/19 for the first time in the cardio-oncology clinic in 6/19. She had recently completed chemo and echo showed EF 60-65%. She was scheduled to see me in 6 months with repeat echo to ensure stability. Unfortunately she has been admitted 2x over the past month. Initially felt to have PNA but then found to have systolic HF with EF 77-93%. Course c/b large R pleural effusion. Initially treated with thoracentesis on 01/13/18 but then recurred so chest tube was placed. Effusion found to be transudative with negative cytology. Was diuresed in hospital and f/u arranged here.   Now feels much better. Able to start  walking again without dyspnea. No CP, edema, orthopnea or PND. Has one more XRT treatment on Monday and is then finished with treatment.   Echo 01/28/18 EF 25-30% Grade II DD. Mild RV dysfunction Personally reviewed  Echo 6/19 EF 60-65% LS 11.90 GLS 18.7%    Past Medical History:  Diagnosis Date  . Cancer [facility])    Breast cancer - right  . Constipation   . Depression   . Genetic testing 05/27/2017   Breast/GYN panel (23 genes) @ [facility] - No pathogenic mutations detected  . GERD (gastroesophageal reflux disease)   . Insulin dependent type 1 diabetes mellitus ([place])   . Insulin pump in place   . PONV (postoperative nausea and vomiting)   . Stenosing tenosynovitis of thumb 06/2014   right  . Thyroid goiter     Current Outpatient Medications  Medication Sig Dispense Refill  . albuterol (PROVENTIL HFA;VENTOLIN HFA) 108 (90 Base) MCG/ACT inhaler Inhale 1-2 puffs into the lungs every 6 (six) hours as needed for wheezing or shortness of breath.    . fluticasone furoate-vilanterol (BREO ELLIPTA) 200-25 MCG/INH AEPB Inhale 1 puff into the lungs daily.    . furosemide (LASIX) 40 MG tablet Take 20 mg by mouth as needed.    Marland Kitchen guaiFENesin-dextromethorphan (ROBITUSSIN DM) 100-10 MG/5ML syrup Take 5 mLs by mouth every 4 (four) hours as needed for cough. 118 mL 0  . hyaluronate sodium (RADIAPLEXRX) GEL Apply 1 application topically once.    Marland Kitchen  insulin aspart (NOVOLOG) 100 UNIT/ML injection Inject 45 Units into the skin See admin instructions. Use via insulin pump up to 45 units daily    . lidocaine-prilocaine (EMLA) cream Apply to affected area once 30 g 3  . LORazepam (ATIVAN) 0.5 MG tablet TAKE 1 TABLET BY MOUTH EVERY 6 HOURS AS NEEDED FOR ANXIETY OR SLEEP 30 tablet 0  . ondansetron (ZOFRAN) 8 MG tablet Take 1 tablet (8 mg total) by mouth 2 (two) times daily as needed. Start on the third day after chemotherapy. 30 tablet 1  . oxyCODONE-acetaminophen (PERCOCET/ROXICET) 5-325 MG tablet Take 1 tablet  by mouth every 8 (eight) hours as needed for severe pain. 60 tablet 0  . potassium chloride SA (K-DUR,KLOR-CON) 20 MEQ tablet Take 10 mEq by mouth every other day.    . Probiotic Product (PROBIOTIC DAILY PO) Take 1 capsule by mouth at bedtime.     . sacubitril-valsartan (ENTRESTO) 24-26 MG Take 1 tablet by mouth 2 (two) times daily. 60 tablet 0  . venlafaxine XR (EFFEXOR-XR) 75 MG 24 hr capsule Take 1 capsule (75 mg total) by mouth daily with breakfast. 30 capsule 3  . spironolactone (ALDACTONE) 25 MG tablet Take 0.5 tablets (12.5 mg total) by mouth daily. 15 tablet 3   No current facility-administered medications for this encounter.     Allergies  Allergen Reactions  . Dilaudid [Hydromorphone Hcl] Itching and Other (See Comments)    UNCONTROLLABLE CRYING      Social History   Socioeconomic History  . Marital status: Married    Spouse name: Not on file  . Number of children: Not on file  . Years of education: Not on file  . Highest education level: Not on file  Occupational History  . Not on file  Social Needs  . Financial resource strain: Not on file  . Food insecurity:    Worry: Not on file    Inability: Not on file  . Transportation needs:    Medical: No    Non-medical: No  Tobacco Use  . Smoking status: Former Smoker    Last attempt to quit: 03/24/2001    Years since quitting: 16.8  . Smokeless tobacco: Never Used  Substance and Sexual Activity  . Alcohol use: Not Currently  . Drug use: No  . Sexual activity: Yes    Partners: Male    Birth control/protection: Surgical    Comment: tubal ligation & 1 tube removed  Lifestyle  . Physical activity:    Days per week: Not on file    Minutes per session: Not on file  . Stress: Not on file  Relationships  . Social connections:    Talks on phone: Not on file    Gets together: Not on file    Attends religious service: Not on file    Active member of club or organization: Not on file    Attends meetings of clubs or  organizations: Not on file    Relationship status: Not on file  . Intimate partner violence:    Fear of current or ex partner: No    Emotionally abused: No    Physically abused: No    Forced sexual activity: No  Other Topics Concern  . Not on file  Social History Narrative  . Not on file      Family History  Problem Relation Age of Onset  . Hypertension Mother   . Hypertension Father   . Breast cancer Other 34  paternal grandfather's niece    Vitals:   02/06/18 0955  BP: 102/74  Pulse: (!) 101     PHYSICAL EXAM: General:  Well appearing. No resp difficulty HEENT: normal x for alopecia Neck: supple. no JVD. Carotids 2+ bilat; no bruits. No lymphadenopathy or thryomegaly appreciated. Cor: PMI nondisplaced. Tachy regular. No s3. No rubs, gallops or murmurs. Lungs: clear Abdomen: soft, nontender, nondistended. No hepatosplenomegaly. No bruits or masses. Good bowel sounds. Extremities: no cyanosis, clubbing, rash, edema Neuro: alert & orientedx3, cranial nerves grossly intact. moves all 4 extremities w/o difficulty. Affect pleasant  ECG: Sinus tach 100 nonspecific T wave abnormalities Personally reviewed   ASSESSMENT & PLAN:  1. Acute systolic HF - echo 2/54 EF 60% - echo 11/19 EF 25-30% - given time course and her age this almost certainly adriamycin-induced cardiotoxicty however Type I DM is a confounder though this has been very well controlled - NYHA II - Volume status ok - Continue Entresto 24/26 bid - Start spiro 12.71m daily - Change lasix/kcl to prn - F/u with PharmD in 2 weeks to begin med titration and possible add low-dose carvedilol  - Proceed with CMRI  2. Malignant neoplasm of axillary tail of right breast in female, estrogen receptor positive  - Diagnosed with breast cancer 05/09/17. Underwent bilateral mastectomies on 06/05/17. Underwent axillary lymph node dissection on 06/26/17. Started chemo 07/22/17 with adriamycin and cytoxan x4, then taxol  weekly x12. No finishing XRT on 02/09/18 - Echo 06/20/17: EF 55-60%, trivial TR. - Echo 6/19 EF 60-65% LS 11.90 GLS 18.7% - Echo 11/19 EF 25-30% suggestive of adriamycin cardiotoxicity  3. R pleural effusion - s/p chest tube drainage 11/19 - transudative. Cytology negative   DGlori Bickers MD  9:11 PM

## 2018-02-09 ENCOUNTER — Telehealth: Payer: Self-pay | Admitting: Hematology and Oncology

## 2018-02-09 ENCOUNTER — Encounter: Payer: Self-pay | Admitting: *Deleted

## 2018-02-09 ENCOUNTER — Ambulatory Visit: Payer: BLUE CROSS/BLUE SHIELD

## 2018-02-09 ENCOUNTER — Ambulatory Visit
Admission: RE | Admit: 2018-02-09 | Discharge: 2018-02-09 | Disposition: A | Discharge status: Home / Self Care | Payer: BLUE CROSS/BLUE SHIELD | Source: Ambulatory Visit | Attending: Radiation Oncology | Admitting: Radiation Oncology

## 2018-02-09 ENCOUNTER — Inpatient Hospital Stay: Payer: BLUE CROSS/BLUE SHIELD | Attending: Hematology and Oncology | Admitting: Hematology and Oncology

## 2018-02-09 DIAGNOSIS — Z510 Encounter for antineoplastic radiation therapy: Secondary | ICD-10-CM | POA: Diagnosis not present

## 2018-02-09 DIAGNOSIS — E1142 Type 2 diabetes mellitus with diabetic polyneuropathy: Secondary | ICD-10-CM

## 2018-02-09 DIAGNOSIS — Z9013 Acquired absence of bilateral breasts and nipples: Secondary | ICD-10-CM | POA: Diagnosis not present

## 2018-02-09 DIAGNOSIS — I509 Heart failure, unspecified: Secondary | ICD-10-CM

## 2018-02-09 DIAGNOSIS — C773 Secondary and unspecified malignant neoplasm of axilla and upper limb lymph nodes: Secondary | ICD-10-CM

## 2018-02-09 DIAGNOSIS — Z170 Estrogen receptor positive status [ER+]: Secondary | ICD-10-CM

## 2018-02-09 DIAGNOSIS — R5383 Other fatigue: Secondary | ICD-10-CM

## 2018-02-09 DIAGNOSIS — C50611 Malignant neoplasm of axillary tail of right female breast: Secondary | ICD-10-CM

## 2018-02-09 MED ORDER — GABAPENTIN 300 MG PO CAPS: 300.0000 mg | ORAL_CAPSULE | Freq: Every day | ORAL | 3 refills | Status: DC

## 2018-02-09 MED ORDER — TAMOXIFEN CITRATE 20 MG PO TABS: 20.0000 mg | ORAL_TABLET | Freq: Every day | ORAL | 3 refills | Status: DC

## 2018-02-09 NOTE — Assessment & Plan Note (Signed)
06/05/2017:Bilateral mastectomies: Right mastectomy: IDC grade 1, 3 foci 1.2 cm, 1.2 cm, 1 cm, IgA DCIS, LVH diffuse present, margins negative, 4/7 lymph nodes positive with extracapsular extension, ER 95%, PR 95%, HER-2 negative, Ki-67 2% to 10%, T1 cN2 aM0 stage II a pathological stage; left mastectomy: Benign  Mammaprint done preoperatively: Low risk Axillary lymph node dissection 06/26/2017: 2/22 lymph nodes positive(making a total of 6 lymph nodes that were positive)  Treatment plan: 1.Because ofher high risk features (6 LN Pos),werecommended adjuvant systemic chemotherapywithdose dense Adriamycin and Cytoxan x4 followed by Taxol weekly x12  07/22/2017-12/09/2017 3.Followed by radiation.  Started 12/30/2017 4.Followed by adjuvant antiestrogen therapy  UPBEAT clinical trial ([id number]):No toxicities related to the trial --------------------------------------------------------------------------- Acute congestive heart failure hospitalization 01/27/2018 to 02/01/2018: EF 25% Follows with Dr. Haroldine Laws Currently on Entresto and Lasix.  Since she completed radiation, she needs to begin antiestrogen therapy with tamoxifen 20 mg daily  We discussed the risks and benefits of tamoxifen. These include but not limited to insomnia, hot flashes, mood changes, vaginal dryness, and weight gain. Although rare, serious side effects including endometrial cancer, risk of blood clots were also discussed. We strongly believe that the benefits far outweigh the risks. Patient understands these risks and consented to starting treatment. Planned treatment duration is 10 years.  Return to clinic in 3 months for survivorship care plan was

## 2018-02-09 NOTE — Telephone Encounter (Signed)
Gave patient avs.  Did not need calendar.

## 2018-02-09 NOTE — Progress Notes (Signed)
Patient Care Team: Tisovec, Fransico Him, MD as PCP - General (Internal Medicine) Bensimhon, Shaune Pascal, MD as PCP - Cardiology (Cardiology)  DIAGNOSIS:  Encounter Diagnosis  Name Primary?  . Malignant neoplasm of axillary tail of right breast in female, estrogen receptor positive ([place])     SUMMARY OF ONCOLOGIC HISTORY:   Malignant neoplasm of axillary tail of right breast in female, estrogen receptor positive ([place])   05/09/2017 Initial Diagnosis    May 2018: Palpable right axillary mass.  The recommended a 6-week follow-up but apparently the mass decreased in size.  Recent increase in the mass was noted, biopsy revealed Grade 1 IDC with DCIS, lymphovascular invasion present, 4 small hypoechoic masses largest 0.9 cm, T1BN1 stage Ib clinical stage    06/05/2017 Surgery    Bilateral mastectomies: Right mastectomy: IDC grade 1, 3 foci 1.2 cm, 1.2 cm, 1 cm, IgA DCIS, LVH diffuse present, margins negative, 4/7 lymph nodes positive with extracapsular extension, ER 95%, PR 95%, HER-2 negative, Ki-67 2% to 10%, T1 cN2 aM0 stage II a pathological stage; left mastectomy: Benign    06/26/2017 Surgery    Axillary lymph node dissection: 2/22 lymph nodes positive (making a total of 6+ lymph nodes)    07/22/2017 - 12/09/2017 Chemotherapy    Adjuvant chemotherapy with dose dense Adriamycin and Cytoxan x4 followed by Taxol weekly x12     12/23/2017 Cancer Staging    Staging form: Breast, AJCC 8th Edition - Pathologic: Stage IB (pT1c, pN2a, cM0, G1, ER+, PR+, HER2-) - Signed by Eppie Gibson, MD on 12/23/2017    12/30/2017 - 02/09/2018 Radiation Therapy    Adjuvant radiation    01/12/2018 Imaging    No PE, patchy consolidation in both lungs groundglass opacities, differential infection versus radiation pneumonitis, right pleural effusion moderate to large, left pleural effusion moderate    01/27/2018 - 02/01/2018 Hospital Admission    Hospital admission for congestive heart failure with pleural effusion, EF  25% suspected to be related to Adriamycin     Breast cancer metastasized to axillary lymph node, right ([place])   06/05/2017 Initial Diagnosis    Breast cancer metastasized to axillary lymph node, right ([place])    07/22/2017 -  Chemotherapy    Adjuvant chemotherapy with dose dense Adriamycin and Cytoxan x4 followed by Taxol weekly x12      CHIEF COMPLIANT: Follow-up after recent hospitalization for congestive heart failure  INTERVAL HISTORY: Stephanie Holden is a 42 year old with above-mentioned history of bilateral mastectomies followed by adjuvant chemotherapy and is currently on radiation.  Tomorrow is her last day of radiation.  She was hospitalized twice with pleural effusion and lung infiltrates and was diagnosed with acute congestive heart failure related to Adriamycin.  When she got on medication, she is feeling remarkably better.  She has fatigue as result of radiation.  She also has neuropathy in her hands and feet.  REVIEW OF SYSTEMS:   Constitutional: Denies fevers, chills or abnormal weight loss Eyes: Denies blurriness of vision Ears, nose, mouth, throat, and face: Denies mucositis or sore throat Respiratory: Denies cough, dyspnea or wheezes Cardiovascular: Denies palpitation, chest discomfort Gastrointestinal:  Denies nausea, heartburn or change in bowel habits Skin: Denies abnormal skin rashes Lymphatics: Denies new lymphadenopathy or easy bruising Neurological: Peripheral neuropathy Behavioral/Psych: Mood is stable, no new changes  Extremities: No lower extremity edema  All other systems were reviewed with the patient and are negative.  I have reviewed the past medical history, past surgical history, social history and family  history with the patient and they are unchanged from previous note.  ALLERGIES:  is allergic to dilaudid [hydromorphone hcl].  MEDICATIONS:  Current Outpatient Medications  Medication Sig Dispense Refill  . albuterol (PROVENTIL HFA;VENTOLIN HFA) 108  (90 Base) MCG/ACT inhaler Inhale 1-2 puffs into the lungs every 6 (six) hours as needed for wheezing or shortness of breath.    . fluticasone furoate-vilanterol (BREO ELLIPTA) 200-25 MCG/INH AEPB Inhale 1 puff into the lungs daily.    . furosemide (LASIX) 40 MG tablet Take 20 mg by mouth as needed.    Marland Kitchen guaiFENesin-dextromethorphan (ROBITUSSIN DM) 100-10 MG/5ML syrup Take 5 mLs by mouth every 4 (four) hours as needed for cough. 118 mL 0  . hyaluronate sodium (RADIAPLEXRX) GEL Apply 1 application topically once.    . insulin aspart (NOVOLOG) 100 UNIT/ML injection Inject 45 Units into the skin See admin instructions. Use via insulin pump up to 45 units daily    . lidocaine-prilocaine (EMLA) cream Apply to affected area once 30 g 3  . LORazepam (ATIVAN) 0.5 MG tablet TAKE 1 TABLET BY MOUTH EVERY 6 HOURS AS NEEDED FOR ANXIETY OR SLEEP 30 tablet 0  . ondansetron (ZOFRAN) 8 MG tablet Take 1 tablet (8 mg total) by mouth 2 (two) times daily as needed. Start on the third day after chemotherapy. 30 tablet 1  . oxyCODONE-acetaminophen (PERCOCET/ROXICET) 5-325 MG tablet Take 1 tablet by mouth every 8 (eight) hours as needed for severe pain. 60 tablet 0  . potassium chloride SA (K-DUR,KLOR-CON) 20 MEQ tablet Take 10 mEq by mouth every other day.    . Probiotic Product (PROBIOTIC DAILY PO) Take 1 capsule by mouth at bedtime.     . sacubitril-valsartan (ENTRESTO) 24-26 MG Take 1 tablet by mouth 2 (two) times daily. 60 tablet 0  . spironolactone (ALDACTONE) 25 MG tablet Take 0.5 tablets (12.5 mg total) by mouth daily. 15 tablet 3  . venlafaxine XR (EFFEXOR-XR) 75 MG 24 hr capsule Take 1 capsule (75 mg total) by mouth daily with breakfast. 30 capsule 3   No current facility-administered medications for this visit.     PHYSICAL EXAMINATION: ECOG PERFORMANCE STATUS: 1 - Symptomatic but completely ambulatory  Vitals:   02/09/18 0944  BP: 123/82  Pulse: 99  Resp: 17  Temp: 98 F (36.7 C)  SpO2: 100%    Filed Weights   02/09/18 0944  Weight: 158 lb 9.6 oz (71.9 kg)    GENERAL:alert, no distress and comfortable SKIN: skin color, texture, turgor are normal, no rashes or significant lesions EYES: normal, Conjunctiva are pink and non-injected, sclera clear OROPHARYNX:no exudate, no erythema and lips, buccal mucosa, and tongue normal  NECK: supple, thyroid normal size, non-tender, without nodularity LYMPH:  no palpable lymphadenopathy in the cervical, axillary or inguinal LUNGS: clear to auscultation and percussion with normal breathing effort HEART: regular rate & rhythm and no murmurs and no lower extremity edema ABDOMEN:abdomen soft, non-tender and normal bowel sounds MUSCULOSKELETAL:no cyanosis of digits and no clubbing  NEURO: alert & oriented x 3 with fluent speech, grade 1-2 peripheral neuropathy EXTREMITIES: No lower extremity edema   LABORATORY DATA:  I have reviewed the data as listed CMP Latest Ref Rng & Units 02/01/2018 01/31/2018 01/30/2018  Glucose 70 - 99 mg/dL 183(H) 306(H) 224(H)  BUN 6 - 20 mg/dL _0 Creatinine 0.44 - 1.00 mg/dL 0.63 0.80 0.69  Sodium 135 - 145 mmol/L 139 137 140  Potassium 3.5 - 5.1 mmol/L 4.0 3.9 3.8  Chloride  98 - 111 mmol/L 101 97(L) 103  CO2 22 - 32 mmol/L _0 Calcium 8.9 - 10.3 mg/dL 8.9 8.7(L) 8.2(L)  Total Protein 6.5 - 8.1 g/dL - - -  Total Bilirubin 0.3 - 1.2 mg/dL - - -  Alkaline Phos 38 - 126 U/L - - -  AST 15 - 41 U/L - - -  ALT 0 - 44 U/L - - -    Lab Results  Component Value Date   WBC 5.8 02/01/2018   HGB 12.4 02/01/2018   HCT 40.7 02/01/2018   MCV 85.5 02/01/2018   PLT 365 02/01/2018   NEUTROABS 8.7 (H) 01/28/2018    ASSESSMENT & PLAN:  Malignant neoplasm of axillary tail of right breast in female, estrogen receptor positive ([place]) 06/05/2017:Bilateral mastectomies: Right mastectomy: IDC grade 1, 3 foci 1.2 cm, 1.2 cm, 1 cm, IgA DCIS, LVH diffuse present, margins negative, 4/7 lymph nodes positive with  extracapsular extension, ER 95%, PR 95%, HER-2 negative, Ki-67 2% to 10%, T1 cN2 aM0 stage II a pathological stage; left mastectomy: Benign  Mammaprint done preoperatively: Low risk Axillary lymph node dissection 06/26/2017: 2/22 lymph nodes positive(making a total of 6 lymph nodes that were positive)  Treatment plan: 1.Because ofher high risk features (6 LN Pos),werecommended adjuvant systemic chemotherapywithdose dense Adriamycin and Cytoxan x4 followed by Taxol weekly x12  07/22/2017-12/09/2017 3.Followed by radiation.  Started 12/30/2017 4.Followed by adjuvant antiestrogen therapy  UPBEAT clinical trial ([id number]):No toxicities related to the trial --------------------------------------------------------------------------- Acute congestive heart failure hospitalization 01/27/2018 to 02/01/2018: EF 25% Follows with Dr. Haroldine Laws Currently on Entresto and Lasix.  Since she completed radiation, she needs to begin antiestrogen therapy with tamoxifen 20 mg daily, she will start this March 25, 2018  We discussed the risks and benefits of tamoxifen. These include but not limited to insomnia, hot flashes, mood changes, vaginal dryness, and weight gain. Although rare, serious side effects including endometrial cancer, risk of blood clots were also discussed. We strongly believe that the benefits far outweigh the risks. Patient understands these risks and consented to starting treatment. Planned treatment duration is 10 years.  Grade 2 peripheral neuropathy: Prescribed gabapentin to be taken at bedtime.  Because of diabetes she is more prone for neuropathy.  Return to clinic in April for survivorship care plan visit    No orders of the defined types were placed in this encounter.  The patient has a good understanding of the overall plan. she agrees with it. she will call with any problems that may develop before the next visit here.   Harriette Ohara, MD 02/09/18

## 2018-02-10 ENCOUNTER — Ambulatory Visit
Admission: RE | Admit: 2018-02-10 | Discharge: 2018-02-10 | Disposition: A | Discharge status: Home / Self Care | Payer: BLUE CROSS/BLUE SHIELD | Source: Ambulatory Visit | Attending: Radiation Oncology | Admitting: Radiation Oncology

## 2018-02-10 ENCOUNTER — Encounter: Payer: Self-pay | Admitting: Radiation Oncology

## 2018-02-10 DIAGNOSIS — Z510 Encounter for antineoplastic radiation therapy: Secondary | ICD-10-CM | POA: Diagnosis not present

## 2018-02-10 DIAGNOSIS — C50611 Malignant neoplasm of axillary tail of right female breast: Secondary | ICD-10-CM | POA: Diagnosis not present

## 2018-02-10 DIAGNOSIS — Z170 Estrogen receptor positive status [ER+]: Secondary | ICD-10-CM | POA: Diagnosis not present

## 2018-02-10 NOTE — Progress Notes (Signed)
  Radiation Oncology         [phone] ________________________________  Name: Stephanie Holden MRN: [id number]  Date: 02/10/2018  DOB: 19-Oct-1975  End of Treatment Note  Diagnosis:   42 y.o. female with Malignant neoplasm of axillary tail of right breast in female, estrogen receptor positive ([place]) Staging form: Breast, AJCC 8th Edition - Clinical stage from 05/14/2017: Stage IB (cT1c, cN1, cM0, G1, ER+, PR+, HER2-) - Unsigned - Pathologic: Stage IB (pT1c, pN2a, cM0, G1, ER+, PR+, HER2-) - Signed by Eppie Gibson, MD on 12/23/2017    Indication for treatment:  Curative       Radiation treatment dates:   12/30/2017 - 02/10/2018  Site/dose:    1. Right Chest Wall / 50 Gy in 25 fractions 2. Right SCV, PAB nodes / 50 Gy in 25 fractions 3. Right Chest Wall Boost / 10 Gy in 5 fractions  Beams/energy:    1. 3D / 6X, 10X Photon  2. 3D / 10X, 6X Photon 3. Electrons / 6MeV    Narrative: The patient tolerated radiation treatment relatively well.   She experienced soreness to her chest and some expected skin irritation as she progressed through treatment. She has resolving moist desquamation in the right axilla, and the remainder of her treatment fields show erythema with a little dryness. I advised her to apply Neosporin to areas of moist desquamation until dry and Radiaplex every else, then Radiaplex only after her skin has dried.  She was hospitalized during the course due to a pleural effusion, unrelated to radiotherapy.  Plan: The patient has completed radiation treatment. The patient will return to radiation oncology clinic for routine followup in one month. I advised them to call or return sooner if they have any questions or concerns related to their recovery or treatment.  -----------------------------------  Eppie Gibson, MD  This document serves as a record of services personally performed by Eppie Gibson, MD. It was created on her behalf by Rae Lips, a trained medical scribe.  The creation of this record is based on the scribe's personal observations and the provider's statements to them. This document has been checked and approved by the attending provider.

## 2018-02-12 ENCOUNTER — Other Ambulatory Visit (HOSPITAL_COMMUNITY): Payer: Self-pay | Admitting: *Deleted

## 2018-02-12 DIAGNOSIS — I5020 Unspecified systolic (congestive) heart failure: Secondary | ICD-10-CM

## 2018-02-17 ENCOUNTER — Ambulatory Visit: Payer: BLUE CROSS/BLUE SHIELD

## 2018-02-20 DIAGNOSIS — I5020 Unspecified systolic (congestive) heart failure: Secondary | ICD-10-CM | POA: Diagnosis not present

## 2018-02-24 ENCOUNTER — Other Ambulatory Visit: Payer: Self-pay | Admitting: General Surgery

## 2018-02-24 ENCOUNTER — Ambulatory Visit (HOSPITAL_COMMUNITY)
Admission: RE | Admit: 2018-02-24 | Discharge: 2018-02-24 | Disposition: A | Discharge status: Home / Self Care | Payer: BLUE CROSS/BLUE SHIELD | Source: Ambulatory Visit | Attending: Cardiology | Admitting: Cardiology

## 2018-02-24 DIAGNOSIS — C50611 Malignant neoplasm of axillary tail of right female breast: Secondary | ICD-10-CM | POA: Diagnosis not present

## 2018-02-24 DIAGNOSIS — Z9221 Personal history of antineoplastic chemotherapy: Secondary | ICD-10-CM | POA: Insufficient documentation

## 2018-02-24 DIAGNOSIS — Z170 Estrogen receptor positive status [ER+]: Secondary | ICD-10-CM | POA: Diagnosis not present

## 2018-02-24 DIAGNOSIS — J90 Pleural effusion, not elsewhere classified: Secondary | ICD-10-CM | POA: Diagnosis not present

## 2018-02-24 DIAGNOSIS — Z794 Long term (current) use of insulin: Secondary | ICD-10-CM | POA: Diagnosis not present

## 2018-02-24 DIAGNOSIS — E049 Nontoxic goiter, unspecified: Secondary | ICD-10-CM | POA: Insufficient documentation

## 2018-02-24 DIAGNOSIS — I5022 Chronic systolic (congestive) heart failure: Secondary | ICD-10-CM | POA: Diagnosis not present

## 2018-02-24 DIAGNOSIS — Z9013 Acquired absence of bilateral breasts and nipples: Secondary | ICD-10-CM | POA: Insufficient documentation

## 2018-02-24 DIAGNOSIS — E109 Type 1 diabetes mellitus without complications: Secondary | ICD-10-CM | POA: Insufficient documentation

## 2018-02-24 MED ORDER — CARVEDILOL 3.125 MG PO TABS: 3.1250 mg | ORAL_TABLET | Freq: Two times a day (BID) | ORAL | 5 refills | Status: DC

## 2018-02-24 NOTE — Patient Instructions (Signed)
It was great to meet you today!  Please START carvedilol 3.125 mg TWICE DAILY.   Please keep your appointment with Dr. Haroldine Laws on 04/09/18.

## 2018-02-24 NOTE — Progress Notes (Signed)
HF MD: Haroldine Laws  HPI:  Stephanie Holden is a 42 y.o. female with a history of type 1 DM on insulin pump, thyroid goiter, and breast cancer.   Dr. Haroldine Laws saw her for the first time in the cardio-oncology clinic in 6/19. She had recently completed chemo and echo showed EF 60-65%. She was scheduled to see me in 6 months with repeat echo to ensure stability. Unfortunately she has been admitted 2x over the past month. Initially felt to have PNA but then found to have systolic HF with EF 33-54%. Course c/b large R pleural effusion. Initially treated with thoracentesis on 01/13/18 but then recurred so chest tube was placed. Effusion found to be transudative with negative cytology. Was diuresed in hospital and f/u arranged here.   She returns today for pharmacist-led HF medication titration. At last HF clinic visit on 11/15, she was started on spironolactone 12.5 mg daily. Now feels much better. Able to start walking again without dyspnea. No CP, edema, orthopnea or PND.     Marland Kitchen Shortness of breath/dyspnea on exertion? no  . Orthopnea/PND? no . Edema? no . Lightheadedness/dizziness? Yes - rarely upon standing too quickly . Daily weights at home? Yes - stable 157 lb  . Blood pressure/heart rate monitoring at home? Yes - 104/70-127/89 mmHg and 89-107 bpm . Following low-sodium/fluid-restricted diet? yes  HF Medications: Furosemide 20 mg PO PRN weight gain/SOB - used 1 x in the past month KCl 10 meq PO PRN with furosemide Entresto 24-26 mg PO BID Spironolactone 12.5 mg PO daily   Has the patient been experiencing any side effects to the medications prescribed?  no  Does the patient have any problems obtaining medications due to transportation or finances?   No - [employer]  Understanding of regimen: good Understanding of indications: good Potential of compliance: good Patient understands to avoid NSAIDs. Patient understands to avoid decongestants.    Pertinent Lab Values: . 02/20/18:  Serum creatinine 0.9, BUN 9, Potassium 4.9, Sodium 146 from Dr. Loren Racer office  Vital Signs: . Weight: 159.2 lb (dry weight: 158 lb) . Blood pressure: 124/84 mmHg  . Heart rate: 102 bpm   Assessment: 1. Chronic systolic CHF (EF 56-25%), due to NICM (adriamycin-induced cardiotoxicity). NYHA class II symptoms.  - Volume status stable  - Start carvedilol 3.125 mg BID  - Continue furosemide 20 mg PRN weight gain/SOB, KCl 20 meq PRN with furosemide, Entresto 24-26 mg BID and spironolactone 12.5 mg daily  - Basic disease state pathophysiology, medication indication, mechanism and side effects reviewed at length with patient and she verbalized understanding  2. Malignant neoplasm of axillary tail of right breast in female, estrogen receptor positive  - Diagnosed with breast cancer 05/09/17. Underwent bilateral mastectomies on 06/05/17. Underwent axillary lymph node dissection on 06/26/17. Started chemo 07/22/17 with adriamycin and cytoxan x4, then taxol weekly x12. No finishing XRT on 02/09/18 - Echo 06/20/17: EF 55-60%, trivial TR. - Echo 6/19 EF 60-65% LS 11.90 GLS 18.7% - Echo 11/19 EF 25-30% suggestive of adriamycin cardiotoxicity  3. R pleural effusion - s/p chest tube drainage 11/19 - transudative. Cytology negative   Plan: 1) Medication changes: Based on clinical presentation, vital signs and recent labs will start carvedilol 3.125 mg PO BID  2) Labs: BMET today 3) Follow-up: cMRI next Wednesday and Dr. Haroldine Laws on 04/09/18   Ruta Hinds. Velva Harman, PharmD, BCPS, CPP Clinical Pharmacist Phone: [phone] 02/24/2018 8:44 AM

## 2018-02-26 ENCOUNTER — Other Ambulatory Visit (HOSPITAL_COMMUNITY): Payer: Self-pay | Admitting: Internal Medicine

## 2018-02-26 DIAGNOSIS — Z794 Long term (current) use of insulin: Secondary | ICD-10-CM | POA: Diagnosis not present

## 2018-02-26 DIAGNOSIS — E109 Type 1 diabetes mellitus without complications: Secondary | ICD-10-CM | POA: Diagnosis not present

## 2018-02-27 DIAGNOSIS — M9903 Segmental and somatic dysfunction of lumbar region: Secondary | ICD-10-CM | POA: Diagnosis not present

## 2018-02-27 DIAGNOSIS — M9904 Segmental and somatic dysfunction of sacral region: Secondary | ICD-10-CM | POA: Diagnosis not present

## 2018-02-27 DIAGNOSIS — M5136 Other intervertebral disc degeneration, lumbar region: Secondary | ICD-10-CM | POA: Diagnosis not present

## 2018-02-27 DIAGNOSIS — M62830 Muscle spasm of back: Secondary | ICD-10-CM | POA: Diagnosis not present

## 2018-03-03 DIAGNOSIS — M9903 Segmental and somatic dysfunction of lumbar region: Secondary | ICD-10-CM | POA: Diagnosis not present

## 2018-03-03 DIAGNOSIS — M5136 Other intervertebral disc degeneration, lumbar region: Secondary | ICD-10-CM | POA: Diagnosis not present

## 2018-03-03 DIAGNOSIS — M9904 Segmental and somatic dysfunction of sacral region: Secondary | ICD-10-CM | POA: Diagnosis not present

## 2018-03-03 DIAGNOSIS — M62830 Muscle spasm of back: Secondary | ICD-10-CM | POA: Diagnosis not present

## 2018-03-03 NOTE — Pre-Procedure Instructions (Signed)
Stephanie Holden  03/03/2018      [id number] [place], [place] - [address] [facility] [address] [place] [address] Phone: [phone] Fax: [phone]    Your procedure is scheduled on Wednesday, December 18th.  Report to [facility] Admitting at 11:30 A.M.  Call this number if you have problems the morning of surgery:  [phone]   Remember:  Do not eat after midnight.  You may drink clear liquids until 10:30 .  Clear liquids allowed are:  Water, Juice (non-citric and without pulp), Carbonated beverages, Clear Tea, Black Coffee only and Gatorade    Take these medicines the morning of surgery with A SIP OF WATER  carvedilol (COREG) oxyCODONE-acetaminophen (PERCOCET/ROXICET)-as needed. venlafaxine XR (EFFEXOR-XR)   7 days prior to surgery STOP taking any Aspirin(unless otherwise instructed by your surgeon), Aleve, Naproxen, Ibuprofen, Motrin, Advil, Goody's, BC's, all herbal medications, fish oil, and all vitamins and all supplements.    Patients with Insulin Pumps    . For patients with Insulin Pumps: o Contact your diabetes doctor for specific instructions before surgery. o Decrease basal insulin rates by 20% at midnight the night before surgery. o Note that if your surgery is planned to be longer than 2 hours, your insulin pump will be removed and intravenous (IV) insulin will be started and managed by the nurses and anesthesiologist. You will be able to restart your insulin pump once you are awake and able to manage it. o Make sure to bring insulin pump supplies to the hospital with you in case your site needs to be changed.   How to Manage Your Diabetes Before and After Surgery  Why is it important to control my blood sugar before and after surgery? . Improving blood sugar levels before and after surgery helps healing and can limit problems. . A way of improving blood sugar control is eating a healthy diet  by: o  Eating less sugar and carbohydrates o  Increasing activity/exercise o  Talking with your doctor about reaching your blood sugar goals . High blood sugars (greater than 180 mg/dL) can raise your risk of infections and slow your recovery, so you will need to focus on controlling your diabetes during the weeks before surgery. . Make sure that the doctor who takes care of your diabetes knows about your planned surgery including the date and location.  How do I manage my blood sugar before surgery? . Check your blood sugar at least 4 times a day, starting 2 days before surgery, to make sure that the level is not too high or low. o Check your blood sugar the morning of your surgery when you wake up and every 2 hours until you get to the Short Stay unit. . If your blood sugar is less than 70 mg/dL, you will need to treat for low blood sugar: o Do not take insulin. o Treat a low blood sugar (less than 70 mg/dL) with  cup of clear juice (cranberry or apple), 4 glucose tablets, OR glucose gel. o Recheck blood sugar in 15 minutes after treatment (to make sure it is greater than 70 mg/dL). If your blood sugar is not greater than 70 mg/dL on recheck, call [phone] for further instructions. . Report your blood sugar to the short stay nurse when you get to Short Stay.  . If you are admitted to the hospital after surgery: o Your blood sugar will be checked by the  staff and you will probably be given insulin after surgery (instead of oral diabetes medicines) to make sure you have good blood sugar levels. o The goal for blood sugar control after surgery is 80-180 mg/dL.     Do not wear jewelry, make-up or nail polish.  Do not wear lotions, powders, or perfumes, or deodorant.  Do not shave 48 hours prior to surgery.  Men may shave face and neck.  Do not bring valuables to the hospital.  [facility] is not responsible for any belongings or valuables.  Contacts, dentures or bridgework may not be  worn into surgery.  Leave your suitcase in the car.  After surgery it may be brought to your room.  For patients admitted to the hospital, discharge time will be determined by your treatment team.  Patients discharged the day of surgery will not be allowed to drive home.   Special instructions:   [place]- Preparing For Surgery  Before surgery, you can play an important role. Because skin is not sterile, your skin needs to be as free of germs as possible. You can reduce the number of germs on your skin by washing with CHG (chlorahexidine gluconate) Soap before surgery.  CHG is an antiseptic cleaner which kills germs and bonds with the skin to continue killing germs even after washing.    Oral Hygiene is also important to reduce your risk of infection.  Remember - BRUSH YOUR TEETH THE MORNING OF SURGERY WITH YOUR REGULAR TOOTHPASTE  Please do not use if you have an allergy to CHG or antibacterial soaps. If your skin becomes reddened/irritated stop using the CHG.  Do not shave (including legs and underarms) for at least 48 hours prior to first CHG shower. It is OK to shave your face.  Please follow these instructions carefully.   1. Shower the NIGHT BEFORE SURGERY and the MORNING OF SURGERY with CHG.   2. If you chose to wash your hair, wash your hair first as usual with your normal shampoo.  3. After you shampoo, rinse your hair and body thoroughly to remove the shampoo.  4. Use CHG as you would any other liquid soap. You can apply CHG directly to the skin and wash gently with a scrungie or a clean washcloth.   5. Apply the CHG Soap to your body ONLY FROM THE NECK DOWN.  Do not use on open wounds or open sores. Avoid contact with your eyes, ears, mouth and genitals (private parts). Wash Face and genitals (private parts)  with your normal soap.  6. Wash thoroughly, paying special attention to the area where your surgery will be performed.  7. Thoroughly rinse your body with warm water  from the neck down.  8. DO NOT shower/wash with your normal soap after using and rinsing off the CHG Soap.  9. Pat yourself dry with a CLEAN TOWEL.  10. Wear CLEAN PAJAMAS to bed the night before surgery, wear comfortable clothes the morning of surgery  11. Place CLEAN SHEETS on your bed the night of your first shower and DO NOT SLEEP WITH PETS.    Day of Surgery:  Do not apply any deodorants/lotions.  Please wear clean clothes to the hospital/surgery center.   Remember to brush your teeth WITH YOUR REGULAR TOOTHPASTE.   Please read over the following fact sheets that you were given.

## 2018-03-04 ENCOUNTER — Encounter (HOSPITAL_COMMUNITY)
Admission: RE | Admit: 2018-03-04 | Discharge: 2018-03-04 | Disposition: A | Discharge status: Home / Self Care | Payer: BLUE CROSS/BLUE SHIELD | Source: Ambulatory Visit | Attending: General Surgery | Admitting: General Surgery

## 2018-03-04 ENCOUNTER — Other Ambulatory Visit: Payer: Self-pay

## 2018-03-04 ENCOUNTER — Encounter (HOSPITAL_COMMUNITY): Payer: Self-pay

## 2018-03-04 ENCOUNTER — Ambulatory Visit (HOSPITAL_COMMUNITY)
Admission: RE | Admit: 2018-03-04 | Discharge: 2018-03-04 | Disposition: A | Discharge status: Home / Self Care | Payer: BLUE CROSS/BLUE SHIELD | Source: Ambulatory Visit | Attending: Internal Medicine | Admitting: Internal Medicine

## 2018-03-04 DIAGNOSIS — I5020 Unspecified systolic (congestive) heart failure: Secondary | ICD-10-CM

## 2018-03-04 HISTORY — DX: Heart failure, unspecified: I50.9

## 2018-03-04 LAB — HEMOGLOBIN A1C
Hgb A1c MFr Bld: 7.1 % — ABNORMAL HIGH (ref 4.8–5.6)
Mean Plasma Glucose: 157.07 mg/dL

## 2018-03-04 LAB — GLUCOSE, CAPILLARY: GLUCOSE-CAPILLARY: 164 mg/dL — AB (ref 70–99)

## 2018-03-04 LAB — BASIC METABOLIC PANEL
Anion gap: 13 (ref 5–15)
BUN: 11 mg/dL (ref 6–20)
CO2: 24 mmol/L (ref 22–32)
Calcium: 9.4 mg/dL (ref 8.9–10.3)
Chloride: 105 mmol/L (ref 98–111)
Creatinine, Ser: 0.78 mg/dL (ref 0.44–1.00)
GFR calc Af Amer: >60 mL/min (ref >60)
Glucose, Bld: 128 mg/dL — ABNORMAL HIGH (ref 70–99)
Potassium: 5.1 mmol/L (ref 3.5–5.1)
Sodium: 142 mmol/L (ref 135–145)

## 2018-03-04 LAB — CBC
HCT: 41.6 % (ref 36.0–46.0)
Hemoglobin: 12.6 g/dL (ref 12.0–15.0)
MCH: 25.7 pg — ABNORMAL LOW (ref 26.0–34.0)
MCHC: 30.3 g/dL (ref 30.0–36.0)
MCV: 84.9 fL (ref 80.0–100.0)
PLATELETS: 283 10*3/uL (ref 150–400)
RBC: 4.9 MIL/uL (ref 3.87–5.11)
RDW: 17.2 % — ABNORMAL HIGH (ref 11.5–15.5)
WBC: 4.7 10*3/uL (ref 4.0–10.5)
nRBC: 0 % (ref 0.0–0.2)

## 2018-03-04 MED ORDER — HEPARIN SOD (PORK) LOCK FLUSH 100 UNIT/ML IV SOLN
500.0000 [IU] | INTRAVENOUS | Status: AC | PRN
  Administered 2018-03-04: 500 [IU]

## 2018-03-04 MED ORDER — GADOBUTROL 1 MMOL/ML IV SOLN
7.0000 mL | Freq: Once | INTRAVENOUS | Status: AC | PRN
  Administered 2018-03-04: 7 mL via INTRAVENOUS

## 2018-03-04 NOTE — Progress Notes (Signed)
PCP - Dr. Alfonso Patten. Tisovec  Cardiologist - Dr. Haroldine Laws  Chest x-ray - 01/27/18 (E)  EKG - 01/27/18 (E)  Stress Test - Denies  ECHO - 01/28/18 (E)  Cardiac Cath - Denies  AICD- na PM- na LOOP- na  Sleep Study - Denies CPAP - None  LABS- 03/04/18: CBC, BMP  ASA- Denies  HA1C- 03/04/18 Fasting Blood Sugar - 120-140, today 164 Checks Blood Sugar _4-6____ times a day- pt has an insulin pump in place. Diabetes coordinator notified.  Anesthesia- Yes- cardiac history  Pt denies having chest pain, sob, or fever at this time. All instructions explained to the pt, with a verbal understanding of the material. Pt agrees to go over the instructions while at home for a better understanding. The opportunity to ask questions was provided.

## 2018-03-05 ENCOUNTER — Ambulatory Visit: Payer: BLUE CROSS/BLUE SHIELD | Attending: Radiation Oncology

## 2018-03-05 ENCOUNTER — Other Ambulatory Visit (HOSPITAL_COMMUNITY): Payer: Self-pay | Admitting: Pharmacist

## 2018-03-05 ENCOUNTER — Encounter (HOSPITAL_COMMUNITY): Payer: Self-pay

## 2018-03-05 DIAGNOSIS — M6281 Muscle weakness (generalized): Secondary | ICD-10-CM | POA: Insufficient documentation

## 2018-03-05 DIAGNOSIS — M5136 Other intervertebral disc degeneration, lumbar region: Secondary | ICD-10-CM | POA: Diagnosis not present

## 2018-03-05 DIAGNOSIS — I972 Postmastectomy lymphedema syndrome: Secondary | ICD-10-CM | POA: Insufficient documentation

## 2018-03-05 DIAGNOSIS — M9903 Segmental and somatic dysfunction of lumbar region: Secondary | ICD-10-CM | POA: Diagnosis not present

## 2018-03-05 DIAGNOSIS — M62830 Muscle spasm of back: Secondary | ICD-10-CM | POA: Diagnosis not present

## 2018-03-05 DIAGNOSIS — R293 Abnormal posture: Secondary | ICD-10-CM | POA: Diagnosis not present

## 2018-03-05 DIAGNOSIS — Z483 Aftercare following surgery for neoplasm: Secondary | ICD-10-CM | POA: Diagnosis not present

## 2018-03-05 DIAGNOSIS — M9904 Segmental and somatic dysfunction of sacral region: Secondary | ICD-10-CM | POA: Diagnosis not present

## 2018-03-05 MED ORDER — SACUBITRIL-VALSARTAN 24-26 MG PO TABS: 1.0000 | ORAL_TABLET | Freq: Two times a day (BID) | ORAL | 11 refills | Status: DC

## 2018-03-05 NOTE — Progress Notes (Signed)
Anesthesia Chart Review:  Case:  [id number] Date/Time:  03/11/18 1315   Procedures:      REMOVAL PORT-A-CATH (N/A )     EXCISION LEFT CHEST WALL MASS (Left )   Anesthesia type:  General   Pre-op diagnosis:  RIGHT BREAST CANCER, LEFT CHEST WALL MASS   Location:  [facility] OR ROOM 02 / [facility]   Surgeon:  [name], MD    OR room booked: 1330-1515   DISCUSSION: Patient is a 42 year old female scheduled for the above procedure.   History includes DM1 (insulin pump), thyroid goiter, right breast cancer (diagnosed 05/09/17, s/p bilateral mastectomies 06/05/17, axillary LN dissection-total 6/22 positive 06/26/17; chemotherapy 07/22/17-12/09/17; XRT 12/30/17-02/09/18). [facility] admission 53/2/99-24/26/83 for acute systolic CHF with right pleural effusion (s/p chest tube 01/27/18), suspected related to Adriamycin. EF 25-30%, down from 60-65% 08/2017.  [facility] admission 01/13/18-01/16/18 for healthcare associated pneumonia with moderate to large right pleural effusion (s/p thoracentesis 01/13/18).  She is followed by Dr. Haroldine Laws in the cardio-oncology clinic. Last seen 02/06/18. As of 02/25/18, she had started walking again without dyspnea, no chest pain, edema, orthopnea, or PND.   I have communicated with Dr. Haroldine Laws regarding upcoming surgery plans. He feels she is "ok to proceed." I am unsure if Dr. Barry Dienes is aware of patient's hospitalization last month, so I did send her a staff message reviewing events and relaying Dr. Clayborne Dana preoperative input.  If no acute changes then I anticipate that she can proceed as planned. She is for urine pregnancy test on the day of surgery. She has an insulin pump.   VS: BP 112/72   Pulse 95   Temp 36.5 C   Resp 20   Ht 5\' 2"  (1.575 m)   Wt 69.2 kg   SpO2 99%   BMI 27.91 kg/m   PROVIDERS: T[place], Fransico Him, MD is PCP - Glori Bickers, MD is HF cardiologist. Last visit 02/06/18. Given time course and her age he felt drop in EF was almost certainly  adriamycin-induced cardiotoxicity. Volume status was okay. He sent her to PharmD for medication titration (seen 02/24/18 and started on Coreg). Cardiac MRI ordered which showed no definitive evidence or prior MI, infiltrative disease, or myocarditis. Next visit 04/09/18.  Nicholas Lose, MD is HEM-ONC. Last visit 02/09/18.  Eppie Gibson, MD is RAD-ONC.    LABS: Labs reviewed: Acceptable for surgery. (all labs ordered are listed, but only abnormal results are displayed)  Labs Reviewed  GLUCOSE, CAPILLARY - Abnormal; Notable for the following components:      Result Value   Glucose-Capillary 164 (*)    All other components within normal limits  BASIC METABOLIC PANEL - Abnormal; Notable for the following components:   Glucose, Bld 128 (*)    All other components within normal limits  CBC - Abnormal; Notable for the following components:   MCH 25.7 (*)    RDW 17.2 (*)    All other components within normal limits  HEMOGLOBIN A1C - Abnormal; Notable for the following components:   Hgb A1c MFr Bld 7.1 (*)    All other components within normal limits    IMAGES: 1V CXR 01/30/18: IMPRESSION: Right chest tube completely removed. No change in the tiny amount of pleural air at the pleural apex. Airspace shadowing on the right is slightly improved. No worsening or new findings.   EKG: 02/06/18 ([facility]): ST at 101 bpm. T wave abnormality, consider inferior ischemia.    CV: MRI cardiac 03/04/18: IMPRESSION:  1.  Normal LV size with moderate diffuse hypokinesis, EF 37%. 2.  Normal RV size with mildly decreased systolic function, EF 34%. 3. No myocardial LGE, so no definitive evidence for prior myocardial infarction, infiltrative disease, or myocarditis.  Echo 01/28/18: Study Conclusions - Procedure narrative: Transthoracic echocardiography. Image   quality was poor. The study was technically difficult, as a   result of poor sound wave transmission. Intravenous contrast    (Definity) was administered. - Left ventricle: The cavity size was normal. Wall thickness was   normal. Systolic function was severely reduced. The estimated   ejection fraction was in the range of 25% to 30%. Diffuse   hypokinesis. Features are consistent with a pseudonormal left   ventricular filling pattern, with concomitant abnormal relaxation   and increased filling pressure (grade 2 diastolic dysfunction). - Aortic valve: There was no stenosis. - Mitral valve: Mildly calcified annulus. There was mild   regurgitation. - Left atrium: The atrium was mildly dilated. - Right ventricle: The cavity size was normal. Systolic function   was mildly reduced. - Pulmonary arteries: PA peak pressure: 36 mm Hg (S). - Systemic veins: IVC measured 1.8 cm with < 50% respirophasic   variation, suggesting RA pressure 8 mmHg. - Pericardium, extracardiac: There is a pleural effusion. A trivial   pericardial effusion was identified posterior to the heart. Impressions: - Normal LV size with EF 25-30%, diffuse hypokinesis. Moderate   diastolic dysfunction. Normal RV size with mildly decreased   systolic function. Mild MR. Marked change in EF compared to prior   study. (Comparison. 06/20/17: EF 55-60%; 09/19/17: 60-65%.)   Past Medical History:  Diagnosis Date  . Cancer [facility])    Breast cancer - right  . CHF (congestive heart failure) ([place])   . Constipation   . Depression   . Genetic testing 05/27/2017   Breast/GYN panel (23 genes) @ [facility] - No pathogenic mutations detected  . GERD (gastroesophageal reflux disease)   . Insulin dependent type 1 diabetes mellitus ([place])   . Insulin pump in place   . PONV (postoperative nausea and vomiting)   . Stenosing tenosynovitis of thumb 06/2014   right  . Thyroid goiter     Past Surgical History:  Procedure Laterality Date  . AXILLARY LYMPH NODE DISSECTION Right 06/26/2017   Procedure: AXILLARY LYMPH NODE DISSECTION;  Surgeon: [name], MD;  Location:  [place];  Service: General;  Laterality: Right;  . CARPAL TUNNEL RELEASE Bilateral 10/28/2013   Procedure: BILATERAL CARPAL TUNNEL RELEASE;  Surgeon: Daryll Brod, MD;  Location: [place];  Service: Orthopedics;  Laterality: Bilateral;  . CESAREAN SECTION  2000; 08/20/2001  . DILATION AND CURETTAGE OF UTERUS    . ESSURE TUBAL LIGATION  2005   failed, tubal puncture  . LAPAROSCOPIC TUBAL LIGATION  04/02/2007   removal Essure  . LAPAROSCOPIC UNILATERAL SALPINGECTOMY  2005  . MASTECTOMY W/ SENTINEL NODE BIOPSY Bilateral 06/05/2017   Procedure: BILATERAL MASTECTOMIES  WITH RIGHT SENTINEL LYMPH NODE BIOPSY;  Surgeon: [name], MD;  Location: [place];  Service: General;  Laterality: Bilateral;  . PORTACATH PLACEMENT Left 06/26/2017   Procedure: INSERTION PORT-A-CATH;  Surgeon: [name], MD;  Location: [place];  Service: General;  Laterality: Left;  . TRIGGER FINGER RELEASE Left 04/12/2014   Procedure: RELEASE A-1 PULLEY LEFT THUMB;  Surgeon: Daryll Brod, MD;  Location: [place];  Service: Orthopedics;  Laterality: Left;  . TRIGGER FINGER RELEASE Right 07/19/2014   Procedure: RELEASE TRIGGER FINGER/A-1  PULLEY RIGHT THUMB;  Surgeon: Daryll Brod, MD;  Location: [place];  Service: Orthopedics;  Laterality: Right;  . TUBAL LIGATION      MEDICATIONS: . carvedilol (COREG) 3.125 MG tablet  . furosemide (LASIX) 40 MG tablet  . gabapentin (NEURONTIN) 300 MG capsule  . hyaluronate sodium (RADIAPLEXRX) GEL  . insulin aspart (NOVOLOG) 100 UNIT/ML injection  . oxyCODONE-acetaminophen (PERCOCET/ROXICET) 5-325 MG tablet  . potassium chloride SA (K-DUR,KLOR-CON) 20 MEQ tablet  . Probiotic Product (PROBIOTIC DAILY PO)  . sacubitril-valsartan (ENTRESTO) 24-26 MG  . spironolactone (ALDACTONE) 25 MG tablet  . [START ON 03/26/2018] tamoxifen (NOLVADEX) 20 MG tablet  . venlafaxine XR (EFFEXOR-XR) 75 MG 24 hr capsule   No current facility-administered medications for this  encounter.   PAT RN notified DM coordinator that patient in on an insulin pump.  [facility] Short Stay [facility]/Anesthesiology Phone [phone] 03/05/2018 3:33 PM

## 2018-03-05 NOTE — Therapy (Addendum)
[place], [place], [address] Phone: [phone]   Fax:  [phone]  Physical Therapy Treatment  Patient Details  Name: Stephanie Holden MRN: [id number] Date of Birth: 1975-08-26 Referring Provider (PT): Dr. Eppie Gibson   Encounter Date: 03/05/2018  PT End of Session - 03/05/18 1005    Visit Number  6    Number of Visits  12    Date for PT Re-Evaluation  03/05/18    PT Start Time  0934    PT Stop Time  1012    PT Time Calculation (min)  38 min    Activity Tolerance  Patient tolerated treatment well    Behavior During Therapy  [facility] for tasks assessed/performed       Past Medical History:  Diagnosis Date  . Cancer [facility])    Breast cancer - right  . CHF (congestive heart failure) ([place])   . Constipation   . Depression   . Genetic testing 05/27/2017   Breast/GYN panel (23 genes) @ [facility] - No pathogenic mutations detected  . GERD (gastroesophageal reflux disease)   . Insulin dependent type 1 diabetes mellitus ([place])   . Insulin pump in place   . PONV (postoperative nausea and vomiting)   . Stenosing tenosynovitis of thumb 06/2014   right  . Thyroid goiter     Past Surgical History:  Procedure Laterality Date  . AXILLARY LYMPH NODE DISSECTION Right 06/26/2017   Procedure: AXILLARY LYMPH NODE DISSECTION;  Surgeon: [name], MD;  Location: [place];  Service: General;  Laterality: Right;  . CARPAL TUNNEL RELEASE Bilateral 10/28/2013   Procedure: BILATERAL CARPAL TUNNEL RELEASE;  Surgeon: Daryll Brod, MD;  Location: [place];  Service: Orthopedics;  Laterality: Bilateral;  . CESAREAN SECTION  2000; 08/20/2001  . DILATION AND CURETTAGE OF UTERUS    . ESSURE TUBAL LIGATION  2005   failed, tubal puncture  . LAPAROSCOPIC TUBAL LIGATION  04/02/2007   removal Essure  . LAPAROSCOPIC UNILATERAL SALPINGECTOMY  2005  . MASTECTOMY W/ SENTINEL NODE BIOPSY Bilateral 06/05/2017   Procedure:  BILATERAL MASTECTOMIES  WITH RIGHT SENTINEL LYMPH NODE BIOPSY;  Surgeon: [name], MD;  Location: [place];  Service: General;  Laterality: Bilateral;  . PORTACATH PLACEMENT Left 06/26/2017   Procedure: INSERTION PORT-A-CATH;  Surgeon: [name], MD;  Location: [place];  Service: General;  Laterality: Left;  . TRIGGER FINGER RELEASE Left 04/12/2014   Procedure: RELEASE A-1 PULLEY LEFT THUMB;  Surgeon: Daryll Brod, MD;  Location: [place];  Service: Orthopedics;  Laterality: Left;  . TRIGGER FINGER RELEASE Right 07/19/2014   Procedure: RELEASE TRIGGER FINGER/A-1 PULLEY RIGHT THUMB;  Surgeon: Daryll Brod, MD;  Location: [place];  Service: Orthopedics;  Laterality: Right;  . TUBAL LIGATION      There were no vitals filed for this visit.  Subjective Assessment - 03/05/18 0940    Subjective  My arm is fluctuating some still with the lymphedema. I'm doing the self manual lymph drainage daily, but not wearing my sleeve much. I do feel like my endurance is improving daily, especially now that we know I have CHF.     Pertinent History  Patient was diagnosed on 05/08/17 with right invasive ductal carcinoma breast cancer. She was seen in 5/18 and found to have a right axillary mass but did not f/u for further testing. She then had a mammogram in 2/19 and was diagnosed with right breast cancer which had  metastasized to the axillary lymph nodes. She had multiple small masses in the lower outer quadrant of her right breast with the largest mass measuring 1.3 cm. It is ER/PR positive and HER2 negative with a Ki67 of 2%. Patient underwent a bilateral mastectomy with right targeted node dissection with 4/7 positive on 06/05/17 for right invasive ductal carcinoma. She then underwent a right axillary node dissection on 06/26/17 with 2/20 positive nodes. Chemo completed 12/09/17 and began radiation 12/30/17.    Patient Stated Goals  Reduce swelling    Currently in Pain?  No/denies             LYMPHEDEMA/ONCOLOGY QUESTIONNAIRE - 03/05/18 0948      Right Upper Extremity Lymphedema   15 cm Proximal to Olecranon Process  34.2 cm    10 cm Proximal to Olecranon Process  32.8 cm    Olecranon Process  24.5 cm    10 cm Proximal to Ulnar Styloid Process  22.9 cm    Just Proximal to Ulnar Styloid Process  15 cm    Across Hand at [employer]  16.7 cm    At [place] of 2nd Digit  5.9 cm           Outpatient Rehab from 01/01/2018 in [place]  Lymphedema Life Impact Scale Total Score  23.53 %           [facility] Adult PT Treatment/Exercise - 03/05/18 0001      Self-Care   Other Self-Care Comments   Spent time answering pts questions how to safely progress tolerance to exerciseand walking. Also reviewed importance of staying consistent with maintenance phase of treatment with lymphedema and she verbalized understanding all of this.      Knee/Hip Exercises: Aerobic   Tread Mill  up to 2.4 mph x 1/2 mile in 13 mins. PTA present to monitor pt for fatigue               PT Short Term Goals - 01/01/18 1100      PT SHORT TERM GOAL #1   Title  Patient will verbalize where and how to be fitted for a night time compression garment.    Time  3    Period  Weeks    Status  New      PT SHORT TERM GOAL #2   Title  Patient will decease right arm swelling at 10 cm proximal to her ulnar styloid process to be </= 24 cm.    Baseline  25 cm    Time  3    Period  Weeks    Status  New      PT SHORT TERM GOAL #3   Title  Patient will decease right arm swelling at her olecranon to be </=26 cm.    Baseline  26.9    Time  3    Period  Weeks    Status  New        PT Long Term Goals - 03/05/18 1006      PT LONG TERM GOAL #1   Title  Patient will decease right arm swelling at 10 cm proximal to her ulnar styloid process to be </= 23 cm.    Baseline  25 cm; 22.5 cm-02/05/18; 22.9 cm- 03/05/18    Status  Achieved      PT LONG TERM  GOAL #2   Title  Patient will decease right arm swelling at her olecranon to be </=25 cm.    Baseline  26.9 cm; 24.1 cm - 02/05/18; 24.5 cm-03/05/18    Status  Achieved      PT LONG TERM GOAL #3   Title  Patient will verbalize good understanding of the Maintenance Phase of treatment for lymphedema.    Status  Achieved      PT LONG TERM GOAL #4   Title  Patient will demonstrate proper technique for performing self manual lymph drainage.    Baseline  Pt reports feeling independent with this but still working on lightening her pressure-02/05/18; pt independent with this now-03/05/18    Status  Achieved      PT LONG TERM GOAL #5   Title  Patient will report or demonstrate she is able to walk 1 mile in </= 20 miles for normal speed with daily tasks.    Baseline  Patient currently able to walk 1 mile in 27 minutes; pt was able to walk 1/2 mi in 13 mins so some improvement-03/05/18    Status  Partially Met      PT LONG TERM GOAL #6   Title  Patient will report she is able to stand for >/= 30 minutes to do normal daily tasks without needing to rest.    Baseline  Pt reports able to do this now-03/05/18    Status  Achieved            Plan - 03/05/18 1021    Clinical Impression Statement  Pt has done well and met all goals except being able to walk 1 mi in 2o mins but she reports feeling good progress with this. She is ready for D/C.    Rehab Potential  Excellent    Clinical Impairments Affecting Rehab Potential  Radiation to right axilla may worsen lymphedema    PT Frequency  2x / week    PT Duration  6 weeks    PT Treatment/Interventions  ADLs/Self Care Home Management;Therapeutic activities;Therapeutic exercise;Patient/family education;Manual techniques;Manual lymph drainage;Scar mobilization;Passive range of motion;DME Instruction    PT Next Visit Plan  D/C this visit.    Consulted and Agree with Plan of Care  Patient       Patient will benefit from skilled therapeutic  intervention in order to improve the following deficits and impairments:  Postural dysfunction, Decreased knowledge of precautions, Impaired UE functional use, Pain, Increased edema, Decreased range of motion, Decreased mobility, Decreased strength  Visit Diagnosis: Abnormal posture  Aftercare following surgery for neoplasm  Postmastectomy lymphedema  Muscle weakness (generalized)     Problem List Patient Active Problem List   Diagnosis Date Noted  . Acute respiratory failure with hypoxia ([place]) 01/27/2018  . Anemia 01/27/2018  . Chest tube in place   . HCAP (healthcare-associated pneumonia) 01/13/2018  . Pleural effusion 01/13/2018  . Breast cancer metastasized to axillary lymph node, right ([place]) 06/05/2017  . Genetic testing 05/27/2017  . Malignant neoplasm of axillary tail of right breast in female, estrogen receptor positive ([place]) 05/09/2017  . Cubital tunnel syndrome on left 10/02/2015  . Trigger finger, left index finger 04/12/2015  . Erroneous encounter - disregard 09/22/2014  . Diabetes type 1, controlled ([place]) 01/12/2013    Otelia Limes, PTA 03/05/2018, 10:24 AM  [place], [place], [address] Phone: [phone]   Fax:  [phone]  Name: ALICA SHELLHAMMER MRN: [id number] Date of Birth: Aug 13, 1975   PHYSICAL THERAPY DISCHARGE SUMMARY  Visits from Start of Care: 6  Current functional level related to goals / functional  outcomes: All goals met except the one related ot being able to walk at a faster rate. She is making progress but is limited by her recent diagnosis of CHF but this is improving.   Remaining deficits: Endurance   Education / Equipment: HEP  Plan: Patient agrees to discharge.  Patient goals were partially met. Patient is being discharged due to being pleased with the current functional level.  ?????         Annia Friendly, [place] 03/05/18 10:59  AM

## 2018-03-05 NOTE — Anesthesia Preprocedure Evaluation (Addendum)
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%   Neuro/Psych Depression negative neurological ROS     GI/Hepatic negative GI ROS, Neg liver ROS,   Endo/Other  diabetes (glu 87, insulin pump), Insulin Dependent  Renal/GU negative Renal ROS     Musculoskeletal   Abdominal   Peds  Hematology   Anesthesia Other Findings Breast cancer  Reproductive/Obstetrics                           Anesthesia Physical Anesthesia Plan  ASA: III  Anesthesia Plan: General   Post-op Pain Management:    Induction: Intravenous  PONV Risk Score and Plan: 4 or greater and Scopolamine patch - Pre-op, Dexamethasone and Ondansetron  Airway Management Planned: LMA  Additional Equipment:   Intra-op Plan:   Post-operative Plan:   Informed Consent: I have reviewed the patients History and Physical, chart, labs and discussed the procedure including the risks, benefits and alternatives for the proposed anesthesia with the patient or authorized representative who has indicated his/her understanding and acceptance.   Dental advisory given  Plan Discussed with: CRNA and Surgeon  Anesthesia Plan Comments: (PAT note written 03/05/2018 by Myra Gianotti, PA-C. )       Anesthesia Quick Evaluation

## 2018-03-06 ENCOUNTER — Other Ambulatory Visit: Payer: Self-pay

## 2018-03-06 MED ORDER — OXYCODONE-ACETAMINOPHEN 5-325 MG PO TABS: 1.0000 | ORAL_TABLET | Freq: Three times a day (TID) | ORAL | 0 refills | Status: DC | PRN

## 2018-03-06 NOTE — Progress Notes (Signed)
Spoke with pt and needs refill on her percocet pain medication. She will stop by Monday to pick it up. No further needs at this time.

## 2018-03-09 ENCOUNTER — Encounter: Payer: Self-pay | Admitting: Radiation Oncology

## 2018-03-09 NOTE — Progress Notes (Signed)
[name] presents for follow up of radiation completed 02/10/18 to her Right Chest wall, Right SCV, PAB nodes. She last saw Dr. Lindi Adie on 02/09/18 and will start Tamoxifen in January. She will see survivorship on 07/13/18. Her radiation site has healed well from radiation. She is not using lotion to this site at this time. She was advised to use a vitamin E containing lotion.  She has had recent surgery to her left chest and there is fluid present to that site.   BP 113/82 (BP Location: Left Arm, Patient Position: Sitting)   Pulse 68   Temp 97.9 F (36.6 C) (Oral)   Resp 18   Ht 5\' 2"  (1.575 m)   Wt 160 lb 2 oz (72.6 kg)   SpO2 100%   BMI 29.29 kg/m    Wt Readings from Last 3 Encounters:  03/13/18 160 lb 2 oz (72.6 kg)  03/11/18 152 lb 9.6 oz (69.2 kg)  03/04/18 152 lb 9.6 oz (69.2 kg)

## 2018-03-10 DIAGNOSIS — M5136 Other intervertebral disc degeneration, lumbar region: Secondary | ICD-10-CM | POA: Diagnosis not present

## 2018-03-10 DIAGNOSIS — M9903 Segmental and somatic dysfunction of lumbar region: Secondary | ICD-10-CM | POA: Diagnosis not present

## 2018-03-10 DIAGNOSIS — M9904 Segmental and somatic dysfunction of sacral region: Secondary | ICD-10-CM | POA: Diagnosis not present

## 2018-03-10 DIAGNOSIS — M62830 Muscle spasm of back: Secondary | ICD-10-CM | POA: Diagnosis not present

## 2018-03-10 NOTE — H&P (Signed)
Stephanie Holden Location: [facility] Patient #: [id number] DOB: 1976-01-29 Married / Language: English / Race: White Female   History of Present Illness The patient is a 42 year old female who presents for a follow-up for Breast cancer. Pt is a 42 yo F diagnosed with right breast cancer 04/2017. She had a painful mass in her right axilla last may and got referred for an ultrasound. However, by the time she went for imaging, it had resolved. She states that it was associated with her pre menstrual symptoms which include very painful breasts bilaterally and significant cramps. she felt like that was the most painful period she ever had. She was recommended to get short interval follow up in 6 weeks, but put it off because the node did not seem to come back. She noted it again just after christmas, but waited a full menstrual cycle before bringing it up to her gyn. She then underwent repeat dx mammogram/us. The lymph node was biopsied and was positive for cancer, however, nothing significant could be found in her right breast. Because of this, she had a breast MRI. Several small masses were seen wtih the largest being 8 mm, but the total together spanning 2.1 cm. However, significant non-mass enhancement was seen as well throughout a good portion of the right breast. Second look ultrasound found two areas to biopsy and these were both were grade 1 invasive ductal carcinoma, grade 1, +/+/-, Ki67 10%.    The patient discussed with her husband and research pros and cons of immediate reconstruction. She decided that since reconstruction was additional surgery, and she is a diabetic, that she would not want to proceed with that. An appointment was made with plastic surgery, but she decided to cancel it and proceed without recon. She desires bilateral mastectomies because her cancer was occult and not seen on mammogram or ultrasound. She presented with lymph node positive disease with  nothing on her imaging.   She had bilateral mastectomies and right SLN bx 06/05/2017. She had a low risk mammaprint, but had 4/7 LN positive. We went back for ALND and port 4/4. An additional 2/22 LN were positive.  She has completed chemo and is scheduled to start radiation 1 week from today. She tolerated chemo, but has been very tired and has significatn nail changes.   pathology for ALND 06/26/17 Diagnosis Lymph nodes, regional resection, right axillary - METASTATIC CARCINOMA IN 2 OF 22 LYMPH NODES (2/22).  pathology for bilat mastectomy and right sln bx 06/05/2017 Diagnosis 1. Breast, simple mastectomy, Left - FIBROCYSTIC CHANGES. - THERE IS NO EVIDENCE OF MALIGNANCY. 2. Breast, simple mastectomy, Right - INVASIVE DUCTAL CARCINOMA, GRADE I/III, THREE FOCI SPANNING 1.2 CM, 1.2 CM, AND 1.0 CM. - DUCTAL CARCINOMA IN SITU, INTERMEDIATE GRADE. - LYMPHOVASCULAR INVASION IS IDENTIFIED, DIFFUSE. - THE SURGICAL RESECTION MARGINS ARE NEGATIVE FOR CARCINOMA. - SEE ONCOLOGY TABLE BELOW. 3. Lymph node, sentinel, biopsy, Right axillary - METASTATIC CARCINOMA IN 1 OF 1 LYMPH NODE (1/1). 4. Lymph node, sentinel, biopsy, Right axillary - METASTATIC CARCINOMA IN 1 OF 1 LYMPH NODE (1/1) WITH EXTRACAPSULAR EXTENSION. 5. Lymph node, sentinel, biopsy, Right axillary - THERE IS NO EVIDENCE OF CARCINOMA IN 1 OF 1 LYMPH NODE (0/1). 6. Lymph node, sentinel, biopsy, Right axillary - METASTATIC CARCINOMA IN 1 OF 1 LYMPH NODE (1/1), WITH EXTRACAPSULAR EXTENSION. 7. Lymph node, sentinel, biopsy, Right axillary - METASTATIC CARCINOMA IN 1 OF 1 LYMPH NODE (1/1). 8. Lymph node, sentinel, biopsy, Right axillary -  THERE IS NO EVIDENCE OF CARCINOMA IN 1 OF 1 LYMPH NODE (0/1). 9. Lymph node, sentinel, biopsy, Right axillary - THERE IS NO EVIDENCE OF CARCINOMA IN 1 OF 1 LYMPH NODE (0/1).   Allergies  Dilaudid *ANALGESICS - OPIOID*  Allergies Reconciled   Medication History  Gabapentin (300MG Capsule,  1 (one) Oral daily, Taken starting 09/29/2017) Active. Tamoxifen Citrate (20MG Tablet, Oral) Active. Venlafaxine HCl ER (37.5MG Capsule ER 24HR, Oral) Active. Prochlorperazine Maleate (10MG Tablet, Oral) Active. Lidocaine-Prilocaine (2.5-2.5% Cream, External) Active. Dexamethasone (2MG Tablet, Oral) Active. LORazepam (0.5MG Tablet, Oral) Active. Methocarbamol (500MG Tablet, Oral) Active. Ondansetron HCl (8MG Tablet, Oral) Active. OxyCODONE HCl (5MG Tablet, Oral) Active. Novolin (Subcutaneous) Active. Lotensin (10MG Tablet, Oral) Active. Multiple Vitamin (1 (one) Oral) Active. Probiotic (Oral) Active. NovoLOG (100UNIT/ML Solution, Subcutaneous) Active. Medications Reconciled    Review of Systems  All other systems negative  Vitals Weight: 171.2 lb Height: 62in Body Surface Area: 1.79 m Body Mass Index: 31.31 kg/m  Pain Level: 7/10 Temp.: 97.59F(Temporal)  Pulse: 95 (Regular)  P.OX: 94% (Room air) BP: 148/90 (Sitting, Left Arm, Standard)       Physical Exam General Mental Status-Alert. General Appearance-Consistent with stated age. Hydration-Well hydrated. Voice-Normal.  Head and Neck Head-normocephalic, atraumatic with no lesions or palpable masses.  Eye Sclera/Conjunctiva - Bilateral-No scleral icterus.  Chest and Lung Exam Chest and lung exam reveals -quiet, even and easy respiratory effort with no use of accessory muscles. Inspection Chest Wall - Normal. Back - normal.  Breast Note: breasts surgically absent. Left chest wall has mass like tissue inferior to scar. this is mobile.   Cardiovascular Cardiovascular examination reveals -normal pedal pulses bilaterally. Note: regular rate and rhythm  Abdomen Inspection-Inspection Normal. Palpation/Percussion Palpation and Percussion of the abdomen reveal - Soft, Non Tender, No Rebound tenderness, No Rigidity (guarding) and No hepatosplenomegaly.  Peripheral  Vascular Upper Extremity Inspection - Bilateral - Normal - No Clubbing, No Cyanosis, No Edema, Pulses Intact. Lower Extremity Palpation - Edema - Bilateral - No edema.  Neurologic Neurologic evaluation reveals -alert and oriented x 3 with no impairment of recent or remote memory. Mental Status-Normal.  Musculoskeletal Global Assessment -Note: no gross deformities.  Normal Exam - Left-Upper Extremity Strength Normal and Lower Extremity Strength Normal. Normal Exam - Right-Upper Extremity Strength Normal and Lower Extremity Strength Normal.  Lymphatic Head & Neck  General Head & Neck Lymphatics: Bilateral - Description - Normal. Axillary  General Axillary Region: Bilateral - Description - Normal. Tenderness - Non Tender.    Assessment & Plan  BREAST CANCER METASTASIZED TO AXILLARY LYMPH NODE (C50.919) Impression: Will set up port removal and excision of left chest wall mass after radiation.  Mass most likely breast tissue under flaps that collapsed down on itself. Will plan excision.  REviewed surgery. Current Plans You are being scheduled for surgery- Our schedulers will call you.  You should hear from our office's scheduling department within 5 working days about the location, date, and time of surgery. We try to make accommodations for patient's preferences in scheduling surgery, but sometimes the OR schedule or the surgeon's schedule prevents [place] from making those accommodations.  If you have not heard from our office [phone]) in 5 working days, call the office and ask for your surgeon's nurse.  If you have other questions about your diagnosis, plan, or surgery, call the office and ask for your surgeon's nurse.    Signed by [name], MD

## 2018-03-11 ENCOUNTER — Encounter (HOSPITAL_COMMUNITY): Payer: Self-pay

## 2018-03-11 ENCOUNTER — Ambulatory Visit (HOSPITAL_COMMUNITY)
Admission: RE | Admit: 2018-03-11 | Discharge: 2018-03-11 | Disposition: A | Discharge status: Home / Self Care | Payer: BLUE CROSS/BLUE SHIELD | Attending: General Surgery | Admitting: General Surgery

## 2018-03-11 ENCOUNTER — Ambulatory Visit (HOSPITAL_COMMUNITY): Payer: BLUE CROSS/BLUE SHIELD | Admitting: Vascular Surgery

## 2018-03-11 ENCOUNTER — Encounter (HOSPITAL_COMMUNITY)
Admission: RE | Disposition: A | Discharge status: Home / Self Care | Payer: Self-pay | Source: Home / Self Care | Attending: General Surgery

## 2018-03-11 ENCOUNTER — Other Ambulatory Visit: Payer: Self-pay

## 2018-03-11 ENCOUNTER — Ambulatory Visit (HOSPITAL_COMMUNITY): Payer: BLUE CROSS/BLUE SHIELD | Admitting: Certified Registered Nurse Anesthetist

## 2018-03-11 DIAGNOSIS — Z9221 Personal history of antineoplastic chemotherapy: Secondary | ICD-10-CM | POA: Diagnosis not present

## 2018-03-11 DIAGNOSIS — E119 Type 2 diabetes mellitus without complications: Secondary | ICD-10-CM | POA: Insufficient documentation

## 2018-03-11 DIAGNOSIS — Z9013 Acquired absence of bilateral breasts and nipples: Secondary | ICD-10-CM | POA: Insufficient documentation

## 2018-03-11 DIAGNOSIS — I427 Cardiomyopathy due to drug and external agent: Secondary | ICD-10-CM | POA: Insufficient documentation

## 2018-03-11 DIAGNOSIS — Z79891 Long term (current) use of opiate analgesic: Secondary | ICD-10-CM | POA: Insufficient documentation

## 2018-03-11 DIAGNOSIS — C773 Secondary and unspecified malignant neoplasm of axilla and upper limb lymph nodes: Secondary | ICD-10-CM | POA: Diagnosis not present

## 2018-03-11 DIAGNOSIS — D171 Benign lipomatous neoplasm of skin and subcutaneous tissue of trunk: Secondary | ICD-10-CM | POA: Diagnosis not present

## 2018-03-11 DIAGNOSIS — R222 Localized swelling, mass and lump, trunk: Secondary | ICD-10-CM | POA: Diagnosis not present

## 2018-03-11 DIAGNOSIS — Z79899 Other long term (current) drug therapy: Secondary | ICD-10-CM | POA: Insufficient documentation

## 2018-03-11 DIAGNOSIS — Z87891 Personal history of nicotine dependence: Secondary | ICD-10-CM | POA: Insufficient documentation

## 2018-03-11 DIAGNOSIS — E109 Type 1 diabetes mellitus without complications: Secondary | ICD-10-CM | POA: Diagnosis not present

## 2018-03-11 DIAGNOSIS — Z452 Encounter for adjustment and management of vascular access device: Secondary | ICD-10-CM | POA: Insufficient documentation

## 2018-03-11 DIAGNOSIS — T451X5A Adverse effect of antineoplastic and immunosuppressive drugs, initial encounter: Secondary | ICD-10-CM | POA: Insufficient documentation

## 2018-03-11 DIAGNOSIS — Z794 Long term (current) use of insulin: Secondary | ICD-10-CM | POA: Insufficient documentation

## 2018-03-11 DIAGNOSIS — D649 Anemia, unspecified: Secondary | ICD-10-CM | POA: Diagnosis not present

## 2018-03-11 DIAGNOSIS — C50911 Malignant neoplasm of unspecified site of right female breast: Secondary | ICD-10-CM | POA: Insufficient documentation

## 2018-03-11 HISTORY — PX: PORT-A-CATH REMOVAL: SHX5289

## 2018-03-11 HISTORY — PX: MASS EXCISION: SHX2000

## 2018-03-11 LAB — GLUCOSE, CAPILLARY
GLUCOSE-CAPILLARY: 103 mg/dL — AB (ref 70–99)
GLUCOSE-CAPILLARY: 133 mg/dL — AB (ref 70–99)
GLUCOSE-CAPILLARY: 156 mg/dL — AB (ref 70–99)
Glucose-Capillary: 87 mg/dL (ref 70–99)

## 2018-03-11 LAB — POCT PREGNANCY, URINE: Preg Test, Ur: NEGATIVE

## 2018-03-11 SURGERY — REMOVAL PORT-A-CATH
Anesthesia: General | Site: Chest

## 2018-03-11 MED ORDER — GABAPENTIN 300 MG PO CAPS
300.0000 mg | ORAL_CAPSULE | ORAL | Status: AC
  Administered 2018-03-11: 300 mg via ORAL

## 2018-03-11 MED ORDER — EPHEDRINE SULFATE-NACL 50-0.9 MG/10ML-% IV SOSY
PREFILLED_SYRINGE | INTRAVENOUS | Status: DC | PRN
  Administered 2018-03-11: 10 mg via INTRAVENOUS

## 2018-03-11 MED ORDER — FENTANYL CITRATE (PF) 100 MCG/2ML IJ SOLN: 25.0000 ug | INTRAMUSCULAR | Status: DC | PRN

## 2018-03-11 MED ORDER — CHLORHEXIDINE GLUCONATE CLOTH 2 % EX PADS: 6.0000 | MEDICATED_PAD | Freq: Once | CUTANEOUS | Status: DC

## 2018-03-11 MED ORDER — SODIUM CHLORIDE 0.9 % IV SOLN
INTRAVENOUS | Status: DC | PRN
  Administered 2018-03-11: 30 ug/min via INTRAVENOUS

## 2018-03-11 MED ORDER — MIDAZOLAM HCL 5 MG/5ML IJ SOLN
INTRAMUSCULAR | Status: DC | PRN
  Administered 2018-03-11: 2 mg via INTRAVENOUS

## 2018-03-11 MED ORDER — ONDANSETRON HCL 4 MG/2ML IJ SOLN
INTRAMUSCULAR | Status: AC
  Filled 2018-03-11: qty 2

## 2018-03-11 MED ORDER — DEXTROSE 50 % IV SOLN
INTRAVENOUS | Status: DC | PRN
  Administered 2018-03-11: .5 via INTRAVENOUS

## 2018-03-11 MED ORDER — MEPERIDINE HCL 50 MG/ML IJ SOLN: 6.2500 mg | INTRAMUSCULAR | Status: DC | PRN

## 2018-03-11 MED ORDER — PROMETHAZINE HCL 25 MG/ML IJ SOLN: 6.2500 mg | INTRAMUSCULAR | Status: DC | PRN

## 2018-03-11 MED ORDER — LACTATED RINGERS IV SOLN
INTRAVENOUS | Status: DC
  Administered 2018-03-11: 12:00:00 via INTRAVENOUS

## 2018-03-11 MED ORDER — MIDAZOLAM HCL 2 MG/2ML IJ SOLN: 0.5000 mg | Freq: Once | INTRAMUSCULAR | Status: DC | PRN

## 2018-03-11 MED ORDER — GABAPENTIN 300 MG PO CAPS
ORAL_CAPSULE | ORAL | Status: AC
  Administered 2018-03-11: 300 mg via ORAL
  Filled 2018-03-11: qty 1

## 2018-03-11 MED ORDER — LIDOCAINE HCL 1 % IJ SOLN
INTRAMUSCULAR | Status: DC | PRN
  Administered 2018-03-11: 36 mL via INTRAMUSCULAR

## 2018-03-11 MED ORDER — DEXAMETHASONE SODIUM PHOSPHATE 10 MG/ML IJ SOLN
INTRAMUSCULAR | Status: DC | PRN
  Administered 2018-03-11: 10 mg via INTRAVENOUS

## 2018-03-11 MED ORDER — ACETAMINOPHEN 500 MG PO TABS
1000.0000 mg | ORAL_TABLET | ORAL | Status: AC
  Administered 2018-03-11: 1000 mg via ORAL

## 2018-03-11 MED ORDER — FENTANYL CITRATE (PF) 250 MCG/5ML IJ SOLN
INTRAMUSCULAR | Status: AC
  Filled 2018-03-11: qty 5

## 2018-03-11 MED ORDER — PROPOFOL 10 MG/ML IV BOLUS
INTRAVENOUS | Status: AC
  Filled 2018-03-11: qty 20

## 2018-03-11 MED ORDER — PHENYLEPHRINE 40 MCG/ML (10ML) SYRINGE FOR IV PUSH (FOR BLOOD PRESSURE SUPPORT)
PREFILLED_SYRINGE | INTRAVENOUS | Status: DC | PRN
  Administered 2018-03-11: 80 ug via INTRAVENOUS
  Administered 2018-03-11: 120 ug via INTRAVENOUS
  Administered 2018-03-11: 80 ug via INTRAVENOUS
  Administered 2018-03-11: 120 ug via INTRAVENOUS

## 2018-03-11 MED ORDER — LIDOCAINE 2% (20 MG/ML) 5 ML SYRINGE
INTRAMUSCULAR | Status: DC | PRN
  Administered 2018-03-11: 40 mg via INTRAVENOUS

## 2018-03-11 MED ORDER — FUROSEMIDE 10 MG/ML IJ SOLN
INTRAMUSCULAR | Status: AC
  Filled 2018-03-11: qty 4

## 2018-03-11 MED ORDER — SCOPOLAMINE 1 MG/3DAYS TD PT72
MEDICATED_PATCH | TRANSDERMAL | Status: AC
  Administered 2018-03-11: 1.5 mg via TRANSDERMAL
  Filled 2018-03-11: qty 1

## 2018-03-11 MED ORDER — LACTATED RINGERS IV SOLN
INTRAVENOUS | Status: DC | PRN
  Administered 2018-03-11: 12:00:00 via INTRAVENOUS

## 2018-03-11 MED ORDER — SCOPOLAMINE 1 MG/3DAYS TD PT72
1.0000 | MEDICATED_PATCH | Freq: Once | TRANSDERMAL | Status: DC
  Administered 2018-03-11: 1.5 mg via TRANSDERMAL

## 2018-03-11 MED ORDER — PROPOFOL 10 MG/ML IV BOLUS
INTRAVENOUS | Status: DC | PRN
  Administered 2018-03-11: 100 mg via INTRAVENOUS
  Administered 2018-03-11 (×2): 50 mg via INTRAVENOUS

## 2018-03-11 MED ORDER — 0.9 % SODIUM CHLORIDE (POUR BTL) OPTIME
TOPICAL | Status: DC | PRN
  Administered 2018-03-11: 1000 mL

## 2018-03-11 MED ORDER — CEFAZOLIN SODIUM-DEXTROSE 2-4 GM/100ML-% IV SOLN
2.0000 g | INTRAVENOUS | Status: AC
  Administered 2018-03-11: 2 g via INTRAVENOUS

## 2018-03-11 MED ORDER — LIDOCAINE 2% (20 MG/ML) 5 ML SYRINGE
INTRAMUSCULAR | Status: AC
  Filled 2018-03-11: qty 5

## 2018-03-11 MED ORDER — FUROSEMIDE 10 MG/ML IJ SOLN
10.0000 mg | Freq: Once | INTRAMUSCULAR | Status: AC
  Administered 2018-03-11: 10 mg via INTRAVENOUS

## 2018-03-11 MED ORDER — CEFAZOLIN SODIUM-DEXTROSE 2-4 GM/100ML-% IV SOLN
INTRAVENOUS | Status: AC
  Filled 2018-03-11: qty 100

## 2018-03-11 MED ORDER — PHENYLEPHRINE 40 MCG/ML (10ML) SYRINGE FOR IV PUSH (FOR BLOOD PRESSURE SUPPORT)
PREFILLED_SYRINGE | INTRAVENOUS | Status: AC
  Filled 2018-03-11: qty 10

## 2018-03-11 MED ORDER — DEXAMETHASONE SODIUM PHOSPHATE 10 MG/ML IJ SOLN
INTRAMUSCULAR | Status: AC
  Filled 2018-03-11: qty 1

## 2018-03-11 MED ORDER — ACETAMINOPHEN 500 MG PO TABS
ORAL_TABLET | ORAL | Status: AC
  Administered 2018-03-11: 1000 mg via ORAL
  Filled 2018-03-11: qty 2

## 2018-03-11 MED ORDER — ONDANSETRON HCL 4 MG/2ML IJ SOLN
INTRAMUSCULAR | Status: DC | PRN
  Administered 2018-03-11: 4 mg via INTRAVENOUS

## 2018-03-11 MED ORDER — DEXTROSE 50 % IV SOLN
INTRAVENOUS | Status: AC
  Filled 2018-03-11: qty 50

## 2018-03-11 MED ORDER — MIDAZOLAM HCL 2 MG/2ML IJ SOLN
INTRAMUSCULAR | Status: AC
  Filled 2018-03-11: qty 2

## 2018-03-11 MED ORDER — EPHEDRINE 5 MG/ML INJ
INTRAVENOUS | Status: AC
  Filled 2018-03-11: qty 10

## 2018-03-11 MED ORDER — FENTANYL CITRATE (PF) 250 MCG/5ML IJ SOLN
INTRAMUSCULAR | Status: DC | PRN
  Administered 2018-03-11: 25 ug via INTRAVENOUS
  Administered 2018-03-11: 50 ug via INTRAVENOUS

## 2018-03-11 SURGICAL SUPPLY — 52 items
BENZOIN TINCTURE PRP APPL 2/3 (GAUZE/BANDAGES/DRESSINGS) IMPLANT
BLADE SURG 15 STRL LF DISP TIS (BLADE) ×2 IMPLANT
BLADE SURG 15 STRL SS (BLADE) ×1
CANISTER SUCT 3000ML PPV (MISCELLANEOUS) ×3 IMPLANT
CHLORAPREP W/TINT 10.5 ML (MISCELLANEOUS) IMPLANT
CHLORAPREP W/TINT 26ML (MISCELLANEOUS) ×3 IMPLANT
COVER SURGICAL LIGHT HANDLE (MISCELLANEOUS) ×3 IMPLANT
COVER WAND RF STERILE (DRAPES) IMPLANT
DECANTER SPIKE VIAL GLASS SM (MISCELLANEOUS) ×3 IMPLANT
DERMABOND ADVANCED (GAUZE/BANDAGES/DRESSINGS) ×1
DERMABOND ADVANCED .7 DNX12 (GAUZE/BANDAGES/DRESSINGS) ×2 IMPLANT
DRAPE LAPAROSCOPIC ABDOMINAL (DRAPES) ×3 IMPLANT
DRAPE LAPAROTOMY 100X72 PEDS (DRAPES) IMPLANT
DRAPE UTILITY XL STRL (DRAPES) ×3 IMPLANT
DRSG TEGADERM 4X4.75 (GAUZE/BANDAGES/DRESSINGS) IMPLANT
ELECT CAUTERY BLADE 6.4 (BLADE) IMPLANT
ELECT REM PT RETURN 9FT ADLT (ELECTROSURGICAL) ×3
ELECTRODE REM PT RTRN 9FT ADLT (ELECTROSURGICAL) ×2 IMPLANT
GAUZE 4X4 16PLY RFD (DISPOSABLE) ×3 IMPLANT
GAUZE SPONGE 4X4 12PLY STRL (GAUZE/BANDAGES/DRESSINGS) IMPLANT
GLOVE BIO SURGEON STRL SZ 6 (GLOVE) ×3 IMPLANT
GLOVE BIOGEL PI IND STRL 7.5 (GLOVE) ×2 IMPLANT
GLOVE BIOGEL PI INDICATOR 7.5 (GLOVE) ×1
GLOVE ECLIPSE 7.5 STRL STRAW (GLOVE) ×3 IMPLANT
GLOVE INDICATOR 6.5 STRL GRN (GLOVE) ×3 IMPLANT
GOWN STRL REUS W/ TWL LRG LVL3 (GOWN DISPOSABLE) ×2 IMPLANT
GOWN STRL REUS W/TWL 2XL LVL3 (GOWN DISPOSABLE) ×3 IMPLANT
GOWN STRL REUS W/TWL LRG LVL3 (GOWN DISPOSABLE) ×1
KIT BASIN OR (CUSTOM PROCEDURE TRAY) ×3 IMPLANT
KIT TURNOVER KIT B (KITS) ×3 IMPLANT
NEEDLE HYPO 25GX1X1/2 BEV (NEEDLE) ×3 IMPLANT
NS IRRIG 1000ML POUR BTL (IV SOLUTION) ×3 IMPLANT
PACK SURGICAL SETUP 50X90 (CUSTOM PROCEDURE TRAY) ×3 IMPLANT
PAD ARMBOARD 7.5X6 YLW CONV (MISCELLANEOUS) ×6 IMPLANT
PENCIL BUTTON HOLSTER BLD 10FT (ELECTRODE) IMPLANT
PENCIL SMOKE EVACUATOR (MISCELLANEOUS) ×3 IMPLANT
SPECIMEN JAR SMALL (MISCELLANEOUS) ×3 IMPLANT
SPONGE LAP 18X18 X RAY DECT (DISPOSABLE) IMPLANT
SPONGE LAP 4X18 RFD (DISPOSABLE) ×3 IMPLANT
STRIP CLOSURE SKIN 1/2X4 (GAUZE/BANDAGES/DRESSINGS) IMPLANT
SUT MNCRL AB 4-0 PS2 18 (SUTURE) ×3 IMPLANT
SUT MON AB 4-0 PC3 18 (SUTURE) IMPLANT
SUT SILK 2 0 PERMA HAND 18 BK (SUTURE) IMPLANT
SUT VIC AB 3-0 SH 27 (SUTURE)
SUT VIC AB 3-0 SH 27X BRD (SUTURE) IMPLANT
SUT VIC AB 3-0 SH 8-18 (SUTURE) ×3 IMPLANT
SYR BULB 3OZ (MISCELLANEOUS) ×3 IMPLANT
SYR CONTROL 10ML LL (SYRINGE) ×3 IMPLANT
TOWEL OR 17X24 6PK STRL BLUE (TOWEL DISPOSABLE) ×3 IMPLANT
TOWEL OR 17X26 10 PK STRL BLUE (TOWEL DISPOSABLE) IMPLANT
TUBE CONNECTING 12X1/4 (SUCTIONS) ×3 IMPLANT
YANKAUER SUCT BULB TIP NO VENT (SUCTIONS) ×3 IMPLANT

## 2018-03-11 NOTE — Transfer of Care (Signed)
Immediate Anesthesia Transfer of Care Note  Patient: Stephanie Holden  Procedure(s) Performed: REMOVAL PORT-A-CATH (N/A Chest) EXCISION LEFT CHEST WALL MASS (Left Chest)  Patient Location: PACU  Anesthesia Type:General  Level of Consciousness: awake, alert  and oriented  Airway & Oxygen Therapy: Patient Spontanous Breathing  Post-op Assessment: Report given to RN, Post -op Vital signs reviewed and stable and Patient moving all extremities X 4  Post vital signs: Reviewed and stable  Last Vitals:  Vitals Value Taken Time  BP 119/79 03/11/2018  3:07 PM  Temp    Pulse 106 03/11/2018  3:09 PM  Resp 18 03/11/2018  3:09 PM  SpO2 90 % 03/11/2018  3:09 PM  Vitals shown include unvalidated device data.  Last Pain:  Vitals:   03/11/18 1154  TempSrc: Oral  PainSc:       Patients Stated Pain Goal: 3 (03/70/96 4383)  Complications: No apparent anesthesia complications

## 2018-03-11 NOTE — Progress Notes (Signed)
Spoke with Dr. Glennon Mac about patients insulin pump.  Instructions are to leave pump on at this time

## 2018-03-11 NOTE — Discharge Instructions (Addendum)
[facility] °Office Phone Number [phone] ° °BREAST BIOPSY/ PARTIAL MASTECTOMY: POST OP INSTRUCTIONS ° °Always review your discharge instruction sheet given to you by the facility where your surgery was performed. ° °IF YOU HAVE DISABILITY OR FAMILY LEAVE FORMS, YOU MUST BRING THEM TO THE OFFICE FOR PROCESSING.  DO NOT GIVE THEM TO YOUR DOCTOR. ° °1. A prescription for pain medication may be given to you upon discharge.  Take your pain medication as prescribed, if needed.  If narcotic pain medicine is not needed, then you may take acetaminophen (Tylenol) or ibuprofen (Advil) as needed. °2. Take your usually prescribed medications unless otherwise directed °3. If you need a refill on your pain medication, please contact your pharmacy.  They will contact our office to request authorization.  Prescriptions will not be filled after 5pm or on week-ends. °4. You should eat very light the first 24 hours after surgery, such as soup, crackers, pudding, etc.  Resume your normal diet the day after surgery. °5. Most patients will experience some swelling and bruising in the breast.  Ice packs and a good support bra will help.  Swelling and bruising can take several days to resolve.  °6. It is common to experience some constipation if taking pain medication after surgery.  Increasing fluid intake and taking a stool softener will usually help or prevent this problem from occurring.  A mild laxative (Milk of Magnesia or Miralax) should be taken according to package directions if there are no bowel movements after 48 hours. °7. Unless discharge instructions indicate otherwise, you may remove your bandages 48 hours after surgery, and you may shower at that time.  You may have steri-strips (small skin tapes) in place directly over the incision.  These strips should be left on the skin for 7-10 days.   Any sutures or staples will be removed at the office during your follow-up visit. °8. ACTIVITIES:  You may resume  regular daily activities (gradually increasing) beginning the next day.  Wearing a good support bra or sports bra (or the breast binder) minimizes pain and swelling.  You may have sexual intercourse when it is comfortable. °a. You may drive when you no longer are taking prescription pain medication, you can comfortably wear a seatbelt, and you can safely maneuver your car and apply brakes. °b. RETURN TO WORK:  __________1 week_______________ °9. You should see your doctor in the office for a follow-up appointment approximately two weeks after your surgery.  Your doctor’s nurse will typically make your follow-up appointment when she calls you with your pathology report.  Expect your pathology report 2-3 business days after your surgery.  You may call to check if you do not hear from us after three days. ° ° °WHEN TO CALL YOUR DOCTOR: °1. Fever over 101.0 °2. Nausea and/or vomiting. °3. Extreme swelling or bruising. °4. Continued bleeding from incision. °5. Increased pain, redness, or drainage from the incision. ° °The clinic staff is available to answer your questions during regular business hours.  Please don’t hesitate to call and ask to speak to one of the nurses for clinical concerns.  If you have a medical emergency, go to the nearest emergency room or call 911.  A surgeon from [facility] is always on call at the hospital. ° °For further questions, please visit [email or web address]  ° °

## 2018-03-11 NOTE — Anesthesia Postprocedure Evaluation (Signed)
Anesthesia Post Note  Patient: Stephanie Holden  Procedure(s) Performed: REMOVAL PORT-A-CATH (N/A Chest) EXCISION LEFT CHEST WALL MASS (Left Chest)     Patient location during evaluation: PACU Anesthesia Type: General Level of consciousness: awake and alert, patient cooperative and oriented Pain management: pain level controlled Vital Signs Assessment: post-procedure vital signs reviewed and stable Respiratory status: spontaneous breathing, nonlabored ventilation and respiratory function stable Cardiovascular status: blood pressure returned to baseline and stable Postop Assessment: no apparent nausea or vomiting and able to ambulate Anesthetic complications: no    Last Vitals:  Vitals:   03/11/18 1550 03/11/18 1605  BP: 124/85 114/72  Pulse: (!) 106 98  Resp: (!) 29 14  Temp:    SpO2: 92% 96%    Last Pain:  Vitals:   03/11/18 1605  TempSrc:   PainSc: 0-No pain                 Mikal Blasdell,E. Manie Bealer

## 2018-03-11 NOTE — Anesthesia Procedure Notes (Signed)
Procedure Name: LMA Insertion Date/Time: 03/11/2018 1:42 PM Performed by: Harden Mo, CRNA Pre-anesthesia Checklist: Patient identified, Emergency Drugs available, Suction available and Patient being monitored Patient Re-evaluated:Patient Re-evaluated prior to induction Oxygen Delivery Method: Circle System Utilized Preoxygenation: Pre-oxygenation with 100% oxygen Induction Type: IV induction Ventilation: Mask ventilation without difficulty LMA: LMA inserted LMA Size: 4.0 Number of attempts: 1 Airway Equipment and Method: Bite block Placement Confirmation: positive ETCO2 Tube secured with: Tape Dental Injury: Teeth and Oropharynx as per pre-operative assessment

## 2018-03-11 NOTE — Op Note (Signed)
PRE-OPERATIVE DIAGNOSIS: left chest wall mass, port in place, h/o right breast cancer  POST-OPERATIVE DIAGNOSIS:  Same  PROCEDURE:  Procedure(s): Excision of left chest wall mass, removal of left subclavian port  SURGEON:  Surgeon(s): [name], MD  ASST: Judyann Munson, RNFA  ANESTHESIA:   general  DRAINS: none   LOCAL MEDICATIONS USED:  BUPIVICAINE  and LIDOCAINE   SPECIMEN:  Source of Specimen:  left chest wall mass  DISPOSITION OF SPECIMEN:  PATHOLOGY  COUNTS:  YES  DICTATION: .Dragon Dictation  PLAN OF CARE: Discharge to home after PACU  PATIENT DISPOSITION:  PACU - hemodynamically stable.  FINDINGS:  Fatty tissue mass below skin incision on left, port with intact tip.    EBL: min  PROCEDURE:   Pt was  identified in the holding area and taken to the operating room where she was placed supine on the operating room table.  General anesthesia was induced.  The left upper chest was prepped and draped.    The  incision was anesthetized with local anesthetic.  The incision was opened with a #15 blade.  The subcutaneous tissue was divided with the cautery.  The port was identified and the capsule opened.  The four 2-0 prolene sutures were removed.  The port was then removed and pressure held on the tract.  The catheter appeared intact without evidence of breakage, length was 18.5.  The wound was inspected for hemostasis, which was achieved with cautery.  The wound was closed with 3-0 vicryl deep dermal interrupted sutures and 4-0 Monocryl running subcuticular suture.    Incision on the left chest was opened for mastectomy.  The masslike tissue below the incision was freed up from the skin by using the assistance of mastectomy.  The skin flap was taken to be.  This was taken down to the serratus anterior and the inferior border of the pectoralis.  The flap lay down much flatter.  Hemostasis was achieved with the cautery.  The cavity was irrigated.  Flaps were anesthetized  with local.  The skin was then closed using interrupted 3-0 Vicryl deep dermal sutures and running 4 subcuticular suture.  The wound was then cleaned, dried, and dressed with Dermabond and Steri-Strips.  The patient was allowed to emerge from anesthesia and taken to the PACU in stable condition.  Needle, sponge, and instrument counts were correct x2.  The wound was cleaned, dried, and dressed with dermabond.  The patient was awakened from anesthesia and taken to the PACU in stable condition.  Needle, sponge, and instrument counts are correct.

## 2018-03-11 NOTE — Progress Notes (Signed)
Dr. Glennon Mac updated and came to bedside about crackles in lungs and increased oxygen demand in patient. MD auscultated lungs herself and agreed with crackles. MD ordered 10mg  of Lasix IV to be given. RN administered 10mg  Lasix IV. Will continue to monitor.

## 2018-03-11 NOTE — Interval H&P Note (Signed)
History and Physical Interval Note:  03/11/2018 1:03 PM  Stephanie Holden  has presented today for surgery, with the diagnosis of RIGHT BREAST CANCER, LEFT CHEST WALL MASS  The various methods of treatment have been discussed with the patient and family. After consideration of risks, benefits and other options for treatment, the patient has consented to  Procedure(s): REMOVAL PORT-A-CATH (N/A) EXCISION LEFT CHEST WALL MASS (Left) as a surgical intervention .  The patient's history has been reviewed, patient examined, no change in status, stable for surgery.  I have reviewed the patient's chart and labs.  Questions were answered to the patient's satisfaction.     [name]

## 2018-03-12 ENCOUNTER — Telehealth: Payer: Self-pay | Admitting: *Deleted

## 2018-03-12 ENCOUNTER — Encounter (HOSPITAL_COMMUNITY): Payer: Self-pay | Admitting: General Surgery

## 2018-03-12 NOTE — Telephone Encounter (Signed)
Called patient to ask if she would meet with research nurse tomorrow before or after her MD appointment to review and sign new consent for the [employer] Study.  Patient agreed to meet tomorrow at 9:30 am before her MD appointment.  Plan to meet patient in lobby at 9:30 am tomorrow morning.   Foye Spurling, BSN, RN Clinical Research Nurse 03/12/2018 10:07 AM

## 2018-03-13 ENCOUNTER — Ambulatory Visit
Admission: RE | Admit: 2018-03-13 | Discharge: 2018-03-13 | Disposition: A | Discharge status: Home / Self Care | Payer: BLUE CROSS/BLUE SHIELD | Source: Ambulatory Visit | Attending: Radiation Oncology | Admitting: Radiation Oncology

## 2018-03-13 ENCOUNTER — Other Ambulatory Visit: Payer: Self-pay

## 2018-03-13 ENCOUNTER — Encounter: Payer: Self-pay | Admitting: Radiation Oncology

## 2018-03-13 ENCOUNTER — Encounter: Payer: Self-pay | Admitting: *Deleted

## 2018-03-13 DIAGNOSIS — Z794 Long term (current) use of insulin: Secondary | ICD-10-CM | POA: Diagnosis not present

## 2018-03-13 DIAGNOSIS — C50611 Malignant neoplasm of axillary tail of right female breast: Secondary | ICD-10-CM | POA: Insufficient documentation

## 2018-03-13 DIAGNOSIS — Z79899 Other long term (current) drug therapy: Secondary | ICD-10-CM | POA: Diagnosis not present

## 2018-03-13 DIAGNOSIS — Z9012 Acquired absence of left breast and nipple: Secondary | ICD-10-CM | POA: Diagnosis not present

## 2018-03-13 DIAGNOSIS — Z170 Estrogen receptor positive status [ER+]: Secondary | ICD-10-CM | POA: Insufficient documentation

## 2018-03-13 DIAGNOSIS — Z923 Personal history of irradiation: Secondary | ICD-10-CM | POA: Diagnosis not present

## 2018-03-13 DIAGNOSIS — Z006 Encounter for examination for normal comparison and control in clinical research program: Secondary | ICD-10-CM

## 2018-03-13 HISTORY — DX: Personal history of irradiation: Z92.3

## 2018-03-13 NOTE — Progress Notes (Signed)
[employer] [id number] Study, Re-Consent PVD 11/18/17 Amendment 3: Met with patient in private conference room for 15 minutes today and reviewed new consent form using a redlined tracked changes form to inform patient of all changes from previously signed consent form.  Changes included adding waist measurement to data collection and optional receiving of text messages.  Patient denied any questions and signed and dated consent form for PVD 11/18/17, [place] active date 02/11/18.  Patient agreed to optional use of her samples for future research.  She did not agree to text messages or provide her cell phone number to receive text messages from the study. Copy of signed consent will be mailed to patient as she did not want to wait for copy to be made today.  Discussed next study visit for 12 month visit is due +/- 60 days from 07/13/18.  Patient is scheduled to see Wilber Bihari, NP on 07/13/18 and so she agreed to have study visit afterwards on same day. Study visit to include lab, questionnaires, VS, neurocognitive testing, physical functions testing and review of medications.  Informed patient we will add lab appointment for research labs before her appointment with Mendel Ryder.  Research nurse will call her the day before to remind her to fast for 3 hrs prior to lab. Thanked patient for her time today and ongoing participation in this study.  Encouraged patient to call if any questions or concerns prior to next visit.  Patient verbalized understanding/agreement with this plan.  Foye Spurling, BSN, RN Clinical Research Nurse 03/13/2018 1:23 PM

## 2018-03-13 NOTE — Progress Notes (Signed)
Radiation Oncology         [phone] ________________________________  Name: Stephanie Holden MRN: [id number]  Date: 03/13/2018  DOB: 01-Jul-1975  Follow-Up Visit Note  Outpatient  CC: Tisovec, Fransico Him, MD  Nicholas Lose, MD  Diagnosis and Prior Radiotherapy:    ICD-10-CM   1. Malignant neoplasm of axillary tail of right breast in female, estrogen receptor positive ([place]) C50.611    Z17.0     CHIEF COMPLAINT: Here for follow-up and surveillance of right breast cancer  Radiation treatment dates:   12/30/2017 - 02/10/2018  Site/dose:    1. Right Chest Wall and IM nodes / 50 Gy in 25 fractions 2. Right SCV, PAB nodes / 50 Gy in 25 fractions 3. Right Chest Wall Boost / 10 Gy in 5 fractions  Narrative:  The patient returns today for routine follow-up.  She has been doing well overall. She has been putting lotion on her radiation sites when she remembers to do so. She had a left mastectomy and she has had fluid noted under her skin to her left chest area. She called her surgeon's office this morning and was advised to use a warm compress and if the symptoms worsen, then she will be brought in for an aspiration of the area.                        ALLERGIES:  is allergic to dilaudid [hydromorphone hcl].  Meds: Current Outpatient Medications  Medication Sig Dispense Refill  . carvedilol (COREG) 3.125 MG tablet Take 1 tablet (3.125 mg total) by mouth 2 (two) times daily with a meal. 60 tablet 5  . furosemide (LASIX) 40 MG tablet Take 20 mg by mouth as needed for fluid or edema.     . gabapentin (NEURONTIN) 300 MG capsule Take 1 capsule (300 mg total) by mouth at bedtime. 90 capsule 3  . insulin aspart (NOVOLOG) 100 UNIT/ML injection Inject 45 Units into the skin See admin instructions. Use via insulin pump up to 45 units daily    . oxyCODONE-acetaminophen (PERCOCET/ROXICET) 5-325 MG tablet Take 1 tablet by mouth every 8 (eight) hours as needed for severe pain. 60 tablet 0  .  potassium chloride SA (K-DUR,KLOR-CON) 20 MEQ tablet Take 10 mEq by mouth daily as needed (with furosemide).     . Probiotic Product (PROBIOTIC DAILY PO) Take 1 capsule by mouth at bedtime.     . sacubitril-valsartan (ENTRESTO) 24-26 MG Take 1 tablet by mouth 2 (two) times daily. 60 tablet 11  . spironolactone (ALDACTONE) 25 MG tablet Take 0.5 tablets (12.5 mg total) by mouth daily. 15 tablet 3  . venlafaxine XR (EFFEXOR-XR) 75 MG 24 hr capsule Take 1 capsule (75 mg total) by mouth daily with breakfast. 30 capsule 3  . [START ON 03/26/2018] tamoxifen (NOLVADEX) 20 MG tablet Take 1 tablet (20 mg total) by mouth daily. (Patient not taking: Reported on 02/24/2018) 90 tablet 3   No current facility-administered medications for this encounter.     Physical Findings: The patient is in no acute distress. Patient is alert and oriented.  height is 5\' 2"  (1.575 m) and weight is 160 lb 2 oz (72.6 kg). Her oral temperature is 97.9 F (36.6 C). Her blood pressure is 113/82 and her pulse is 68. Her respiration is 18 and oxygen saturation is 100%. .    Satisfactory skin healing in radiotherapy fields. Skin over her right chest wall is slightly dry with some residual  mild hypo and hyperpigmentation where the radiation was delivered. RTOG skin toxicity score: 1.   Upper right back and the supraclavicular areas have healed almost completely.  S/p bilateral mastectomies, recently on left.  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 EXAM: CARDIAC MRI TECHNIQUE: The patient was scanned on a 1.5 Tesla GE magnet. A dedicated cardiac coil was used. Functional imaging was done using Fiesta sequences. 2,3, and 4 chamber views were done to assess for RWMA's.  Modified Simpson's rule using a short axis stack was used to calculate an ejection fraction on a dedicated work Conservation officer, nature. The patient received 7 cc of Gadavist. After 10 minutes inversion recovery sequences were used to assess for infiltration and scar tissue. FINDINGS: Limited images of the lung fields showed no gross abnormalities. Normal left ventricular size and wall thickness. Moderate diffuse hypokinesis, EF 37%. Normal right ventricular size with mildly decreased systolic function, EF 01%. Mild left atrial enlargement. Normal right atrial size. Trileaflet aortic valve with no significant stenosis or regurgitation. Trivial mitral regurgitation noted. On delayed enhancement imaging, there was no myocardial late gadolinium enhancement (LGE). Measurements: LVEDV 162 mL LVSV 59 mL LVEF 37% RVEDV 105 mL RVSV 45 mL RVEF 43% IMPRESSION: 1.  Normal LV size with moderate diffuse hypokinesis, EF 37%. 2.  Normal RV size with mildly decreased systolic function, EF 74%. 3. No myocardial LGE, so no definitive evidence for prior myocardial infarction, infiltrative disease, or myocarditis. Dalton Mclean Electronically Signed   By: Loralie Champagne M.D.   On: 03/04/2018 23:03    Impression/Plan: Healing well from radiotherapy.  Continue skin care with topical Vitamin E Oil and / or lotion for at least 2 more months for further healing.  I encouraged her to continue with followup with medical oncology. I will see her back on an as-needed basis. I have encouraged her to call if she has any issues or concerns in the future. I wished her the very best.    _____________________________________   Eppie Gibson, MD  This document serves as a record of services personally performed by Eppie Gibson, MD. It was created on her behalf by Steva Colder, a trained medical scribe. The creation of this record is based on the scribe's personal observations and the provider's statements to them. This document has  been checked and approved by the attending provider.

## 2018-03-16 ENCOUNTER — Encounter (HOSPITAL_COMMUNITY): Payer: BLUE CROSS/BLUE SHIELD | Admitting: Internal Medicine

## 2018-03-16 ENCOUNTER — Other Ambulatory Visit (HOSPITAL_COMMUNITY): Payer: BLUE CROSS/BLUE SHIELD

## 2018-03-24 DIAGNOSIS — N390 Urinary tract infection, site not specified: Secondary | ICD-10-CM | POA: Diagnosis not present

## 2018-03-24 DIAGNOSIS — R300 Dysuria: Secondary | ICD-10-CM | POA: Diagnosis not present

## 2018-03-25 DIAGNOSIS — I509 Heart failure, unspecified: Secondary | ICD-10-CM

## 2018-03-25 HISTORY — DX: Heart failure, unspecified: I50.9

## 2018-03-30 ENCOUNTER — Other Ambulatory Visit: Payer: Self-pay | Admitting: Hematology and Oncology

## 2018-03-30 ENCOUNTER — Other Ambulatory Visit (HOSPITAL_COMMUNITY): Payer: Self-pay | Admitting: Internal Medicine

## 2018-03-30 DIAGNOSIS — M5136 Other intervertebral disc degeneration, lumbar region: Secondary | ICD-10-CM | POA: Diagnosis not present

## 2018-03-30 DIAGNOSIS — M9904 Segmental and somatic dysfunction of sacral region: Secondary | ICD-10-CM | POA: Diagnosis not present

## 2018-03-30 DIAGNOSIS — M62830 Muscle spasm of back: Secondary | ICD-10-CM | POA: Diagnosis not present

## 2018-03-30 DIAGNOSIS — M9903 Segmental and somatic dysfunction of lumbar region: Secondary | ICD-10-CM | POA: Diagnosis not present

## 2018-04-02 DIAGNOSIS — M9903 Segmental and somatic dysfunction of lumbar region: Secondary | ICD-10-CM | POA: Diagnosis not present

## 2018-04-02 DIAGNOSIS — M5136 Other intervertebral disc degeneration, lumbar region: Secondary | ICD-10-CM | POA: Diagnosis not present

## 2018-04-02 DIAGNOSIS — M9904 Segmental and somatic dysfunction of sacral region: Secondary | ICD-10-CM | POA: Diagnosis not present

## 2018-04-02 DIAGNOSIS — M62830 Muscle spasm of back: Secondary | ICD-10-CM | POA: Diagnosis not present

## 2018-04-07 DIAGNOSIS — E109 Type 1 diabetes mellitus without complications: Secondary | ICD-10-CM | POA: Diagnosis not present

## 2018-04-07 DIAGNOSIS — Z794 Long term (current) use of insulin: Secondary | ICD-10-CM | POA: Diagnosis not present

## 2018-04-09 ENCOUNTER — Encounter (HOSPITAL_COMMUNITY): Payer: Self-pay | Admitting: Internal Medicine

## 2018-04-09 ENCOUNTER — Ambulatory Visit (HOSPITAL_COMMUNITY)
Admission: RE | Admit: 2018-04-09 | Discharge: 2018-04-09 | Disposition: A | Discharge status: Home / Self Care | Payer: BLUE CROSS/BLUE SHIELD | Source: Ambulatory Visit | Attending: Internal Medicine | Admitting: Internal Medicine

## 2018-04-09 VITALS — BP 106/64 | HR 78 | Wt 161.1 lb

## 2018-04-09 DIAGNOSIS — Z87891 Personal history of nicotine dependence: Secondary | ICD-10-CM | POA: Insufficient documentation

## 2018-04-09 DIAGNOSIS — Z170 Estrogen receptor positive status [ER+]: Secondary | ICD-10-CM | POA: Insufficient documentation

## 2018-04-09 DIAGNOSIS — I5022 Chronic systolic (congestive) heart failure: Secondary | ICD-10-CM

## 2018-04-09 DIAGNOSIS — E109 Type 1 diabetes mellitus without complications: Secondary | ICD-10-CM | POA: Insufficient documentation

## 2018-04-09 DIAGNOSIS — J90 Pleural effusion, not elsewhere classified: Secondary | ICD-10-CM | POA: Insufficient documentation

## 2018-04-09 DIAGNOSIS — F329 Major depressive disorder, single episode, unspecified: Secondary | ICD-10-CM | POA: Diagnosis not present

## 2018-04-09 DIAGNOSIS — Z885 Allergy status to narcotic agent status: Secondary | ICD-10-CM | POA: Insufficient documentation

## 2018-04-09 DIAGNOSIS — Z9641 Presence of insulin pump (external) (internal): Secondary | ICD-10-CM | POA: Insufficient documentation

## 2018-04-09 DIAGNOSIS — I5021 Acute systolic (congestive) heart failure: Secondary | ICD-10-CM | POA: Insufficient documentation

## 2018-04-09 DIAGNOSIS — Z803 Family history of malignant neoplasm of breast: Secondary | ICD-10-CM | POA: Insufficient documentation

## 2018-04-09 DIAGNOSIS — Z794 Long term (current) use of insulin: Secondary | ICD-10-CM | POA: Diagnosis not present

## 2018-04-09 DIAGNOSIS — C50611 Malignant neoplasm of axillary tail of right female breast: Secondary | ICD-10-CM | POA: Insufficient documentation

## 2018-04-09 DIAGNOSIS — Z79810 Long term (current) use of selective estrogen receptor modulators (SERMs): Secondary | ICD-10-CM | POA: Diagnosis not present

## 2018-04-09 DIAGNOSIS — Z79899 Other long term (current) drug therapy: Secondary | ICD-10-CM | POA: Insufficient documentation

## 2018-04-09 LAB — BASIC METABOLIC PANEL
Anion gap: 7 (ref 5–15)
BUN: 8 mg/dL (ref 6–20)
CO2: 30 mmol/L (ref 22–32)
Calcium: 9.5 mg/dL (ref 8.9–10.3)
Chloride: 105 mmol/L (ref 98–111)
Creatinine, Ser: 0.88 mg/dL (ref 0.44–1.00)
GFR calc Af Amer: >60 mL/min (ref >60)
GFR calc non Af Amer: >60 mL/min (ref >60)
GLUCOSE: 148 mg/dL — AB (ref 70–99)
Potassium: 5.3 mmol/L — ABNORMAL HIGH (ref 3.5–5.1)
Sodium: 142 mmol/L (ref 135–145)

## 2018-04-09 MED ORDER — SACUBITRIL-VALSARTAN 49-51 MG PO TABS: 1.0000 | ORAL_TABLET | Freq: Two times a day (BID) | ORAL | 6 refills | Status: DC

## 2018-04-09 NOTE — Progress Notes (Signed)
Advanced Heart Failure Clinic Note   Referring Physician: Dr Lindi Adie PCP: Tisovec, Fransico Him, MD PCP-Cardiologist: Glori Bickers, MD   HPI: Stephanie Holden is a 43 y.o. female with a history of type 1 DM on insulin pump, thyroid goiter, and breast cancer.     Malignant neoplasm of axillary tail of right breast in female, estrogen receptor positive ([place])   05/09/2017 Initial Diagnosis    May 2018: Palpable right axillary mass.  The recommended a 6-week follow-up but apparently the mass decreased in size.  Recent increase in the mass was noted, biopsy revealed Grade 1 IDC with DCIS, lymphovascular invasion present, 4 small hypoechoic masses largest 0.9 cm, T1BN1 stage Ib clinical stage      06/05/2017 Surgery    Bilateral mastectomies: Right mastectomy: IDC grade 1, 3 foci 1.2 cm, 1.2 cm, 1 cm, IgA DCIS, LVH diffuse present, margins negative, 4/7 lymph nodes positive with extracapsular extension, ER 95%, PR 95%, HER-2 negative, Ki-67 2% to 10%, T1 cN2 aM0 stage II a pathological stage; left mastectomy: Benign      06/26/2017 Surgery    Axillary lymph node dissection: 2/22 lymph nodes positive (making a total of 6+ lymph nodes)      07/22/2017 -  Chemotherapy    Adjuvant chemotherapy with dose dense Adriamycin and Cytoxan x4 followed by Taxol weekly x12    I saw her in 6/19 for the first time in the cardio-oncology clinic in 6/19. She had recently completed chemo and echo showed EF 60-65%. She was scheduled to see me in 6 months with repeat echo to ensure stability. Unfortunately she has been admitted 2x over the past month. Initially felt to have PNA but then found to have systolic HF with EF 83-41%. Course c/b large R pleural effusion. Initially treated with thoracentesis on 01/13/18 but then recurred so chest tube was placed. Effusion found to be transudative with negative cytology. Was diuresed in hospital and f/u arranged here.   Feeling much better. Breathing ok. No CP  or SOB. BP well controlled. SBP 95-130. SBP usually around 110. HRs 70-90s. Mild swelling in hands and face. Weight up a few pounds. Taking lasix only prn.   cMRI 12/19 1.  Normal LV size with moderate diffuse hypokinesis, EF 37%. 2.  Normal RV size with mildly decreased systolic function, EF 96%. 3. No myocardial LGE, so no definitive evidence for prior myocardial infarction, infiltrative disease, or myocarditis.  Echo 01/28/18 EF 25-30% Grade II DD. Mild RV dysfunction Personally reviewed  Echo 6/19 EF 60-65% LS 11.90 GLS 18.7%    Past Medical History:  Diagnosis Date  . Cancer [facility])    Breast cancer - right  . CHF (congestive heart failure) ([place])   . Constipation   . Depression   . Genetic testing 05/27/2017   Breast/GYN panel (23 genes) @ [facility] - No pathogenic mutations detected  . GERD (gastroesophageal reflux disease)   . History of radiation therapy 12/30/17- 02/10/18   Right chest wall/ 50 Gy in 25 fractions, Right SCV, PAB nodes/ 50 Gy in 25 fractions, Right Chest wall boost 10 gy in 5 fractions.   . Insulin dependent type 1 diabetes mellitus ([place])   . Insulin pump in place   . PONV (postoperative nausea and vomiting)   . Stenosing tenosynovitis of thumb 06/2014   right  . Thyroid goiter     Current Outpatient Medications  Medication Sig Dispense Refill  . carvedilol (COREG) 3.125 MG tablet Take 1 tablet (3.125 mg  total) by mouth 2 (two) times daily with a meal. 60 tablet 5  . furosemide (LASIX) 40 MG tablet Take 20 mg by mouth as needed for fluid or edema.     . gabapentin (NEURONTIN) 300 MG capsule Take 1 capsule (300 mg total) by mouth at bedtime. 90 capsule 3  . insulin aspart (NOVOLOG) 100 UNIT/ML injection Inject 45 Units into the skin See admin instructions. Use via insulin pump up to 45 units daily    . oxyCODONE-acetaminophen (PERCOCET/ROXICET) 5-325 MG tablet Take 1 tablet by mouth every 8 (eight) hours as needed for severe pain. 60 tablet 0  . potassium  chloride SA (K-DUR,KLOR-CON) 20 MEQ tablet Take 10 mEq by mouth daily as needed (with furosemide).     . sacubitril-valsartan (ENTRESTO) 24-26 MG Take 1 tablet by mouth 2 (two) times daily. 60 tablet 11  . spironolactone (ALDACTONE) 25 MG tablet TAKE 1/2 TABLET BY MOUTH EVERY DAY 15 tablet 3  . tamoxifen (NOLVADEX) 20 MG tablet Take 1 tablet (20 mg total) by mouth daily. 90 tablet 3  . venlafaxine XR (EFFEXOR-XR) 75 MG 24 hr capsule TAKE 1 CAPSULE (75 MG TOTAL) BY MOUTH DAILY WITH BREAKFAST. 30 capsule 3   No current facility-administered medications for this encounter.     Allergies  Allergen Reactions  . Dilaudid [Hydromorphone Hcl] Itching and Other (See Comments)    UNCONTROLLABLE CRYING      Social History   Socioeconomic History  . Marital status: Married    Spouse name: Not on file  . Number of children: Not on file  . Years of education: Not on file  . Highest education level: Not on file  Occupational History  . Not on file  Social Needs  . Financial resource strain: Not on file  . Food insecurity:    Worry: Not on file    Inability: Not on file  . Transportation needs:    Medical: No    Non-medical: No  Tobacco Use  . Smoking status: Former Smoker    Last attempt to quit: 03/24/2001    Years since quitting: 17.0  . Smokeless tobacco: Never Used  Substance and Sexual Activity  . Alcohol use: Not Currently  . Drug use: No  . Sexual activity: Yes    Partners: Male    Birth control/protection: Surgical    Comment: tubal ligation & 1 tube removed  Lifestyle  . Physical activity:    Days per week: Not on file    Minutes per session: Not on file  . Stress: Not on file  Relationships  . Social connections:    Talks on phone: Not on file    Gets together: Not on file    Attends religious service: Not on file    Active member of club or organization: Not on file    Attends meetings of clubs or organizations: Not on file    Relationship status: Not on file  .  Intimate partner violence:    Fear of current or ex partner: No    Emotionally abused: No    Physically abused: No    Forced sexual activity: No  Other Topics Concern  . Not on file  Social History Narrative  . Not on file      Family History  Problem Relation Age of Onset  . Hypertension Mother   . Hypertension Father   . Breast cancer Other 53       paternal grandfather's niece    Vitals:  04/09/18 0900  BP: 106/64  Pulse: 78  SpO2: 98%  Weight: 73.1 kg (161 lb 1.6 oz)    PHYSICAL EXAM: General:  Well appearing. No resp difficulty HEENT: normal Neck: supple. no JVD. Carotids 2+ bilat; no bruits. No lymphadenopathy or thryomegaly appreciated. Cor: PMI nondisplaced. Regular rate & rhythm. No rubs, gallops or murmurs. Lungs: clear Abdomen: soft, nontender, nondistended. No hepatosplenomegaly. No bruits or masses. Good bowel sounds. Extremities: no cyanosis, clubbing, rash, edema Neuro: alert & orientedx3, cranial nerves grossly intact. moves all 4 extremities w/o difficulty. Affect pleasant  ASSESSMENT & PLAN:  1. Acute systolic HF - echo 7/68 EF 60% - echo 11/19 EF 25-30% - cMRI 12/19 EF 37% - given time course and her age this almost certainly adriamycin-induced cardiotoxicty however Type I DM is a confounder though this has been very well controlled - Doing well NYHA I-II - Volume status ok - Increase Entresto to 49/51 bid - Continue spiro 12.54m daily - Continue lasix prn - Labs today - Will arrange f/u with NP/PA in 4 weeks to try to increase carvedilol.  - Will repeat echo in 3-6 months   2. Malignant neoplasm of axillary tail of right breast in female, estrogen receptor positive  - Diagnosed with breast cancer 05/09/17. Underwent bilateral mastectomies on 06/05/17. Underwent axillary lymph node dissection on 06/26/17. Started chemo 07/22/17 with adriamycin and cytoxan x4, then taxol weekly x12. No finishing XRT on 02/09/18 - Echo 06/20/17: EF 55-60%, trivial  TR. - Echo 6/19 EF 60-65% LS 11.90 GLS 18.7% - Echo 11/19 EF 25-30% suggestive of adriamycin cardiotoxicity  3. R pleural effusion - s/p chest tube drainage 11/19 - transudative. Cytology negative   DGlori Bickers MD  9:17 AM

## 2018-04-09 NOTE — Addendum Note (Signed)
Encounter addended by: Marlise Eves, RN on: 04/09/2018 9:33 AM  Actions taken: Diagnosis association updated, Order list changed, Charge Capture section accepted, Clinical Note Signed

## 2018-04-09 NOTE — Patient Instructions (Signed)
Labs done today  INCREASE Entresto 49/51 (1 tab) twice daily  Follow up with the Advanced Practice Provider in 1 month  Follow up with Dr. Haroldine Laws in 103months

## 2018-04-13 ENCOUNTER — Telehealth (HOSPITAL_COMMUNITY): Payer: Self-pay

## 2018-04-13 DIAGNOSIS — M5136 Other intervertebral disc degeneration, lumbar region: Secondary | ICD-10-CM | POA: Diagnosis not present

## 2018-04-13 DIAGNOSIS — M9904 Segmental and somatic dysfunction of sacral region: Secondary | ICD-10-CM | POA: Diagnosis not present

## 2018-04-13 DIAGNOSIS — I5022 Chronic systolic (congestive) heart failure: Secondary | ICD-10-CM

## 2018-04-13 DIAGNOSIS — M9903 Segmental and somatic dysfunction of lumbar region: Secondary | ICD-10-CM | POA: Diagnosis not present

## 2018-04-13 DIAGNOSIS — M62830 Muscle spasm of back: Secondary | ICD-10-CM | POA: Diagnosis not present

## 2018-04-13 NOTE — Telephone Encounter (Signed)
-----   Message from Jolaine Artist, MD sent at 04/09/2018  4:54 PM EST ----- K high. Repeat BMET 1 week.

## 2018-04-13 NOTE — Telephone Encounter (Signed)
Spoke to patient, aware of results. Lab appointment made for this Friday for repeat blood work. Aware to monitor foods with high potassium

## 2018-04-16 DIAGNOSIS — M9903 Segmental and somatic dysfunction of lumbar region: Secondary | ICD-10-CM | POA: Diagnosis not present

## 2018-04-16 DIAGNOSIS — M9904 Segmental and somatic dysfunction of sacral region: Secondary | ICD-10-CM | POA: Diagnosis not present

## 2018-04-16 DIAGNOSIS — M5136 Other intervertebral disc degeneration, lumbar region: Secondary | ICD-10-CM | POA: Diagnosis not present

## 2018-04-16 DIAGNOSIS — M62830 Muscle spasm of back: Secondary | ICD-10-CM | POA: Diagnosis not present

## 2018-04-17 ENCOUNTER — Ambulatory Visit (HOSPITAL_COMMUNITY)
Admission: RE | Admit: 2018-04-17 | Discharge: 2018-04-17 | Disposition: A | Discharge status: Home / Self Care | Payer: BLUE CROSS/BLUE SHIELD | Source: Ambulatory Visit | Attending: Cardiology | Admitting: Cardiology

## 2018-04-17 DIAGNOSIS — I5022 Chronic systolic (congestive) heart failure: Secondary | ICD-10-CM | POA: Diagnosis not present

## 2018-04-17 LAB — BASIC METABOLIC PANEL
Anion gap: 6 (ref 5–15)
BUN: 11 mg/dL (ref 6–20)
CALCIUM: 9 mg/dL (ref 8.9–10.3)
CO2: 29 mmol/L (ref 22–32)
CREATININE: 0.81 mg/dL (ref 0.44–1.00)
Chloride: 105 mmol/L (ref 98–111)
GFR calc Af Amer: >60 mL/min (ref >60)
GFR calc non Af Amer: >60 mL/min (ref >60)
Glucose, Bld: 139 mg/dL — ABNORMAL HIGH (ref 70–99)
Potassium: 4.5 mmol/L (ref 3.5–5.1)
SODIUM: 140 mmol/L (ref 135–145)

## 2018-04-20 DIAGNOSIS — M5136 Other intervertebral disc degeneration, lumbar region: Secondary | ICD-10-CM | POA: Diagnosis not present

## 2018-04-20 DIAGNOSIS — M9903 Segmental and somatic dysfunction of lumbar region: Secondary | ICD-10-CM | POA: Diagnosis not present

## 2018-04-20 DIAGNOSIS — M62830 Muscle spasm of back: Secondary | ICD-10-CM | POA: Diagnosis not present

## 2018-04-20 DIAGNOSIS — M9904 Segmental and somatic dysfunction of sacral region: Secondary | ICD-10-CM | POA: Diagnosis not present

## 2018-04-21 ENCOUNTER — Other Ambulatory Visit (HOSPITAL_COMMUNITY): Payer: BLUE CROSS/BLUE SHIELD

## 2018-04-23 DIAGNOSIS — M5136 Other intervertebral disc degeneration, lumbar region: Secondary | ICD-10-CM | POA: Diagnosis not present

## 2018-04-23 DIAGNOSIS — M9904 Segmental and somatic dysfunction of sacral region: Secondary | ICD-10-CM | POA: Diagnosis not present

## 2018-04-23 DIAGNOSIS — M62830 Muscle spasm of back: Secondary | ICD-10-CM | POA: Diagnosis not present

## 2018-04-23 DIAGNOSIS — M9903 Segmental and somatic dysfunction of lumbar region: Secondary | ICD-10-CM | POA: Diagnosis not present

## 2018-04-27 DIAGNOSIS — M5136 Other intervertebral disc degeneration, lumbar region: Secondary | ICD-10-CM | POA: Diagnosis not present

## 2018-04-27 DIAGNOSIS — M9904 Segmental and somatic dysfunction of sacral region: Secondary | ICD-10-CM | POA: Diagnosis not present

## 2018-04-27 DIAGNOSIS — M62830 Muscle spasm of back: Secondary | ICD-10-CM | POA: Diagnosis not present

## 2018-04-27 DIAGNOSIS — M9903 Segmental and somatic dysfunction of lumbar region: Secondary | ICD-10-CM | POA: Diagnosis not present

## 2018-05-04 DIAGNOSIS — Z794 Long term (current) use of insulin: Secondary | ICD-10-CM | POA: Diagnosis not present

## 2018-05-04 DIAGNOSIS — E7800 Pure hypercholesterolemia, unspecified: Secondary | ICD-10-CM | POA: Diagnosis not present

## 2018-05-04 DIAGNOSIS — F418 Other specified anxiety disorders: Secondary | ICD-10-CM | POA: Diagnosis not present

## 2018-05-04 DIAGNOSIS — Z4681 Encounter for fitting and adjustment of insulin pump: Secondary | ICD-10-CM | POA: Diagnosis not present

## 2018-05-04 DIAGNOSIS — Z1331 Encounter for screening for depression: Secondary | ICD-10-CM | POA: Diagnosis not present

## 2018-05-04 DIAGNOSIS — E10319 Type 1 diabetes mellitus with unspecified diabetic retinopathy without macular edema: Secondary | ICD-10-CM | POA: Diagnosis not present

## 2018-05-04 DIAGNOSIS — E041 Nontoxic single thyroid nodule: Secondary | ICD-10-CM | POA: Diagnosis not present

## 2018-05-08 DIAGNOSIS — Z794 Long term (current) use of insulin: Secondary | ICD-10-CM | POA: Diagnosis not present

## 2018-05-08 DIAGNOSIS — E109 Type 1 diabetes mellitus without complications: Secondary | ICD-10-CM | POA: Diagnosis not present

## 2018-05-08 NOTE — Progress Notes (Signed)
Advanced Heart Failure Clinic Note   Referring Physician: Dr Lindi Adie PCP: Tisovec, Fransico Him, MD PCP-Cardiologist: Glori Bickers, MD   HPI: Stephanie Holden is a 43 y.o. female with a history of type 1 DM on insulin pump, thyroid goiter, and breast cancer.     Malignant neoplasm of axillary tail of right breast in female, estrogen receptor positive ([place])   05/09/2017 Initial Diagnosis    May 2018: Palpable right axillary mass.  The recommended a 6-week follow-up but apparently the mass decreased in size.  Recent increase in the mass was noted, biopsy revealed Grade 1 IDC with DCIS, lymphovascular invasion present, 4 small hypoechoic masses largest 0.9 cm, T1BN1 stage Ib clinical stage      06/05/2017 Surgery    Bilateral mastectomies: Right mastectomy: IDC grade 1, 3 foci 1.2 cm, 1.2 cm, 1 cm, IgA DCIS, LVH diffuse present, margins negative, 4/7 lymph nodes positive with extracapsular extension, ER 95%, PR 95%, HER-2 negative, Ki-67 2% to 10%, T1 cN2 aM0 stage II a pathological stage; left mastectomy: Benign      06/26/2017 Surgery    Axillary lymph node dissection: 2/22 lymph nodes positive (making a total of 6+ lymph nodes)      07/22/2017 -  Chemotherapy    Adjuvant chemotherapy with dose dense Adriamycin and Cytoxan x4 followed by Taxol weekly x12   Dr Haroldine Laws saw her in 6/19 for the first time in the cardio-oncology clinic in 6/19. She had recently completed chemo and echo showed EF 60-65%. She was scheduled to see me in 6 months with repeat echo to ensure stability. Unfortunately she has been admitted 2x over the past month. Initially felt to have PNA but then found to have systolic HF with EF 81-44%. Course c/b large R pleural effusion. Initially treated with thoracentesis on 01/13/18 but then recurred so chest tube was placed. Effusion found to be transudative with negative cytology. Was diuresed in hospital and f/u arranged here.   She returns today for HF follow  up. Last visit Entresto was increased. Overall doing well. She is walking/jogging on treadmill every day. No SOB, edema, orthopnea, or PND. No dizziness. She has an episode of chest tightness while jogging on the treadmill, which lasted about a minutes and resolved after slowing down. It was the first time she had tried going faster. No associated symptoms and none since. Taking lasix PRN when she eats salty meals, about 2-3 times/week. Has been eating more salty meals lately. Taking all medications. SBP 100-110s, HR 80-90s at home.   cMRI 12/19 1.  Normal LV size with moderate diffuse hypokinesis, EF 37%. 2.  Normal RV size with mildly decreased systolic function, EF 81%. 3. No myocardial LGE, so no definitive evidence for prior myocardial infarction, infiltrative disease, or myocarditis.  Echo 01/28/18 EF 25-30% Grade II DD. Mild RV dysfunction Personally reviewed  Echo 6/19 EF 60-65% LS 11.90 GLS 18.7%  Review of systems complete and found to be negative unless listed in HPI.   Past Medical History:  Diagnosis Date  . Cancer [facility])    Breast cancer - right  . CHF (congestive heart failure) ([place])   . Constipation   . Depression   . Genetic testing 05/27/2017   Breast/GYN panel (23 genes) @ [facility] - No pathogenic mutations detected  . GERD (gastroesophageal reflux disease)   . History of radiation therapy 12/30/17- 02/10/18   Right chest wall/ 50 Gy in 25 fractions, Right SCV, PAB nodes/ 50 Gy in 25 fractions,  Right Chest wall boost 10 gy in 5 fractions.   . Insulin dependent type 1 diabetes mellitus ([place])   . Insulin pump in place   . PONV (postoperative nausea and vomiting)   . Stenosing tenosynovitis of thumb 06/2014   right  . Thyroid goiter     Current Outpatient Medications  Medication Sig Dispense Refill  . carvedilol (COREG) 3.125 MG tablet Take 1 tablet (3.125 mg total) by mouth 2 (two) times daily with a meal. 60 tablet 5  . furosemide (LASIX) 40 MG tablet Take 40 mg by  mouth as needed for fluid or edema.     . gabapentin (NEURONTIN) 300 MG capsule Take 1 capsule (300 mg total) by mouth at bedtime. 90 capsule 3  . insulin aspart (NOVOLOG) 100 UNIT/ML injection Inject 45 Units into the skin See admin instructions. Use via insulin pump up to 45 units daily    . oxyCODONE-acetaminophen (PERCOCET/ROXICET) 5-325 MG tablet Take 1 tablet by mouth every 8 (eight) hours as needed for severe pain. 60 tablet 0  . potassium chloride SA (K-DUR,KLOR-CON) 20 MEQ tablet Take 10 mEq by mouth daily as needed (with furosemide).     . pravastatin (PRAVACHOL) 20 MG tablet Take 20 mg by mouth daily. Pt not sure of dose, but thinks it is 20.    . sacubitril-valsartan (ENTRESTO) 49-51 MG Take 1 tablet by mouth 2 (two) times daily. 60 tablet 6  . spironolactone (ALDACTONE) 25 MG tablet TAKE 1/2 TABLET BY MOUTH EVERY DAY 15 tablet 3  . tamoxifen (NOLVADEX) 20 MG tablet Take 1 tablet (20 mg total) by mouth daily. 90 tablet 3  . venlafaxine XR (EFFEXOR-XR) 75 MG 24 hr capsule TAKE 1 CAPSULE (75 MG TOTAL) BY MOUTH DAILY WITH BREAKFAST. 30 capsule 3   No current facility-administered medications for this encounter.     Allergies  Allergen Reactions  . Dilaudid [Hydromorphone Hcl] Itching and Other (See Comments)    UNCONTROLLABLE CRYING      Social History   Socioeconomic History  . Marital status: Married    Spouse name: Not on file  . Number of children: Not on file  . Years of education: Not on file  . Highest education level: Not on file  Occupational History  . Not on file  Social Needs  . Financial resource strain: Not on file  . Food insecurity:    Worry: Not on file    Inability: Not on file  . Transportation needs:    Medical: No    Non-medical: No  Tobacco Use  . Smoking status: Former Smoker    Last attempt to quit: 03/24/2001    Years since quitting: 17.1  . Smokeless tobacco: Never Used  Substance and Sexual Activity  . Alcohol use: Not Currently  .  Drug use: No  . Sexual activity: Yes    Partners: Male    Birth control/protection: Surgical    Comment: tubal ligation & 1 tube removed  Lifestyle  . Physical activity:    Days per week: Not on file    Minutes per session: Not on file  . Stress: Not on file  Relationships  . Social connections:    Talks on phone: Not on file    Gets together: Not on file    Attends religious service: Not on file    Active member of club or organization: Not on file    Attends meetings of clubs or organizations: Not on file    Relationship status:  Not on file  . Intimate partner violence:    Fear of current or ex partner: No    Emotionally abused: No    Physically abused: No    Forced sexual activity: No  Other Topics Concern  . Not on file  Social History Narrative  . Not on file      Family History  Problem Relation Age of Onset  . Hypertension Mother   . Hypertension Father   . Breast cancer Other 20       paternal grandfather's niece    Vitals:   05/11/18 0915  BP: 110/70  Pulse: 83  SpO2: 97%  Weight: 73.2 kg (161 lb 6.4 oz)   Wt Readings from Last 3 Encounters:  05/11/18 73.2 kg (161 lb 6.4 oz)  04/09/18 73.1 kg (161 lb 1.6 oz)  03/13/18 72.6 kg (160 lb 2 oz)    PHYSICAL EXAM: General: Well appearing. No resp difficulty. HEENT: Normal Neck: Supple. No JVD. Carotids 2+ bilat; no bruits. No thyromegaly or nodule noted. Cor: PMI nondisplaced. RRR, No M/G/R noted Lungs: CTAB, normal effort. Abdomen: Soft, non-tender, non-distended, no HSM. No bruits or masses. +BS  Extremities: No cyanosis, clubbing, or rash. R and LLE no edema.  Neuro: Alert & orientedx3, cranial nerves grossly intact. moves all 4 extremities w/o difficulty. Affect pleasant  EKG: NSR 76 bpm. Compared to EKG 01/2018 and no longer has TWI in inferior leads. Personally reviewed.   ASSESSMENT & PLAN:  1. Chronic systolic HF - given time course and her age this almost certainly adriamycin-induced  cardiotoxicty however Type I DM is a confounder though this has been very well controlled - echo 6/19 EF 60% - echo 11/19 EF 25-30% - cMRI 12/19 EF 37%. Out of window for ICD. - Doing well NYHA I-II - Volume status stable on exam. Takes lasix PRN, 2-3 x/week.  - Continue Entresto to 49/51 bid. BMET today.  - Continue spiro 12.5 mg daily. Recently had hyperkalemia to 5.3. Hold off on increasing.  - Increase coreg to 6.25 mg BID.  - Repeat echo in April. Add to appointment with Dr Haroldine Laws - Discussed cardiac rehab, but she would like to continue home exercises. Will have Erasmo Downer, HF exercise physiologist discuss some exercises for home. Encouraged her to take it slowly and back off if she has CP, SOB, or dizziness.   2. Malignant neoplasm of axillary tail of right breast in female, estrogen receptor positive  - Diagnosed with breast cancer 05/09/17. Underwent bilateral mastectomies on 06/05/17. Underwent axillary lymph node dissection on 06/26/17. Started chemo 07/22/17 with adriamycin and cytoxan x4, then taxol weekly x12. No finishing XRT on 02/09/18 - Echo 06/20/17: EF 55-60%, trivial TR. - Echo 6/19 EF 60-65% LS 11.90 GLS 18.7% - Echo 11/19 EF 25-30% suggestive of adriamycin cardiotoxicity. See above.  - cMRI 12/19: EF 37%.   3. R pleural effusion - s/p chest tube drainage 11/19 - transudative. Cytology negative. - Lungs clear on exam.   4. Chest pain - Occurred during first time trying to jog (instead of walk) on treadmill. No associated symptoms. Resolved once she slowed down. - EKG today looks okay. - Advised her to let us know if this happens again and to take it slowly on treadmill.   5. Hyperlipidemia - Her endocrinologist started her on pravastatin recently.   Add echo to April follow up BMET today Increase coreg to 6.25 mg BID  Stephanie Shore, NP  9:30 AM  Greater than 50% of  the 25 minute visit was spent in counseling/coordination of care regarding disease state  education, salt/fluid restriction, sliding scale diuretics, and medication compliance.

## 2018-05-11 ENCOUNTER — Encounter (HOSPITAL_COMMUNITY): Payer: Self-pay

## 2018-05-11 ENCOUNTER — Ambulatory Visit (HOSPITAL_COMMUNITY)
Admission: RE | Admit: 2018-05-11 | Discharge: 2018-05-11 | Disposition: A | Discharge status: Home / Self Care | Payer: BLUE CROSS/BLUE SHIELD | Source: Ambulatory Visit | Attending: Internal Medicine | Admitting: Internal Medicine

## 2018-05-11 ENCOUNTER — Other Ambulatory Visit: Payer: Self-pay

## 2018-05-11 VITALS — BP 110/70 | HR 83 | Wt 161.4 lb

## 2018-05-11 DIAGNOSIS — E109 Type 1 diabetes mellitus without complications: Secondary | ICD-10-CM | POA: Diagnosis not present

## 2018-05-11 DIAGNOSIS — J90 Pleural effusion, not elsewhere classified: Secondary | ICD-10-CM

## 2018-05-11 DIAGNOSIS — Z9641 Presence of insulin pump (external) (internal): Secondary | ICD-10-CM | POA: Diagnosis not present

## 2018-05-11 DIAGNOSIS — I5022 Chronic systolic (congestive) heart failure: Secondary | ICD-10-CM | POA: Insufficient documentation

## 2018-05-11 DIAGNOSIS — Z170 Estrogen receptor positive status [ER+]: Secondary | ICD-10-CM | POA: Diagnosis not present

## 2018-05-11 DIAGNOSIS — Z885 Allergy status to narcotic agent status: Secondary | ICD-10-CM | POA: Diagnosis not present

## 2018-05-11 DIAGNOSIS — C50911 Malignant neoplasm of unspecified site of right female breast: Secondary | ICD-10-CM | POA: Diagnosis not present

## 2018-05-11 DIAGNOSIS — C50611 Malignant neoplasm of axillary tail of right female breast: Secondary | ICD-10-CM | POA: Diagnosis not present

## 2018-05-11 DIAGNOSIS — E875 Hyperkalemia: Secondary | ICD-10-CM | POA: Diagnosis not present

## 2018-05-11 DIAGNOSIS — Z87891 Personal history of nicotine dependence: Secondary | ICD-10-CM | POA: Diagnosis not present

## 2018-05-11 DIAGNOSIS — Z9013 Acquired absence of bilateral breasts and nipples: Secondary | ICD-10-CM | POA: Insufficient documentation

## 2018-05-11 DIAGNOSIS — C773 Secondary and unspecified malignant neoplasm of axilla and upper limb lymph nodes: Secondary | ICD-10-CM | POA: Diagnosis not present

## 2018-05-11 DIAGNOSIS — Z794 Long term (current) use of insulin: Secondary | ICD-10-CM | POA: Insufficient documentation

## 2018-05-11 DIAGNOSIS — I427 Cardiomyopathy due to drug and external agent: Secondary | ICD-10-CM | POA: Diagnosis not present

## 2018-05-11 DIAGNOSIS — Z79899 Other long term (current) drug therapy: Secondary | ICD-10-CM | POA: Diagnosis not present

## 2018-05-11 DIAGNOSIS — K219 Gastro-esophageal reflux disease without esophagitis: Secondary | ICD-10-CM | POA: Insufficient documentation

## 2018-05-11 DIAGNOSIS — Z8249 Family history of ischemic heart disease and other diseases of the circulatory system: Secondary | ICD-10-CM | POA: Insufficient documentation

## 2018-05-11 DIAGNOSIS — E785 Hyperlipidemia, unspecified: Secondary | ICD-10-CM | POA: Insufficient documentation

## 2018-05-11 DIAGNOSIS — T451X5A Adverse effect of antineoplastic and immunosuppressive drugs, initial encounter: Secondary | ICD-10-CM

## 2018-05-11 LAB — BASIC METABOLIC PANEL
Anion gap: 7 (ref 5–15)
BUN: 11 mg/dL (ref 6–20)
CO2: 29 mmol/L (ref 22–32)
Calcium: 8.9 mg/dL (ref 8.9–10.3)
Chloride: 103 mmol/L (ref 98–111)
Creatinine, Ser: 0.91 mg/dL (ref 0.44–1.00)
GFR calc non Af Amer: >60 mL/min (ref >60)
Glucose, Bld: 162 mg/dL — ABNORMAL HIGH (ref 70–99)
Potassium: 5 mmol/L (ref 3.5–5.1)
Sodium: 139 mmol/L (ref 135–145)

## 2018-05-11 MED ORDER — CARVEDILOL 6.25 MG PO TABS: 6.2500 mg | ORAL_TABLET | Freq: Two times a day (BID) | ORAL | 6 refills | Status: DC

## 2018-05-11 NOTE — Patient Instructions (Addendum)
INCREASE Coreg to 6.25mg  (1 tab) twice a day  Labs today We will only contact you if something comes back abnormal or we need to make some changes. Otherwise no news is good news!  Your physician recommends that you schedule a follow-up appointment in: Keep the April 16th @10am  appointment. An echo will be added to this appointment date.  Your physician has requested that you have an echocardiogram. Echocardiography is a painless test that uses sound waves to create images of your heart. It provides your doctor with information about the size and shape of your heart and how well your heart's chambers and valves are working. This procedure takes approximately one hour. There are no restrictions for this procedure.

## 2018-05-15 ENCOUNTER — Telehealth (HOSPITAL_COMMUNITY): Payer: Self-pay | Admitting: *Deleted

## 2018-05-15 NOTE — Telephone Encounter (Signed)
Patient referred to Clinical Exercise Physiologist by Lillia Mountain, NP for guidance and discussion about safe home exercises and/or starting an exercise program. Unavailable for meeting (due to other patient schedule) during scheduled clinic visit. Patient and NP requested a follow-up phone call to discuss exercise opportunities and/or plans. Patient did not answer, Left message with details and contact for information to return call.    Landis Martins, MS, ACSM-RCEP Clinical Exercise Physiologist

## 2018-05-21 DIAGNOSIS — E113211 Type 2 diabetes mellitus with mild nonproliferative diabetic retinopathy with macular edema, right eye: Secondary | ICD-10-CM | POA: Diagnosis not present

## 2018-05-21 DIAGNOSIS — H5213 Myopia, bilateral: Secondary | ICD-10-CM | POA: Diagnosis not present

## 2018-05-25 DIAGNOSIS — Z794 Long term (current) use of insulin: Secondary | ICD-10-CM | POA: Diagnosis not present

## 2018-05-25 DIAGNOSIS — E109 Type 1 diabetes mellitus without complications: Secondary | ICD-10-CM | POA: Diagnosis not present

## 2018-06-05 ENCOUNTER — Telehealth: Payer: Self-pay | Admitting: *Deleted

## 2018-06-05 NOTE — Telephone Encounter (Signed)
[employer] [id number] Study LVM for patient to reschedule her study visit on April 20th.  Informed patient our clinic is trying to reschedule non essential visits as this time to try to help prevent the spread of the Corona Virus.  Informed patient the study window for this visit ends on June 29th.  Asked patient to return call to reschedule visit to May or June if possible.  Foye Spurling, BSN, RN Clinical Research Nurse 06/05/2018 1:48 PM

## 2018-06-25 ENCOUNTER — Encounter (HOSPITAL_COMMUNITY): Payer: Self-pay

## 2018-06-28 ENCOUNTER — Other Ambulatory Visit (HOSPITAL_COMMUNITY): Payer: Self-pay | Admitting: Internal Medicine

## 2018-06-30 ENCOUNTER — Telehealth: Payer: Self-pay | Admitting: Adult Health

## 2018-06-30 NOTE — Telephone Encounter (Signed)
Changed appt to webex visit per sch msg. Called and spoke with patient. Confirmed ok with video visit

## 2018-07-09 ENCOUNTER — Encounter (HOSPITAL_COMMUNITY): Payer: BLUE CROSS/BLUE SHIELD | Admitting: Internal Medicine

## 2018-07-09 ENCOUNTER — Ambulatory Visit (HOSPITAL_COMMUNITY): Payer: BLUE CROSS/BLUE SHIELD

## 2018-07-10 ENCOUNTER — Telehealth: Payer: Self-pay | Admitting: Adult Health

## 2018-07-10 NOTE — Progress Notes (Signed)
SURVIVORSHIP VIRTUAL VISIT:  I connected with Stephanie Holden on 07/10/18 at 10:00 AM EDT by video and verified that I am speaking with the correct person using two identifiers.   I discussed the limitations, risks, security and privacy concerns of performing an evaluation and management service by telephone and the availability of in person appointments. I also discussed with the patient that there may be a patient responsible charge related to this service. The patient expressed understanding and agreed to proceed.   BRIEF ONCOLOGIC HISTORY:    Malignant neoplasm of axillary tail of right breast in female, estrogen receptor positive (Stephanie Holden)   05/09/2017 Initial Diagnosis    May 2018: Palpable right axillary mass.  The recommended a 6-week follow-up but apparently the mass decreased in size.  Recent increase in the mass was noted, biopsy revealed Grade 1 IDC with DCIS, lymphovascular invasion present, 4 small hypoechoic masses largest 0.9 cm, T1BN1 stage Ib clinical stage    06/05/2017 Surgery    Bilateral mastectomies: Right mastectomy: IDC grade 1, 3 foci 1.2 cm, 1.2 cm, 1 cm, IgA DCIS, LVH diffuse present, margins negative, 4/7 lymph nodes positive with extracapsular extension, ER 95%, PR 95%, HER-2 negative, Ki-67 2% to 10%, T1 cN2 aM0 stage II a pathological stage; left mastectomy: Benign    06/26/2017 Surgery    Axillary lymph node dissection: 2/22 lymph nodes positive (making a total of 6+ lymph nodes)    07/22/2017 - 12/09/2017 Chemotherapy    Adjuvant chemotherapy with dose dense Adriamycin and Cytoxan x4 followed by Taxol weekly x12     12/23/2017 Cancer Staging    Staging form: Breast, AJCC 8th Edition - Pathologic: Stage IB (pT1c, pN2a, cM0, G1, ER+, PR+, HER2-) - Signed by Eppie Gibson, MD on 12/23/2017    12/30/2017 - 02/09/2018 Radiation Therapy    Adjuvant radiation    01/12/2018 Imaging    No PE, patchy consolidation in both lungs groundglass opacities, differential infection versus  radiation pneumonitis, right pleural effusion moderate to large, left pleural effusion moderate    01/27/2018 - 02/01/2018 Hospital Admission    Hospital admission for congestive heart failure with pleural effusion, EF 25% suspected to be related to Adriamycin    03/25/2018 -  Anti-estrogen oral therapy    Tamoxifen daily     Breast cancer metastasized to axillary lymph node, right ([place])   06/05/2017 Initial Diagnosis    Breast cancer metastasized to axillary lymph node, right ([place])    07/22/2017 -  Chemotherapy    Adjuvant chemotherapy with dose dense Adriamycin and Cytoxan x4 followed by Taxol weekly x12      INTERVAL HISTORY:  [name] to review her survivorship care plan detailing her treatment course for breast cancer, as well as monitoring long-term side effects of that treatment, education regarding health maintenance, screening, and overall wellness and health promotion.     Overall, [name] reports feeling moderately well since finishing up her treatments.  She says that the isolation from coronavirus has been more adjusting to having her family members join her at home.  She does work from home.  She says that physically it was easier to recover from her breast cancer diagnosis and treatments than it was emotionally.   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  She notes that her last EF was improved to 37%.  She is seeing Dr. Haroldine Laws regularly and is taking her cardiac medications as prescribed.   Stephanie Holden notes she also has chemotherapy induced peripheral  neuropathy.  She says that she has numbness in her fingertips and feet.  She was taking Gabapentin, but she isn't any longer.  She notes that the numbness isn't painful, it is just a decreased sensation.    Stephanie Holden is taking Tamoxifen daily.  She is tolerating this well.  She denies hot flashes, vaginal discharge, or arthralgias.  She notes that she is taking Effexor daily and started this as  an anti depressant.  She thinks this is why she is tolerating the Tamoxifen well also.    [name] had 22 lymph nodes removed.  She does have some right sided lymphedema.  She is wearing her sleeve and notes that she can see an increase if she pushes herself too hard at the gymn.  She is exercising 5 days per week, and lifts weights Monday, Wednesday, and Friday, and does cardio on Tuesday and Thursday.      REVIEW OF SYSTEMS:  Review of Systems  Constitutional: Negative for appetite change, chills, fatigue, fever and unexpected weight change.  HENT:   Negative for hearing loss, lump/mass, sore throat and trouble swallowing.   Eyes: Negative for eye problems and icterus.  Respiratory: Negative for chest tightness, cough and shortness of breath.   Cardiovascular: Negative for chest pain, leg swelling and palpitations.  Gastrointestinal: Negative for abdominal distention, abdominal pain, constipation, diarrhea, nausea and vomiting.  Endocrine: Negative for hot flashes.  Skin: Negative for itching and rash.  Neurological: Negative for dizziness, headaches and numbness.  Hematological: Negative for adenopathy. Does not bruise/bleed easily.  Psychiatric/Behavioral: Negative for depression. The patient is not nervous/anxious.   Breast: Denies any new nodularity, masses, tenderness, nipple changes, or nipple discharge.      ONCOLOGY TREATMENT TEAM:  1. Surgeon:  Dr. Barry Dienes at [facility] 2. Medical Oncologist: Dr. Lindi Adie  3. Radiation Oncologist: Dr. Isidore Moos    PAST MEDICAL/SURGICAL HISTORY:  Past Medical History:  Diagnosis Date  . Cancer [facility])    Breast cancer - right  . CHF (congestive heart failure) ([place])   . Constipation   . Depression   . Genetic testing 05/27/2017   Breast/GYN panel (23 genes) @ [facility] - No pathogenic mutations detected  . GERD (gastroesophageal reflux disease)   . History of radiation therapy 12/30/17- 02/10/18   Right chest wall/ 50 Gy in 25  fractions, Right SCV, PAB nodes/ 50 Gy in 25 fractions, Right Chest wall boost 10 gy in 5 fractions.   . Insulin dependent type 1 diabetes mellitus ([place])   . Insulin pump in place   . PONV (postoperative nausea and vomiting)   . Stenosing tenosynovitis of thumb 06/2014   right  . Thyroid goiter    Past Surgical History:  Procedure Laterality Date  . AXILLARY LYMPH NODE DISSECTION Right 06/26/2017   Procedure: AXILLARY LYMPH NODE DISSECTION;  Surgeon: [name], MD;  Location: [place];  Service: General;  Laterality: Right;  . CARPAL TUNNEL RELEASE Bilateral 10/28/2013   Procedure: BILATERAL CARPAL TUNNEL RELEASE;  Surgeon: Daryll Brod, MD;  Location: [place];  Service: Orthopedics;  Laterality: Bilateral;  . CESAREAN SECTION  2000; 08/20/2001  . DILATION AND CURETTAGE OF UTERUS    . ESSURE TUBAL LIGATION  2005   failed, tubal puncture  . LAPAROSCOPIC TUBAL LIGATION  04/02/2007   removal Ess[place]  . LAPAROSCOPIC UNILATERAL SALPINGECTOMY  2005  . MASS EXCISION Left 03/11/2018   Procedure: EXCISION LEFT CHEST WALL MASS;  Surgeon: [name], MD;  Location: [place];  Service: General;  Laterality: Left;  Marland Kitchen MASTECTOMY W/ SENTINEL NODE BIOPSY Bilateral 06/05/2017   Procedure: BILATERAL MASTECTOMIES  WITH RIGHT SENTINEL LYMPH NODE BIOPSY;  Surgeon: [name], MD;  Location: [place];  Service: General;  Laterality: Bilateral;  . PORT-A-CATH REMOVAL N/A 03/11/2018   Procedure: REMOVAL PORT-A-CATH;  Surgeon: [name], MD;  Location: [place];  Service: General;  Laterality: N/A;  . PORTACATH PLACEMENT Left 06/26/2017   Procedure: INSERTION PORT-A-CATH;  Surgeon: [name], MD;  Location: [place];  Service: General;  Laterality: Left;  . TRIGGER FINGER RELEASE Left 04/12/2014   Procedure: RELEASE A-1 PULLEY LEFT THUMB;  Surgeon: Daryll Brod, MD;  Location: [place];  Service: Orthopedics;  Laterality: Left;  . TRIGGER FINGER RELEASE Right 07/19/2014   Procedure:  RELEASE TRIGGER FINGER/A-1 PULLEY RIGHT THUMB;  Surgeon: Daryll Brod, MD;  Location: [place];  Service: Orthopedics;  Laterality: Right;  . TUBAL LIGATION       ALLERGIES:  Allergies  Allergen Reactions  . Dilaudid [Hydromorphone Hcl] Itching and Other (See Comments)    UNCONTROLLABLE CRYING     CURRENT MEDICATIONS:  Outpatient Encounter Medications as of 07/13/2018  Medication Sig Note  . carvedilol (COREG) 6.25 MG tablet Take 1 tablet (6.25 mg total) by mouth 2 (two) times daily with a meal.   . furosemide (LASIX) 40 MG tablet Take 40 mg by mouth as needed for fluid or edema.    . gabapentin (NEURONTIN) 300 MG capsule Take 1 capsule (300 mg total) by mouth at bedtime.   . insulin aspart (NOVOLOG) 100 UNIT/ML injection Inject 45 Units into the skin See admin instructions. Use via insulin pump up to 45 units daily 01/13/2018: Has on pump  . oxyCODONE-acetaminophen (PERCOCET/ROXICET) 5-325 MG tablet Take 1 tablet by mouth every 8 (eight) hours as needed for severe pain.   . potassium chloride SA (K-DUR,KLOR-CON) 20 MEQ tablet Take 10 mEq by mouth daily as needed (with furosemide).    . pravastatin (PRAVACHOL) 20 MG tablet Take 20 mg by mouth daily. Pt not sure of dose, but thinks it is 20.   . sacubitril-valsartan (ENTRESTO) 49-51 MG Take 1 tablet by mouth 2 (two) times daily.   Marland Kitchen spironolactone (ALDACTONE) 25 MG tablet TAKE 1/2 TABLET BY MOUTH EVERY DAY   . tamoxifen (NOLVADEX) 20 MG tablet Take 1 tablet (20 mg total) by mouth daily.   Marland Kitchen venlafaxine XR (EFFEXOR-XR) 75 MG 24 hr capsule TAKE 1 CAPSULE (75 MG TOTAL) BY MOUTH DAILY WITH BREAKFAST.    No facility-administered encounter medications on file as of 07/13/2018.      ONCOLOGIC FAMILY HISTORY:  Family History  Problem Relation Age of Onset  . Hypertension Mother   . Hypertension Father   . Breast cancer Other 31       paternal grandfather's niece     GENETIC COUNSELING/TESTING: See above  SOCIAL  HISTORY:  Social History   Socioeconomic History  . Marital status: Married    Spouse name: Not on file  . Number of children: Not on file  . Years of education: Not on file  . Highest education level: Not on file  Occupational History  . Not on file  Social Needs  . Financial resource strain: Not on file  . Food insecurity:    Worry: Not on file    Inability: Not on file  . Transportation needs:    Medical: No    Non-medical: No  Tobacco Use  .  Smoking status: Former Smoker    Last attempt to quit: 03/24/2001    Years since quitting: 17.3  . Smokeless tobacco: Never Used  Substance and Sexual Activity  . Alcohol use: Not Currently  . Drug use: No  . Sexual activity: Yes    Partners: Male    Birth control/protection: Surgical    Comment: tubal ligation & 1 tube removed  Lifestyle  . Physical activity:    Days per week: Not on file    Minutes per session: Not on file  . Stress: Not on file  Relationships  . Social connections:    Talks on phone: Not on file    Gets together: Not on file    Attends religious service: Not on file    Active member of club or organization: Not on file    Attends meetings of clubs or organizations: Not on file    Relationship status: Not on file  . Intimate partner violence:    Fear of current or ex partner: No    Emotionally abused: No    Physically abused: No    Forced sexual activity: No  Other Topics Concern  . Not on file  Social History Narrative  . Not on file     OBSERVATIONS/OBJECTIVE:   Patient is well appearing, in no acute distress, wearing right arm lymphedema sleeve.  Her right arm is slightly larger than left.  Her behavior and mood are normal, and she has no rash.  Her breathing is non labored.    LABORATORY DATA:  None for this visit.  DIAGNOSTIC IMAGING:  None for this visit.      ASSESSMENT AND PLAN:  Ms.. Ketterman is a pleasant 43 y.o. female with Stage IB right breast invasive ductal carcinoma,  ER+/PR+/HER2-, diagnosed in 04/2017, treated with bilateral mastectomies, adjuvant chemotherapy, and, and anti-estrogen therapy with Tamoxifen beginning in 03/2018.  She presents to the Survivorship Clinic for our initial meeting and routine follow-up post-completion of treatment for breast cancer.    1. Stage IB right/left breast cancer:  Ms. Koper is continuing to recover from definitive treatment for breast cancer. She will follow-up with her medical oncologist, Dr. Lindi Adie in  with history and physical exam per surveillance protocol.  She will continue her anti-estrogen therapy with Tamoxifen. Thus far, she is tolerating the Tamoxifen well, with minimal side effects. She was instructed to make Dr. Lindi Adie or myself aware if she begins to experience any worsening side effects of the medication and I could see her back in clinic to help manage those side effects, as needed.. Today, a comprehensive survivorship care plan and treatment summary was reviewed with the patient today detailing her breast cancer diagnosis, treatment course, potential late/long-term effects of treatment, appropriate follow-up care with recommendations for the future, and patient education resources.  A copy of this summary, along with a letter will be sent to the patient's primary care provider via mail/fax/In Basket message after today's visit.    She is on study and was supposed to have research labs drawn today.  I will reach out to cameo her research RN today.    2. Chemotherapy induced cardiomyopathy: stable to improved.  She is following with Dr. Haroldine Laws and will see him again in 08/2018.  3.  Chemotherapy induced peripheral neuropathy: This is stable and she is not taking Gabapentin any longer.  Should it worsen, we can consider restarting the Gabapentin, or referring her to physical therapy.  4. Right arm lymphdema:  She  is managing this well and is wearing her sleeve.  She understands lymphdema precautions and avoiding  blood pressures and sticks in that arm.    5. Bone health:  Given Ms. Enslow's history of breast cancer, she is at slight risk for bone demineralization.  She has not yet undergone DEXA testing, and I would recommend this once she gets to be in her 57s (so long as she is post menopausal--as she has no FH of osteoporosis).  I counseled her that the Tamoxifen that she is taking has a protective effect on her bones.  She was given education on specific activities to promote bone health.  6. Cancer screening:  Due to Ms. Kendzierski's history and her age, she should receive screening for skin cancers, colon cancer, and gynecologic cancers.  The information and recommendations are listed on the patient's comprehensive care plan/treatment summary and were reviewed in detail with the patient.    7. Health maintenance and wellness promotion: Ms. Wain was encouraged to consume 5-7 servings of fruits and vegetables per day. We reviewed the "Nutrition Rainbow" handout, as well as the handout "Take Control of Your Health and Reduce Your Cancer Risk" from the [facility].  She was also encouraged to engage in moderate to vigorous exercise for 30 minutes per day most days of the week. We discussed the [employer] fitness program, which is designed for cancer survivors to help them become more physically fit after cancer treatments.  She was instructed to limit her alcohol consumption and continue to abstain from tobacco use.     8. Support services/counseling: It is not uncommon for this period of the patient's cancer care trajectory to be one of many emotions and stressors.  We discussed how this can be increasingly difficult during the times of quarantine and social distancing due to the COVID-19 pandemic.   She was given information regarding our available services and encouraged to contact me with any questions or for help enrolling in any of our support group/programs.    Follow up instructions:     -Return to cancer center in 12/2018 for f/u with Dr. Lindi Adie  -Follow up with Dr. Barry Dienes in  08/2018 -She is welcome to return back to the Survivorship Clinic at any time; no additional follow-up needed at this time.  -Consider referral back to survivorship as a long-term survivor for continued surveillance  The patient was provided an opportunity to ask questions and all were answered. The patient agreed with the plan and demonstrated an understanding of the instructions.   The patient was advised to call back or seek an in-person evaluation if the symptoms worsen or if the condition fails to improve as anticipated.   A total of (40) minutes of face-to-face time was spent with this patient with greater than 50% of that time in counseling and care-coordination as per my assessment and plan.   Scot Dock, NP

## 2018-07-10 NOTE — Telephone Encounter (Signed)
Called patient regarding Webex appointment, patient is aware and e-mail will be sent.

## 2018-07-13 ENCOUNTER — Encounter: Payer: BLUE CROSS/BLUE SHIELD | Admitting: *Deleted

## 2018-07-13 ENCOUNTER — Encounter: Payer: Self-pay | Admitting: Adult Health

## 2018-07-13 ENCOUNTER — Other Ambulatory Visit: Payer: BLUE CROSS/BLUE SHIELD

## 2018-07-13 ENCOUNTER — Inpatient Hospital Stay: Payer: BLUE CROSS/BLUE SHIELD | Attending: Adult Health | Admitting: Adult Health

## 2018-07-13 DIAGNOSIS — Z170 Estrogen receptor positive status [ER+]: Secondary | ICD-10-CM

## 2018-07-13 DIAGNOSIS — Z79810 Long term (current) use of selective estrogen receptor modulators (SERMs): Secondary | ICD-10-CM

## 2018-07-13 DIAGNOSIS — Z87891 Personal history of nicotine dependence: Secondary | ICD-10-CM

## 2018-07-13 DIAGNOSIS — Z9221 Personal history of antineoplastic chemotherapy: Secondary | ICD-10-CM

## 2018-07-13 DIAGNOSIS — C50611 Malignant neoplasm of axillary tail of right female breast: Secondary | ICD-10-CM

## 2018-07-13 DIAGNOSIS — Z923 Personal history of irradiation: Secondary | ICD-10-CM

## 2018-07-13 DIAGNOSIS — Z794 Long term (current) use of insulin: Secondary | ICD-10-CM

## 2018-07-13 DIAGNOSIS — Z79899 Other long term (current) drug therapy: Secondary | ICD-10-CM

## 2018-07-14 ENCOUNTER — Telehealth: Payer: Self-pay | Admitting: *Deleted

## 2018-07-14 NOTE — Telephone Encounter (Signed)
LVM for patient to reschedule her 12 month research visit for the Upbeat Study.  Informed patient the visit window extends until 09/16/18 for this visit.  We can schedule visit activities in June prior to 6/24.  Asked patient to return call at her convenience.  Foye Spurling, BSN, RN Clinical Research Nurse 07/14/2018 10:19 AM

## 2018-07-30 ENCOUNTER — Telehealth: Payer: Self-pay | Admitting: *Deleted

## 2018-07-30 NOTE — Telephone Encounter (Signed)
Called patient to schedule 12 month Upbeat study activities.  Informed patient last day of study window is 6/23 and appt will take 1 to 2 hrs.  Patient agreed to study visit with research nurse on 09/07/18 at 9 am.  Thanked patient for her time and participation. Asked her to call if she needs to reschedule. Patient verbalized understanding.  Foye Spurling, BSN, RN Clinical Research Nurse 07/30/2018 10:39 AM

## 2018-08-07 DIAGNOSIS — E7800 Pure hypercholesterolemia, unspecified: Secondary | ICD-10-CM | POA: Diagnosis not present

## 2018-08-07 DIAGNOSIS — E10319 Type 1 diabetes mellitus with unspecified diabetic retinopathy without macular edema: Secondary | ICD-10-CM | POA: Diagnosis not present

## 2018-08-07 DIAGNOSIS — I5020 Unspecified systolic (congestive) heart failure: Secondary | ICD-10-CM | POA: Diagnosis not present

## 2018-08-07 DIAGNOSIS — C50411 Malignant neoplasm of upper-outer quadrant of right female breast: Secondary | ICD-10-CM | POA: Diagnosis not present

## 2018-08-15 ENCOUNTER — Other Ambulatory Visit: Payer: Self-pay | Admitting: Hematology and Oncology

## 2018-08-24 DIAGNOSIS — Z794 Long term (current) use of insulin: Secondary | ICD-10-CM | POA: Diagnosis not present

## 2018-08-24 DIAGNOSIS — E109 Type 1 diabetes mellitus without complications: Secondary | ICD-10-CM | POA: Diagnosis not present

## 2018-08-27 DIAGNOSIS — H3563 Retinal hemorrhage, bilateral: Secondary | ICD-10-CM | POA: Diagnosis not present

## 2018-08-27 DIAGNOSIS — E103312 Type 1 diabetes mellitus with moderate nonproliferative diabetic retinopathy with macular edema, left eye: Secondary | ICD-10-CM | POA: Diagnosis not present

## 2018-08-27 DIAGNOSIS — E103391 Type 1 diabetes mellitus with moderate nonproliferative diabetic retinopathy without macular edema, right eye: Secondary | ICD-10-CM | POA: Diagnosis not present

## 2018-08-27 DIAGNOSIS — H2513 Age-related nuclear cataract, bilateral: Secondary | ICD-10-CM | POA: Diagnosis not present

## 2018-09-01 ENCOUNTER — Telehealth: Payer: Self-pay | Admitting: *Deleted

## 2018-09-01 DIAGNOSIS — E109 Type 1 diabetes mellitus without complications: Secondary | ICD-10-CM | POA: Diagnosis not present

## 2018-09-01 DIAGNOSIS — Z794 Long term (current) use of insulin: Secondary | ICD-10-CM | POA: Diagnosis not present

## 2018-09-01 NOTE — Telephone Encounter (Signed)
LVM for patient to confirm her study visit next week for 12 month assessment on the Upbeat study.  Asked patient to please return call to research nurse to make sure she feels comfortable coming into the clinic next week for lab and research nurse visit.  Foye Spurling, BSN, RN Clinical Research Nurse 09/01/2018 11:07 AM

## 2018-09-02 ENCOUNTER — Telehealth: Payer: Self-pay | Admitting: *Deleted

## 2018-09-02 NOTE — Telephone Encounter (Signed)
Called patient to confirm her appointment on Monday at 9 am for 12 month Upbeat study visit. Patient confirmed she plans to keep this appointment and feels comfortable coming into the clinic. Reminded patient to fast for at least 3 hrs prior to lab appointment and she verbalized understanding.  Informed patient that another research nurse will be conducting the visit as we are taking turns working from home and I will not be in the office next week.  She verbalized understanding.  Thanked patient very much for her time and participation in this study.  Foye Spurling, BSN, RN Clinical Research Nurse 09/02/2018 9:35 AM

## 2018-09-07 ENCOUNTER — Other Ambulatory Visit: Payer: Self-pay

## 2018-09-07 ENCOUNTER — Inpatient Hospital Stay: Payer: BC Managed Care – PPO | Attending: Adult Health

## 2018-09-07 ENCOUNTER — Inpatient Hospital Stay: Payer: BC Managed Care – PPO

## 2018-09-07 VITALS — BP 111/71 | HR 73 | Ht 62.0 in | Wt 161.8 lb

## 2018-09-07 DIAGNOSIS — C50911 Malignant neoplasm of unspecified site of right female breast: Secondary | ICD-10-CM

## 2018-09-07 DIAGNOSIS — Z006 Encounter for examination for normal comparison and control in clinical research program: Secondary | ICD-10-CM

## 2018-09-07 LAB — RESEARCH LABS

## 2018-09-07 NOTE — Progress Notes (Signed)
[id number] UPBEAT 12 months  Patient arrived unaccompanied for her 12 month UPBEAT visit. She confirmed she was fasting and the required labs were drawn. The neurocognitive assessments were administered by this RN. The patient then completed the self-administered questionnaires. The patient scored a 4 on the depression screening. No further intervention needed.This RN reviewed them and checked for completeness. Her waist was measured at 39 inches. Please see encounter for vital signs which were collected per protocol. Patient then completed physical functions testing. Thanked patient for her time and continued participation. Johney Maine RN, BSN Clinical Research  09/07/18 11:00 AM

## 2018-09-10 ENCOUNTER — Encounter (HOSPITAL_COMMUNITY): Payer: Self-pay | Admitting: Internal Medicine

## 2018-09-10 ENCOUNTER — Other Ambulatory Visit: Payer: Self-pay

## 2018-09-10 ENCOUNTER — Ambulatory Visit (HOSPITAL_BASED_OUTPATIENT_CLINIC_OR_DEPARTMENT_OTHER)
Admission: RE | Admit: 2018-09-10 | Discharge: 2018-09-10 | Disposition: A | Discharge status: Home / Self Care | Payer: BC Managed Care – PPO | Source: Ambulatory Visit | Attending: Internal Medicine | Admitting: Internal Medicine

## 2018-09-10 ENCOUNTER — Ambulatory Visit (HOSPITAL_COMMUNITY)
Admission: RE | Admit: 2018-09-10 | Discharge: 2018-09-10 | Disposition: A | Discharge status: Home / Self Care | Payer: BC Managed Care – PPO | Source: Ambulatory Visit | Attending: Cardiology | Admitting: Cardiology

## 2018-09-10 ENCOUNTER — Telehealth: Payer: Self-pay | Admitting: *Deleted

## 2018-09-10 VITALS — BP 132/88 | HR 69 | Wt 168.2 lb

## 2018-09-10 DIAGNOSIS — Z170 Estrogen receptor positive status [ER+]: Secondary | ICD-10-CM | POA: Diagnosis not present

## 2018-09-10 DIAGNOSIS — K219 Gastro-esophageal reflux disease without esophagitis: Secondary | ICD-10-CM | POA: Insufficient documentation

## 2018-09-10 DIAGNOSIS — I5022 Chronic systolic (congestive) heart failure: Secondary | ICD-10-CM | POA: Diagnosis not present

## 2018-09-10 DIAGNOSIS — C50911 Malignant neoplasm of unspecified site of right female breast: Secondary | ICD-10-CM | POA: Diagnosis not present

## 2018-09-10 DIAGNOSIS — Z8249 Family history of ischemic heart disease and other diseases of the circulatory system: Secondary | ICD-10-CM | POA: Diagnosis not present

## 2018-09-10 DIAGNOSIS — Z87891 Personal history of nicotine dependence: Secondary | ICD-10-CM | POA: Insufficient documentation

## 2018-09-10 DIAGNOSIS — T451X5A Adverse effect of antineoplastic and immunosuppressive drugs, initial encounter: Secondary | ICD-10-CM | POA: Diagnosis not present

## 2018-09-10 DIAGNOSIS — E785 Hyperlipidemia, unspecified: Secondary | ICD-10-CM | POA: Diagnosis not present

## 2018-09-10 DIAGNOSIS — Z885 Allergy status to narcotic agent status: Secondary | ICD-10-CM | POA: Diagnosis not present

## 2018-09-10 DIAGNOSIS — Z79899 Other long term (current) drug therapy: Secondary | ICD-10-CM | POA: Insufficient documentation

## 2018-09-10 DIAGNOSIS — Z9221 Personal history of antineoplastic chemotherapy: Secondary | ICD-10-CM | POA: Insufficient documentation

## 2018-09-10 DIAGNOSIS — C773 Secondary and unspecified malignant neoplasm of axilla and upper limb lymph nodes: Secondary | ICD-10-CM | POA: Diagnosis not present

## 2018-09-10 DIAGNOSIS — F329 Major depressive disorder, single episode, unspecified: Secondary | ICD-10-CM | POA: Diagnosis not present

## 2018-09-10 DIAGNOSIS — Z9013 Acquired absence of bilateral breasts and nipples: Secondary | ICD-10-CM | POA: Insufficient documentation

## 2018-09-10 DIAGNOSIS — Z853 Personal history of malignant neoplasm of breast: Secondary | ICD-10-CM | POA: Insufficient documentation

## 2018-09-10 DIAGNOSIS — I5032 Chronic diastolic (congestive) heart failure: Secondary | ICD-10-CM | POA: Diagnosis not present

## 2018-09-10 DIAGNOSIS — E109 Type 1 diabetes mellitus without complications: Secondary | ICD-10-CM | POA: Insufficient documentation

## 2018-09-10 DIAGNOSIS — Z79810 Long term (current) use of selective estrogen receptor modulators (SERMs): Secondary | ICD-10-CM | POA: Insufficient documentation

## 2018-09-10 DIAGNOSIS — Z794 Long term (current) use of insulin: Secondary | ICD-10-CM | POA: Insufficient documentation

## 2018-09-10 DIAGNOSIS — C50611 Malignant neoplasm of axillary tail of right female breast: Secondary | ICD-10-CM | POA: Insufficient documentation

## 2018-09-10 DIAGNOSIS — Z9641 Presence of insulin pump (external) (internal): Secondary | ICD-10-CM | POA: Insufficient documentation

## 2018-09-10 LAB — BASIC METABOLIC PANEL
Anion gap: 7 (ref 5–15)
BUN: 9 mg/dL (ref 6–20)
CO2: 30 mmol/L (ref 22–32)
Calcium: 9.5 mg/dL (ref 8.9–10.3)
Chloride: 104 mmol/L (ref 98–111)
Creatinine, Ser: 0.97 mg/dL (ref 0.44–1.00)
GFR calc Af Amer: >60 mL/min (ref >60)
GFR calc non Af Amer: >60 mL/min (ref >60)
Glucose, Bld: 176 mg/dL — ABNORMAL HIGH (ref 70–99)
Potassium: 5 mmol/L (ref 3.5–5.1)
Sodium: 141 mmol/L (ref 135–145)

## 2018-09-10 NOTE — Progress Notes (Signed)
Advanced Heart Failure Clinic Note   Referring Physician: Dr Lindi Adie PCP: Tisovec, Fransico Him, MD PCP-Cardiologist: Glori Bickers, MD   HPI: Stephanie Holden is a 43 y.o. female with a history of type 1 DM on insulin pump, thyroid goiter, and breast cancer.     Malignant neoplasm of axillary tail of right breast in female, estrogen receptor positive ([place])   05/09/2017 Initial Diagnosis    May 2018: Palpable right axillary mass.  The recommended a 6-week follow-up but apparently the mass decreased in size.  Recent increase in the mass was noted, biopsy revealed Grade 1 IDC with DCIS, lymphovascular invasion present, 4 small hypoechoic masses largest 0.9 cm, T1BN1 stage Ib clinical stage      06/05/2017 Surgery    Bilateral mastectomies: Right mastectomy: IDC grade 1, 3 foci 1.2 cm, 1.2 cm, 1 cm, IgA DCIS, LVH diffuse present, margins negative, 4/7 lymph nodes positive with extracapsular extension, ER 95%, PR 95%, HER-2 negative, Ki-67 2% to 10%, T1 cN2 aM0 stage II a pathological stage; left mastectomy: Benign      06/26/2017 Surgery    Axillary lymph node dissection: 2/22 lymph nodes positive (making a total of 6+ lymph nodes)      07/22/2017 -  Chemotherapy    Adjuvant chemotherapy with dose dense Adriamycin and Cytoxan x4 followed by Taxol weekly x12   Dr Haroldine Laws saw her in 6/19 for the first time in the cardio-oncology clinic in 6/19. She had recently completed chemo and echo showed EF 60-65%. She was scheduled to see me in 6 months with repeat echo to ensure stability. Unfortunately she has been admitted 2x over the past month. Initially felt to have PNA but then found to have systolic HF with EF 16-10%. Course c/b large R pleural effusion. Initially treated with thoracentesis on 01/13/18 but then recurred so chest tube was placed. Effusion found to be transudative with negative cytology. Was diuresed in hospital and f/u arranged here.   Today she returns for HF follow  up.  Doing well. Remains active. No HF symptoms. Taking all medications Takes lasix as needed for volume overload.    cMRI 12/19 1.  Normal LV size with moderate diffuse hypokinesis, EF 37%. 2.  Normal RV size with mildly decreased systolic function, EF 96%. 3. No myocardial LGE, so no definitive evidence for prior myocardial infarction, infiltrative disease, or myocarditis.  ECHO 09/10/2018 EF 40-45% GLS 17.6.  Echo 01/28/18 EF 25-30% Grade II DD. Mild RV dysfunction Echo 6/19 EF 60-65% LS 11.90 GLS 18.7%  Review of systems complete and found to be negative unless listed in HPI.   Past Medical History:  Diagnosis Date  . Cancer [facility])    Breast cancer - right  . CHF (congestive heart failure) ([place])   . Constipation   . Depression   . Genetic testing 05/27/2017   Breast/GYN panel (23 genes) @ [facility] - No pathogenic mutations detected  . GERD (gastroesophageal reflux disease)   . History of radiation therapy 12/30/17- 02/10/18   Right chest wall/ 50 Gy in 25 fractions, Right SCV, PAB nodes/ 50 Gy in 25 fractions, Right Chest wall boost 10 gy in 5 fractions.   . Insulin dependent type 1 diabetes mellitus ([place])   . Insulin pump in place   . PONV (postoperative nausea and vomiting)   . Stenosing tenosynovitis of thumb 06/2014   right  . Thyroid goiter     Current Outpatient Medications  Medication Sig Dispense Refill  . carvedilol (COREG)  6.25 MG tablet Take 1 tablet (6.25 mg total) by mouth 2 (two) times daily with a meal. 60 tablet 6  . furosemide (LASIX) 40 MG tablet Take 40 mg by mouth as needed for fluid or edema.     . insulin aspart (NOVOLOG) 100 UNIT/ML injection Inject 45 Units into the skin See admin instructions. Use via insulin pump up to 45 units daily    . oxyCODONE-acetaminophen (PERCOCET/ROXICET) 5-325 MG tablet Take 1 tablet by mouth every 8 (eight) hours as needed for severe pain. (Patient not taking: Reported on 09/07/2018) 60 tablet 0  . potassium chloride SA  (K-DUR,KLOR-CON) 20 MEQ tablet Take 10 mEq by mouth daily as needed (with furosemide).     . pravastatin (PRAVACHOL) 20 MG tablet Take 20 mg by mouth daily. Pt not sure of dose, but thinks it is 20.    . sacubitril-valsartan (ENTRESTO) 49-51 MG Take 1 tablet by mouth 2 (two) times daily. 60 tablet 6  . spironolactone (ALDACTONE) 25 MG tablet TAKE 1/2 TABLET BY MOUTH EVERY DAY 45 tablet 1  . tamoxifen (NOLVADEX) 20 MG tablet Take 1 tablet (20 mg total) by mouth daily. 90 tablet 3  . venlafaxine XR (EFFEXOR-XR) 75 MG 24 hr capsule TAKE 1 CAPSULE (75 MG TOTAL) BY MOUTH DAILY WITH BREAKFAST. 90 capsule 0   No current facility-administered medications for this encounter.     Allergies  Allergen Reactions  . Dilaudid [Hydromorphone Hcl] Itching and Other (See Comments)    UNCONTROLLABLE CRYING      Social History   Socioeconomic History  . Marital status: Married    Spouse name: Not on file  . Number of children: Not on file  . Years of education: Not on file  . Highest education level: Not on file  Occupational History  . Not on file  Social Needs  . Financial resource strain: Not on file  . Food insecurity    Worry: Not on file    Inability: Not on file  . Transportation needs    Medical: No    Non-medical: No  Tobacco Use  . Smoking status: Former Smoker    Quit date: 03/24/2001    Years since quitting: 17.4  . Smokeless tobacco: Never Used  Substance and Sexual Activity  . Alcohol use: Not Currently  . Drug use: No  . Sexual activity: Yes    Partners: Male    Birth control/protection: Surgical    Comment: tubal ligation & 1 tube removed  Lifestyle  . Physical activity    Days per week: Not on file    Minutes per session: Not on file  . Stress: Not on file  Relationships  . Social Herbalist on phone: Not on file    Gets together: Not on file    Attends religious service: Not on file    Active member of club or organization: Not on file    Attends  meetings of clubs or organizations: Not on file    Relationship status: Not on file  . Intimate partner violence    Fear of current or ex partner: No    Emotionally abused: No    Physically abused: No    Forced sexual activity: No  Other Topics Concern  . Not on file  Social History Narrative  . Not on file      Family History  Problem Relation Age of Onset  . Hypertension Mother   . Hypertension Father   . Breast  cancer Other 53       paternal grandfather's niece    Vitals:   09/10/18 1023  BP: 132/88  Pulse: 69  SpO2: 97%  Weight: 76.3 kg (168 lb 3.2 oz)   Wt Readings from Last 3 Encounters:  09/07/18 73.4 kg (161 lb 12.8 oz)  05/11/18 73.2 kg (161 lb 6.4 oz)  04/09/18 73.1 kg (161 lb 1.6 oz)    PHYSICAL EXAM: General:  Well appearing. No resp difficulty HEENT: normal Neck: supple. no JVD. Carotids 2+ bilat; no bruits. No lymphadenopathy or thryomegaly appreciated. Cor: PMI nondisplaced. Regular rate & rhythm. No rubs, gallops or murmurs. Lungs: clear Abdomen: soft, nontender, nondistended. No hepatosplenomegaly. No bruits or masses. Good bowel sounds. Extremities: no cyanosis, clubbing, rash, edema Neuro: alert & orientedx3, cranial nerves grossly intact. moves all 4 extremities w/o difficulty. Affect pleasant  ASSESSMENT & PLAN:  1. 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 She would like to proceed with CTA coronaries - NYHA I. Volume status looks good. Continue prn lasix - echo 6/19 EF 60% - echo 11/19 EF 25-30% - cMRI 12/19 EF 37%. No LGE. Out of window for ICD. -ECHO today reviewed 40-45% - Continue Entresto to 49/51 bid. - Continue spiro 12.5 mg daily. Recently had hyperkalemia to 5.3. Hold off on  increasing. Will recheck today - Continue coreg to 6.25 mg BID.   2. Malignant neoplasm of axillary tail of right breast in female, estrogen receptor positive  - Diagnosed with breast cancer 05/09/17. Underwent bilateral mastectomies on 06/05/17. Underwent axillary lymph node dissection on 06/26/17. Started chemo 07/22/17 with adriamycin and cytoxan x4, then taxol weekly x12. No finishing XRT on 02/09/18 - Echo 06/20/17: EF 55-60%, trivial TR. - Echo 6/19 EF 60-65% LS 11.90 GLS 18.7% - Echo 11/19 EF 25-30% suggestive of adriamycin cardiotoxicity.  - cMRI 12/19: EF 37%.   3. R pleural effusion - s/p chest tube drainage 11/19 - transudative. Cytology negative.   4. Hyperlipidemia - Her endocrinologist started her on pravastatin recently.   5. Type I, DM - Follows with Dr. Rene Kocher, MD  10:56 AM

## 2018-09-10 NOTE — Addendum Note (Signed)
Encounter addended by: Jovita Kussmaul, RN on: 09/10/2018 11:10 AM  Actions taken: Order list changed, Diagnosis association updated, Charge Capture section accepted, Clinical Note Signed

## 2018-09-10 NOTE — Addendum Note (Signed)
Encounter addended by: Harvie Junior, CMA on: 09/10/2018 11:06 AM  Actions taken: Order list changed, Diagnosis association updated

## 2018-09-10 NOTE — Patient Instructions (Signed)
Labs were done today. We will call you with any ABNORMAL results. No news is good news!  Your physician has requested that you have an echocardiogram. Echocardiography is a painless test that uses sound waves to create images of your heart. It provides your doctor with information about the size and shape of your heart and how well your heart's chambers and valves are working. This procedure takes approximately one hour. There are no restrictions for this procedure.  Dr Haroldine Laws would like you to complete a CT scan to evaluate the arteries around your heart.   Your physician recommends that you schedule a follow-up appointment in: 6 months with Dr. Haroldine Laws, this will be scheduled the same days as your Echo appointment.   At the [facility], you and your health needs are our priority. As part of our continuing mission to provide you with exceptional heart care, we have created designated Provider Care Teams. These Care Teams include your primary Cardiologist (physician) and Advanced Practice Providers (APPs- Physician Assistants and Nurse Practitioners) who all work together to provide you with the care you need, when you need it.   You may see any of the following providers on your designated Care Team at your next follow up: Marland Kitchen Dr Glori Bickers . Dr Loralie Champagne . Darrick Grinder, NP

## 2018-09-10 NOTE — Progress Notes (Signed)
  Echocardiogram 2D Echocardiogram has been performed.  Stephanie Holden 09/10/2018, 9:53 AM

## 2018-09-11 NOTE — Telephone Encounter (Signed)
12 month Upbeat Concomitant Medication Review: Reviewed current medication list with patient over the phone. Medications updated in EMR.   Informed patient we mailed her the $25 gift card for completing this visit of the study.  Apologized for her not getting it when she was in clinic. It was overlooked.  Informed her the study also gave her a tote bag that was also mailed. Asked patient to let us know if she does not receive the gift card and tote bag.   Thanked patient for her time and participation in this study.  Next visit due in 12 months and research will contact patient a few months in advance to schedule the appointments.  Patient verbalized understanding.  Foye Spurling, BSN, RN Clinical Research Nurse 09/10/18

## 2018-10-09 ENCOUNTER — Other Ambulatory Visit: Payer: Self-pay

## 2018-10-13 ENCOUNTER — Ambulatory Visit (INDEPENDENT_AMBULATORY_CARE_PROVIDER_SITE_OTHER): Payer: BC Managed Care – PPO | Admitting: Certified Nurse Midwife

## 2018-10-13 ENCOUNTER — Other Ambulatory Visit (HOSPITAL_COMMUNITY)
Admission: RE | Admit: 2018-10-13 | Discharge: 2018-10-13 | Disposition: A | Discharge status: Home / Self Care | Payer: BC Managed Care – PPO | Source: Ambulatory Visit | Attending: Certified Nurse Midwife | Admitting: Certified Nurse Midwife

## 2018-10-13 ENCOUNTER — Other Ambulatory Visit: Payer: Self-pay

## 2018-10-13 ENCOUNTER — Encounter: Payer: Self-pay | Admitting: Certified Nurse Midwife

## 2018-10-13 VITALS — BP 110/74 | HR 68 | Temp 97.2°F | Resp 16 | Ht 62.5 in | Wt 171.0 lb

## 2018-10-13 DIAGNOSIS — Z124 Encounter for screening for malignant neoplasm of cervix: Secondary | ICD-10-CM | POA: Insufficient documentation

## 2018-10-13 DIAGNOSIS — Z01419 Encounter for gynecological examination (general) (routine) without abnormal findings: Secondary | ICD-10-CM | POA: Diagnosis not present

## 2018-10-13 NOTE — Progress Notes (Signed)
43 y.o. [id number] Married Caucasian Fe here for annual exam. Periods none with chemotherapy. Had complication with chemo with collapsed lung. Recent exam and lung is up to 40%, improving.Sees Oncology for follow up, pulmonary and heart specialist. Emotionally working through depression. Effexor helping also. Family supportive, daughter helping at home.Some vaginal dryness using lubrication and no pain with sexual activity. No other health issues.  No LMP recorded. (Menstrual status: Chemotherapy).          Sexually active: Yes.    The current method of family planning is tubal ligation.    Exercising: Yes.    weights & cardio Smoker:  no  Review of Systems  Constitutional: Negative.   HENT: Negative.   Eyes: Negative.   Respiratory: Negative.   Cardiovascular: Negative.   Gastrointestinal: Negative.   Genitourinary: Negative.   Musculoskeletal: Negative.   Skin: Negative.   Neurological: Negative.   Endo/Heme/Allergies: Negative.   Psychiatric/Behavioral: Negative.     Health Maintenance: Pap:  10-09-17 neg HPV HR neg, 10-08-16 atypical glandular cells History of Abnormal Pap: yes MMG:  Breast cancer, see epic reports, mastectomy Self Breast exams: yes Colonoscopy:  none BMD:   Bone scan was done TDaP:  2013 Shingles: no Pneumonia: no Hep C and HIV: HIV neg 2019 Labs: no   reports that she quit smoking about 17 years ago. She has never used smokeless tobacco. She reports previous alcohol use. She reports that she does not use drugs.  Past Medical History:  Diagnosis Date  . Cancer [facility])    Breast cancer - right  . CHF (congestive heart failure) ([place])   . Constipation   . Depression   . Genetic testing 05/27/2017   Breast/GYN panel (23 genes) @ [facility] - No pathogenic mutations detected  . GERD (gastroesophageal reflux disease)   . History of radiation therapy 12/30/17- 02/10/18   Right chest wall/ 50 Gy in 25 fractions, Right SCV, PAB nodes/ 50 Gy in 25 fractions, Right  Chest wall boost 10 gy in 5 fractions.   . Insulin dependent type 1 diabetes mellitus ([place])   . Insulin pump in place   . PONV (postoperative nausea and vomiting)   . Stenosing tenosynovitis of thumb 06/2014   right  . Thyroid goiter     Past Surgical History:  Procedure Laterality Date  . AXILLARY LYMPH NODE DISSECTION Right 06/26/2017   Procedure: AXILLARY LYMPH NODE DISSECTION;  Surgeon: [name], MD;  Location: [place];  Service: General;  Laterality: Right;  . CARPAL TUNNEL RELEASE Bilateral 10/28/2013   Procedure: BILATERAL CARPAL TUNNEL RELEASE;  Surgeon: Daryll Brod, MD;  Location: [place];  Service: Orthopedics;  Laterality: Bilateral;  . CESAREAN SECTION  2000; 08/20/2001  . DILATION AND CURETTAGE OF UTERUS    . ESSURE TUBAL LIGATION  2005   failed, tubal puncture  . LAPAROSCOPIC TUBAL LIGATION  04/02/2007   removal Essure  . LAPAROSCOPIC UNILATERAL SALPINGECTOMY  2005  . MASS EXCISION Left 03/11/2018   Procedure: EXCISION LEFT CHEST WALL MASS;  Surgeon: [name], MD;  Location: [place];  Service: General;  Laterality: Left;  Marland Kitchen MASTECTOMY W/ SENTINEL NODE BIOPSY Bilateral 06/05/2017   Procedure: BILATERAL MASTECTOMIES  WITH RIGHT SENTINEL LYMPH NODE BIOPSY;  Surgeon: [name], MD;  Location: [place];  Service: General;  Laterality: Bilateral;  . PORT-A-CATH REMOVAL N/A 03/11/2018   Procedure: REMOVAL PORT-A-CATH;  Surgeon: [name], MD;  Location: [place];  Service: General;  Laterality: N/A;  . PORTACATH PLACEMENT  Left 06/26/2017   Procedure: INSERTION PORT-A-CATH;  Surgeon: [name], MD;  Location: [place];  Service: General;  Laterality: Left;  . TRIGGER FINGER RELEASE Left 04/12/2014   Procedure: RELEASE A-1 PULLEY LEFT THUMB;  Surgeon: Daryll Brod, MD;  Location: [facility];  Service: Orthopedics;  Laterality: Left;  . TRIGGER FINGER RELEASE Right 07/19/2014   Procedure: RELEASE TRIGGER FINGER/A-1 PULLEY RIGHT THUMB;  Surgeon: Daryll Brod, MD;  Location: [place];  Service: Orthopedics;  Laterality: Right;  . TUBAL LIGATION      Current Outpatient Medications  Medication Sig Dispense Refill  . carvedilol (COREG) 6.25 MG tablet Take 1 tablet (6.25 mg total) by mouth 2 (two) times daily with a meal. 60 tablet 6  . CONTOUR TEST test strip USE TO TEST BLOOD SUGARS 6 TIMES DAILY ON PUMP DX  E10.9    . furosemide (LASIX) 40 MG tablet Take 40 mg by mouth as needed for fluid or edema.     . insulin aspart (NOVOLOG) 100 UNIT/ML injection Inject 45 Units into the skin See admin instructions. Use via insulin pump up to 45 units daily    . potassium chloride SA (K-DUR,KLOR-CON) 20 MEQ tablet Take 10 mEq by mouth daily as needed (with furosemide).     . pravastatin (PRAVACHOL) 20 MG tablet Take 20 mg by mouth daily. Pt not sure of dose, but thinks it is 20.    . Probiotic Product (PROBIOTIC PO) Take by mouth.    . sacubitril-valsartan (ENTRESTO) 49-51 MG Take 1 tablet by mouth 2 (two) times daily. 60 tablet 6  . spironolactone (ALDACTONE) 25 MG tablet TAKE 1/2 TABLET BY MOUTH EVERY DAY 45 tablet 1  . tamoxifen (NOLVADEX) 20 MG tablet Take 1 tablet (20 mg total) by mouth daily. 90 tablet 3  . venlafaxine XR (EFFEXOR-XR) 75 MG 24 hr capsule TAKE 1 CAPSULE (75 MG TOTAL) BY MOUTH DAILY WITH BREAKFAST. 90 capsule 0   No current facility-administered medications for this visit.     Family History  Problem Relation Age of Onset  . Hypertension Mother   . Hypertension Father   . Breast cancer Other 47       paternal grandfather's niece    ROS:  Pertinent items are noted in HPI.  Otherwise, a comprehensive ROS was negative.  Exam:   BP 110/74   Pulse 68   Temp (!) 97.2 F (36.2 C) (Skin)   Resp 16   Ht 5' 2.5" (1.588 m)   Wt 171 lb (77.6 kg)   BMI 30.78 kg/m  Height: 5' 2.5" (158.8 cm) Ht Readings from Last 3 Encounters:  10/13/18 5' 2.5" (1.588 m)  09/07/18 5\' 2"  (1.575 m)  03/13/18 5\' 2"  (1.575 m)     General appearance: alert, cooperative and appears stated age Head: Normocephalic, without obvious abnormality, atraumatic, hair returning Neck: no adenopathy, supple, symmetrical, trachea midline and thyroid normal to inspection and palpation Lungs: clear to auscultation bilaterally Breasts: No axillary or supraclavicular adenopathy, bilateral mastectomy scars,no change in skin or unusual appearance Heart: regular rate and rhythm Abdomen: soft, non-tender; no masses,  no organomegaly Extremities: extremities normal, atraumatic, no cyanosis or edema Skin: Skin color, texture, turgor normal. No rashes or lesions Lymph nodes: Cervical, supraclavicular, and axillary nodes normal. No abnormal inguinal nodes palpated Neurologic: Grossly normal   Pelvic: External genitalia:  no lesions              Urethra:  normal appearing urethra with  no masses, tenderness or lesions              Bartholin's and Skene's: normal                 Vagina: normal appearing vagina with normal color and discharge, no lesions              Cervix: multiparous appearance, no bleeding following Pap, no cervical motion tenderness and no lesions              Pap taken: Yes.   Bimanual Exam:  Uterus:  normal size, contour, position, consistency, mobility, non-tender and anteverted              Adnexa: normal adnexa and no mass, fullness, tenderness               Rectovaginal: Confirms               Anus:  normal sphincter tone, no lesions  Chaperone present: yes  A:  Well Woman with normal exam  Amenorrhea secondary to chemotherapy  History of right breast cancer on oral medication now. History of respiratory complications with chemotherapy improving  Vaginal dryness, but using OTC moisture with no problem  Type 1 Diabetic with endocrine management stable    P:   Reviewed health and wellness pertinent to exam  Discussed importance of breast scar observation and skin change reporting  Continue follow up with  oncology as recommended  If has vaginal bleeding needs to call. Continue with OTC for dryness.  Continue follow up with MD  Pap smear: yes   counseled on breast self exam, feminine hygiene, adequate intake of calcium and vitamin D, diet and exercise  return annually or prn  An After Visit Summary was printed and given to the patient.

## 2018-10-14 DIAGNOSIS — M79642 Pain in left hand: Secondary | ICD-10-CM | POA: Diagnosis not present

## 2018-10-14 DIAGNOSIS — M65332 Trigger finger, left middle finger: Secondary | ICD-10-CM | POA: Diagnosis not present

## 2018-10-14 LAB — CYTOLOGY - PAP
Diagnosis: NEGATIVE
HPV: NOT DETECTED

## 2018-10-28 ENCOUNTER — Other Ambulatory Visit: Payer: Self-pay | Admitting: Hematology and Oncology

## 2018-11-10 DIAGNOSIS — C50411 Malignant neoplasm of upper-outer quadrant of right female breast: Secondary | ICD-10-CM | POA: Diagnosis not present

## 2018-11-10 DIAGNOSIS — E7800 Pure hypercholesterolemia, unspecified: Secondary | ICD-10-CM | POA: Diagnosis not present

## 2018-11-10 DIAGNOSIS — Z794 Long term (current) use of insulin: Secondary | ICD-10-CM | POA: Diagnosis not present

## 2018-11-10 DIAGNOSIS — E10319 Type 1 diabetes mellitus with unspecified diabetic retinopathy without macular edema: Secondary | ICD-10-CM | POA: Diagnosis not present

## 2018-11-16 DIAGNOSIS — Z23 Encounter for immunization: Secondary | ICD-10-CM | POA: Diagnosis not present

## 2018-11-16 DIAGNOSIS — E10319 Type 1 diabetes mellitus with unspecified diabetic retinopathy without macular edema: Secondary | ICD-10-CM | POA: Diagnosis not present

## 2018-11-16 DIAGNOSIS — E7800 Pure hypercholesterolemia, unspecified: Secondary | ICD-10-CM | POA: Diagnosis not present

## 2018-11-20 ENCOUNTER — Other Ambulatory Visit (HOSPITAL_COMMUNITY): Payer: Self-pay | Admitting: Internal Medicine

## 2018-11-23 DIAGNOSIS — G620 Drug-induced polyneuropathy: Secondary | ICD-10-CM | POA: Diagnosis not present

## 2018-11-23 DIAGNOSIS — R232 Flushing: Secondary | ICD-10-CM | POA: Diagnosis not present

## 2018-11-23 DIAGNOSIS — C50411 Malignant neoplasm of upper-outer quadrant of right female breast: Secondary | ICD-10-CM | POA: Diagnosis not present

## 2018-11-23 DIAGNOSIS — T451X5A Adverse effect of antineoplastic and immunosuppressive drugs, initial encounter: Secondary | ICD-10-CM | POA: Diagnosis not present

## 2018-12-02 DIAGNOSIS — E109 Type 1 diabetes mellitus without complications: Secondary | ICD-10-CM | POA: Diagnosis not present

## 2018-12-02 DIAGNOSIS — Z794 Long term (current) use of insulin: Secondary | ICD-10-CM | POA: Diagnosis not present

## 2018-12-04 ENCOUNTER — Other Ambulatory Visit (HOSPITAL_COMMUNITY): Payer: Self-pay

## 2018-12-04 MED ORDER — CARVEDILOL 6.25 MG PO TABS: 6.2500 mg | ORAL_TABLET | Freq: Two times a day (BID) | ORAL | 6 refills | Status: DC

## 2018-12-10 DIAGNOSIS — H2513 Age-related nuclear cataract, bilateral: Secondary | ICD-10-CM | POA: Diagnosis not present

## 2018-12-10 DIAGNOSIS — E103391 Type 1 diabetes mellitus with moderate nonproliferative diabetic retinopathy without macular edema, right eye: Secondary | ICD-10-CM | POA: Diagnosis not present

## 2018-12-10 DIAGNOSIS — E103312 Type 1 diabetes mellitus with moderate nonproliferative diabetic retinopathy with macular edema, left eye: Secondary | ICD-10-CM | POA: Diagnosis not present

## 2018-12-10 DIAGNOSIS — H3563 Retinal hemorrhage, bilateral: Secondary | ICD-10-CM | POA: Diagnosis not present

## 2019-01-04 ENCOUNTER — Other Ambulatory Visit (HOSPITAL_COMMUNITY): Payer: Self-pay

## 2019-01-04 MED ORDER — SPIRONOLACTONE 25 MG PO TABS: 12.5000 mg | ORAL_TABLET | Freq: Every day | ORAL | 1 refills | Status: DC

## 2019-01-07 ENCOUNTER — Telehealth: Payer: Self-pay | Admitting: Hematology and Oncology

## 2019-01-07 NOTE — Telephone Encounter (Signed)
VG PAL 10/20. Follow up moved from 10/20 to 10/22. Confirmed with patient.

## 2019-01-12 ENCOUNTER — Ambulatory Visit: Payer: BLUE CROSS/BLUE SHIELD | Admitting: Hematology and Oncology

## 2019-01-13 NOTE — Progress Notes (Signed)
Patient Care Team: Tisovec, Fransico Him, MD as PCP - General (Internal Medicine) Bensimhon, Shaune Pascal, MD as PCP - Cardiology (Cardiology) Eppie Gibson, MD as Attending Physician (Radiation Oncology) Nicholas Lose, MD as Consulting Physician (Hematology and Oncology) [name], MD as Consulting Physician (General Surgery)  DIAGNOSIS:    ICD-10-CM   1. Breast cancer metastasized to axillary lymph node, right ([place])  C50.911    C77.3     SUMMARY OF ONCOLOGIC HISTORY: Oncology History  Malignant neoplasm of axillary tail of right breast in female, estrogen receptor positive ([place])  05/09/2017 Initial Diagnosis   May 2018: Palpable right axillary mass.  The recommended a 6-week follow-up but apparently the mass decreased in size.  Recent increase in the mass was noted, biopsy revealed Grade 1 IDC with DCIS, lymphovascular invasion present, 4 small hypoechoic masses largest 0.9 cm, T1BN1 stage Ib clinical stage   06/05/2017 Surgery   Bilateral mastectomies: Right mastectomy: IDC grade 1, 3 foci 1.2 cm, 1.2 cm, 1 cm, IgA DCIS, LVH diffuse present, margins negative, 4/7 lymph nodes positive with extracapsular extension, ER 95%, PR 95%, HER-2 negative, Ki-67 2% to 10%, T1 cN2 aM0 stage II a pathological stage; left mastectomy: Benign   06/26/2017 Surgery   Axillary lymph node dissection: 2/22 lymph nodes positive (making a total of 6+ lymph nodes)   07/22/2017 - 12/09/2017 Chemotherapy   Adjuvant chemotherapy with dose dense Adriamycin and Cytoxan x4 followed by Taxol weekly x12    12/23/2017 Cancer Staging   Staging form: Breast, AJCC 8th Edition - Pathologic: Stage IB (pT1c, pN2a, cM0, G1, ER+, PR+, HER2-) - Signed by Eppie Gibson, MD on 12/23/2017   12/30/2017 - 02/09/2018 Radiation Therapy   Adjuvant radiation   01/12/2018 Imaging   No PE, patchy consolidation in both lungs groundglass opacities, differential infection versus radiation pneumonitis, right pleural effusion moderate to  large, left pleural effusion moderate   01/27/2018 - 02/01/2018 Hospital Admission   Hospital admission for congestive heart failure with pleural effusion, EF 25% suspected to be related to Adriamycin   03/25/2018 -  Anti-estrogen oral therapy   Tamoxifen daily   Breast cancer metastasized to axillary lymph node, right ([place])  06/05/2017 Initial Diagnosis   Breast cancer metastasized to axillary lymph node, right ([place])   07/22/2017 -  Chemotherapy   Adjuvant chemotherapy with dose dense Adriamycin and Cytoxan x4 followed by Taxol weekly x12      CHIEF COMPLIANT: Follow-up of right breast cancer on tamoxifen  INTERVAL HISTORY: Stephanie Holden is a 43 y.o. with above-mentioned history of right breast cancer treated with bilateral mastectomies, adjuvant chemotherapy, radiation, and who is currently on anti-estrogen therapy with tamoxifen. She presents to the clinic today for follow-up.   REVIEW OF SYSTEMS:   Constitutional: Denies fevers, chills or abnormal weight loss Eyes: Denies blurriness of vision Ears, nose, mouth, throat, and face: Denies mucositis or sore throat Respiratory: Denies cough, dyspnea or wheezes Cardiovascular: Denies palpitation, chest discomfort Gastrointestinal: Denies nausea, heartburn or change in bowel habits Skin: Denies abnormal skin rashes Lymphatics: Denies new lymphadenopathy or easy bruising Neurological: Denies numbness, tingling or new weaknesses Behavioral/Psych: Mood is stable, no new changes  Extremities: No lower extremity edema Breast: denies any pain or lumps or nodules in either breasts All other systems were reviewed with the patient and are negative.  I have reviewed the past medical history, past surgical history, social history and family history with the patient and they are unchanged from previous note.  ALLERGIES:  is allergic to dilaudid [hydromorphone hcl].  MEDICATIONS:  Current Outpatient Medications  Medication Sig Dispense Refill     carvedilol (COREG) 6.25 MG tablet Take 1 tablet (6.25 mg total) by mouth 2 (two) times daily with a meal. 60 tablet 6   CONTOUR TEST test strip USE TO TEST BLOOD SUGARS 6 TIMES DAILY ON PUMP DX  E10.9     ENTRESTO 49-51 MG TAKE 1 TABLET BY MOUTH TWICE A DAY 180 tablet 3   furosemide (LASIX) 40 MG tablet Take 40 mg by mouth as needed for fluid or edema.      insulin aspart (NOVOLOG) 100 UNIT/ML injection Inject 45 Units into the skin See admin instructions. Use via insulin pump up to 45 units daily     potassium chloride SA (K-DUR,KLOR-CON) 20 MEQ tablet Take 10 mEq by mouth daily as needed (with furosemide).      pravastatin (PRAVACHOL) 20 MG tablet Take 20 mg by mouth daily. Pt not sure of dose, but thinks it is 20.     Probiotic Product (PROBIOTIC PO) Take by mouth.     spironolactone (ALDACTONE) 25 MG tablet Take 0.5 tablets (12.5 mg total) by mouth daily. 45 tablet 1   tamoxifen (NOLVADEX) 20 MG tablet Take 1 tablet (20 mg total) by mouth daily. 90 tablet 3   venlafaxine XR (EFFEXOR-XR) 75 MG 24 hr capsule TAKE 1 CAPSULE (75 MG TOTAL) BY MOUTH DAILY WITH BREAKFAST. 90 capsule 0   No current facility-administered medications for this visit.     PHYSICAL EXAMINATION: ECOG PERFORMANCE STATUS: 1 - Symptomatic but completely ambulatory  Vitals:   01/14/19 1100  BP: 112/78  Pulse: 75  Resp: 19  Temp: 98.3 F (36.8 C)  SpO2: 100%   Filed Weights   01/14/19 1100  Weight: 179 lb 11.2 oz (81.5 kg)    GENERAL: alert, no distress and comfortable SKIN: skin color, texture, turgor are normal, no rashes or significant lesions EYES: normal, Conjunctiva are pink and non-injected, sclera clear OROPHARYNX: no exudate, no erythema and lips, buccal mucosa, and tongue normal  NECK: supple, thyroid normal size, non-tender, without nodularity LYMPH: no palpable lymphadenopathy in the cervical, axillary or inguinal LUNGS: clear to auscultation and percussion with normal breathing  effort HEART: regular rate & rhythm and no murmurs and no lower extremity edema ABDOMEN: abdomen soft, non-tender and normal bowel sounds MUSCULOSKELETAL: no cyanosis of digits and no clubbing  NEURO: alert & oriented x 3 with fluent speech, no focal motor/sensory deficits EXTREMITIES: No lower extremity edema BREAST: No palpable masses or nodules in either right or left breasts. No palpable axillary supraclavicular or infraclavicular adenopathy no breast tenderness or nipple discharge. (exam performed in the presence of a chaperone)  LABORATORY DATA:  I have reviewed the data as listed CMP Latest Ref Rng & Units 09/10/2018 05/11/2018 04/17/2018  Glucose 70 - 99 mg/dL 176(H) 162(H) 139(H)  BUN 6 - 20 mg/dL _0 Creatinine 0.44 - 1.00 mg/dL 0.97 0.91 0.81  Sodium 135 - 145 mmol/L 141 139 140  Potassium 3.5 - 5.1 mmol/L 5.0 5.0 4.5  Chloride 98 - 111 mmol/L 104 103 105  CO2 22 - 32 mmol/L _1 Calcium 8.9 - 10.3 mg/dL 9.5 8.9 9.0  Total Protein 6.5 - 8.1 g/dL - - -  Total Bilirubin 0.3 - 1.2 mg/dL - - -  Alkaline Phos 38 - 126 U/L - - -  AST 15 - 41 U/L - - -  0 - 44 U/L - - -  ° ° °Lab Results  °Component Value Date  ° WBC 4.7 03/04/2018  ° HGB 12.6 03/04/2018  ° HCT 41.6 03/04/2018  ° MCV 84.9 03/04/2018  ° PLT 283 03/04/2018  ° NEUTROABS 8.7 (H) 01/28/2018  ° ° °ASSESSMENT & PLAN:  °Breast cancer metastasized to axillary lymph node, right ([facility]) °06/05/2017:Bilateral mastectomies: Right mastectomy: IDC grade 1, 3 foci 1.2 cm, 1.2 cm, 1 cm, IgA DCIS, LVH diffuse present, margins negative, 4/7 lymph nodes positive with extracapsular extension, ER 95%, PR 95%, HER-2 negative, Ki-67 2% to 10%, T1 cN2 aM0 stage II a pathological stage; left mastectomy: Benign °  °Mammaprint done preoperatively: Low risk °Axillary lymph node dissection 06/26/2017: 2/22 lymph nodes positive (making a total of 6 lymph nodes that were positive) °  °Treatment plan: °1.  Because of  high risk features (6 LN  Pos), we recommended adjuvant systemic chemotherapy with dose dense Adriamycin and Cytoxan x4 followed by Taxol weekly x12  07/22/2017-12/09/2017 °3. Followed by radiation.  Started 12/30/2017 °4. Followed by adjuvant antiestrogen therapy with tamoxifen started 03/25/2018 °  °UPBEAT clinical trial (WF 97415): No toxicities related to the trial °--------------------------------------------------------------------------- °Acute congestive heart failure secondary to Adriamycin: Hospitalization 01/27/2018 to 02/01/2018: EF 25% °Follows with Dr. Bensimhon °Currently on Entresto and Lasix. ° °Tamoxifen toxicities: °1.  Mood swings °2.  Hot flashes °Markedly improved with Effexor.  However she forgot to take it for the last few days and she has gotten increasing symptoms ° °Breast cancer surveillance: °1.  Breast exam 01/14/2019: Benign bilateral mastectomy scar tissue no palpable lumps or nodules. °2.  Mammograms: Not indicated because she had bilateral mastectomies ° °Patient has picked up flyfishing and appears to be having a good time with her. ° °Return to clinic in 1 year for follow-up ° ° ° ° °No orders of the defined types were placed in this encounter. ° °The patient has a good understanding of the overall plan. she agrees with it. she will call with any problems that may develop before the next visit here. ° °Gudena, Vinay, MD °01/14/2019 ° °I, Molly Dorshimer am acting as scribe for Dr. Vinay Gudena. ° °I have reviewed the above documentation for accuracy and completeness, and I agree with the above. ° ° ° ° ° ° °

## 2019-01-14 ENCOUNTER — Inpatient Hospital Stay: Payer: BC Managed Care – PPO | Attending: Hematology and Oncology | Admitting: Hematology and Oncology

## 2019-01-14 ENCOUNTER — Other Ambulatory Visit: Payer: Self-pay

## 2019-01-14 DIAGNOSIS — C773 Secondary and unspecified malignant neoplasm of axilla and upper limb lymph nodes: Secondary | ICD-10-CM | POA: Diagnosis not present

## 2019-01-14 DIAGNOSIS — Z170 Estrogen receptor positive status [ER+]: Secondary | ICD-10-CM | POA: Diagnosis not present

## 2019-01-14 DIAGNOSIS — C50911 Malignant neoplasm of unspecified site of right female breast: Secondary | ICD-10-CM

## 2019-01-14 DIAGNOSIS — Z9013 Acquired absence of bilateral breasts and nipples: Secondary | ICD-10-CM | POA: Diagnosis not present

## 2019-01-14 MED ORDER — LORAZEPAM 0.5 MG PO TABS: 0.5000 mg | ORAL_TABLET | Freq: Every evening | ORAL | 3 refills | Status: DC | PRN

## 2019-01-14 NOTE — Assessment & Plan Note (Signed)
06/05/2017:Bilateral mastectomies: Right mastectomy: IDC grade 1, 3 foci 1.2 cm, 1.2 cm, 1 cm, IgA DCIS, LVH diffuse present, margins negative, 4/7 lymph nodes positive with extracapsular extension, ER 95%, PR 95%, HER-2 negative, Ki-67 2% to 10%, T1 cN2 aM0 stage II a pathological stage; left mastectomy: Benign  Mammaprint done preoperatively: Low risk Axillary lymph node dissection 06/26/2017: 2/22 lymph nodes positive(making a total of 6 lymph nodes that were positive)  Treatment plan: 1.Because ofher high risk features (6 LN Pos),werecommended adjuvant systemic chemotherapywithdose dense Adriamycin and Cytoxan x4 followed by Taxol weekly x12 07/22/2017-12/09/2017 3.Followed by radiation.  Started 12/30/2017 4.Followed by adjuvant antiestrogen therapywith tamoxifen started 03/25/2018  UPBEAT clinical trial ([facility] 97415):No toxicities related to the trial --------------------------------------------------------------------------- Acute congestive heart failure secondary to Adriamycin: Hospitalization 01/27/2018 to 02/01/2018: EF 25% Follows with Dr. Haroldine Laws Currently on Entresto and Lasix.  Tamoxifen toxicities:  Breast cancer surveillance: 1.  Breast exam 01/14/2019: Benign 2.  Mammograms: Not indicated because she had bilateral mastectomies  Return to clinic in 1 year for follow-up

## 2019-01-19 ENCOUNTER — Other Ambulatory Visit: Payer: Self-pay | Admitting: Hematology and Oncology

## 2019-01-21 DIAGNOSIS — E109 Type 1 diabetes mellitus without complications: Secondary | ICD-10-CM | POA: Diagnosis not present

## 2019-01-21 DIAGNOSIS — Z794 Long term (current) use of insulin: Secondary | ICD-10-CM | POA: Diagnosis not present

## 2019-02-01 DIAGNOSIS — E7800 Pure hypercholesterolemia, unspecified: Secondary | ICD-10-CM | POA: Diagnosis not present

## 2019-02-01 DIAGNOSIS — Z794 Long term (current) use of insulin: Secondary | ICD-10-CM | POA: Diagnosis not present

## 2019-02-01 DIAGNOSIS — E10319 Type 1 diabetes mellitus with unspecified diabetic retinopathy without macular edema: Secondary | ICD-10-CM | POA: Diagnosis not present

## 2019-02-01 DIAGNOSIS — C50411 Malignant neoplasm of upper-outer quadrant of right female breast: Secondary | ICD-10-CM | POA: Diagnosis not present

## 2019-02-05 ENCOUNTER — Telehealth (HOSPITAL_COMMUNITY): Payer: Self-pay | Admitting: Vascular Surgery

## 2019-02-05 NOTE — Telephone Encounter (Signed)
Left pt message that CCT has been moved from 11/23 to 11/19 @ 10:30 asked pt o call back to confirm  appt change

## 2019-02-11 ENCOUNTER — Ambulatory Visit (HOSPITAL_COMMUNITY): Payer: BC Managed Care – PPO

## 2019-02-15 ENCOUNTER — Ambulatory Visit (HOSPITAL_COMMUNITY): Payer: BC Managed Care – PPO

## 2019-02-24 ENCOUNTER — Encounter (HOSPITAL_COMMUNITY): Payer: Self-pay

## 2019-02-24 ENCOUNTER — Telehealth (HOSPITAL_COMMUNITY): Payer: Self-pay | Admitting: Emergency Medicine

## 2019-02-24 NOTE — Telephone Encounter (Signed)
Reaching out to patient to offer assistance regarding upcoming cardiac imaging study; pt verbalizes understanding of appt date/time, parking situation and where to check in, pre-test NPO status and medications ordered, and verified current allergies; name and call back number provided for further questions should they arise Morayma Godown RN Navigator Cardiac Imaging [facility] [phone] office [phone] cell 

## 2019-02-25 ENCOUNTER — Encounter (HOSPITAL_COMMUNITY): Payer: Self-pay

## 2019-02-25 ENCOUNTER — Ambulatory Visit (HOSPITAL_COMMUNITY)
Admission: RE | Admit: 2019-02-25 | Discharge: 2019-02-25 | Disposition: A | Discharge status: Home / Self Care | Payer: BC Managed Care – PPO | Source: Ambulatory Visit | Attending: Internal Medicine | Admitting: Internal Medicine

## 2019-02-25 ENCOUNTER — Other Ambulatory Visit: Payer: Self-pay

## 2019-02-25 DIAGNOSIS — I5032 Chronic diastolic (congestive) heart failure: Secondary | ICD-10-CM

## 2019-02-25 MED ORDER — NITROGLYCERIN 0.4 MG SL SUBL
0.8000 mg | SUBLINGUAL_TABLET | Freq: Once | SUBLINGUAL | Status: AC
  Administered 2019-02-25: 16:00:00 0.8 mg via SUBLINGUAL

## 2019-02-25 MED ORDER — IOHEXOL 350 MG/ML SOLN
100.0000 mL | Freq: Once | INTRAVENOUS | Status: AC | PRN
  Administered 2019-02-25: 100 mL via INTRAVENOUS

## 2019-02-25 MED ORDER — METOPROLOL TARTRATE 5 MG/5ML IV SOLN: 5.0000 mg | INTRAVENOUS | Status: DC | PRN

## 2019-02-25 MED ORDER — NITROGLYCERIN 0.4 MG SL SUBL
SUBLINGUAL_TABLET | SUBLINGUAL | Status: AC
  Filled 2019-02-25: qty 2

## 2019-03-01 LAB — POCT I-STAT CREATININE: Creatinine, Ser: 0.8 mg/dL (ref 0.44–1.00)

## 2019-03-04 DIAGNOSIS — Z794 Long term (current) use of insulin: Secondary | ICD-10-CM | POA: Diagnosis not present

## 2019-03-04 DIAGNOSIS — E109 Type 1 diabetes mellitus without complications: Secondary | ICD-10-CM | POA: Diagnosis not present

## 2019-03-05 ENCOUNTER — Other Ambulatory Visit: Payer: Self-pay | Admitting: Hematology and Oncology

## 2019-03-12 DIAGNOSIS — M79641 Pain in right hand: Secondary | ICD-10-CM | POA: Diagnosis not present

## 2019-03-15 ENCOUNTER — Ambulatory Visit (HOSPITAL_BASED_OUTPATIENT_CLINIC_OR_DEPARTMENT_OTHER)
Admission: RE | Admit: 2019-03-15 | Discharge: 2019-03-15 | Disposition: A | Discharge status: Home / Self Care | Payer: BC Managed Care – PPO | Source: Ambulatory Visit | Attending: Internal Medicine | Admitting: Internal Medicine

## 2019-03-15 ENCOUNTER — Ambulatory Visit (HOSPITAL_COMMUNITY)
Admission: RE | Admit: 2019-03-15 | Discharge: 2019-03-15 | Disposition: A | Discharge status: Home / Self Care | Payer: BC Managed Care – PPO | Source: Ambulatory Visit | Attending: Internal Medicine | Admitting: Internal Medicine

## 2019-03-15 ENCOUNTER — Other Ambulatory Visit: Payer: Self-pay

## 2019-03-15 ENCOUNTER — Encounter (HOSPITAL_COMMUNITY): Payer: Self-pay | Admitting: Internal Medicine

## 2019-03-15 VITALS — BP 120/70 | HR 77 | Wt 180.0 lb

## 2019-03-15 DIAGNOSIS — Z79810 Long term (current) use of selective estrogen receptor modulators (SERMs): Secondary | ICD-10-CM | POA: Insufficient documentation

## 2019-03-15 DIAGNOSIS — Z794 Long term (current) use of insulin: Secondary | ICD-10-CM | POA: Insufficient documentation

## 2019-03-15 DIAGNOSIS — R5383 Other fatigue: Secondary | ICD-10-CM

## 2019-03-15 DIAGNOSIS — I5022 Chronic systolic (congestive) heart failure: Secondary | ICD-10-CM | POA: Insufficient documentation

## 2019-03-15 DIAGNOSIS — Z923 Personal history of irradiation: Secondary | ICD-10-CM | POA: Insufficient documentation

## 2019-03-15 DIAGNOSIS — I5032 Chronic diastolic (congestive) heart failure: Secondary | ICD-10-CM | POA: Diagnosis not present

## 2019-03-15 DIAGNOSIS — Z87891 Personal history of nicotine dependence: Secondary | ICD-10-CM | POA: Diagnosis not present

## 2019-03-15 DIAGNOSIS — R0683 Snoring: Secondary | ICD-10-CM

## 2019-03-15 DIAGNOSIS — Z170 Estrogen receptor positive status [ER+]: Secondary | ICD-10-CM | POA: Insufficient documentation

## 2019-03-15 DIAGNOSIS — Z9641 Presence of insulin pump (external) (internal): Secondary | ICD-10-CM | POA: Diagnosis not present

## 2019-03-15 DIAGNOSIS — Z9221 Personal history of antineoplastic chemotherapy: Secondary | ICD-10-CM | POA: Diagnosis not present

## 2019-03-15 DIAGNOSIS — G4710 Hypersomnia, unspecified: Secondary | ICD-10-CM | POA: Diagnosis not present

## 2019-03-15 DIAGNOSIS — T451X5A Adverse effect of antineoplastic and immunosuppressive drugs, initial encounter: Secondary | ICD-10-CM | POA: Diagnosis not present

## 2019-03-15 DIAGNOSIS — Z885 Allergy status to narcotic agent status: Secondary | ICD-10-CM | POA: Diagnosis not present

## 2019-03-15 DIAGNOSIS — Z8249 Family history of ischemic heart disease and other diseases of the circulatory system: Secondary | ICD-10-CM | POA: Insufficient documentation

## 2019-03-15 DIAGNOSIS — F329 Major depressive disorder, single episode, unspecified: Secondary | ICD-10-CM | POA: Insufficient documentation

## 2019-03-15 DIAGNOSIS — Z79899 Other long term (current) drug therapy: Secondary | ICD-10-CM | POA: Insufficient documentation

## 2019-03-15 DIAGNOSIS — C50911 Malignant neoplasm of unspecified site of right female breast: Secondary | ICD-10-CM | POA: Diagnosis not present

## 2019-03-15 DIAGNOSIS — C50611 Malignant neoplasm of axillary tail of right female breast: Secondary | ICD-10-CM | POA: Diagnosis not present

## 2019-03-15 DIAGNOSIS — E109 Type 1 diabetes mellitus without complications: Secondary | ICD-10-CM | POA: Diagnosis not present

## 2019-03-15 DIAGNOSIS — E785 Hyperlipidemia, unspecified: Secondary | ICD-10-CM | POA: Diagnosis not present

## 2019-03-15 DIAGNOSIS — Z9013 Acquired absence of bilateral breasts and nipples: Secondary | ICD-10-CM | POA: Insufficient documentation

## 2019-03-15 DIAGNOSIS — C773 Secondary and unspecified malignant neoplasm of axilla and upper limb lymph nodes: Secondary | ICD-10-CM

## 2019-03-15 LAB — CBC
HCT: 44 % (ref 36.0–46.0)
Hemoglobin: 14.3 g/dL (ref 12.0–15.0)
MCH: 30.2 pg (ref 26.0–34.0)
MCHC: 32.5 g/dL (ref 30.0–36.0)
MCV: 92.8 fL (ref 80.0–100.0)
Platelets: 268 10*3/uL (ref 150–400)
RBC: 4.74 MIL/uL (ref 3.87–5.11)
RDW: 13.2 % (ref 11.5–15.5)
WBC: 5.7 10*3/uL (ref 4.0–10.5)
nRBC: 0 % (ref 0.0–0.2)

## 2019-03-15 LAB — BASIC METABOLIC PANEL
Anion gap: 9 (ref 5–15)
BUN: 12 mg/dL (ref 6–20)
CO2: 27 mmol/L (ref 22–32)
Calcium: 8.9 mg/dL (ref 8.9–10.3)
Chloride: 106 mmol/L (ref 98–111)
Creatinine, Ser: 0.87 mg/dL (ref 0.44–1.00)
GFR calc Af Amer: >60 mL/min (ref >60)
GFR calc non Af Amer: >60 mL/min (ref >60)
Glucose, Bld: 202 mg/dL — ABNORMAL HIGH (ref 70–99)
Potassium: 3.9 mmol/L (ref 3.5–5.1)
Sodium: 142 mmol/L (ref 135–145)

## 2019-03-15 LAB — T4, FREE: Free T4: 0.76 ng/dL (ref 0.61–1.12)

## 2019-03-15 LAB — TSH: TSH: 1.711 u[IU]/mL (ref 0.350–4.500)

## 2019-03-15 LAB — BRAIN NATRIURETIC PEPTIDE: B Natriuretic Peptide: 39.5 pg/mL (ref 0.0–100.0)

## 2019-03-15 NOTE — Progress Notes (Signed)
Height:  5'2"    Weight: 180 BMI: 32.4  Today's Date: 03/15/2019  STOP BANG RISK ASSESSMENT S (snore) Have you been told that you snore?     YES   T (tired) Are you often tired, fatigued, or sleepy during the day?   YES  O (obstruction) Do you stop breathing, choke, or gasp during sleep? NO   P (pressure) Do you have or are you being treated for high blood pressure? YES   B (BMI) Is your body index greater than 35 kg/m? NO   A (age) Are you 42 years old or older? NO   N (neck) Do you have a neck circumference greater than 16 inches?      G (gender) Are you a female? NO   TOTAL STOP/BANG "YES" ANSWERS 3                                                                       For Office Use Only              Procedure Order Form    YES to 3+ Stop Bang questions OR two clinical symptoms - patient qualifies for WatchPAT (CPT 95800)      Clinical Notes: Will consult Sleep Specialist and refer for management of therapy due to patient increased risk of Sleep Apnea. Ordering a sleep study due to the following two clinical symptoms: Excessive daytime sleepiness G47.10 / Loud snoring R06.83   Which test do you need, WP1 or WP300???  . Do you have access to a smart device containing the app stores?  If YES, then -->WP1

## 2019-03-15 NOTE — Progress Notes (Signed)
  Echocardiogram 2D Echocardiogram has been performed.  Stephanie Holden 03/15/2019, 10:40 AM

## 2019-03-15 NOTE — Patient Instructions (Signed)
Labs done today, we will notify you for abnormal results  Your provider has recommended that you have a home sleep study.  [employer] is the company that does these test.  They will contact you by phone and must speak with you before they can ship the equipment.  Once they have spoken with you they will send the equipment right to your home with instructions on how to set it up.  Once you have completed the test you just dispose of the equipment, the information is automatically uploaded to [place] via blue-tooth technology.  IF you have any questions or issues with the equipment please call the company directly at [phone].  If your test is positive for sleep apnea and you need a home CPAP machine you will be contacted by Dr Theodosia Blender office [facility]) to set this up.  Your physician recommends that you schedule a follow-up appointment in: 2-3 months  If you have any questions or concerns before your next appointment please send [place] a message through [place] or call our office at [phone].  At the [facility], you and your health needs are our priority. As part of our continuing mission to provide you with exceptional heart care, we have created designated Provider Care Teams. These Care Teams include your primary Cardiologist (physician) and Advanced Practice Providers (APPs- Physician Assistants and Nurse Practitioners) who all work together to provide you with the care you need, when you need it.   You may see any of the following providers on your designated Care Team at your next follow up: Marland Kitchen Dr Glori Bickers . Dr Loralie Champagne . Darrick Grinder, NP . Lyda Jester, PA . Audry Riles, PharmD   Please be sure to bring in all your medications bottles to every appointment.

## 2019-03-15 NOTE — Progress Notes (Signed)
Advanced Heart Failure Clinic Note   Referring Physician: Dr Lindi Adie PCP: Tisovec, Fransico Him, MD PCP-Cardiologist: Glori Bickers, MD   HPI: Stephanie Holden is a 43 y.o. female with a history of type 1 DM on insulin pump, thyroid goiter, and breast cancer.     Malignant neoplasm of axillary tail of right breast in female, estrogen receptor positive ([place])   05/09/2017 Initial Diagnosis    May 2018: Palpable right axillary mass.  The recommended a 6-week follow-up but apparently the mass decreased in size.  Recent increase in the mass was noted, biopsy revealed Grade 1 IDC with DCIS, lymphovascular invasion present, 4 small hypoechoic masses largest 0.9 cm, T1BN1 stage Ib clinical stage      06/05/2017 Surgery    Bilateral mastectomies: Right mastectomy: IDC grade 1, 3 foci 1.2 cm, 1.2 cm, 1 cm, IgA DCIS, LVH diffuse present, margins negative, 4/7 lymph nodes positive with extracapsular extension, ER 95%, PR 95%, HER-2 negative, Ki-67 2% to 10%, T1 cN2 aM0 stage II a pathological stage; left mastectomy: Benign      06/26/2017 Surgery    Axillary lymph node dissection: 2/22 lymph nodes positive (making a total of 6+ lymph nodes)      07/22/2017 -  Chemotherapy    Adjuvant chemotherapy with dose dense Adriamycin and Cytoxan x4 followed by Taxol weekly x12   We saw her in 6/19 for the first time in the cardio-oncology clinic in 6/19. She had recently completed chemo and echo showed EF 60-65%. Admitted in 10/19 with PNA but then found to have systolic HF with EF 38-88%. Course c/b large R pleural effusion. Initially treated with thoracentesis on 01/13/18 but then recurred so chest tube was placed. Effusion found to be transudative with negative cytology.   Today she returns for HF follow up. Feels ok but vary fatigued and hard to get out of bed in the am. Husband says she is snoring. No witnessed apnea. Once she gets going is fine. No marked SOB or edema. + daytime sleepiness BP  at home ~110/60  cMRI 12/19 1.  Normal LV size with moderate diffuse hypokinesis, EF 37%. 2.  Normal RV size with mildly decreased systolic function, EF 28%. 3. No myocardial LGE, so no definitive evidence for prior myocardial infarction, infiltrative disease, or myocarditis.  ECHO 09/10/2018 EF 40-45% GLS 17.6.  Echo 01/28/18 EF 25-30% Grade II DD. Mild RV dysfunction Echo 6/19 EF 60-65% LS 11.90 GLS 18.7%  Review of systems complete and found to be negative unless listed in HPI.   Past Medical History:  Diagnosis Date  . Cancer [facility])    Breast cancer - right  . CHF (congestive heart failure) ([place])   . Constipation   . Depression   . Genetic testing 05/27/2017   Breast/GYN panel (23 genes) @ [facility] - No pathogenic mutations detected  . GERD (gastroesophageal reflux disease)   . History of radiation therapy 12/30/17- 02/10/18   Right chest wall/ 50 Gy in 25 fractions, Right SCV, PAB nodes/ 50 Gy in 25 fractions, Right Chest wall boost 10 gy in 5 fractions.   . Insulin dependent type 1 diabetes mellitus ([place])   . Insulin pump in place   . PONV (postoperative nausea and vomiting)   . Stenosing tenosynovitis of thumb 06/2014   right  . Thyroid goiter     Current Outpatient Medications  Medication Sig Dispense Refill  . carvedilol (COREG) 6.25 MG tablet Take 1 tablet (6.25 mg total) by mouth 2 (two)  times daily with a meal. 60 tablet 6  . CONTOUR TEST test strip USE TO TEST BLOOD SUGARS 6 TIMES DAILY ON PUMP DX  E10.9    . ENTRESTO 49-51 MG TAKE 1 TABLET BY MOUTH TWICE A DAY 180 tablet 3  . furosemide (LASIX) 40 MG tablet Take 40 mg by mouth as needed for fluid or edema.     . insulin aspart (NOVOLOG) 100 UNIT/ML injection Inject 45 Units into the skin See admin instructions. Use via insulin pump up to 45 units daily    . LORazepam (ATIVAN) 0.5 MG tablet Take 1 tablet (0.5 mg total) by mouth at bedtime as needed for anxiety. 30 tablet 3  . potassium chloride SA (K-DUR,KLOR-CON) 20  MEQ tablet Take 10 mEq by mouth daily as needed (with furosemide).     . pravastatin (PRAVACHOL) 20 MG tablet Take 20 mg by mouth daily. Pt not sure of dose, but thinks it is 20.    . Probiotic Product (PROBIOTIC PO) Take by mouth.    . spironolactone (ALDACTONE) 25 MG tablet Take 0.5 tablets (12.5 mg total) by mouth daily. 45 tablet 1  . tamoxifen (NOLVADEX) 20 MG tablet TAKE 1 TABLET BY MOUTH EVERY DAY 90 tablet 3  . venlafaxine XR (EFFEXOR-XR) 75 MG 24 hr capsule TAKE 1 CAPSULE (75 MG TOTAL) BY MOUTH DAILY WITH BREAKFAST. 90 capsule 0   No current facility-administered medications for this encounter.    Allergies  Allergen Reactions  . Dilaudid [Hydromorphone Hcl] Itching and Other (See Comments)    UNCONTROLLABLE CRYING      Social History   Socioeconomic History  . Marital status: Married    Spouse name: Not on file  . Number of children: Not on file  . Years of education: Not on file  . Highest education level: Not on file  Occupational History  . Not on file  Tobacco Use  . Smoking status: Former Smoker    Quit date: 03/24/2001    Years since quitting: 17.9  . Smokeless tobacco: Never Used  Substance and Sexual Activity  . Alcohol use: Not Currently  . Drug use: No  . Sexual activity: Yes    Partners: Male    Birth control/protection: Surgical    Comment: tubal ligation & 1 tube removed  Other Topics Concern  . Not on file  Social History Narrative  . Not on file   Social Determinants of Health   Financial Resource Strain:   . Difficulty of Paying Living Expenses: Not on file  Food Insecurity:   . Worried About Charity fundraiser in the Last Year: Not on file  . Ran Out of Food in the Last Year: Not on file  Transportation Needs:   . Lack of Transportation (Medical): Not on file  . Lack of Transportation (Non-Medical): Not on file  Physical Activity:   . Days of Exercise per Week: Not on file  . Minutes of Exercise per Session: Not on file  Stress:     . Feeling of Stress : Not on file  Social Connections:   . Frequency of Communication with Friends and Family: Not on file  . Frequency of Social Gatherings with Friends and Family: Not on file  . Attends Religious Services: Not on file  . Active Member of Clubs or Organizations: Not on file  . Attends Archivist Meetings: Not on file  . Marital Status: Not on file  Intimate Partner Violence:   . Fear of  Current or Ex-Partner: Not on file  . Emotionally Abused: Not on file  . Physically Abused: Not on file  . Sexually Abused: Not on file      Family History  Problem Relation Age of Onset  . Hypertension Mother   . Hypertension Father   . Breast cancer Other 78       paternal grandfather's niece    Vitals:   03/15/19 1048  BP: 120/70  Pulse: 77  SpO2: 98%  Weight: 81.6 kg (180 lb)   Wt Readings from Last 3 Encounters:  03/15/19 81.6 kg (180 lb)  01/14/19 81.5 kg (179 lb 11.2 oz)  10/13/18 77.6 kg (171 lb)    PHYSICAL EXAM: General:  Well appearing. No resp difficulty HEENT: normal Neck: supple. no JVD. Carotids 2+ bilat; no bruits. No lymphadenopathy or thryomegaly appreciated. Cor: PMI nondisplaced. Regular rate & rhythm. No rubs, gallops or murmurs. Lungs: clear Abdomen: soft, nontender, nondistended. No hepatosplenomegaly. No bruits or masses. Good bowel sounds. Extremities: no cyanosis, clubbing, rash, edema Neuro: alert & orientedx3, cranial nerves grossly intact. moves all 4 extremities w/o difficulty. Affect pleasant   ASSESSMENT & PLAN:  1. 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 She would like to proceed with CTA coronaries - NYHA I. Volume status looks good. Continue prn lasix - echo 6/19 EF  60% - echo 11/19 EF 25-30% - cMRI 12/19 EF 37%. No LGE. Out of window for ICD. -ECHO today reviewed EF continues to improve EF 55% GLS - 17.1% - Continue Entresto to 49/51 bid. - Continue spiro 12.5 mg daily. - Continue coreg to 6.25 mg BID.  - Will not titrate meds today with ongoing fatigue - Labs today  2. Malignant neoplasm of axillary tail of right breast in female, estrogen receptor positive  - Diagnosed with breast cancer 05/09/17. Underwent bilateral mastectomies on 06/05/17. Underwent axillary lymph node dissection on 06/26/17. Started chemo 07/22/17 with adriamycin and cytoxan x4, then taxol weekly x12. Finished XRT on 02/09/18 - echoes as above  3. R pleural effusion - s/p chest tube drainage 11/19 - transudative. Cytology negative.   4. Hyperlipidemia - Her endocrinologist started her on pravastatin recently.   5. Type I, DM - Follows with Dr. Osborne Casco  6. Snoring and daytime sleepiness - check home PSG  F/u 2-3 months  Glori Bickers, MD  11:04 AM

## 2019-03-16 LAB — T3, FREE: T3, Free: 3 pg/mL (ref 2.0–4.4)

## 2019-05-21 ENCOUNTER — Other Ambulatory Visit: Payer: Self-pay | Admitting: Hematology and Oncology

## 2019-05-30 ENCOUNTER — Other Ambulatory Visit (HOSPITAL_COMMUNITY): Payer: Self-pay | Admitting: Internal Medicine

## 2019-06-11 ENCOUNTER — Encounter: Payer: Self-pay | Admitting: Certified Nurse Midwife

## 2019-06-16 ENCOUNTER — Encounter (HOSPITAL_COMMUNITY): Payer: BC Managed Care – PPO | Admitting: Internal Medicine

## 2019-06-16 ENCOUNTER — Other Ambulatory Visit (HOSPITAL_COMMUNITY): Payer: BC Managed Care – PPO

## 2019-06-24 ENCOUNTER — Telehealth: Payer: Self-pay | Admitting: *Deleted

## 2019-06-24 ENCOUNTER — Other Ambulatory Visit (HOSPITAL_COMMUNITY): Payer: Self-pay | Admitting: Hematology and Oncology

## 2019-06-24 DIAGNOSIS — Z006 Encounter for examination for normal comparison and control in clinical research program: Secondary | ICD-10-CM

## 2019-06-24 NOTE — Telephone Encounter (Signed)
Upbeat Study;  Informed patient of 24 months visit due on the study and she agreed to come in for study visit on Monday 07/12/19. Cardiac MRI scheduled at 8 am with lab and research visit to follow.  Reminded patient to fast for 3 hours before the lab appointment and we will call her the Friday before to remind her as well.  Thanked patient for her time and asked her to contact research nurse if she needs to reschedule this appointment. She verbalized understanding.  Foye Spurling, BSN, RN Clinical Research Nurse 06/24/2019 3:03 PM

## 2019-06-28 IMAGING — DX DG CHEST 1V PORT
1 series · 1 of 1 positions shown · non-contrast
Comparison: Prior radiograph from earlier the same day.

CLINICAL DATA: Initial evaluation for chest tube placement.

EXAM:
PORTABLE CHEST 1 VIEW

[chest ap]
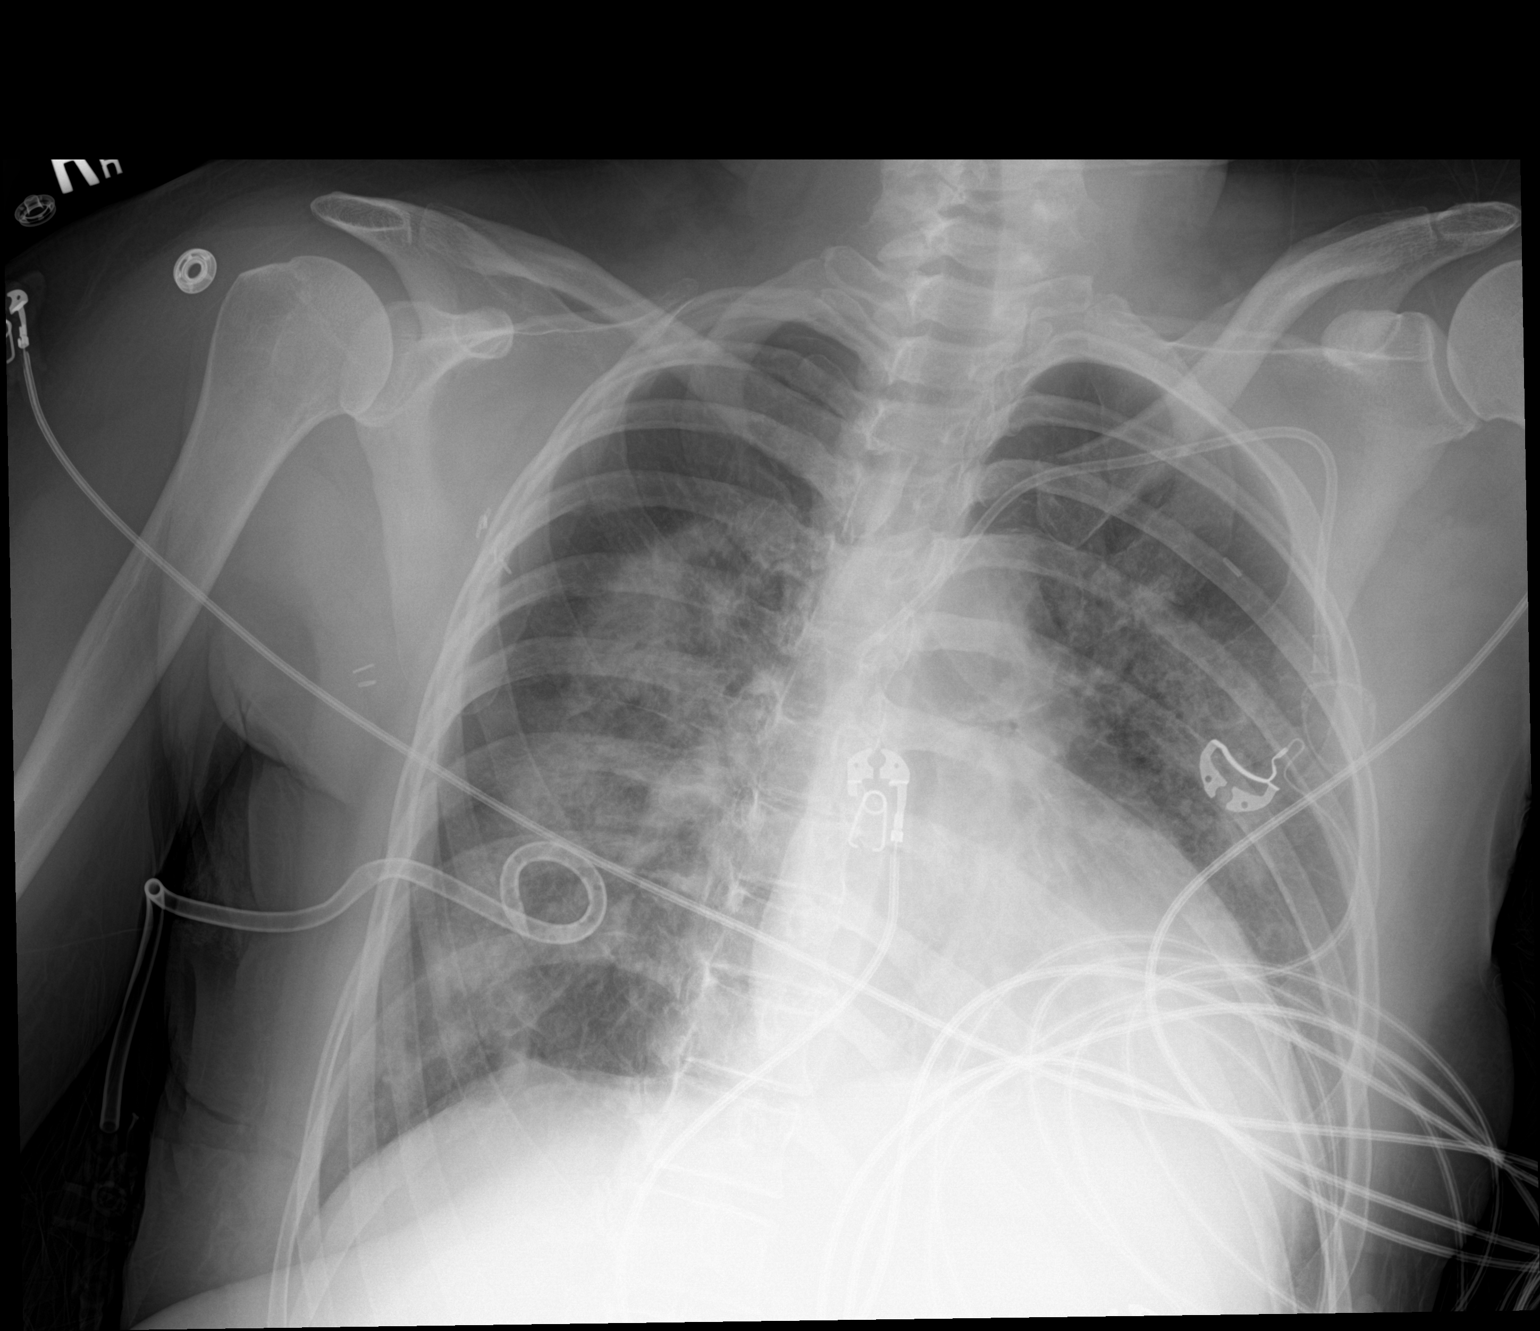

[1 of 1 positions shown; findings below may reference images not displayed]

FINDINGS: Cardiomegaly, stable. Mediastinal silhouette within normal limits.
Left-sided Port-A-Cath remains in place.

Interval placement of pigtail right-sided chest tube with tip
overlying the mid lower right lung. There is a trace apical
pneumothorax at the right lung apex, pleural edge at the third
intercostal space. Patchy bibasilar opacities, right greater than
left again seen, stable from previous. No appreciable pleural
effusion. Underlying pulmonary vascular congestion without overt
pulmonary edema.

Osseous structures unchanged.
IMPRESSION: 1. Interval placement of pigtail right-sided chest tube with tip
overlying the mid right lower lobe. Tiny right apical pneumothorax
as above.
2. Otherwise no significant interval change in multifocal bilateral
airspace opacities, right greater than left.

## 2019-06-28 IMAGING — CR DG CHEST 2V
2 series · 2 of 2 positions shown · non-contrast
Comparison: January 14, 2018

CLINICAL DATA: Recent pneumonia and pleural effusions

EXAM:
CHEST - 2 VIEW

[w chest pa]
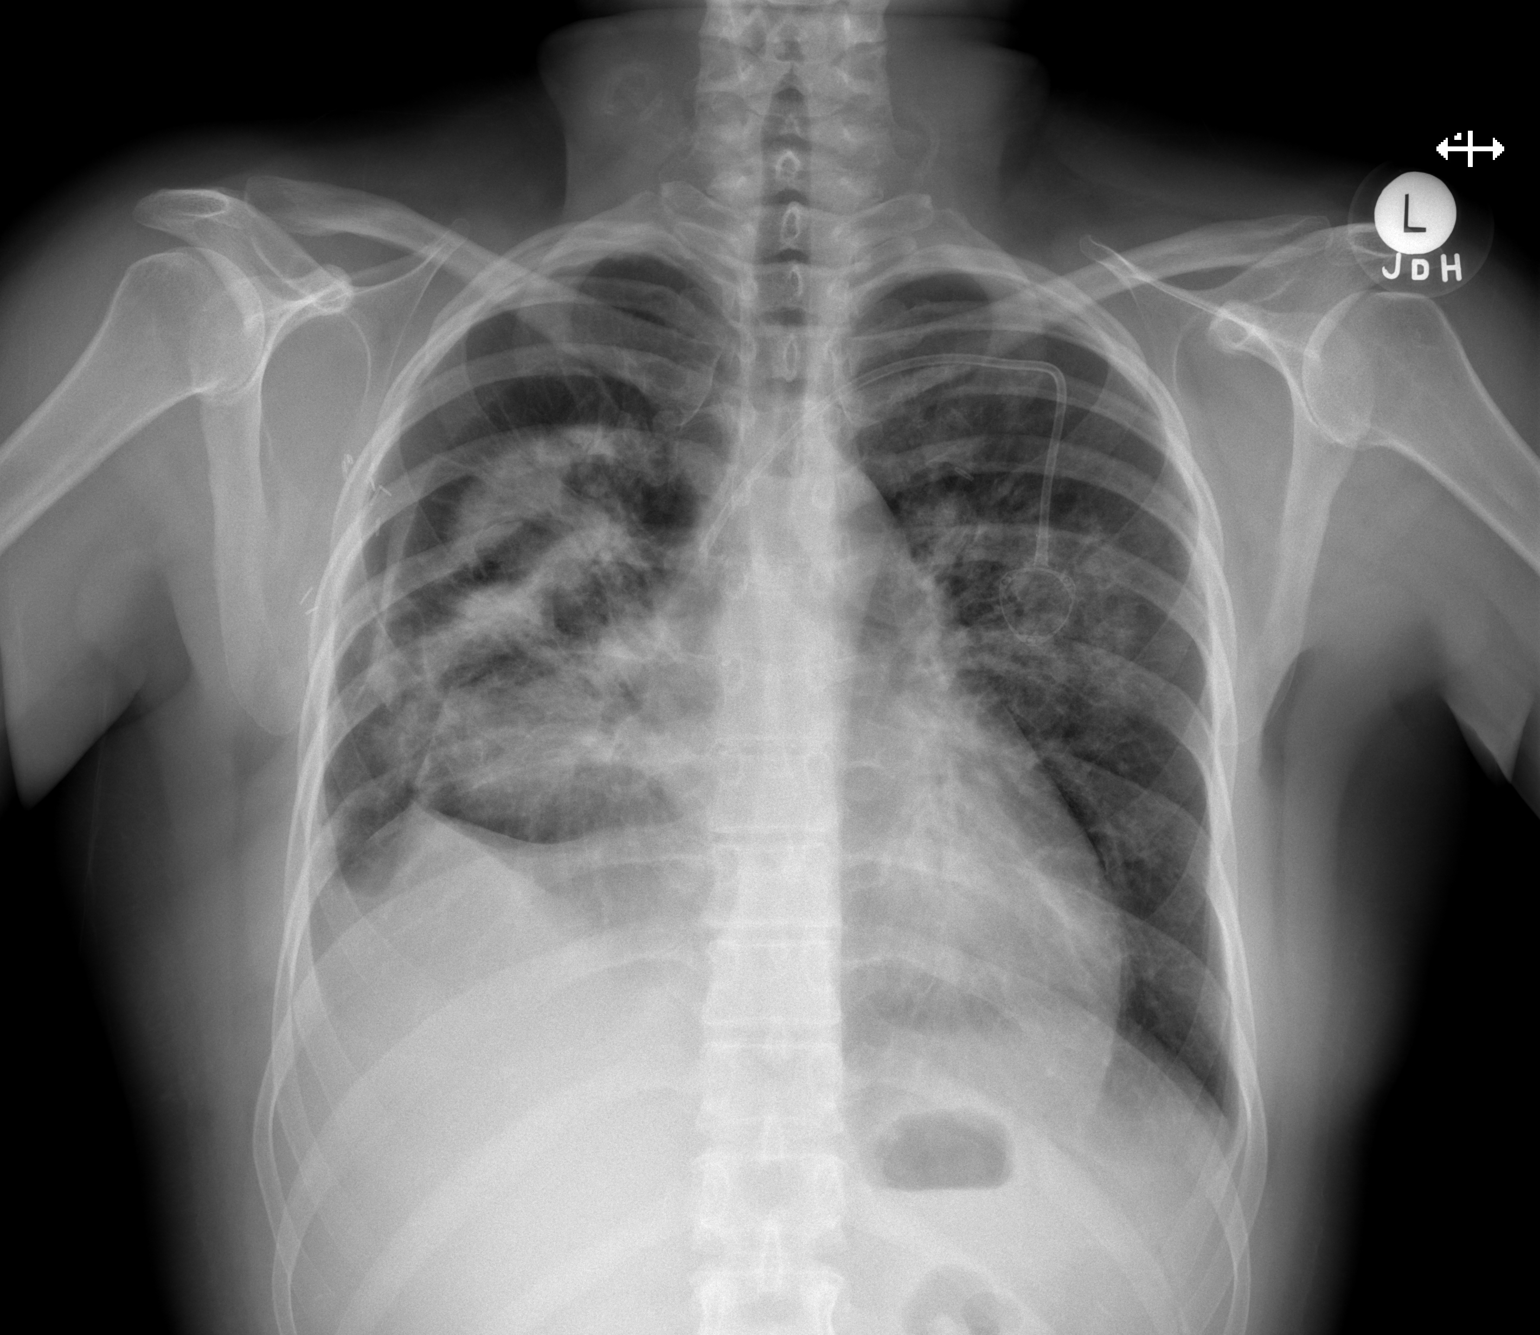

[w chest lat]
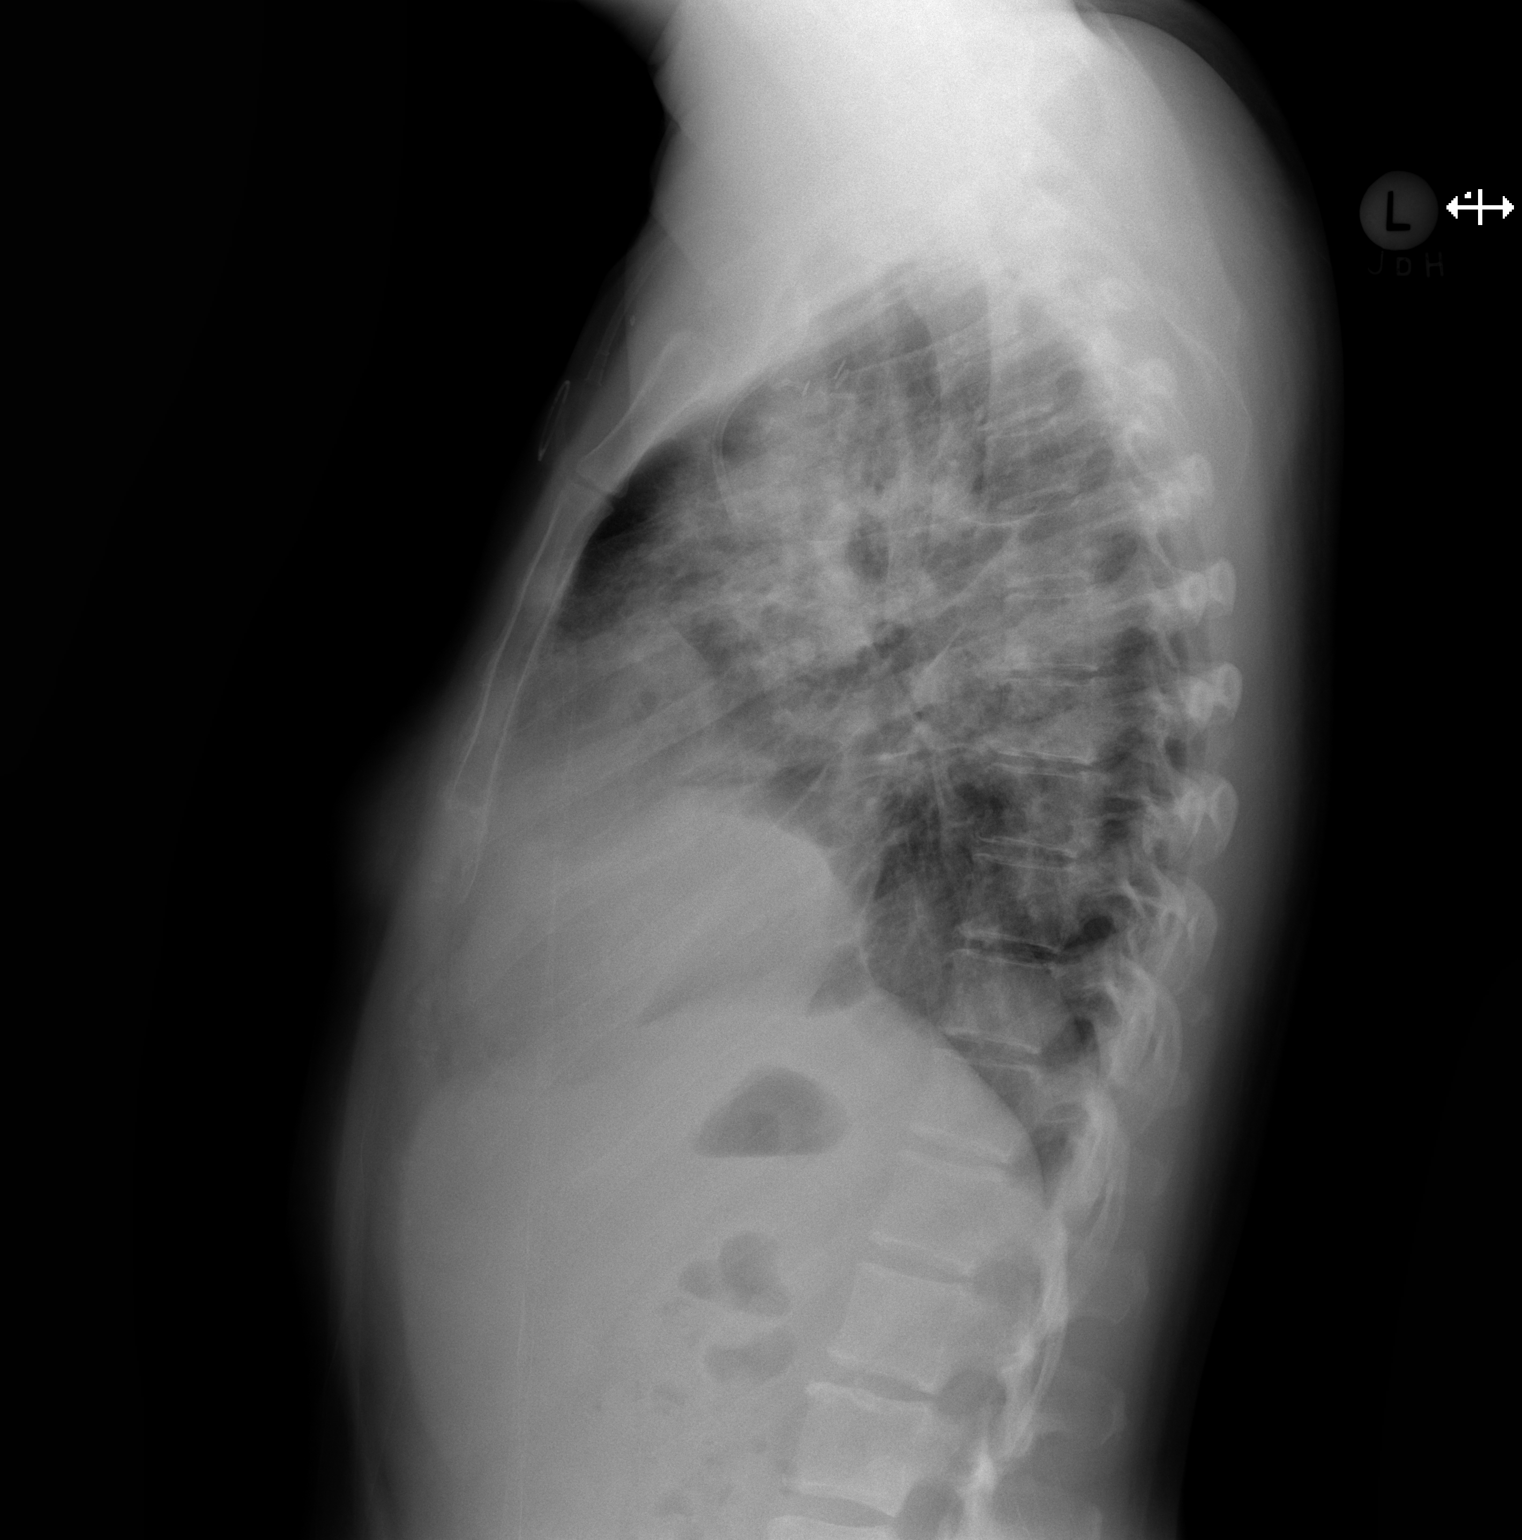

[2 of 2 positions shown; findings below may reference images not displayed]

FINDINGS: The heart size and mediastinal contours are stable. Left central
venous line is unchanged. Bilateral pneumonias are identified, right
worse than left. There is a right pleural effusion. There is
interval developed moderate right pneumothorax. The visualized
skeletal structures are stable.
IMPRESSION: Moderate right pneumothorax noted.

Bilateral pneumonias, right worse than left. Right pleural effusion.

These results were called by telephone at the time of interpretation
on 01/27/2018 at [DATE] to Werner Morones RN from Dr. [REDACTED], Who verbally acknowledged these results. Mr Roberto L Badillo stated she
will call [REDACTED] and give further direction
to the patient.

## 2019-06-30 IMAGING — DX DG CHEST 1V PORT
1 series · 1 of 1 positions shown · non-contrast
Comparison: 01/27/2018.

CLINICAL DATA: Followup right pneumothorax and chest tube.

EXAM:
PORTABLE CHEST 1 VIEW

[chest ap]
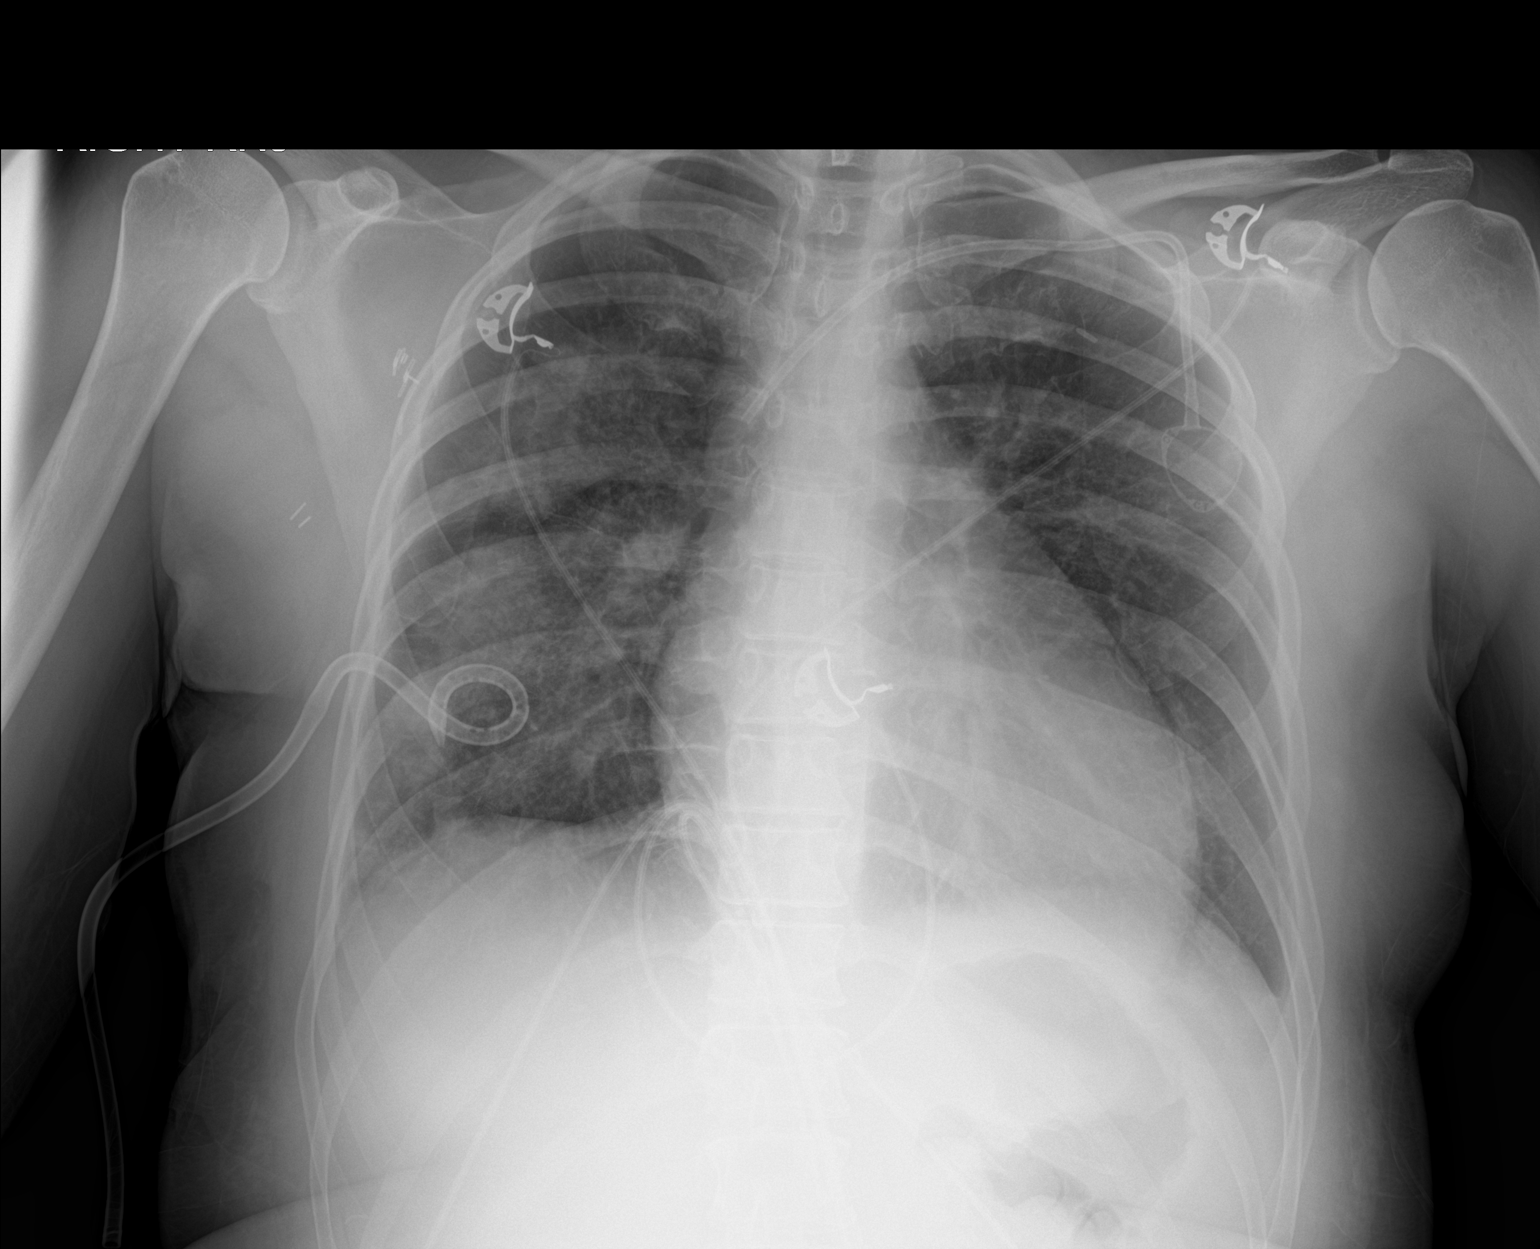

[1 of 1 positions shown; findings below may reference images not displayed]

FINDINGS: A small caliber pigtail chest tube remains on the right. The
previously seen tiny apical right pneumothorax is no longer
visualized. Mild patchy opacities throughout much of the right lung
with improvement. Interval clearing of the opacities in the left
lung. Stable enlarged cardiac silhouette. Small bilateral pleural
effusions. Left subclavian porta catheter tip in the proximal
superior vena cava. Right axillary surgical clips. Unremarkable
bones.
IMPRESSION: 1. Resolved right pneumothorax.
2. Improving right lung pneumonia or alveolar edema and resolved
left lung alveolar edema.
3. Small bilateral pleural effusions.
4. Stable cardiomegaly.

## 2019-06-30 IMAGING — DX DG CHEST 1V PORT
1 series · 1 of 1 positions shown · non-contrast
Comparison: Earlier same day

CLINICAL DATA: Worsening chest pain and shortness of breath.

EXAM:
PORTABLE CHEST 1 VIEW

[chest ap]
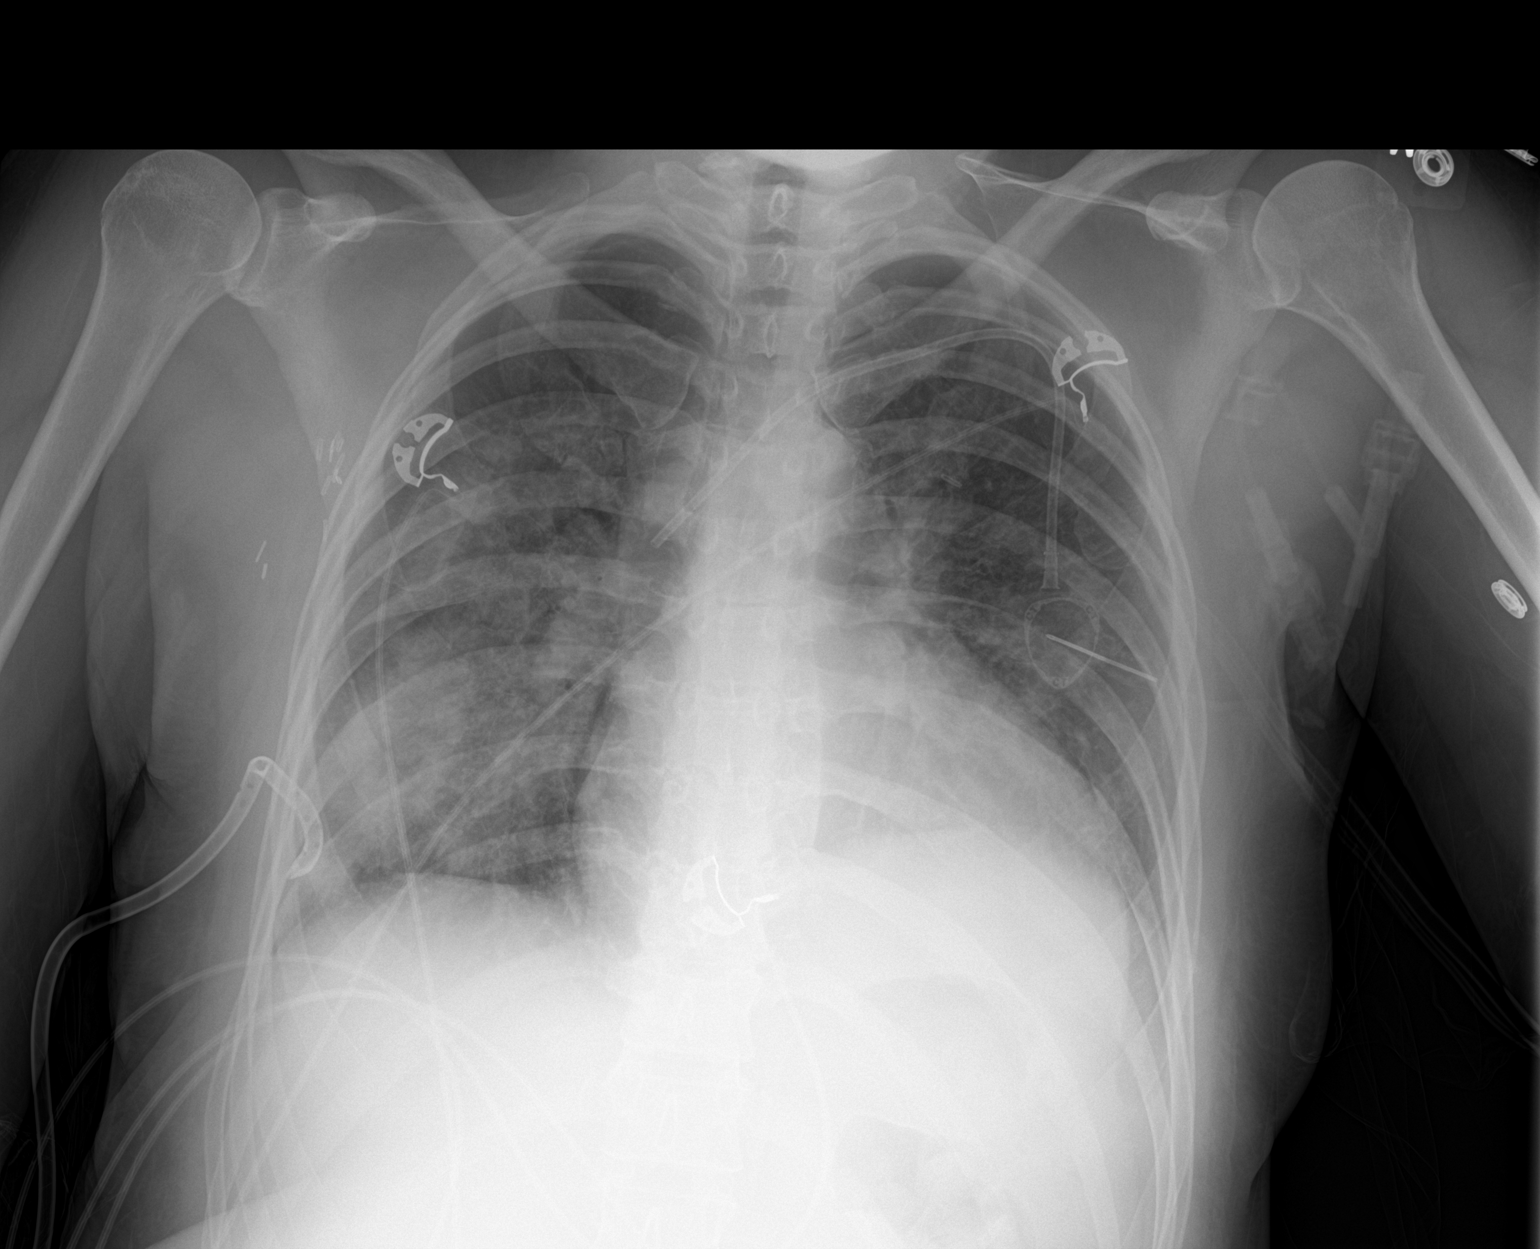

[1 of 1 positions shown; findings below may reference images not displayed]

FINDINGS: Right thoracostomy tube has been withdrawn such that at least part
of the pigtail is outside of the thoracic cavity. It is possible the
entire functional portion of the catheter could be outside of the
thoracic cavity. There is a small pneumothorax seen at the right
apex, well under 5%. Bilateral pulmonary opacity is worsening,
particularly on the right, which could be due to edema or pneumonia.
Port-A-Cath is unchanged.
IMPRESSION: Tiny amount of pleural air remains evident at the right apex. The
right chest tube has been pulled out of the pleural space either
partially or completely, I can't be certain based on this single
view. Pneumothorax is no larger however. Worsening airspace density,
particularly on the right, which could be due to edema or pneumonia.

## 2019-07-09 ENCOUNTER — Telehealth: Payer: Self-pay | Admitting: *Deleted

## 2019-07-09 ENCOUNTER — Other Ambulatory Visit: Payer: Self-pay | Admitting: *Deleted

## 2019-07-09 ENCOUNTER — Other Ambulatory Visit (HOSPITAL_COMMUNITY): Payer: Self-pay | Admitting: Internal Medicine

## 2019-07-09 DIAGNOSIS — C50911 Malignant neoplasm of unspecified site of right female breast: Secondary | ICD-10-CM

## 2019-07-09 NOTE — Telephone Encounter (Signed)
Upbeat Study; Called patient to confirm her appointments on Monday for MRI, Lab and Research nurse. Instructed patient to check in at main Radiology Department in [facility] for MRI.  She can then come over to the [place] afterwards to check in for Lab and Research appointments. Reminded patient to fast for at least 3 hours prior to lab.  Patient verbalized understanding.  Thanked patient and look forward to seeing her on Monday. Foye Spurling, BSN, RN Clinical Research Nurse 07/09/2019 12:28 PM

## 2019-07-12 ENCOUNTER — Inpatient Hospital Stay: Payer: 59 | Admitting: *Deleted

## 2019-07-12 ENCOUNTER — Ambulatory Visit (HOSPITAL_COMMUNITY)
Admission: RE | Admit: 2019-07-12 | Discharge: 2019-07-12 | Disposition: A | Discharge status: Home / Self Care | Payer: Self-pay | Source: Ambulatory Visit | Attending: Hematology and Oncology | Admitting: Hematology and Oncology

## 2019-07-12 ENCOUNTER — Other Ambulatory Visit: Payer: Self-pay

## 2019-07-12 ENCOUNTER — Inpatient Hospital Stay: Payer: 59 | Attending: Hematology and Oncology

## 2019-07-12 VITALS — BP 129/79 | HR 85 | Ht 62.5 in | Wt 186.8 lb

## 2019-07-12 DIAGNOSIS — Z006 Encounter for examination for normal comparison and control in clinical research program: Secondary | ICD-10-CM | POA: Insufficient documentation

## 2019-07-12 DIAGNOSIS — C50911 Malignant neoplasm of unspecified site of right female breast: Secondary | ICD-10-CM

## 2019-07-12 DIAGNOSIS — C773 Secondary and unspecified malignant neoplasm of axilla and upper limb lymph nodes: Secondary | ICD-10-CM | POA: Diagnosis not present

## 2019-07-12 LAB — CBC WITH DIFFERENTIAL (CANCER CENTER ONLY)
Abs Immature Granulocytes: 0.01 10*3/uL (ref 0.00–0.07)
Basophils Absolute: 0.1 10*3/uL (ref 0.0–0.1)
Basophils Relative: 1 %
Eosinophils Absolute: 0.2 10*3/uL (ref 0.0–0.5)
Eosinophils Relative: 4 %
HCT: 41.7 % (ref 36.0–46.0)
Hemoglobin: 14.2 g/dL (ref 12.0–15.0)
Immature Granulocytes: 0 %
Lymphocytes Relative: 29 %
Lymphs Abs: 1.4 10*3/uL (ref 0.7–4.0)
MCH: 30.2 pg (ref 26.0–34.0)
MCHC: 34.1 g/dL (ref 30.0–36.0)
MCV: 88.7 fL (ref 80.0–100.0)
Monocytes Absolute: 0.4 10*3/uL (ref 0.1–1.0)
Monocytes Relative: 9 %
Neutro Abs: 2.8 10*3/uL (ref 1.7–7.7)
Neutrophils Relative %: 57 %
Platelet Count: ADEQUATE 10*3/uL (ref 150–400)
RBC: 4.7 MIL/uL (ref 3.87–5.11)
RDW: 12.6 % (ref 11.5–15.5)
WBC Count: 4.9 10*3/uL (ref 4.0–10.5)
nRBC: 0 % (ref 0.0–0.2)

## 2019-07-12 LAB — CMP (CANCER CENTER ONLY)
ALT: 15 U/L (ref 0–44)
AST: 14 U/L — ABNORMAL LOW (ref 15–41)
Albumin: 3.1 g/dL — ABNORMAL LOW (ref 3.5–5.0)
Alkaline Phosphatase: 93 U/L (ref 38–126)
Anion gap: 9 (ref 5–15)
BUN: 15 mg/dL (ref 6–20)
CO2: 25 mmol/L (ref 22–32)
Calcium: 8.3 mg/dL — ABNORMAL LOW (ref 8.9–10.3)
Chloride: 107 mmol/L (ref 98–111)
Creatinine: 0.82 mg/dL (ref 0.44–1.00)
GFR, Est AFR Am: >60 mL/min (ref >60)
GFR, Estimated: >60 mL/min (ref >60)
Glucose, Bld: 166 mg/dL — ABNORMAL HIGH (ref 70–99)
Potassium: 4.1 mmol/L (ref 3.5–5.1)
Sodium: 141 mmol/L (ref 135–145)
Total Bilirubin: 0.4 mg/dL (ref 0.3–1.2)
Total Protein: 6.7 g/dL (ref 6.5–8.1)

## 2019-07-12 LAB — RESEARCH LABS

## 2019-07-12 NOTE — Research (Signed)
[id number] Upbeat 24 Months Visit:  Patient into clinic by herself this morning to complete activities for Upbeat study.  Cardiac MRI; Completed per protocol without difficulty.  PROs; Given to patient to complete in lobby prior to lab appointment.  Research nurse collected and checked for completeness and accuracy. Labs; Collected per protocol.  Patient confirmed she had been fasting for more than 3 hours prior to lab.  VS; Collected per protocol after resting for 5 minutes.  Height and weight without shoes. See VS flowsheet Waist measurement;  44 inches, measured per instructions on CRF14, page 1.  Physical Functions testing; Completed per protocol without difficulty and patient tolerated well. Neurocognitive testing; Completed by Farris Has, research assistant.  Medication review; Reviewed medication list with patient. Patient resumed taking lorazepam since last study visit. She also reports taking Claritin daily since starting chemotherapy but it was not on her medication list. Medication list updated in EMR. Cardiovascular Events form; Patient has not been hospitalized or had any CV event or been diagnosed with HF since last study visit.  Gift Card; [phone] [employer] gift card given to patient for completing study activities for this time point. Plan;  Thanked patient for her participation in this study. Informed patient this is the last study visit. Research staff will call patient once a year for the next 9 years for status update to complete the study. She verbalized understanding.  Foye Spurling, BSN, RN Clinical Research Nurse 07/12/2019

## 2019-07-29 ENCOUNTER — Other Ambulatory Visit: Payer: Self-pay | Admitting: Hematology and Oncology

## 2019-07-30 ENCOUNTER — Encounter (HOSPITAL_COMMUNITY): Payer: BC Managed Care – PPO | Admitting: Internal Medicine

## 2019-07-30 ENCOUNTER — Other Ambulatory Visit (HOSPITAL_COMMUNITY): Payer: BC Managed Care – PPO

## 2019-09-07 ENCOUNTER — Telehealth: Payer: Self-pay | Admitting: *Deleted

## 2019-09-07 ENCOUNTER — Telehealth (HOSPITAL_COMMUNITY): Payer: Self-pay | Admitting: Pharmacist

## 2019-09-07 NOTE — Telephone Encounter (Signed)
Upbeat Study; Patient states she received a bill for the Cardiac MRI done in April as part of the Upbeat Study. This MRI is supposed to be billed to the study.  Apologized to patient that she received this bill. Advised her to not pay the bill and this research nurse will follow up with research manager to make sure bill is sent to the study.  Informed patient I will call her back to let her know when it is resolved. She verbalized understanding.  Foye Spurling, BSN, RN Clinical Research Nurse 09/07/2019 3:45 PM

## 2019-09-07 NOTE — Telephone Encounter (Signed)
Advanced Heart Failure Patient Advocate Encounter  Prior Authorization for Stephanie Holden has been approved.    Effective dates: 09/07/19 through 09/06/20  Audry Riles, PharmD, BCPS, BCCP, CPP Heart Failure Clinic Pharmacist [phone]

## 2019-09-07 NOTE — Telephone Encounter (Signed)
Patient Advocate Encounter   Received notification from [employer] that prior authorization for Stephanie Holden is required.   PA submitted on CoverMyMeds Key [id number] Status is pending   Will continue to follow.  Audry Riles, PharmD, BCPS, BCCP, CPP Heart Failure Clinic Pharmacist [phone]

## 2019-09-08 ENCOUNTER — Telehealth: Payer: Self-pay | Admitting: *Deleted

## 2019-09-08 NOTE — Telephone Encounter (Signed)
Upbeat Study; LVM for patient to inform her the billing for cardiac MRI has been corrected. Asked her to call if she receives any other bills and apologized that she received this one in error.  Foye Spurling, BSN, RN Clinical Research Nurse 09/08/2019 3:57 PM

## 2019-09-22 ENCOUNTER — Telehealth: Payer: Self-pay | Admitting: *Deleted

## 2019-09-22 NOTE — Telephone Encounter (Signed)
Thank you. Can we get the exact EF and also the presence of any LGE, please?

## 2019-09-22 NOTE — Telephone Encounter (Signed)
Upbeat Study Cardiac MRI Results;  Cardiac MRI results were received from study today from MRI completed in 07/12/19.  The report states "potentially significant findings" in the LV Wall Motion.  A comment was added to the LV Wall Motion section stating "Mild global hypokinesis."  This MRI is for research purposes only and only evaluates part of the heart. Dr. Lindi Adie reviewed report and instructed for patient to follow up with Cardiologist.  Patient is scheduled for Echocardiogram and appt with Dr. Haroldine Laws next week on 09/30/19. Called patient to inform her of this report and there is no answer and unable to leave message. Research nurse will mail report to patient attempt to call again tomorrow.  This note will also be routed to Cardiologist.  Foye Spurling, BSN, RN Clinical Research Nurse 09/22/2019 2:24 PM

## 2019-09-23 ENCOUNTER — Telehealth: Payer: Self-pay | Admitting: *Deleted

## 2019-09-23 NOTE — Telephone Encounter (Signed)
Unfortunately the study MRI report does not provide any specific information such as EF or presence of LGE. It just lets [place] know if there were any abnormalities or not. I will get a CD of the MRI burned for patient to bring on her visit with you next week.  Foye Spurling, BSN, RN Clinical Research Nurse 09/23/2019 10:23 AM

## 2019-09-23 NOTE — Telephone Encounter (Signed)
Upbeat Study;  Informed patient of cardiac MRI results showing abnormality in LV Wall Motion. Informed her copies of report were mailed to her home address and a CD of the MRI will be burned for her to take with her to see Dr. Haroldine Laws next week.  Patient will stop by [facility] next Thursday around 9:30 am to pick up the CD from research nurse before her Cardiology appointments.   Foye Spurling, BSN, RN Clinical Research Nurse 09/23/2019 10:40 AM

## 2019-09-24 NOTE — Telephone Encounter (Signed)
Thank you.It would be nice if there would be a way for [place] to get the information from the core lab directly. It is hard to load the studies and I do not read MRIs so the process is quite difficult.   It is suboptimal patient care to have an abnormal MRI result and [place] not to be able to get than information

## 2019-09-24 NOTE — Telephone Encounter (Signed)
Hey. What can I do to help?  Gudena and Magrinat are off and I am holding down the fort.  I am happy to do whatever is needed!

## 2019-09-30 ENCOUNTER — Ambulatory Visit (HOSPITAL_BASED_OUTPATIENT_CLINIC_OR_DEPARTMENT_OTHER)
Admission: RE | Admit: 2019-09-30 | Discharge: 2019-09-30 | Disposition: A | Discharge status: Home / Self Care | Payer: 59 | Source: Ambulatory Visit | Attending: Internal Medicine | Admitting: Internal Medicine

## 2019-09-30 ENCOUNTER — Other Ambulatory Visit: Payer: Self-pay

## 2019-09-30 ENCOUNTER — Encounter (HOSPITAL_COMMUNITY): Payer: Self-pay | Admitting: Internal Medicine

## 2019-09-30 ENCOUNTER — Ambulatory Visit (HOSPITAL_COMMUNITY)
Admission: RE | Admit: 2019-09-30 | Discharge: 2019-09-30 | Disposition: A | Discharge status: Home / Self Care | Payer: 59 | Source: Ambulatory Visit | Attending: Internal Medicine | Admitting: Internal Medicine

## 2019-09-30 VITALS — BP 122/88 | HR 74 | Wt 190.0 lb

## 2019-09-30 DIAGNOSIS — Z9013 Acquired absence of bilateral breasts and nipples: Secondary | ICD-10-CM | POA: Diagnosis not present

## 2019-09-30 DIAGNOSIS — Z87891 Personal history of nicotine dependence: Secondary | ICD-10-CM | POA: Insufficient documentation

## 2019-09-30 DIAGNOSIS — Z79899 Other long term (current) drug therapy: Secondary | ICD-10-CM | POA: Insufficient documentation

## 2019-09-30 DIAGNOSIS — Z9641 Presence of insulin pump (external) (internal): Secondary | ICD-10-CM | POA: Insufficient documentation

## 2019-09-30 DIAGNOSIS — M7989 Other specified soft tissue disorders: Secondary | ICD-10-CM | POA: Diagnosis not present

## 2019-09-30 DIAGNOSIS — E109 Type 1 diabetes mellitus without complications: Secondary | ICD-10-CM

## 2019-09-30 DIAGNOSIS — Z170 Estrogen receptor positive status [ER+]: Secondary | ICD-10-CM | POA: Diagnosis not present

## 2019-09-30 DIAGNOSIS — I427 Cardiomyopathy due to drug and external agent: Secondary | ICD-10-CM | POA: Diagnosis not present

## 2019-09-30 DIAGNOSIS — Z923 Personal history of irradiation: Secondary | ICD-10-CM | POA: Diagnosis not present

## 2019-09-30 DIAGNOSIS — J90 Pleural effusion, not elsewhere classified: Secondary | ICD-10-CM | POA: Insufficient documentation

## 2019-09-30 DIAGNOSIS — T451X5A Adverse effect of antineoplastic and immunosuppressive drugs, initial encounter: Secondary | ICD-10-CM

## 2019-09-30 DIAGNOSIS — Z853 Personal history of malignant neoplasm of breast: Secondary | ICD-10-CM | POA: Insufficient documentation

## 2019-09-30 DIAGNOSIS — I5022 Chronic systolic (congestive) heart failure: Secondary | ICD-10-CM | POA: Diagnosis not present

## 2019-09-30 DIAGNOSIS — E785 Hyperlipidemia, unspecified: Secondary | ICD-10-CM | POA: Insufficient documentation

## 2019-09-30 DIAGNOSIS — C50611 Malignant neoplasm of axillary tail of right female breast: Secondary | ICD-10-CM

## 2019-09-30 DIAGNOSIS — D6481 Anemia due to antineoplastic chemotherapy: Secondary | ICD-10-CM | POA: Diagnosis not present

## 2019-09-30 DIAGNOSIS — Z4682 Encounter for fitting and adjustment of non-vascular catheter: Secondary | ICD-10-CM | POA: Insufficient documentation

## 2019-09-30 DIAGNOSIS — Z794 Long term (current) use of insulin: Secondary | ICD-10-CM | POA: Diagnosis not present

## 2019-09-30 DIAGNOSIS — R0683 Snoring: Secondary | ICD-10-CM

## 2019-09-30 LAB — COMPREHENSIVE METABOLIC PANEL
ALT: 16 U/L (ref 0–44)
AST: 19 U/L (ref 15–41)
Albumin: 3.3 g/dL — ABNORMAL LOW (ref 3.5–5.0)
Alkaline Phosphatase: 72 U/L (ref 38–126)
Anion gap: 7 (ref 5–15)
BUN: 13 mg/dL (ref 6–20)
CO2: 27 mmol/L (ref 22–32)
Calcium: 9 mg/dL (ref 8.9–10.3)
Chloride: 102 mmol/L (ref 98–111)
Creatinine, Ser: 0.78 mg/dL (ref 0.44–1.00)
GFR calc Af Amer: >60 mL/min (ref >60)
GFR calc non Af Amer: >60 mL/min (ref >60)
Glucose, Bld: 216 mg/dL — ABNORMAL HIGH (ref 70–99)
Potassium: 4.9 mmol/L (ref 3.5–5.1)
Sodium: 136 mmol/L (ref 135–145)
Total Bilirubin: 1.3 mg/dL — ABNORMAL HIGH (ref 0.3–1.2)
Total Protein: 6.7 g/dL (ref 6.5–8.1)

## 2019-09-30 LAB — CBC
HCT: 44.2 % (ref 36.0–46.0)
Hemoglobin: 14.3 g/dL (ref 12.0–15.0)
MCH: 29.8 pg (ref 26.0–34.0)
MCHC: 32.4 g/dL (ref 30.0–36.0)
MCV: 92.1 fL (ref 80.0–100.0)
Platelets: 258 10*3/uL (ref 150–400)
RBC: 4.8 MIL/uL (ref 3.87–5.11)
RDW: 12.9 % (ref 11.5–15.5)
WBC: 5.4 10*3/uL (ref 4.0–10.5)
nRBC: 0 % (ref 0.0–0.2)

## 2019-09-30 LAB — BRAIN NATRIURETIC PEPTIDE: B Natriuretic Peptide: 38.1 pg/mL (ref 0.0–100.0)

## 2019-09-30 LAB — HEMOGLOBIN A1C
Hgb A1c MFr Bld: 8.9 % — ABNORMAL HIGH (ref 4.8–5.6)
Mean Plasma Glucose: 208.73 mg/dL

## 2019-09-30 MED ORDER — ENTRESTO 97-103 MG PO TABS: 1.0000 | ORAL_TABLET | Freq: Two times a day (BID) | ORAL | 3 refills | Status: DC

## 2019-09-30 NOTE — Addendum Note (Signed)
Encounter addended by: Shonna Chock, CMA on: 04/29/9100 12:22 PM  Actions taken: Clinical Note Signed, Charge Capture section accepted, Follow-up modified, Visit diagnoses modified, Order list changed, Diagnosis association updated

## 2019-09-30 NOTE — Patient Instructions (Signed)
Labs done today. We will contact you only if your labs are abnormal.  INCREASE Entresto 97-103mg (1 tablet) by mouth twice daily.  No other medication changes were made. Please continue all current medications as prescribed.  Your physician recommends that you schedule a follow-up appointment in: 6 months. Please contact our office in December for a January 2022 appointment.  If you have any questions or concerns before your next appointment please send [place] a message through [place] or call our office at [phone].    TO LEAVE A MESSAGE FOR THE NURSE SELECT OPTION 2, PLEASE LEAVE A MESSAGE INCLUDING: . YOUR NAME . DATE OF BIRTH . CALL BACK NUMBER . REASON FOR CALL**this is important as we prioritize the call backs  [place] AS LONG AS YOU CALL BEFORE 4:00 PM  Your provider has recommended that you have a home sleep study.  [employer] is the company that does these test.  They will contact you by phone and must speak with you before they can ship the equipment.  Once they have spoken with you they will send the equipment right to your home with instructions on how to set it up.  Once you have completed the test you just dispose of the equipment, the information is automatically uploaded to [place] via [employer] technology.  IF you have any questions or issues with the equipment please call the company directly at [phone].  If your test is positive for sleep apnea and you need a home CPAP machine you will be contacted by Dr Theodosia Blender office [facility]) to set this up.   At the [facility], you and your health needs are our priority. As part of our continuing mission to provide you with exceptional heart care, we have created designated Provider Care Teams. These Care Teams include your primary Cardiologist (physician) and Advanced Practice Providers (APPs- Physician Assistants and Nurse Practitioners) who all work together to provide you  with the care you need, when you need it.   You may see any of the following providers on your designated Care Team at your next follow up: Marland Kitchen Dr Glori Bickers . Dr Loralie Champagne . Darrick Grinder, NP . Lyda Jester, PA . Audry Riles, PharmD   Please be sure to bring in all your medications bottles to every appointment.

## 2019-09-30 NOTE — Progress Notes (Signed)
Patient Name: Stephanie Holden        DOB: 08/06/1975      Height: [facility]         Referring Provider: Glori Bickers  Today's Date: 09/30/2019  Date:  09/30/2019 STOP BANG RISK ASSESSMENT S (snore) Have you been told that you snore?     YES   T (tired) Are you often tired, fatigued, or sleepy during the day?   YES  O (obstruction) Do you stop breathing, choke, or gasp during sleep? NO   P (pressure) Do you have or are you being treated for high blood pressure? YES   B (BMI) Is your body index greater than 35 kg/m? NO   A (age) Are you 68 years old or older? NO   N (neck) Do you have a neck circumference greater than 16 inches?   YES/NO   G (gender) Are you a female? NO   TOTAL STOP/BANG "YES" ANSWERS 3                                                                       For Office Use Only              Procedure Order Form    YES to 3+ Stop Bang questions OR two clinical symptoms - patient qualifies for [employer] (CPT 95800)     Submit: This Form + Patient Face Sheet + Clinical Note via [employer] or Fax: [phone]         Clinical Notes: Will consult Sleep Specialist and refer for management of therapy due to patient increased risk of Sleep Apnea. Ordering a sleep study due to the following two clinical symptoms: Excessive daytime sleepiness G47.10 / Gastroesophageal reflux K21.9 / Nocturia R35.1 / Morning Headaches G44.221 / Difficulty concentrating R41.840 / Memory problems or poor judgment G31.84 / Personality changes or irritability R45.4 / Loud snoring R06.83 / Depression F32.9 / Unrefreshed by sleep G47.8 / Impotence N52.9 / History of high blood pressure R03.0 / Insomnia G47.00    I understand that I am proceeding with a home sleep apnea test as ordered by my treating physician. I understand that untreated sleep apnea is a serious cardiovascular risk factor and it is my responsibility to perform the test and seek  management for sleep apnea. I will be contacted with the results and be managed for sleep apnea by a local sleep physician. I will be receiving equipment and further instructions from [facility]. I shall promptly ship back the equipment via the included mailing label. I understand my insurance will be billed for the test and as the patient I am responsible for any insurance related out-of-pocket costs incurred. I have been provided with written instructions and can call for additional video or telephonic instruction, with 24-hour availability of qualified personnel to answer any questions: Patient Help Desk [phone].  Patient Signature ______________________________________________________   Date______________________ Patient Telemedicine Verbal Consent

## 2019-09-30 NOTE — Progress Notes (Signed)
Advanced Heart Failure Clinic Note   Referring Physician: Dr Lindi Adie PCP: Tisovec, Fransico Him, MD PCP-Cardiologist: Glori Bickers, MD   HPI: Stephanie Holden is a 44 y.o. female with a history of type 1 DM on insulin pump, thyroid goiter, and breast cancer.     Malignant neoplasm of axillary tail of right breast in female, estrogen receptor positive ([place])   05/09/2017 Initial Diagnosis    May 2018: Palpable right axillary mass.  The recommended a 6-week follow-up but apparently the mass decreased in size.  Recent increase in the mass was noted, biopsy revealed Grade 1 IDC with DCIS, lymphovascular invasion present, 4 small hypoechoic masses largest 0.9 cm, T1BN1 stage Ib clinical stage      06/05/2017 Surgery    Bilateral mastectomies: Right mastectomy: IDC grade 1, 3 foci 1.2 cm, 1.2 cm, 1 cm, IgA DCIS, LVH diffuse present, margins negative, 4/7 lymph nodes positive with extracapsular extension, ER 95%, PR 95%, HER-2 negative, Ki-67 2% to 10%, T1 cN2 aM0 stage II a pathological stage; left mastectomy: Benign      06/26/2017 Surgery    Axillary lymph node dissection: 2/22 lymph nodes positive (making a total of 6+ lymph nodes)      07/22/2017 -  Chemotherapy    Adjuvant chemotherapy with dose dense Adriamycin and Cytoxan x4 followed by Taxol weekly x12   We saw her in 6/19 for the first time in the cardio-oncology clinic in 6/19. She had recently completed chemo and echo showed EF 60-65%. Admitted in 10/19 with PNA but then found to have systolic HF with EF 76-54%. Course c/b large R pleural effusion. Initially treated with thoracentesis on 01/13/18 but then recurred so chest tube was placed. Effusion found to be transudative with negative cytology.   Today she returns for HF follow up. She feels good. Is now exercising 7 days per week. Since starting exercise had more swelling in left leg. Took some lasix and got better the next day. No orthopnea or PND. BP typically  105-110/70s  Echo today 7/82/21 EF 50-55% GLS -17.1%  CMRI 07/12/19 as part of Upbeat study EF 47%   CT cors 12/20: Calcium score 0. No CAD  cMRI 12/19 1.  Normal LV size with moderate diffuse hypokinesis, EF 37%. 2.  Normal RV size with mildly decreased systolic function, EF 65%. 3. No myocardial LGE, so no definitive evidence for prior myocardial infarction, infiltrative disease, or myocarditis.  ECHO 09/10/2018 EF 40-45% GLS 17.6.  Echo 01/28/18 EF 25-30% Grade II DD. Mild RV dysfunction Echo 6/19 EF 60-65% LS 11.90 GLS 18.7%  Review of systems complete and found to be negative unless listed in HPI.   Past Medical History:  Diagnosis Date   Cancer [facility])    Breast cancer - right   CHF (congestive heart failure) ([place])    Constipation    Depression    Genetic testing 05/27/2017   Breast/GYN panel (23 genes) @ [facility] - No pathogenic mutations detected   GERD (gastroesophageal reflux disease)    History of radiation therapy 12/30/17- 02/10/18   Right chest wall/ 50 Gy in 25 fractions, Right SCV, PAB nodes/ 50 Gy in 25 fractions, Right Chest wall boost 10 gy in 5 fractions.    Insulin dependent type 1 diabetes mellitus (HCC)    Insulin pump in place    PONV (postoperative nausea and vomiting)    Stenosing tenosynovitis of thumb 06/2014   right   Thyroid goiter     Current Outpatient Medications  Medication Sig Dispense Refill   carvedilol (COREG) 6.25 MG tablet TAKE 1 TABLET (6.25 MG TOTAL) BY MOUTH 2 (TWO) TIMES DAILY WITH A MEAL. 180 tablet 2   CONTOUR TEST test strip USE TO TEST BLOOD SUGARS 6 TIMES DAILY ON PUMP DX  E10.9     ENTRESTO 49-51 MG TAKE 1 TABLET BY MOUTH TWICE A DAY 180 tablet 3   furosemide (LASIX) 40 MG tablet Take 40 mg by mouth as needed for fluid or edema.      insulin aspart (NOVOLOG) 100 UNIT/ML injection Inject 45 Units into the skin See admin instructions. Use via insulin pump up to 45 units daily     loratadine (CLARITIN) 10 MG  tablet Take 10 mg by mouth daily.     LORazepam (ATIVAN) 0.5 MG tablet Take 1 tablet (0.5 mg total) by mouth at bedtime as needed for anxiety. 30 tablet 3   potassium chloride SA (K-DUR,KLOR-CON) 20 MEQ tablet Take 10 mEq by mouth daily as needed (with furosemide).      pravastatin (PRAVACHOL) 20 MG tablet Take 20 mg by mouth daily. Pt not sure of dose, but thinks it is 20.     Probiotic Product (PROBIOTIC PO) Take by mouth.     spironolactone (ALDACTONE) 25 MG tablet TAKE 1/2 TABLET BY MOUTH EVERY DAY 45 tablet 3   tamoxifen (NOLVADEX) 20 MG tablet TAKE 1 TABLET BY MOUTH EVERY DAY 90 tablet 3   venlafaxine XR (EFFEXOR-XR) 75 MG 24 hr capsule TAKE 1 CAPSULE (75 MG TOTAL) BY MOUTH DAILY WITH BREAKFAST. 90 capsule 0   No current facility-administered medications for this encounter.    Allergies  Allergen Reactions   Dilaudid [Hydromorphone Hcl] Itching and Other (See Comments)    UNCONTROLLABLE CRYING      Social History   Socioeconomic History   Marital status: Married    Spouse name: Not on file   Number of children: Not on file   Years of education: Not on file   Highest education level: Not on file  Occupational History   Not on file  Tobacco Use   Smoking status: Former Smoker    Quit date: 03/24/2001    Years since quitting: 18.5   Smokeless tobacco: Never Used  Scientific laboratory technician Use: Never used  Substance and Sexual Activity   Alcohol use: Not Currently   Drug use: No   Sexual activity: Yes    Partners: Male    Birth control/protection: Surgical    Comment: tubal ligation & 1 tube removed  Other Topics Concern   Not on file  Social History Narrative   Not on file   Social Determinants of Health   Financial Resource Strain:    Difficulty of Paying Living Expenses:   Food Insecurity:    Worried About Charity fundraiser in the Last Year:    Arboriculturist in the Last Year:   Transportation Needs:    Film/video editor  (Medical):    Lack of Transportation (Non-Medical):   Physical Activity:    Days of Exercise per Week:    Minutes of Exercise per Session:   Stress:    Feeling of Stress :   Social Connections:    Frequency of Communication with Friends and Family:    Frequency of Social Gatherings with Friends and Family:    Attends Religious Services:    Active Member of Clubs or Organizations:    Attends Archivist Meetings:  Marital Status:   Intimate Partner Violence:    Fear of Current or Ex-Partner:    Emotionally Abused:    Physically Abused:    Sexually Abused:       Family History  Problem Relation Age of Onset   Hypertension Mother    Hypertension Father    Breast cancer Other 48       paternal grandfather's niece    Vitals:   09/30/19 1114  BP: 122/88  Pulse: 74  SpO2: 97%  Weight: 86.2 kg (190 lb)   Wt Readings from Last 3 Encounters:  09/30/19 86.2 kg (190 lb)  07/12/19 84.7 kg (186 lb 12.8 oz)  03/15/19 81.6 kg (180 lb)    PHYSICAL EXAM: General:  Well appearing. No resp difficulty HEENT: normal Neck: supple. no JVD. Carotids 2+ bilat; no bruits. No lymphadenopathy or thryomegaly appreciated. Cor: PMI nondisplaced. Regular rate & rhythm. No rubs, gallops or murmurs. Lungs: clear Abdomen: soft, nontender, nondistended. No hepatosplenomegaly. No bruits or masses. Good bowel sounds. Extremities: no cyanosis, clubbing, rash, edema Neuro: alert & orientedx3, cranial nerves grossly intact. moves all 4 extremities w/o difficulty. Affect pleasant  ASSESSMENT & PLAN:  1. 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 No CAD - NYHA I. Volume status looks good. Continue prn lasix - echo 6/19 EF 60% - echo 11/19 EF 25-30% - cMRI 12/19 EF 37%. No LGE. Out  of window for ICD. - ECHO 12/21 EF 55% GLS - 17.1% - cMRI 4/21 as part of Upbeat study EF 47% - Echo today 09/30/19 EF 50-55% GLS -17.5% -> EF improved and stabilized. Continue to titrate GDMT - Increase Entresto to 97/103 bid. - Continue spiro 12.5 mg daily. - Continue coreg to 6.25 mg BID.  - No Farxiga due to Type I DM - Labs today  2. Malignant neoplasm of axillary tail of right breast in female, estrogen receptor positive  - Diagnosed with breast cancer 05/09/17. Underwent bilateral mastectomies on 06/05/17. Underwent axillary lymph node dissection on 06/26/17. Started chemo 07/22/17 with adriamycin and cytoxan x4, then taxol weekly x12. Finished XRT on 02/09/18 - echos as above   3. Hyperlipidemia - Her endocrinologist started her on pravastatin recently.   4. Type I, DM - Follows with Dr. Osborne Casco - check HgBa1c  5. Snoring and daytime sleepiness - order sleep study   Glori Bickers, MD  11:36 AM

## 2019-09-30 NOTE — Addendum Note (Signed)
Encounter addended by: Shonna Chock, CMA on: 11/28/7225 2:07 PM  Actions taken: Clinical Note Signed

## 2019-09-30 NOTE — Progress Notes (Signed)
  Echocardiogram 2D Echocardiogram has been performed.  Johny Chess 09/30/2019, 10:51 AM

## 2019-10-04 ENCOUNTER — Telehealth (HOSPITAL_COMMUNITY): Payer: Self-pay

## 2019-10-04 NOTE — Telephone Encounter (Signed)
Order, OV note, stop bang and demographics all faxed to [facility] at [phone]  

## 2019-10-20 ENCOUNTER — Other Ambulatory Visit: Payer: Self-pay | Admitting: Hematology and Oncology

## 2019-12-10 ENCOUNTER — Telehealth: Payer: Self-pay

## 2019-12-10 DIAGNOSIS — C50611 Malignant neoplasm of axillary tail of right female breast: Secondary | ICD-10-CM

## 2019-12-10 DIAGNOSIS — Z170 Estrogen receptor positive status [ER+]: Secondary | ICD-10-CM

## 2019-12-10 NOTE — Telephone Encounter (Signed)
UPBEAT [id number] - UNDERSTANDING and PREDICTING BREAST CANCER EVENTS AFTER TREATMENT  12/10/2019 1310PM  OUTGOING CALL: Outgoing call to [place] introducing myself as a Marine scientist for [employer], seeking to inform her of a new 90-month questionnaire recently added to the UPBEAT study and the option to send questionnaires via email should she desire to do so. Requested direct call back to my number. Current plan is to await call back and attempt to reach [place] again next week.  Dionne Bucy. Sharlett Iles, BSN, RN, CIC 12/10/2019 1:13 PM

## 2019-12-21 NOTE — Telephone Encounter (Signed)
UPBEAT [id number] - UNDERSTANDING and PREDICTING BREAST CANCER EVENTS AFTER TREATMENT  12/21/2019 12:42PM  OUTGOING CALL: Second outgoing call to inform [facility] of a new 4-month UPBEAT questionnaire: Simora's 60-month visit was completed 07/12/2019. Zaiyah answers this call and I confirm that I am speaking with the correct person. Caryl Pina and I spoke for approximately five minutes. The consent addendum script for amendment six is cited and Camari agrees to receive the new questionnaire via email using the email address currently on file with [facility] and consents to questionnaire completion. Takenya is thanked for her time and continued participation in the UPBEAT study. She is encouraged to contact myself or Cameo Windham with any needs and she verbalizes understanding. Vernon's consent and email address are entered into the [employer] system for electronic questionnaire distribution. Clinical research nurse Foye Spurling is notified of addemdum consent and will follow to ensure the questionnaire is completed.  Dionne Bucy. Sharlett Iles, BSN, RN, CIC 12/21/2019 1:09 PM

## 2019-12-24 ENCOUNTER — Telehealth: Payer: Self-pay

## 2019-12-24 NOTE — Telephone Encounter (Signed)
Error Dionne Bucy. Sharlett Iles, BSN, RN, CIC 12/24/2019 8:27 AM

## 2020-02-28 ENCOUNTER — Other Ambulatory Visit: Payer: Self-pay | Admitting: Hematology and Oncology

## 2020-02-28 ENCOUNTER — Other Ambulatory Visit (HOSPITAL_COMMUNITY): Payer: Self-pay | Admitting: Internal Medicine

## 2020-03-01 ENCOUNTER — Telehealth (HOSPITAL_COMMUNITY): Payer: Self-pay | Admitting: Surgery

## 2020-03-01 NOTE — Telephone Encounter (Signed)
I contacted patient in reference to the home Sleep Study ordered previously.  She informs that she did not receive equipment.  I have informed her that our process has changed and that she would be contacted regarding her AHF Clinic follow-up appt in January and at that follow up appt--home sleep study would be addressed.

## 2020-04-12 ENCOUNTER — Other Ambulatory Visit: Payer: Self-pay | Admitting: Hematology and Oncology

## 2020-04-28 ENCOUNTER — Other Ambulatory Visit: Payer: Self-pay

## 2020-04-28 ENCOUNTER — Encounter (HOSPITAL_COMMUNITY): Payer: Self-pay | Admitting: Internal Medicine

## 2020-04-28 ENCOUNTER — Ambulatory Visit (HOSPITAL_BASED_OUTPATIENT_CLINIC_OR_DEPARTMENT_OTHER)
Admission: RE | Admit: 2020-04-28 | Discharge: 2020-04-28 | Disposition: A | Discharge status: Home / Self Care | Payer: 59 | Source: Ambulatory Visit | Attending: Internal Medicine | Admitting: Internal Medicine

## 2020-04-28 ENCOUNTER — Ambulatory Visit (HOSPITAL_COMMUNITY)
Admission: RE | Admit: 2020-04-28 | Discharge: 2020-04-28 | Disposition: A | Discharge status: Home / Self Care | Payer: 59 | Source: Ambulatory Visit | Attending: Internal Medicine | Admitting: Internal Medicine

## 2020-04-28 VITALS — BP 140/80 | HR 64 | Wt 193.0 lb

## 2020-04-28 DIAGNOSIS — Z87891 Personal history of nicotine dependence: Secondary | ICD-10-CM | POA: Diagnosis not present

## 2020-04-28 DIAGNOSIS — Z794 Long term (current) use of insulin: Secondary | ICD-10-CM | POA: Insufficient documentation

## 2020-04-28 DIAGNOSIS — R0683 Snoring: Secondary | ICD-10-CM | POA: Diagnosis not present

## 2020-04-28 DIAGNOSIS — Z9013 Acquired absence of bilateral breasts and nipples: Secondary | ICD-10-CM | POA: Insufficient documentation

## 2020-04-28 DIAGNOSIS — E109 Type 1 diabetes mellitus without complications: Secondary | ICD-10-CM | POA: Diagnosis not present

## 2020-04-28 DIAGNOSIS — K219 Gastro-esophageal reflux disease without esophagitis: Secondary | ICD-10-CM | POA: Insufficient documentation

## 2020-04-28 DIAGNOSIS — Z79899 Other long term (current) drug therapy: Secondary | ICD-10-CM | POA: Insufficient documentation

## 2020-04-28 DIAGNOSIS — Z170 Estrogen receptor positive status [ER+]: Secondary | ICD-10-CM | POA: Diagnosis not present

## 2020-04-28 DIAGNOSIS — Z9641 Presence of insulin pump (external) (internal): Secondary | ICD-10-CM | POA: Insufficient documentation

## 2020-04-28 DIAGNOSIS — I5022 Chronic systolic (congestive) heart failure: Secondary | ICD-10-CM

## 2020-04-28 DIAGNOSIS — Z5181 Encounter for therapeutic drug level monitoring: Secondary | ICD-10-CM | POA: Insufficient documentation

## 2020-04-28 DIAGNOSIS — Z923 Personal history of irradiation: Secondary | ICD-10-CM | POA: Diagnosis not present

## 2020-04-28 DIAGNOSIS — E785 Hyperlipidemia, unspecified: Secondary | ICD-10-CM | POA: Insufficient documentation

## 2020-04-28 DIAGNOSIS — T50905D Adverse effect of unspecified drugs, medicaments and biological substances, subsequent encounter: Secondary | ICD-10-CM | POA: Insufficient documentation

## 2020-04-28 DIAGNOSIS — Z885 Allergy status to narcotic agent status: Secondary | ICD-10-CM | POA: Insufficient documentation

## 2020-04-28 DIAGNOSIS — Z0189 Encounter for other specified special examinations: Secondary | ICD-10-CM

## 2020-04-28 DIAGNOSIS — C50611 Malignant neoplasm of axillary tail of right female breast: Secondary | ICD-10-CM | POA: Insufficient documentation

## 2020-04-28 DIAGNOSIS — E049 Nontoxic goiter, unspecified: Secondary | ICD-10-CM | POA: Diagnosis not present

## 2020-04-28 LAB — ECHOCARDIOGRAM COMPLETE
Area-P 1/2: 3.37 cm2
S' Lateral: 2.5 cm

## 2020-04-28 NOTE — Patient Instructions (Signed)
Please call our office in July 2022 to schedule your follow up appointment  If you have any questions or concerns before your next appointment please send us a message through mychart or call our office at [phone].    TO LEAVE A MESSAGE FOR THE NURSE SELECT OPTION 2, PLEASE LEAVE A MESSAGE INCLUDING: . YOUR NAME . DATE OF BIRTH . CALL BACK NUMBER . REASON FOR CALL**this is important as we prioritize the call backs  YOU WILL RECEIVE A CALL BACK THE SAME DAY AS LONG AS YOU CALL BEFORE 4:00 PM  

## 2020-04-28 NOTE — Progress Notes (Signed)
Advanced Heart Failure Clinic Note   Referring Physician: Dr Lindi Adie PCP: Tisovec, Fransico Him, MD PCP-Cardiologist: Glori Bickers, MD   HPI: Stephanie Holden is a 45 y.o. female with a history of type 1 DM on insulin pump, thyroid goiter, and breast cancer.     Malignant neoplasm of axillary tail of right breast in female, estrogen receptor positive ([place])   05/09/2017 Initial Diagnosis    May 2018: Palpable right axillary mass.  The recommended a 6-week follow-up but apparently the mass decreased in size.  Recent increase in the mass was noted, biopsy revealed Grade 1 IDC with DCIS, lymphovascular invasion present, 4 small hypoechoic masses largest 0.9 cm, T1BN1 stage Ib clinical stage      06/05/2017 Surgery    Bilateral mastectomies: Right mastectomy: IDC grade 1, 3 foci 1.2 cm, 1.2 cm, 1 cm, IgA DCIS, LVH diffuse present, margins negative, 4/7 lymph nodes positive with extracapsular extension, ER 95%, PR 95%, HER-2 negative, Ki-67 2% to 10%, T1 cN2 aM0 stage II a pathological stage; left mastectomy: Benign      06/26/2017 Surgery    Axillary lymph node dissection: 2/22 lymph nodes positive (making a total of 6+ lymph nodes)      07/22/2017 -  Chemotherapy    Adjuvant chemotherapy with dose dense Adriamycin and Cytoxan x4 followed by Taxol weekly x12   We saw her in 6/19 for the first time in the cardio-oncology clinic in 6/19. She had recently completed chemo and echo showed EF 60-65%. Admitted in 10/19 with PNA but then found to have systolic HF with EF 15-17%. Course c/b large R pleural effusion. Initially treated with thoracentesis on 01/13/18 but then recurred so chest tube was placed. Effusion found to be transudative with negative cytology.   Today she returns for HF follow up. Feels good. Not exercising as much any more. No CP or SOB. No edema.   Echo today 04/28/20 EF 60-65% RV normal Personally reviewed   Echo 7/21 EF 50-55% GLS -17.1%  CMRI 07/12/19 as part  of Upbeat study EF 47%   CT cors 12/20: Calcium score 0. No CAD  cMRI 12/19 1.  Normal LV size with moderate diffuse hypokinesis, EF 37%. 2.  Normal RV size with mildly decreased systolic function, EF 61%. 3. No myocardial LGE, so no definitive evidence for prior myocardial infarction, infiltrative disease, or myocarditis.  ECHO 09/10/2018 EF 40-45% GLS 17.6.  Echo 01/28/18 EF 25-30% Grade II DD. Mild RV dysfunction Echo 6/19 EF 60-65% LS 11.90 GLS 18.7%  Review of systems complete and found to be negative unless listed in HPI.   Past Medical History:  Diagnosis Date  . Cancer [facility])    Breast cancer - right  . CHF (congestive heart failure) ([place])   . Constipation   . Depression   . Genetic testing 05/27/2017   Breast/GYN panel (23 genes) @ [facility] - No pathogenic mutations detected  . GERD (gastroesophageal reflux disease)   . History of radiation therapy 12/30/17- 02/10/18   Right chest wall/ 50 Gy in 25 fractions, Right SCV, PAB nodes/ 50 Gy in 25 fractions, Right Chest wall boost 10 gy in 5 fractions.   . Insulin dependent type 1 diabetes mellitus ([place])   . Insulin pump in place   . PONV (postoperative nausea and vomiting)   . Stenosing tenosynovitis of thumb 06/2014   right  . Thyroid goiter     Current Outpatient Medications  Medication Sig Dispense Refill  . carvedilol (COREG) 6.25  MG tablet TAKE 1 TABLET (6.25 MG TOTAL) BY MOUTH 2 (TWO) TIMES DAILY WITH A MEAL. 180 tablet 2  . CONTOUR TEST test strip USE TO TEST BLOOD SUGARS 6 TIMES DAILY ON PUMP DX  E10.9    . furosemide (LASIX) 40 MG tablet Take 40 mg by mouth as needed for fluid or edema.     . insulin aspart (NOVOLOG) 100 UNIT/ML injection Inject 45 Units into the skin See admin instructions. Use via insulin pump up to 45 units daily    . loratadine (CLARITIN) 10 MG tablet Take 10 mg by mouth daily.    Marland Kitchen LORazepam (ATIVAN) 0.5 MG tablet TAKE 1 TABLET BY MOUTH AT BEDTIME AS NEEDED FOR ANXIETY 30 tablet 3  .  potassium chloride SA (K-DUR,KLOR-CON) 20 MEQ tablet Take 10 mEq by mouth daily as needed (with furosemide).     . pravastatin (PRAVACHOL) 20 MG tablet Take 20 mg by mouth daily. Pt not sure of dose, but thinks it is 20.    . Probiotic Product (PROBIOTIC PO) Take by mouth.    . sacubitril-valsartan (ENTRESTO) 97-103 MG Take 1 tablet by mouth 2 (two) times daily. 180 tablet 3  . spironolactone (ALDACTONE) 25 MG tablet TAKE 1/2 TABLET BY MOUTH EVERY DAY 45 tablet 3  . tamoxifen (NOLVADEX) 20 MG tablet TAKE 1 TABLET BY MOUTH EVERY DAY 90 tablet 3  . venlafaxine XR (EFFEXOR-XR) 75 MG 24 hr capsule TAKE 1 CAPSULE (75 MG TOTAL) BY MOUTH DAILY WITH BREAKFAST. 90 capsule 0   No current facility-administered medications for this encounter.    Allergies  Allergen Reactions  . Dilaudid [Hydromorphone Hcl] Itching and Other (See Comments)    UNCONTROLLABLE CRYING      Social History   Socioeconomic History  . Marital status: Married    Spouse name: Not on file  . Number of children: Not on file  . Years of education: Not on file  . Highest education level: Not on file  Occupational History  . Not on file  Tobacco Use  . Smoking status: Former Smoker    Quit date: 03/24/2001    Years since quitting: 19.1  . Smokeless tobacco: Never Used  Vaping Use  . Vaping Use: Never used  Substance and Sexual Activity  . Alcohol use: Not Currently  . Drug use: No  . Sexual activity: Yes    Partners: Male    Birth control/protection: Surgical    Comment: tubal ligation & 1 tube removed  Other Topics Concern  . Not on file  Social History Narrative  . Not on file   Social Determinants of Health   Financial Resource Strain: Not on file  Food Insecurity: Not on file  Transportation Needs: Not on file  Physical Activity: Not on file  Stress: Not on file  Social Connections: Not on file  Intimate Partner Violence: Not on file      Family History  Problem Relation Age of Onset  .  Hypertension Mother   . Hypertension Father   . Breast cancer Other 72       paternal grandfather's niece    Vitals:   04/28/20 1536  BP: 140/80  Pulse: 64  SpO2: 99%  Weight: 87.5 kg (193 lb)   Wt Readings from Last 3 Encounters:  04/28/20 87.5 kg (193 lb)  09/30/19 86.2 kg (190 lb)  07/12/19 84.7 kg (186 lb 12.8 oz)    PHYSICAL EXAM: General:  Well appearing. No resp difficulty HEENT: normal Neck:  supple. no JVD. Carotids 2+ bilat; no bruits. No lymphadenopathy or thryomegaly appreciated. Cor: PMI nondisplaced. Regular rate & rhythm. No rubs, gallops or murmurs. Lungs: clear Abdomen: soft, nontender, nondistended. No hepatosplenomegaly. No bruits or masses. Good bowel sounds. Extremities: no cyanosis, clubbing, rash, edema Neuro: alert & orientedx3, cranial nerves grossly intact. moves all 4 extremities w/o difficulty. Affect pleasant  ASSESSMENT & PLAN:  1. 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 No CAD - NYHA I. Volume status looks good. Continue prn lasix - echo 6/19 EF 60% - echo 11/19 EF 25-30% - cMRI 12/19 EF 37%. No LGE. Out of window for ICD. - ECHO 12/21 EF 55% GLS - 17.1% - cMRI 4/21 as part of Upbeat study EF 47% - Echo  09/30/19 EF 50-55% GLS -17.5% -> EF improved and stabilized. - Echo today 04/28/20 EF 60-65% no RV Personally reviewed - NYHA I  - Continue Entresto to 97/103 bid. - Continue spiro 12.5 mg daily. - Continue coreg to 6.25 mg BID.  - No Farxiga due to Type I DM  2. Malignant neoplasm of axillary tail of right breast in female, estrogen receptor positive  - Diagnosed with breast cancer 05/09/17. Underwent bilateral mastectomies on 06/05/17. Underwent axillary lymph node dissection on 06/26/17. Started chemo 07/22/17 with adriamycin and cytoxan x4, then  taxol weekly x12. Finished XRT on 02/09/18 - echo ok   3. Hyperlipidemia - Her endocrinologist started her on pravastatin recently.   4. Type I, DM - Follows with Dr. Osborne Casco - recent HGBA1c 9.4%  5. Snoring and daytime sleepiness - pending sleep study   Glori Bickers, MD  3:46 PM

## 2020-04-28 NOTE — Progress Notes (Signed)
  Echocardiogram 2D Echocardiogram has been performed.  Stephanie Holden 04/28/2020, 3:28 PM

## 2020-04-28 NOTE — Addendum Note (Signed)
Encounter addended by: Malena Edman, RN on: 04/28/2020 3:57 PM  Actions taken: Clinical Note Signed

## 2020-06-19 ENCOUNTER — Other Ambulatory Visit (HOSPITAL_COMMUNITY): Payer: Self-pay | Admitting: Cardiology

## 2020-07-05 ENCOUNTER — Other Ambulatory Visit (HOSPITAL_COMMUNITY): Payer: Self-pay | Admitting: *Deleted

## 2020-07-05 ENCOUNTER — Encounter (HOSPITAL_COMMUNITY): Payer: Self-pay

## 2020-07-05 MED ORDER — FUROSEMIDE 40 MG PO TABS: 40.0000 mg | ORAL_TABLET | ORAL | 3 refills | Status: DC | PRN

## 2020-07-05 MED ORDER — POTASSIUM CHLORIDE CRYS ER 20 MEQ PO TBCR: 10.0000 meq | EXTENDED_RELEASE_TABLET | Freq: Every day | ORAL | 3 refills | Status: DC | PRN

## 2020-07-15 ENCOUNTER — Other Ambulatory Visit (HOSPITAL_COMMUNITY): Payer: Self-pay | Admitting: Internal Medicine

## 2020-07-16 ENCOUNTER — Other Ambulatory Visit: Payer: Self-pay | Admitting: Hematology and Oncology

## 2020-07-21 ENCOUNTER — Telehealth: Payer: Self-pay | Admitting: Oncology

## 2020-07-21 NOTE — Telephone Encounter (Signed)
07/21/20 - Upbeat Study - 3 Year Follow-up Phone Call  - I called and spoke to patient on the phone to inform her that this call was for her 3 year follow-up for the Upbeat study.  Patient states she is doing well and has had no hospitalizations.  I confirmed with the patient that I had the correct e-mail so that the surveys can be released for her to fill out and return.  The patient was thanked for her time and continued support of this clinical trial. [place] Specialist 1:35 pm

## 2020-09-07 ENCOUNTER — Other Ambulatory Visit (HOSPITAL_COMMUNITY): Payer: Self-pay | Admitting: Internal Medicine

## 2020-09-07 ENCOUNTER — Other Ambulatory Visit: Payer: Self-pay | Admitting: Hematology and Oncology

## 2020-09-23 ENCOUNTER — Other Ambulatory Visit (HOSPITAL_COMMUNITY): Payer: Self-pay | Admitting: Internal Medicine

## 2020-10-11 ENCOUNTER — Other Ambulatory Visit: Payer: Self-pay | Admitting: Hematology and Oncology

## 2020-12-23 ENCOUNTER — Other Ambulatory Visit (HOSPITAL_COMMUNITY): Payer: Self-pay | Admitting: Internal Medicine

## 2021-01-19 ENCOUNTER — Other Ambulatory Visit: Payer: Self-pay | Admitting: Hematology and Oncology

## 2021-01-30 ENCOUNTER — Telehealth: Payer: Self-pay | Admitting: Hematology and Oncology

## 2021-01-30 NOTE — Telephone Encounter (Signed)
Sch per 11/4 inbasket,pt aware

## 2021-03-12 ENCOUNTER — Other Ambulatory Visit (HOSPITAL_COMMUNITY): Payer: Self-pay | Admitting: *Deleted

## 2021-03-12 ENCOUNTER — Encounter (HOSPITAL_COMMUNITY): Payer: Self-pay | Admitting: Internal Medicine

## 2021-03-12 MED ORDER — ENTRESTO 97-103 MG PO TABS: 1.0000 | ORAL_TABLET | Freq: Two times a day (BID) | ORAL | 3 refills | Status: DC

## 2021-03-20 ENCOUNTER — Other Ambulatory Visit (HOSPITAL_COMMUNITY): Payer: Self-pay | Admitting: Cardiology

## 2021-03-20 ENCOUNTER — Other Ambulatory Visit: Payer: Self-pay | Admitting: Hematology and Oncology

## 2021-03-22 ENCOUNTER — Encounter: Payer: Self-pay | Admitting: Hematology and Oncology

## 2021-04-05 ENCOUNTER — Encounter: Payer: Self-pay | Admitting: Hematology and Oncology

## 2021-04-05 NOTE — Assessment & Plan Note (Signed)
06/05/2017:Bilateral mastectomies: Right mastectomy: IDC grade 1, 3 foci 1.2 cm, 1.2 cm, 1 cm, IgA DCIS, LVH diffuse present, margins negative, 4/7 lymph nodes positive with extracapsular extension, ER 95%, PR 95%, HER-2 negative, Ki-67 2% to 10%, T1 cN2 aM0 stage II a pathological stage; left mastectomy: Benign  Mammaprint done preoperatively: Low risk Axillary lymph node dissection 06/26/2017: 2/22 lymph nodes positive(making a total of 6 lymph nodes that were positive)  Treatment plan: 1.Because of high risk features (6 LN Pos),werecommended adjuvant systemic chemotherapywithdose dense Adriamycin and Cytoxan x4 followed by Taxol weekly x12 07/22/2017-12/09/2017 3.Followed by radiation.Started 12/30/2017 4.Followed by adjuvant antiestrogen therapywith tamoxifen started 03/25/2018  UPBEAT clinical trial ([id number]):No toxicities related to the trial --------------------------------------------------------------------------- Acute congestive heart failure secondary to Adriamycin: Hospitalization 01/27/2018 to 02/01/2018: EF 25% Follows with Dr. Haroldine Laws Currently on Entresto and Lasix.  Tamoxifen toxicities: 1.  Mood swings 2.  Hot flashes Markedly improved with Effexor.  However she forgot to take it for the last few days and she has gotten increasing symptoms  Breast cancer surveillance: 1.  Breast exam 04/06/21: Benign bilateral mastectomy scar tissue no palpable lumps or nodules. 2.  Mammograms: Not indicated because she had bilateral mastectomies  Patient has picked up flyfishing and appears to be having a good time with her.  Return to clinic in 1 year for follow-up

## 2021-04-06 ENCOUNTER — Other Ambulatory Visit: Payer: Self-pay

## 2021-04-06 ENCOUNTER — Inpatient Hospital Stay: Payer: 59 | Attending: Hematology and Oncology | Admitting: Hematology and Oncology

## 2021-04-06 DIAGNOSIS — C773 Secondary and unspecified malignant neoplasm of axilla and upper limb lymph nodes: Secondary | ICD-10-CM | POA: Insufficient documentation

## 2021-04-06 DIAGNOSIS — Z9013 Acquired absence of bilateral breasts and nipples: Secondary | ICD-10-CM | POA: Insufficient documentation

## 2021-04-06 DIAGNOSIS — Z923 Personal history of irradiation: Secondary | ICD-10-CM | POA: Diagnosis not present

## 2021-04-06 DIAGNOSIS — C50611 Malignant neoplasm of axillary tail of right female breast: Secondary | ICD-10-CM | POA: Insufficient documentation

## 2021-04-06 DIAGNOSIS — Z9221 Personal history of antineoplastic chemotherapy: Secondary | ICD-10-CM | POA: Diagnosis not present

## 2021-04-06 DIAGNOSIS — Z170 Estrogen receptor positive status [ER+]: Secondary | ICD-10-CM | POA: Diagnosis not present

## 2021-04-06 DIAGNOSIS — I509 Heart failure, unspecified: Secondary | ICD-10-CM | POA: Diagnosis not present

## 2021-04-06 MED ORDER — VENLAFAXINE HCL ER 75 MG PO CP24: 75.0000 mg | ORAL_CAPSULE | Freq: Every day | ORAL | 3 refills | Status: DC

## 2021-04-06 MED ORDER — VENLAFAXINE HCL ER 37.5 MG PO CP24: 37.5000 mg | ORAL_CAPSULE | Freq: Every day | ORAL | 3 refills | Status: DC

## 2021-04-06 NOTE — Progress Notes (Signed)
Patient Care Team: Tisovec, Fransico Him, MD as PCP - General (Internal Medicine) Bensimhon, Shaune Pascal, MD as PCP - Cardiology (Cardiology) Eppie Gibson, MD as Attending Physician (Radiation Oncology) Nicholas Lose, MD as Consulting Physician (Hematology and Oncology) [name], MD as Consulting Physician (General Surgery)  DIAGNOSIS:  Encounter Diagnosis  Name Primary?   Malignant neoplasm of axillary tail of right breast in female, estrogen receptor positive ([place])     SUMMARY OF ONCOLOGIC HISTORY: Oncology History  Malignant neoplasm of axillary tail of right breast in female, estrogen receptor positive ([place])  05/09/2017 Initial Diagnosis   May 2018: Palpable right axillary mass.  The recommended a 6-week follow-up but apparently the mass decreased in size.  Recent increase in the mass was noted, biopsy revealed Grade 1 IDC with DCIS, lymphovascular invasion present, 4 small hypoechoic masses largest 0.9 cm, T1BN1 stage Ib clinical stage   06/05/2017 Surgery   Bilateral mastectomies: Right mastectomy: IDC grade 1, 3 foci 1.2 cm, 1.2 cm, 1 cm, IgA DCIS, LVH diffuse present, margins negative, 4/7 lymph nodes positive with extracapsular extension, ER 95%, PR 95%, HER-2 negative, Ki-67 2% to 10%, T1 cN2 aM0 stage II a pathological stage; left mastectomy: Benign   06/26/2017 Surgery   Axillary lymph node dissection: 2/22 lymph nodes positive (making a total of 6+ lymph nodes)   07/22/2017 - 12/09/2017 Chemotherapy   Adjuvant chemotherapy with dose dense Adriamycin and Cytoxan x4 followed by Taxol weekly x12    12/23/2017 Cancer Staging   Staging form: Breast, AJCC 8th Edition - Pathologic: Stage IB (pT1c, pN2a, cM0, G1, ER+, PR+, HER2-) - Signed by Eppie Gibson, MD on 12/23/2017    12/30/2017 - 02/09/2018 Radiation Therapy   Adjuvant radiation   01/12/2018 Imaging   No PE, patchy consolidation in both lungs groundglass opacities, differential infection versus radiation pneumonitis,  right pleural effusion moderate to large, left pleural effusion moderate   01/27/2018 - 02/01/2018 Hospital Admission   Hospital admission for congestive heart failure with pleural effusion, EF 25% suspected to be related to Adriamycin   03/25/2018 -  Anti-estrogen oral therapy   Tamoxifen daily   Breast cancer metastasized to axillary lymph node, right ([place])  06/05/2017 Initial Diagnosis   Breast cancer metastasized to axillary lymph node, right ([place])   07/22/2017 -  Chemotherapy   Adjuvant chemotherapy with dose dense Adriamycin and Cytoxan x4 followed by Taxol weekly x12      CHIEF COMPLIANT: Surveillance of breast cancer  INTERVAL HISTORY: Stephanie Holden is a 46 year old with above-mentioned history of bilateral mastectomies for right breast cancer.  She finished adjuvant chemotherapy and radiation and is currently on surveillance.  She is taking tamoxifen and appears to be tolerating it extremely well.  She does have emotional mood swings for which she is taking Effexor.  75 mg was not adequate and therefore we are increasing the dosage to 117.5 mg total dosage.  Other than that she is complaining of weight gain issues.  She has not been exercising regularly.   ALLERGIES:  is allergic to dilaudid [hydromorphone hcl].  MEDICATIONS:  Current Outpatient Medications  Medication Sig Dispense Refill   carvedilol (COREG) 6.25 MG tablet Take 1 tablet (6.25 mg total) by mouth 2 (two) times daily with a meal. NEEDS APPOINTMENT FOR MORE REFILLS 60 tablet 0   CONTOUR TEST test strip USE TO TEST BLOOD SUGARS 6 TIMES DAILY ON PUMP DX  E10.9     furosemide (LASIX) 40 MG tablet TAKE 1 TABLET BY  MOUTH EVERY DAY AS NEEDED FOR FLUID/EDEMA 90 tablet 3   insulin aspart (NOVOLOG) 100 UNIT/ML injection Inject 45 Units into the skin See admin instructions. Use via insulin pump up to 45 units daily     KLOR-CON M20 20 MEQ tablet TAKE 1/2 TABLET BY MOUTH EVERY DAY AS NEEDED WITH FUROSEMIDE 45 tablet 2    loratadine (CLARITIN) 10 MG tablet Take 10 mg by mouth daily.     LORazepam (ATIVAN) 0.5 MG tablet TAKE 1 TABLET BY MOUTH AT BEDTIME AS NEEDED FOR ANXIETY 30 tablet 3   pravastatin (PRAVACHOL) 20 MG tablet Take 20 mg by mouth daily. Pt not sure of dose, but thinks it is 20.     Probiotic Product (PROBIOTIC PO) Take by mouth.     sacubitril-valsartan (ENTRESTO) 97-103 MG Take 1 tablet by mouth 2 (two) times daily. 180 tablet 3   spironolactone (ALDACTONE) 25 MG tablet TAKE 1/2 TABLET BY MOUTH EVERY DAY 45 tablet 3   tamoxifen (NOLVADEX) 20 MG tablet TAKE 1 TABLET BY MOUTH EVERY DAY 90 tablet 3   venlafaxine XR (EFFEXOR-XR) 75 MG 24 hr capsule TAKE 1 CAPSULE BY MOUTH DAILY WITH BREAKFAST. 90 capsule 0   No current facility-administered medications for this visit.    PHYSICAL EXAMINATION: ECOG PERFORMANCE STATUS: 1 - Symptomatic but completely ambulatory  Vitals:   04/06/21 1110  BP: 129/66  Pulse: 80  Resp: 18  Temp: (!) 97.3 F (36.3 C)  SpO2: 98%   Filed Weights   04/06/21 1110  Weight: 204 lb (92.5 kg)    BREAST: Bilateral mastectomies no palpable lumps or nodules in chest wall or axilla. (exam performed in the presence of a chaperone)  LABORATORY DATA:  I have reviewed the data as listed CMP Latest Ref Rng & Units 09/30/2019 07/12/2019 03/15/2019  Glucose 70 - 99 mg/dL 216(H) 166(H) 202(H)  BUN 6 - 20 mg/dL _0 Creatinine 0.44 - 1.00 mg/dL 0.78 0.82 0.87  Sodium 135 - 145 mmol/L 136 141 142  Potassium 3.5 - 5.1 mmol/L 4.9 4.1 3.9  Chloride 98 - 111 mmol/L 102 107 106  CO2 22 - 32 mmol/L _1 Calcium 8.9 - 10.3 mg/dL 9.0 8.3(L) 8.9  Total Protein 6.5 - 8.1 g/dL 6.7 6.7 -  Total Bilirubin 0.3 - 1.2 mg/dL 1.3(H) 0.4 -  Alkaline Phos 38 - 126 U/L 72 93 -  AST 15 - 41 U/L 19 14(L) -  ALT 0 - 44 U/L 16 15 -    Lab Results  Component Value Date   WBC 5.4 09/30/2019   HGB 14.3 09/30/2019   HCT 44.2 09/30/2019   MCV 92.1 09/30/2019   PLT 258 09/30/2019    NEUTROABS 2.8 07/12/2019    ASSESSMENT & PLAN:  Malignant neoplasm of axillary tail of right breast in female, estrogen receptor positive ([place]) 06/05/2017:Bilateral mastectomies: Right mastectomy: IDC grade 1, 3 foci 1.2 cm, 1.2 cm, 1 cm, IgA DCIS, LVH diffuse present, margins negative, 4/7 lymph nodes positive with extracapsular extension, ER 95%, PR 95%, HER-2 negative, Ki-67 2% to 10%, T1 cN2 aM0 stage II a pathological stage; left mastectomy: Benign   Mammaprint done preoperatively: Low risk Axillary lymph node dissection 06/26/2017: 2/22 lymph nodes positive (making a total of 6 lymph nodes that were positive)   Treatment plan: 1.  Because of  high risk features (6 LN Pos), we recommended adjuvant systemic chemotherapy with dose dense Adriamycin and Cytoxan x4 followed by Taxol weekly x12  07/22/2017-12/09/2017 3. Followed by radiation.  Started 12/30/2017 4. Followed by adjuvant antiestrogen therapy with tamoxifen started 03/25/2018   UPBEAT clinical trial ([id number]): No toxicities related to the trial --------------------------------------------------------------------------- Acute congestive heart failure secondary to Adriamycin: Hospitalization 01/27/2018 to 02/01/2018: EF 25% Follows with Dr. Haroldine Laws Currently on Entresto and Lasix.   Tamoxifen toxicities: 1.  Mood swings: Increase the dosage of Effexor to 117.5. 2.  Hot flashes     Breast cancer surveillance: 1.  Breast exam 04/06/21: Benign bilateral mastectomy scar tissue no palpable lumps or nodules. 2.  Mammograms: Not indicated because she had bilateral mastectomies   Patient has picked up flyfishing and appears to be having a good time with her. She is also playing virtual reality games for exercise/activity.  She enjoys musical concerts along with her husband.   Return to clinic in 1 year for follow-up   No orders of the defined types were placed in this encounter.  The patient has a good understanding of the overall  plan. she agrees with it. she will call with any problems that may develop before the next visit here. Total time spent: 30 mins including face to face time and time spent for planning, charting and co-ordination of care   Harriette Ohara, MD 04/06/21

## 2021-04-14 ENCOUNTER — Other Ambulatory Visit (HOSPITAL_COMMUNITY): Payer: Self-pay | Admitting: Cardiology

## 2021-05-23 ENCOUNTER — Ambulatory Visit: Admit: 2021-05-23 | Discharge status: Absent without leave | Payer: 59

## 2021-05-24 ENCOUNTER — Ambulatory Visit: Payer: Self-pay

## 2021-06-18 ENCOUNTER — Telehealth (HOSPITAL_COMMUNITY): Payer: Self-pay | Admitting: Pharmacy Technician

## 2021-06-18 ENCOUNTER — Encounter (HOSPITAL_COMMUNITY): Payer: Self-pay | Admitting: Internal Medicine

## 2021-06-18 NOTE — Telephone Encounter (Signed)
Patient Advocate Encounter ?  ?Received notification from [place] that prior authorization for Stephanie Holden is required. ?  ?PA submitted on CoverMyMeds ?Key [id number] ?Status is pending ?  ?Will continue to follow. ? ?

## 2021-06-19 ENCOUNTER — Encounter: Payer: Self-pay | Admitting: Hematology and Oncology

## 2021-06-20 NOTE — Telephone Encounter (Signed)
Advanced Heart Failure Patient Advocate Encounter ? ?Prior Authorization for Delene Loll has been approved.   ? ?PA# [id number] ?Effective dates: 06/18/21 through 06/19/22 ? ?Sent pt mychart message with approval. ? ?Charlann Boxer, CPhT ? ?

## 2021-06-28 ENCOUNTER — Other Ambulatory Visit (HOSPITAL_COMMUNITY): Payer: Self-pay

## 2021-06-28 MED ORDER — CARVEDILOL 6.25 MG PO TABS: ORAL_TABLET | ORAL | 2 refills | Status: DC

## 2021-06-29 ENCOUNTER — Other Ambulatory Visit (HOSPITAL_COMMUNITY): Payer: Self-pay | Admitting: Cardiology

## 2021-07-31 ENCOUNTER — Telehealth: Payer: Self-pay | Admitting: Oncology

## 2021-07-31 NOTE — Telephone Encounter (Signed)
07/31/21 - [id number] - Upbeat study 4 year follow-up call.  Called and spoke to the patient on the phone.  I informed the patient this was her yearly phone call for the Upbeat study.  The patient stated she had not been in the hospital nor had she had any cardiovascular events in the last year.  I informed the patient that she would be receiving the questionnaire by email.   ?I thanked the patient for her continued suppot in the clinical trial. ?Remer Macho ?07/31/21 - 1:37 pm ?

## 2021-09-07 ENCOUNTER — Encounter (HOSPITAL_COMMUNITY): Payer: 59 | Admitting: Internal Medicine

## 2021-09-07 ENCOUNTER — Ambulatory Visit (HOSPITAL_COMMUNITY): Payer: 59

## 2021-10-01 ENCOUNTER — Other Ambulatory Visit (HOSPITAL_COMMUNITY): Payer: Self-pay | Admitting: Cardiology

## 2021-10-02 ENCOUNTER — Other Ambulatory Visit (HOSPITAL_COMMUNITY): Payer: Self-pay | Admitting: Cardiology

## 2021-12-07 ENCOUNTER — Other Ambulatory Visit (HOSPITAL_COMMUNITY): Payer: Self-pay

## 2021-12-07 DIAGNOSIS — C50611 Malignant neoplasm of axillary tail of right female breast: Secondary | ICD-10-CM

## 2021-12-07 DIAGNOSIS — I5022 Chronic systolic (congestive) heart failure: Secondary | ICD-10-CM

## 2021-12-21 ENCOUNTER — Ambulatory Visit (HOSPITAL_COMMUNITY)
Admission: RE | Admit: 2021-12-21 | Discharge: 2021-12-21 | Disposition: A | Discharge status: Home / Self Care | Payer: 59 | Source: Ambulatory Visit | Attending: Internal Medicine | Admitting: Internal Medicine

## 2021-12-21 ENCOUNTER — Encounter (HOSPITAL_COMMUNITY): Payer: Self-pay | Admitting: Internal Medicine

## 2021-12-21 ENCOUNTER — Ambulatory Visit (HOSPITAL_BASED_OUTPATIENT_CLINIC_OR_DEPARTMENT_OTHER)
Admission: RE | Admit: 2021-12-21 | Discharge: 2021-12-21 | Disposition: A | Discharge status: Home / Self Care | Payer: 59 | Source: Ambulatory Visit | Attending: Internal Medicine | Admitting: Internal Medicine

## 2021-12-21 VITALS — BP 108/76 | HR 76 | Wt 162.0 lb

## 2021-12-21 DIAGNOSIS — I5022 Chronic systolic (congestive) heart failure: Secondary | ICD-10-CM

## 2021-12-21 DIAGNOSIS — Z794 Long term (current) use of insulin: Secondary | ICD-10-CM | POA: Diagnosis not present

## 2021-12-21 DIAGNOSIS — Z9641 Presence of insulin pump (external) (internal): Secondary | ICD-10-CM | POA: Insufficient documentation

## 2021-12-21 DIAGNOSIS — Z923 Personal history of irradiation: Secondary | ICD-10-CM | POA: Diagnosis not present

## 2021-12-21 DIAGNOSIS — C50611 Malignant neoplasm of axillary tail of right female breast: Secondary | ICD-10-CM | POA: Diagnosis not present

## 2021-12-21 DIAGNOSIS — E785 Hyperlipidemia, unspecified: Secondary | ICD-10-CM | POA: Diagnosis not present

## 2021-12-21 DIAGNOSIS — Z0189 Encounter for other specified special examinations: Secondary | ICD-10-CM

## 2021-12-21 DIAGNOSIS — Z79899 Other long term (current) drug therapy: Secondary | ICD-10-CM | POA: Insufficient documentation

## 2021-12-21 DIAGNOSIS — Z170 Estrogen receptor positive status [ER+]: Secondary | ICD-10-CM | POA: Insufficient documentation

## 2021-12-21 DIAGNOSIS — Z853 Personal history of malignant neoplasm of breast: Secondary | ICD-10-CM | POA: Diagnosis not present

## 2021-12-21 DIAGNOSIS — E109 Type 1 diabetes mellitus without complications: Secondary | ICD-10-CM | POA: Diagnosis not present

## 2021-12-21 DIAGNOSIS — E782 Mixed hyperlipidemia: Secondary | ICD-10-CM | POA: Diagnosis not present

## 2021-12-21 DIAGNOSIS — Z9013 Acquired absence of bilateral breasts and nipples: Secondary | ICD-10-CM | POA: Insufficient documentation

## 2021-12-21 LAB — ECHOCARDIOGRAM COMPLETE
Area-P 1/2: 3.19 cm2
Calc EF: 50.3 %
S' Lateral: 3.2 cm
Single Plane A2C EF: 48.6 %
Single Plane A4C EF: 49.6 %

## 2021-12-21 NOTE — Patient Instructions (Signed)
There has been no changes to your medications.  Your physician has requested that you have an echocardiogram. Echocardiography is a painless test that uses sound waves to create images of your heart. It provides your doctor with information about the size and shape of your heart and how well your heart's chambers and valves are working. This procedure takes approximately one hour. There are no restrictions for this procedure.  Your physician recommends that you schedule a follow-up appointment in: 1 year ( September 2024)  ** please call the office in June 2024 to arrange your follow up appointment **  If you have any questions or concerns before your next appointment please send [place] a message through [place] or call our office at [phone].    TO LEAVE A MESSAGE FOR THE NURSE SELECT OPTION 2, PLEASE LEAVE A MESSAGE INCLUDING: YOUR NAME DATE OF BIRTH CALL BACK NUMBER REASON FOR CALL**this is important as we prioritize the call backs  YOU WILL RECEIVE A CALL BACK THE SAME DAY AS LONG AS YOU CALL BEFORE 4:00 PM  At the [facility], you and your health needs are our priority. As part of our continuing mission to provide you with exceptional heart care, we have created designated Provider Care Teams. These Care Teams include your primary Cardiologist (physician) and Advanced Practice Providers (APPs- Physician Assistants and Nurse Practitioners) who all work together to provide you with the care you need, when you need it.   You may see any of the following providers on your designated Care Team at your next follow up: Dr Glori Bickers Dr Loralie Champagne Dr. Roxana Hires, NP Lyda Jester, [place] [facility] [place], [place] Forestine Na, NP Audry Riles, PharmD   Please be sure to bring in all your medications bottles to every appointment.

## 2021-12-21 NOTE — Addendum Note (Signed)
Encounter addended by: Jerl Mina, RN on: 12/21/2021 12:05 PM  Actions taken: Order list changed, Diagnosis association updated, Clinical Note Signed

## 2021-12-21 NOTE — Progress Notes (Signed)
Advanced Heart Failure Clinic Note   Referring Physician: Dr Lindi Adie PCP: Tisovec, Fransico Him, MD PCP-Cardiologist: Glori Bickers, MD   HPI: Stephanie Holden is a 46 y.o. female with a history of type 1 DM on insulin pump, thyroid goiter, and breast cancer.      Malignant neoplasm of axillary tail of right breast in female, estrogen receptor positive ([place])    05/09/2017 Initial Diagnosis      May 2018: Palpable right axillary mass.  The recommended a 6-week follow-up but apparently the mass decreased in size.  Recent increase in the mass was noted, biopsy revealed Grade 1 IDC with DCIS, lymphovascular invasion present, 4 small hypoechoic masses largest 0.9 cm, T1BN1 stage Ib clinical stage         06/05/2017 Surgery      Bilateral mastectomies: Right mastectomy: IDC grade 1, 3 foci 1.2 cm, 1.2 cm, 1 cm, IgA DCIS, LVH diffuse present, margins negative, 4/7 lymph nodes positive with extracapsular extension, ER 95%, PR 95%, HER-2 negative, Ki-67 2% to 10%, T1 cN2 aM0 stage II a pathological stage; left mastectomy: Benign         06/26/2017 Surgery      Axillary lymph node dissection: 2/22 lymph nodes positive (making a total of 6+ lymph nodes)         07/22/2017 -  Chemotherapy      Adjuvant chemotherapy with dose dense Adriamycin and Cytoxan x4 followed by Taxol weekly x12   We saw her in 6/19 for the first time in the cardio-oncology clinic in 6/19. She had recently completed chemo and echo showed EF 60-65%. Admitted in 10/19 with PNA but then found to have systolic HF with EF 51-88%. Course c/b large R pleural effusion. Initially treated with thoracentesis on 01/13/18 but then recurred so chest tube was placed. Effusion found to be transudative with negative cytology.   Today she returns for HF follow up. Feels great. Has lost 45 pounds with [place] program. Active. No SOB, CP, orthopnea or PND.  Echo today 12/21/21: EF 55-60% GLS -17.3%  Echo 04/28/20 EF 60-65% RV normal Personally  reviewed  Echo 7/21 EF 50-55% GLS -17.1%  CMRI 07/12/19 as part of Upbeat study EF 47%   CT cors 12/20: Calcium score 0. No CAD  cMRI 12/19 1.  Normal LV size with moderate diffuse hypokinesis, EF 37%. 2.  Normal RV size with mildly decreased systolic function, EF 41%. 3. No myocardial LGE, so no definitive evidence for prior myocardial infarction, infiltrative disease, or myocarditis.  ECHO 09/10/2018 EF 40-45% GLS 17.6.  Echo 01/28/18 EF 25-30% Grade II DD. Mild RV dysfunction Echo 6/19 EF 60-65% LS 11.90 GLS 18.7%  Review of systems complete and found to be negative unless listed in HPI.   Past Medical History:  Diagnosis Date   Cancer [facility])    Breast cancer - right   CHF (congestive heart failure) ([place])    Constipation    Depression    Genetic testing 05/27/2017   Breast/GYN panel (23 genes) @ [facility] - No pathogenic mutations detected   GERD (gastroesophageal reflux disease)    History of radiation therapy 12/30/17- 02/10/18   Right chest wall/ 50 Gy in 25 fractions, Right SCV, PAB nodes/ 50 Gy in 25 fractions, Right Chest wall boost 10 gy in 5 fractions.    Insulin dependent type 1 diabetes mellitus ([facility])    Insulin pump in place    PONV (postoperative nausea and vomiting)    Stenosing tenosynovitis of  thumb 06/2014   right   Thyroid goiter     Current Outpatient Medications  Medication Sig Dispense Refill   carvedilol (COREG) 6.25 MG tablet TAKE 1 TABLET BY MOUTH TWICE DAILY WITH A MEAL 60 tablet 2   CONTOUR TEST test strip USE TO TEST BLOOD SUGARS 6 TIMES DAILY ON PUMP DX  E10.9     furosemide (LASIX) 40 MG tablet TAKE 1 TABLET BY MOUTH EVERY DAY AS NEEDED FOR FLUID/EDEMA 90 tablet 3   insulin aspart (NOVOLOG) 100 UNIT/ML injection Inject 45 Units into the skin See admin instructions. Use via insulin pump up to 45 units daily     KLOR-CON M20 20 MEQ tablet TAKE 1/2 TABLET BY MOUTH EVERY DAY AS NEEDED WITH FUROSEMIDE 45 tablet 2   LORazepam (ATIVAN) 0.5 MG tablet  TAKE 1 TABLET BY MOUTH AT BEDTIME AS NEEDED FOR ANXIETY 30 tablet 3   pravastatin (PRAVACHOL) 20 MG tablet Take 20 mg by mouth daily.     Probiotic Product (PROBIOTIC PO) Take by mouth daily.     sacubitril-valsartan (ENTRESTO) 97-103 MG Take 1 tablet by mouth 2 (two) times daily. 180 tablet 3   spironolactone (ALDACTONE) 25 MG tablet TAKE 1/2 TABLET BY MOUTH EVERY DAY 45 tablet 3   tamoxifen (NOLVADEX) 20 MG tablet TAKE 1 TABLET BY MOUTH EVERY DAY 90 tablet 3   venlafaxine XR (EFFEXOR-XR) 37.5 MG 24 hr capsule Take 1 capsule (37.5 mg total) by mouth daily with breakfast. (Patient taking differently: Take 37.5 mg by mouth as needed.) 90 capsule 3   venlafaxine XR (EFFEXOR-XR) 75 MG 24 hr capsule Take 1 capsule (75 mg total) by mouth daily with breakfast. 90 capsule 3   No current facility-administered medications for this encounter.    Allergies  Allergen Reactions   Dilaudid [Hydromorphone Hcl] Itching and Other (See Comments)    UNCONTROLLABLE CRYING      Social History   Socioeconomic History   Marital status: Married    Spouse name: Not on file   Number of children: Not on file   Years of education: Not on file   Highest education level: Not on file  Occupational History   Not on file  Tobacco Use   Smoking status: Former    Types: Cigarettes    Quit date: 03/24/2001    Years since quitting: 20.7   Smokeless tobacco: Never  Vaping Use   Vaping Use: Never used  Substance and Sexual Activity   Alcohol use: Not Currently   Drug use: No   Sexual activity: Yes    Partners: Male    Birth control/protection: Surgical    Comment: tubal ligation & 1 tube removed  Other Topics Concern   Not on file  Social History Narrative   Not on file   Social Determinants of Health   Financial Resource Strain: Not on file  Food Insecurity: Not on file  Transportation Needs: No Transportation Needs (12/22/2017)   PRAPARE - Hydrologist (Medical): No     Lack of Transportation (Non-Medical): No  Physical Activity: Not on file  Stress: Not on file  Social Connections: Not on file  Intimate Partner Violence: Not At Risk (12/22/2017)   Humiliation, Afraid, Rape, and Kick questionnaire    Fear of Current or Ex-Partner: No    Emotionally Abused: No    Physically Abused: No    Sexually Abused: No      Family History  Problem Relation Age of Onset  Hypertension Mother    Hypertension Father    Breast cancer Other 37       paternal grandfather's niece    Vitals:   12/21/21 1113  BP: 108/76  Pulse: 76  SpO2: 98%  Weight: 73.5 kg (162 lb)   Wt Readings from Last 3 Encounters:  12/21/21 73.5 kg (162 lb)  04/06/21 92.5 kg (204 lb)  04/28/20 87.5 kg (193 lb)    PHYSICAL EXAM: General:  Well appearing. No resp difficulty HEENT: normal Neck: supple. no JVD. Carotids 2+ bilat; no bruits. No lymphadenopathy or thryomegaly appreciated. Cor: PMI nondisplaced. Regular rate & rhythm. No rubs, gallops or murmurs. Lungs: clear Abdomen: soft, nontender, nondistended. No hepatosplenomegaly. No bruits or masses. Good bowel sounds. Extremities: no cyanosis, clubbing, rash, edema Neuro: alert & orientedx3, cranial nerves grossly intact. moves all 4 extremities w/o difficulty. Affect pleasant  NSR 80 No ST-T wave abnormalities. Personally reviewed   ASSESSMENT & PLAN:  1. 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 No CAD - NYHA I. Volume status looks good. Continue prn lasix - echo 6/19 EF 60% - echo 11/19 EF 25-30% - cMRI 12/19 EF 37%. No LGE. Out of window for ICD. - ECHO 12/21 EF 55% GLS - 17.1% - cMRI 4/21 as part of Upbeat study EF 47% - Echo  09/30/19 EF 50-55% GLS -17.5% -> EF improved and stabilized. - Echo 04/28/20 EF 60-65% - Echo today  12/21/21 EF 55-60% GLS -17.3% - NYHA I - Continue Entresto to 97/103 bid. - Continue spiro 12.5 mg daily. - Continue coreg to 6.25 mg BID.  - No Farxiga due to Type I DM - Doing very well EF remains normal. Continue current meds.   2. Malignant neoplasm of axillary tail of right breast in female, estrogen receptor positive  - Diagnosed with breast cancer 05/09/17. Underwent bilateral mastectomies on 06/05/17. Underwent axillary lymph node dissection on 06/26/17. Started chemo 07/22/17 with adriamycin and cytoxan x4, then taxol weekly x12. Finished XRT on 02/09/18 - echo ok   3. Hyperlipidemia - Continue prava per her Endocrinologist  4. Type I, DM - Follows with Dr. Osborne Casco - A1c down to 7.2%   Glori Bickers, MD  11:49 AM

## 2022-01-02 ENCOUNTER — Encounter (HOSPITAL_COMMUNITY): Payer: Self-pay | Admitting: Internal Medicine

## 2022-01-03 ENCOUNTER — Other Ambulatory Visit (HOSPITAL_COMMUNITY): Payer: Self-pay

## 2022-01-03 MED ORDER — CARVEDILOL 6.25 MG PO TABS: ORAL_TABLET | ORAL | 3 refills | Status: DC

## 2022-02-06 ENCOUNTER — Encounter: Payer: Self-pay | Admitting: Radiology

## 2022-02-06 ENCOUNTER — Encounter: Payer: Self-pay | Admitting: Hematology and Oncology

## 2022-02-06 ENCOUNTER — Other Ambulatory Visit (HOSPITAL_COMMUNITY)
Admission: RE | Admit: 2022-02-06 | Discharge: 2022-02-06 | Disposition: A | Discharge status: Home / Self Care | Payer: 59 | Source: Ambulatory Visit | Attending: Radiology | Admitting: Radiology

## 2022-02-06 ENCOUNTER — Ambulatory Visit (INDEPENDENT_AMBULATORY_CARE_PROVIDER_SITE_OTHER): Payer: 59 | Admitting: Radiology

## 2022-02-06 VITALS — BP 102/74 | Ht 62.25 in | Wt 152.0 lb

## 2022-02-06 DIAGNOSIS — N939 Abnormal uterine and vaginal bleeding, unspecified: Secondary | ICD-10-CM | POA: Diagnosis not present

## 2022-02-06 DIAGNOSIS — Z79810 Long term (current) use of selective estrogen receptor modulators (SERMs): Secondary | ICD-10-CM | POA: Diagnosis not present

## 2022-02-06 DIAGNOSIS — Z01419 Encounter for gynecological examination (general) (routine) without abnormal findings: Secondary | ICD-10-CM | POA: Insufficient documentation

## 2022-02-06 NOTE — Progress Notes (Signed)
   Stephanie Holden 1975-10-24 [id number]   History:  46 y.o. G3P2 presents for annual exam. S/p double mastectomy for breast cancer, Has been on tamoxifen since 2019. C/o vaginal spotting the past few months.  Gynecologic History No LMP recorded. (Menstrual status: Chemotherapy).   Contraception/Family planning: essure Sexually active: yes Last Pap: 2020. Results were: normal   Obstetric History OB History  Gravida Para Term Preterm AB Living  '3 2 1 1 1 2  '$ SAB IAB Ectopic Multiple Live Births  1            # Outcome Date GA Lbr Len/2nd Weight Sex Delivery Anes PTL Lv  3 SAB           2 Preterm           1 Term              The following portions of the patient's history were reviewed and updated as appropriate: allergies, current medications, past family history, past medical history, past social history, past surgical history, and problem list.  Review of Systems Pertinent items noted in HPI and remainder of comprehensive ROS otherwise negative.   Past medical history, past surgical history, family history and social history were all reviewed and documented in the EPIC chart.   Exam:  Vitals:   02/06/22 0859  BP: 102/74  Weight: 152 lb (68.9 kg)  Height: 5' 2.25" (1.581 m)   Body mass index is 27.58 kg/m.  General appearance:  Normal Thyroid:  Symmetrical, normal in size, without palpable masses or nodularity. Respiratory  Auscultation:  Clear without wheezing or rhonchi Cardiovascular  Auscultation:  Regular rate, without rubs, murmurs or gallops  Edema/varicosities:  Not grossly evident Abdominal  Soft,nontender, without masses, guarding or rebound.  Liver/spleen:  No organomegaly noted  Hernia:  None appreciated  Skin  Inspection:  Grossly normal Breasts: double mastectomy without reconstruction. No axillary lymphadenopathy Genitourinary   Inguinal/mons:  Normal without inguinal adenopathy  External genitalia:  Normal appearing vulva with no masses,  tenderness, or lesions. Piercing present of clitoral hood, perineum and bilateral labial majora  BUS/Urethra/Skene's glands:  Normal without masses or exudate  Vagina:  Normal appearing with normal color and discharge, no lesions  Cervix:  Normal appearing without discharge or lesions  Uterus:  Normal in size, shape and contour.  Mobile, nontender  Adnexa/parametria:     Rt: Normal in size, without masses or tenderness.   Lt: Normal in size, without masses or tenderness.  Anus and perineum: Normal   Patient informed chaperone available to be present for breast and pelvic exam. Patient has requested no chaperone to be present. Patient has been advised what will be completed during breast and pelvic exam.   Assessment/Plan:   1. Well woman exam with routine gynecological exam - Cytology - PAP( [place])  2. Long-term current use of tamoxifen  3. Abnormal uterine bleeding (AUB) - US Transvaginal Non-OB; Future     Discussed colonoscopy and DEXA screening as directed/appropriate. Recommend 157mns of exercise weekly, including weight bearing exercise. Encouraged the use of seatbelts and sunscreen. Return in 1 year for annual or as needed.   CRubbie BattiestB WHNP-BC 10:21 AM 02/06/2022

## 2022-02-11 LAB — CYTOLOGY - PAP
Adequacy: ABSENT
Comment: NEGATIVE
Diagnosis: NEGATIVE
High risk HPV: NEGATIVE

## 2022-02-19 ENCOUNTER — Other Ambulatory Visit: Payer: Self-pay | Admitting: Hematology and Oncology

## 2022-02-19 ENCOUNTER — Encounter: Payer: Self-pay | Admitting: Hematology and Oncology

## 2022-02-19 MED ORDER — LORAZEPAM 0.5 MG PO TABS: ORAL_TABLET | ORAL | 3 refills | Status: DC

## 2022-02-28 ENCOUNTER — Ambulatory Visit (INDEPENDENT_AMBULATORY_CARE_PROVIDER_SITE_OTHER): Payer: 59

## 2022-02-28 ENCOUNTER — Ambulatory Visit (INDEPENDENT_AMBULATORY_CARE_PROVIDER_SITE_OTHER): Payer: 59 | Admitting: Radiology

## 2022-02-28 ENCOUNTER — Other Ambulatory Visit (HOSPITAL_COMMUNITY)
Admission: RE | Admit: 2022-02-28 | Discharge: 2022-02-28 | Disposition: A | Discharge status: Home / Self Care | Payer: 59 | Source: Ambulatory Visit | Attending: Radiology | Admitting: Radiology

## 2022-02-28 VITALS — BP 124/76

## 2022-02-28 DIAGNOSIS — R9389 Abnormal findings on diagnostic imaging of other specified body structures: Secondary | ICD-10-CM | POA: Insufficient documentation

## 2022-02-28 DIAGNOSIS — Z9289 Personal history of other medical treatment: Secondary | ICD-10-CM | POA: Diagnosis not present

## 2022-02-28 DIAGNOSIS — N939 Abnormal uterine and vaginal bleeding, unspecified: Secondary | ICD-10-CM

## 2022-02-28 DIAGNOSIS — Z79810 Long term (current) use of selective estrogen receptor modulators (SERMs): Secondary | ICD-10-CM

## 2022-02-28 HISTORY — PX: ENDOMETRIAL BIOPSY: SHX622

## 2022-02-28 MED ORDER — ACETAMINOPHEN 325 MG PO TABS: 650.0000 mg | ORAL_TABLET | Freq: Four times a day (QID) | ORAL | Status: DC | PRN

## 2022-02-28 NOTE — Progress Notes (Signed)
      ENDOMETRIAL BIOPSY       Stephanie Holden 46 y.o. presents for u/s and endometrial biopsy. Has been on tamoxfien x 3 years.  Reason for biopsy: thickened endometrium 56m. Ultrasound also showed remnants of essure device in uterus and tube.  The indications for endometrial biopsy were reviewed.    Risks of the biopsy including cramping, bleeding, infection, uterine perforation, inadequate specimen and need for additional procedures  were discussed.  The patient states she understands and agrees to undergo procedure today. Consent obtained.  Time out was performed.   Speculum inserted into the vagina, cervix visualized and was prepped with Betadine. A single-toothed tenaculum was placed on the anterior lip of the cervix to stabilize it.  The 3 mm pipelle was introduced into the endometrial cavity without difficulty to a depth of 7cm, suction initiated and a moderate amount of tissue was obtained and sent to pathology.  The instruments were removed from the patient's vagina.  Minimal bleeding from the cervix was noted.  The patient tolerated the procedure well.     Assessment/Plan: 1. History of endometrial biopsy - acetaminophen (TYLENOL) tablet 650 mg  2. Thickened endometrium - Surgical pathology( [place]/ [facility])  3. Long-term current use of tamoxifen    JRubbie Battiest [facility]

## 2022-03-04 LAB — SURGICAL PATHOLOGY

## 2022-03-05 ENCOUNTER — Telehealth: Payer: Self-pay

## 2022-03-05 ENCOUNTER — Other Ambulatory Visit: Payer: Self-pay

## 2022-03-05 DIAGNOSIS — R9389 Abnormal findings on diagnostic imaging of other specified body structures: Secondary | ICD-10-CM

## 2022-03-05 DIAGNOSIS — N939 Abnormal uterine and vaginal bleeding, unspecified: Secondary | ICD-10-CM

## 2022-03-05 NOTE — Telephone Encounter (Signed)
When calling pt to advise her of EMB results/recommendations she requested that I inquire about anything that she can do or take to make the procedure easier.   Pt advised that even though we are going inside the cervix like the EMB, we are inserting saline vs trying to remove tissue from the uterus so cramping will be different but unsure if provider will want to prescribe anything prior or consider a paracervical block before sonohyst to make that part easier. Please advise.   (Please forward to Dr. Talbert Nan if needed, thanks)

## 2022-03-06 NOTE — Telephone Encounter (Signed)
Per JJ she does not think hyst is the appropriate next step. Needs to be moved up sooner for sonohyst- ok to move others if needed to get her in ASAP

## 2022-03-11 NOTE — Telephone Encounter (Signed)
I would recommend that she take ibuprofen 800 mg prior to the sonohysterogram. It should be significantly less uncomfortable than the endometrial biopsy. The paracervical block would hurt more than the sonohysterogram.

## 2022-03-12 NOTE — Telephone Encounter (Signed)
Patient informed. 

## 2022-03-22 ENCOUNTER — Telehealth: Payer: Self-pay | Admitting: Hematology and Oncology

## 2022-03-22 NOTE — Telephone Encounter (Signed)
Rescheduled appointment per provider PAL. Patient is aware of the changes made to her upcoming appointment. 

## 2022-04-01 ENCOUNTER — Other Ambulatory Visit: Payer: Self-pay | Admitting: *Deleted

## 2022-04-01 MED ORDER — ENTRESTO 97-103 MG PO TABS: 1.0000 | ORAL_TABLET | Freq: Two times a day (BID) | ORAL | 3 refills | Status: DC

## 2022-04-05 ENCOUNTER — Encounter: Payer: Self-pay | Admitting: Hematology and Oncology

## 2022-04-05 ENCOUNTER — Other Ambulatory Visit: Payer: Self-pay | Admitting: *Deleted

## 2022-04-05 ENCOUNTER — Telehealth: Payer: Self-pay | Admitting: *Deleted

## 2022-04-05 MED ORDER — VENLAFAXINE HCL ER 75 MG PO CP24: 75.0000 mg | ORAL_CAPSULE | Freq: Every day | ORAL | 3 refills | Status: DC

## 2022-04-05 MED ORDER — VENLAFAXINE HCL ER 37.5 MG PO CP24: 37.5000 mg | ORAL_CAPSULE | ORAL | 3 refills | Status: DC | PRN

## 2022-04-05 NOTE — Telephone Encounter (Signed)
Refills for Effexor was sent to [facility] in [place]. Called [facility] on [address] to cancel refills sent on 12/26.

## 2022-04-08 ENCOUNTER — Ambulatory Visit: Payer: 59 | Admitting: Hematology and Oncology

## 2022-04-10 ENCOUNTER — Telehealth: Payer: Self-pay | Admitting: Hematology and Oncology

## 2022-04-10 NOTE — Telephone Encounter (Signed)
Rescheduled appointment per provider PAL. Patient is aware of the changes made to her upcoming appointment. 

## 2022-04-11 ENCOUNTER — Other Ambulatory Visit: Payer: Self-pay | Admitting: Radiology

## 2022-04-11 ENCOUNTER — Encounter: Payer: Self-pay | Admitting: Obstetrics and Gynecology

## 2022-04-11 ENCOUNTER — Ambulatory Visit (INDEPENDENT_AMBULATORY_CARE_PROVIDER_SITE_OTHER): Payer: 59

## 2022-04-11 ENCOUNTER — Ambulatory Visit (INDEPENDENT_AMBULATORY_CARE_PROVIDER_SITE_OTHER): Payer: 59 | Admitting: Obstetrics and Gynecology

## 2022-04-11 VITALS — BP 124/78 | HR 94 | Wt 145.0 lb

## 2022-04-11 DIAGNOSIS — N840 Polyp of corpus uteri: Secondary | ICD-10-CM | POA: Diagnosis not present

## 2022-04-11 DIAGNOSIS — R9389 Abnormal findings on diagnostic imaging of other specified body structures: Secondary | ICD-10-CM

## 2022-04-11 DIAGNOSIS — N939 Abnormal uterine and vaginal bleeding, unspecified: Secondary | ICD-10-CM

## 2022-04-11 DIAGNOSIS — N950 Postmenopausal bleeding: Secondary | ICD-10-CM

## 2022-04-11 NOTE — Progress Notes (Signed)
GYNECOLOGY  VISIT   HPI: 47 y.o.   Married White or Caucasian Not Hispanic or Latino  female   [phone] with No LMP recorded. (Menstrual status: Chemotherapy).   here for evaluation of AUB.   H/O double mastectomy for breast cancer, on tamoxifen since 2019. She presented in 11/23 c/o a several month h/o vaginal spotting after years of amenorrhea. Ultrasound from 03/06/22 showed a 15 mm endometrium with vascularity and a ? Partially expulsed essure device (seen in the endometrial cavity).  Endometrial biopsy from 02/28/22 A. ENDOMETRIUM, BIOPSY:  - Scant strips of benign endometrium and benign endocervical mucosa  - No hyperplasia or malignancy   She stopped the tamoxifen last month.   GYNECOLOGIC HISTORY: No LMP recorded. (Menstrual status: Chemotherapy). Contraception:essure, PMP Menopausal hormone therapy: tamoxifen, recently stopped it.         OB History     Gravida  3   Para  2   Term  1   Preterm  1   AB  1   Living  2      SAB  1   IAB      Ectopic      Multiple      Live Births                 Patient Active Problem List   Diagnosis Date Noted   Acute respiratory failure with hypoxia ([place]) 01/27/2018   Anemia 01/27/2018   Chest tube in place    HCAP (healthcare-associated pneumonia) 01/13/2018   Pleural effusion 01/13/2018   Breast cancer metastasized to axillary lymph node, right ([place]) 06/05/2017   Genetic testing 05/27/2017   Malignant neoplasm of axillary tail of right breast in female, estrogen receptor positive ([place]) 05/09/2017   Cubital tunnel syndrome on left 10/02/2015   Trigger finger, left index finger 04/12/2015   Diabetes type 1, controlled ([place]) 01/12/2013    Past Medical History:  Diagnosis Date   Cancer ([place])    Breast cancer - right   CHF (congestive heart failure) (HCC)    Constipation    Depression    Genetic testing 05/27/2017   Breast/GYN panel (23 genes) @ [facility] - No pathogenic mutations detected   GERD  (gastroesophageal reflux disease)    History of radiation therapy 12/30/17- 02/10/18   Right chest wall/ 50 Gy in 25 fractions, Right SCV, PAB nodes/ 50 Gy in 25 fractions, Right Chest wall boost 10 gy in 5 fractions.    Insulin dependent type 1 diabetes mellitus (HCC)    Insulin pump in place    PONV (postoperative nausea and vomiting)    Stenosing tenosynovitis of thumb 06/2014   right   Thyroid goiter     Past Surgical History:  Procedure Laterality Date   AXILLARY LYMPH NODE DISSECTION Right 06/26/2017   Procedure: AXILLARY LYMPH NODE DISSECTION;  Surgeon: [name], MD;  Location: [place];  Service: General;  Laterality: Right;   CARPAL TUNNEL RELEASE Bilateral 10/28/2013   Procedure: BILATERAL CARPAL TUNNEL RELEASE;  Surgeon: Daryll Brod, MD;  Location: [place];  Service: Orthopedics;  Laterality: Bilateral;   CESAREAN SECTION  2000; 08/20/2001   DILATION AND CURETTAGE OF UTERUS     ESSURE TUBAL LIGATION  2005   failed, tubal puncture   LAPAROSCOPIC TUBAL LIGATION  04/02/2007   removal Essure   LAPAROSCOPIC UNILATERAL SALPINGECTOMY  2005   MASS EXCISION Left 03/11/2018   Procedure: EXCISION LEFT CHEST WALL MASS;  Surgeon: [name], MD;  Location: [place];  Service: General;  Laterality: Left;   MASTECTOMY W/ SENTINEL NODE BIOPSY Bilateral 06/05/2017   Procedure: BILATERAL MASTECTOMIES  WITH RIGHT SENTINEL LYMPH NODE BIOPSY;  Surgeon: [name], MD;  Location: [place];  Service: General;  Laterality: Bilateral;   PORT-A-CATH REMOVAL N/A 03/11/2018   Procedure: REMOVAL PORT-A-CATH;  Surgeon: [name], MD;  Location: [place];  Service: General;  Laterality: N/A;   PORTACATH PLACEMENT Left 06/26/2017   Procedure: INSERTION PORT-A-CATH;  Surgeon: [name], MD;  Location: [place];  Service: General;  Laterality: Left;   TRIGGER FINGER RELEASE Left 04/12/2014   Procedure: RELEASE A-1 PULLEY LEFT THUMB;  Surgeon: Daryll Brod, MD;  Location: [place];   Service: Orthopedics;  Laterality: Left;   TRIGGER FINGER RELEASE Right 07/19/2014   Procedure: RELEASE TRIGGER FINGER/A-1 PULLEY RIGHT THUMB;  Surgeon: Daryll Brod, MD;  Location: [place];  Service: Orthopedics;  Laterality: Right;   TUBAL LIGATION      Current Outpatient Medications  Medication Sig Dispense Refill   amphetamine-dextroamphetamine (ADDERALL XR) 20 MG 24 hr capsule Take 20 mg by mouth every morning.     carvedilol (COREG) 6.25 MG tablet TAKE 1 TABLET BY MOUTH TWICE DAILY WITH A MEAL 180 tablet 3   Continuous Blood Gluc Sensor (DEXCOM G6 SENSOR) MISC Apply topically every 3 (three) days.     CONTOUR TEST test strip USE TO TEST BLOOD SUGARS 6 TIMES DAILY ON PUMP DX  E10.9     furosemide (LASIX) 40 MG tablet TAKE 1 TABLET BY MOUTH EVERY DAY AS NEEDED FOR FLUID/EDEMA 90 tablet 3   HUMALOG 100 UNIT/ML injection Inject into the skin.     Insulin Disposable Pump (OMNIPOD 5 G6 POD, GEN 5,) MISC SMARTSIG:SUB-Q Every 3 Days     KLOR-CON M20 20 MEQ tablet TAKE 1/2 TABLET BY MOUTH EVERY DAY AS NEEDED WITH FUROSEMIDE 45 tablet 2   LORazepam (ATIVAN) 0.5 MG tablet TAKE 1 TABLET BY MOUTH AT BEDTIME AS NEEDED FOR ANXIETY 30 tablet 3   pravastatin (PRAVACHOL) 20 MG tablet Take 20 mg by mouth daily.     Probiotic Product (PROBIOTIC PO) Take by mouth daily.     sacubitril-valsartan (ENTRESTO) 97-103 MG Take 1 tablet by mouth 2 (two) times daily. 180 tablet 3   spironolactone (ALDACTONE) 25 MG tablet TAKE 1/2 TABLET BY MOUTH EVERY DAY 45 tablet 3   tamoxifen (NOLVADEX) 20 MG tablet TAKE 1 TABLET BY MOUTH EVERY DAY 90 tablet 3   venlafaxine XR (EFFEXOR-XR) 37.5 MG 24 hr capsule Take 1 capsule (37.5 mg total) by mouth as needed. 90 capsule 3   venlafaxine XR (EFFEXOR-XR) 75 MG 24 hr capsule Take 1 capsule (75 mg total) by mouth daily with breakfast. 90 capsule 3   Current Facility-Administered Medications  Medication Dose Route Frequency Provider Last Rate Last Admin    acetaminophen (TYLENOL) tablet 650 mg  650 mg Oral Q6H PRN Chrzanowski, Jami B, NP         ALLERGIES: Dilaudid [hydromorphone hcl]  Family History  Problem Relation Age of Onset   Hypertension Mother    Hypertension Father    Breast cancer Other 33       paternal grandfather's niece    Social History   Socioeconomic History   Marital status: Married    Spouse name: Not on file   Number of children: Not on file   Years of education: Not on file   Highest education level: Not  on file  Occupational History   Not on file  Tobacco Use   Smoking status: Former    Types: Cigarettes    Quit date: 03/24/2001    Years since quitting: 21.0   Smokeless tobacco: Never  Vaping Use   Vaping Use: Never used  Substance and Sexual Activity   Alcohol use: Not Currently   Drug use: No   Sexual activity: Yes    Partners: Male    Birth control/protection: Surgical    Comment: tubal ligation & 1 tube removed, menarche 47yo, sexual debut 47yo  Other Topics Concern   Not on file  Social History Narrative   Not on file   Social Determinants of Health   Financial Resource Strain: Not on file  Food Insecurity: Not on file  Transportation Needs: No Transportation Needs (12/22/2017)   PRAPARE - Hydrologist (Medical): No    Lack of Transportation (Non-Medical): No  Physical Activity: Not on file  Stress: Not on file  Social Connections: Not on file  Intimate Partner Violence: Not At Risk (12/22/2017)   Humiliation, Afraid, Rape, and Kick questionnaire    Fear of Current or Ex-Partner: No    Emotionally Abused: No    Physically Abused: No    Sexually Abused: No    ROS  Pelvic ultrasound/sonohysterogram  Indications: PMP bleeding, thickened endometrium  Findings:  Uterus 7.75 x 5.27 x 3.78 cm, retroverted  Endometrium 13.09 cm, cystic changes, +vascularity Foreign object along the right cavity wall remains. Essure device noted in right  cornua  Left ovary 2.81 x 1.40 x 1.35 cm  Right ovary 2.83 x 1.29 x 1.36 cm  No free fluid  Sonohysterogram The procedure and risks of the procedure were reviewed with the patient, consent form was signed. A speculum was placed in the vagina and the cervix was cleansed with betadine. The sonohysterogram catheter was inserted into the uterine cavity without difficulty. The speculum was removed and the ultrasound was reinserted. Saline was infused under direct observation with the ultrasound. A large intracavitary defect c/w a polyp was seen as well as a foreign object (suspect the applicator for the essure device). The polyp measured 3.9 x 3.1 cm. The catheter was removed.   Impression:  Normal sized retroverted uterus Thickened endometrium Large endometrial polyp on sonohysterogram Foreign object also seen in the endometrium (suspect applicator for essure device)   PHYSICAL EXAMINATION:    There were no vitals taken for this visit.    General appearance: alert, cooperative and appears stated age  Pelvic: External genitalia:  no lesions              Urethra:  normal appearing urethra with no masses, tenderness or lesions              Bartholins and Skenes: normal                 Vagina: normal appearing vagina with normal color and discharge, no lesions              Cervix: no lesions                Chaperone was present for exam.  1. Postmenopausal bleeding Large endometrial polyp on sonohysterogram  2. Endometrial polyp Discussed hysteroscopy, polypectomy, D&C. ACOG handouts given. Will arrange surgery with Dr Sabra Heck (patient used to work with her).   Cc; [name], NP

## 2022-04-15 ENCOUNTER — Other Ambulatory Visit: Payer: Self-pay | Admitting: *Deleted

## 2022-04-15 ENCOUNTER — Ambulatory Visit (HOSPITAL_BASED_OUTPATIENT_CLINIC_OR_DEPARTMENT_OTHER): Payer: 59 | Admitting: Obstetrics & Gynecology

## 2022-04-15 ENCOUNTER — Encounter (HOSPITAL_BASED_OUTPATIENT_CLINIC_OR_DEPARTMENT_OTHER): Payer: Self-pay | Admitting: Obstetrics & Gynecology

## 2022-04-15 VITALS — BP 126/78 | HR 75 | Ht 62.0 in | Wt 148.2 lb

## 2022-04-15 DIAGNOSIS — N950 Postmenopausal bleeding: Secondary | ICD-10-CM | POA: Diagnosis not present

## 2022-04-15 DIAGNOSIS — E109 Type 1 diabetes mellitus without complications: Secondary | ICD-10-CM | POA: Diagnosis not present

## 2022-04-15 DIAGNOSIS — R935 Abnormal findings on diagnostic imaging of other abdominal regions, including retroperitoneum: Secondary | ICD-10-CM

## 2022-04-15 DIAGNOSIS — N840 Polyp of corpus uteri: Secondary | ICD-10-CM | POA: Diagnosis not present

## 2022-04-15 DIAGNOSIS — Z170 Estrogen receptor positive status [ER+]: Secondary | ICD-10-CM

## 2022-04-15 DIAGNOSIS — C50611 Malignant neoplasm of axillary tail of right female breast: Secondary | ICD-10-CM

## 2022-04-17 ENCOUNTER — Encounter (HOSPITAL_BASED_OUTPATIENT_CLINIC_OR_DEPARTMENT_OTHER): Payer: Self-pay | Admitting: Obstetrics & Gynecology

## 2022-04-17 NOTE — Progress Notes (Signed)
GYNECOLOGY  VISIT  CC:   surgical consultation  HPI: 47 y.o. [id number] Married White or Caucasian female here for consultation regarding PMP bleeding, endometrial polyp and concerns regarding tamoxifen use.  H/o breast cancer s/p bilateral mastectomy in 2019.  Has been on Tamoxifen since that time.  Had chemotherapy induced menopause.  Started having some bleeding in the fall and was seen by [name] at [facility].  Ultrasound was ordered showing 56m endometrium and cystic space s/p endometrial polyp.  Endometrial biopsy was normal 02/28/2022.  Retained essure device was noted as well on the ultrasound.  Hysteroscopic polyp resection was recommended.  She saw [name] consultation on 04/11/2022 but she has stopped doing surgery.  Pt is here to discuss options.  She really would like to consider a hysterectomy and not have to worry any more about tamoxifen risks.  Hysteroscopy and hysterectomy differences discussed as well as recovery.    She has insulin requiring diabetes and is knowledgeable about this.  Typically under good control.  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  More recent echo with 55-60% EF.  Management for this is with [name]    We discussed, also, that she will need to consider if she wants ovaries removed if we proceed with hysterectomy.  She will need to change from tamoxifen to an aromatase inhibitor.  I would suggest she meet with Dr. GLindi Adieabout this to help with decision making as well.  She is aware I will need to reach out to Dr. BHaroldine Lawsabout his recommendations as well.  Feel pt should proceed with decision making so we can proceed with surgical scheduling but with negative endometrial biopsy, do not feel she needs to feel pressure.     Past Medical History:  Diagnosis Date   Cancer ([facility]    Breast cancer - right   [place]) [place]    Constipation    Depression    Genetic testing 05/27/2017    Breast/GYN panel (23 genes) @ [facility] - No pathogenic mutations detected   GERD (gastroesophageal reflux disease)    History of radiation therapy 12/30/17- 02/10/18   Right chest wall/ 50 Gy in 25 fractions, Right SCV, PAB nodes/ 50 Gy in 25 fractions, Right Chest wall boost 10 gy in 5 fractions.    Insulin dependent type 1 diabetes mellitus (HCC)    Insulin pump in place    PONV (postoperative nausea and vomiting)    Stenosing tenosynovitis of thumb 06/2014   right   Thyroid goiter     MEDS:   Current Outpatient Medications on File Prior to Visit  Medication Sig Dispense Refill   amphetamine-dextroamphetamine (ADDERALL XR) 20 MG 24 hr capsule Take 20 mg by mouth every morning.     carvedilol (COREG) 6.25 MG tablet TAKE 1 TABLET BY MOUTH TWICE DAILY WITH A MEAL 180 tablet 3   Continuous Blood Gluc Sensor (DEXCOM G6 SENSOR) MISC Apply topically every 3 (three) days.     CONTOUR TEST test strip USE TO TEST BLOOD SUGARS 6 TIMES DAILY ON PUMP DX  E10.9     furosemide (LASIX) 40 MG tablet TAKE 1 TABLET BY MOUTH EVERY DAY AS NEEDED FOR FLUID/EDEMA 90 tablet 3   HUMALOG 100 UNIT/ML injection Inject into the skin.     Insulin Disposable Pump (OMNIPOD 5 G6 POD, GEN 5,) MISC SMARTSIG:SUB-Q Every 3 Days     KLOR-CON M20 20 MEQ tablet TAKE 1/2 TABLET BY MOUTH EVERY  DAY AS NEEDED WITH FUROSEMIDE 45 tablet 2   LORazepam (ATIVAN) 0.5 MG tablet TAKE 1 TABLET BY MOUTH AT BEDTIME AS NEEDED FOR ANXIETY 30 tablet 3   pravastatin (PRAVACHOL) 20 MG tablet Take 20 mg by mouth daily.     Probiotic Product (PROBIOTIC PO) Take by mouth daily.     sacubitril-valsartan (ENTRESTO) 97-103 MG Take 1 tablet by mouth 2 (two) times daily. 180 tablet 3   spironolactone (ALDACTONE) 25 MG tablet TAKE 1/2 TABLET BY MOUTH EVERY DAY 45 tablet 3   venlafaxine XR (EFFEXOR-XR) 37.5 MG 24 hr capsule Take 1 capsule (37.5 mg total) by mouth as needed. 90 capsule 3   venlafaxine XR (EFFEXOR-XR) 75 MG 24 hr capsule Take 1 capsule (75  mg total) by mouth daily with breakfast. 90 capsule 3   Current Facility-Administered Medications on File Prior to Visit  Medication Dose Route Frequency Provider Last Rate Last Admin   acetaminophen (TYLENOL) tablet 650 mg  650 mg Oral Q6H PRN Chrzanowski, Jami B, NP        ALLERGIES: Dilaudid [hydromorphone hcl]  SH:  married, non smoker  Review of Systems  Constitutional: Negative.   Genitourinary:        Irregular spotting    PHYSICAL EXAMINATION:    BP 126/78 (BP Location: Left Arm, Patient Position: Sitting, Cuff Size: Normal)   Pulse 75   Ht '5\' 2"'$  (1.575 m) Comment: Reported  Wt 148 lb 3.2 oz (67.2 kg)   BMI 27.11 kg/m     General appearance: alert, cooperative and appears stated age No exam performed today  Assessment/Plan: 1. Postmenopausal bleeding - will proceed with schedling once we have communicated with [name] and Dr. Lindi Adie  2. Endometrial polyp  3. Abnormal ultrasound of endometrium  4. Controlled diabetes mellitus type 1 without complications ([place]) - would like hba1c to be <8 for scheduling  5. Malignant neoplasm of axillary tail of right breast in female, estrogen receptor positive ([place]) - currently on tamoxifen  Total time with pt 35 minutes

## 2022-04-19 ENCOUNTER — Ambulatory Visit: Payer: 59 | Admitting: Hematology and Oncology

## 2022-04-24 NOTE — Assessment & Plan Note (Signed)
06/05/2017:Bilateral mastectomies: Right mastectomy: IDC grade 1, 3 foci 1.2 cm, 1.2 cm, 1 cm, IgA DCIS, LVH diffuse present, margins negative, 4/7 lymph nodes positive with extracapsular extension, ER 95%, PR 95%, HER-2 negative, Ki-67 2% to 10%, T1 cN2 aM0 stage II a pathological stage; left mastectomy: Benign   Mammaprint done preoperatively: Low risk Axillary lymph node dissection 06/26/2017: 2/22 lymph nodes positive (making a total of 6 lymph nodes that were positive)   Treatment plan: 1.  Because of  high risk features (6 LN Pos), we recommended adjuvant systemic chemotherapy with dose dense Adriamycin and Cytoxan x4 followed by Taxol weekly x12  07/22/2017-12/09/2017 3. Followed by radiation.  Started 12/30/2017 4. Followed by adjuvant antiestrogen therapy with tamoxifen started 03/25/2018   UPBEAT clinical trial ([id number]): No toxicities related to the trial --------------------------------------------------------------------------- Acute congestive heart failure secondary to Adriamycin: Hospitalization 01/27/2018 to 02/01/2018: EF 25% Follows with Dr. Haroldine Laws Currently on Entresto and Lasix.   Tamoxifen toxicities: 1.  Mood swings: Increase the dosage of Effexor to 117.5. 2.  Hot flashes     Breast cancer surveillance: 1.  Breast exam 04/25/2022: Benign bilateral mastectomy scar tissue no palpable lumps or nodules. 2.  Mammograms: Not indicated because she had bilateral mastectomies   Patient has picked up flyfishing and appears to be having a good time with her. She is also playing virtual reality games for exercise/activity.  She enjoys musical concerts along with her husband.   Return to clinic in 1 year for follow-up

## 2022-04-25 ENCOUNTER — Inpatient Hospital Stay: Payer: 59 | Attending: Hematology and Oncology | Admitting: Hematology and Oncology

## 2022-04-25 VITALS — BP 123/71 | HR 91 | Temp 97.6°F | Resp 18 | Wt 145.4 lb

## 2022-04-25 DIAGNOSIS — Z9013 Acquired absence of bilateral breasts and nipples: Secondary | ICD-10-CM | POA: Insufficient documentation

## 2022-04-25 DIAGNOSIS — C50611 Malignant neoplasm of axillary tail of right female breast: Secondary | ICD-10-CM | POA: Diagnosis not present

## 2022-04-25 DIAGNOSIS — Z9221 Personal history of antineoplastic chemotherapy: Secondary | ICD-10-CM | POA: Diagnosis not present

## 2022-04-25 DIAGNOSIS — Z79899 Other long term (current) drug therapy: Secondary | ICD-10-CM | POA: Insufficient documentation

## 2022-04-25 DIAGNOSIS — C773 Secondary and unspecified malignant neoplasm of axilla and upper limb lymph nodes: Secondary | ICD-10-CM | POA: Insufficient documentation

## 2022-04-25 DIAGNOSIS — Z923 Personal history of irradiation: Secondary | ICD-10-CM | POA: Diagnosis not present

## 2022-04-25 DIAGNOSIS — Z170 Estrogen receptor positive status [ER+]: Secondary | ICD-10-CM | POA: Diagnosis not present

## 2022-04-25 NOTE — Progress Notes (Signed)
Patient Care Team: Tisovec, Fransico Him, MD as PCP - General (Internal Medicine) Bensimhon, Shaune Pascal, MD as PCP - Cardiology (Cardiology) Eppie Gibson, MD as Attending Physician (Radiation Oncology) Nicholas Lose, MD as Consulting Physician (Hematology and Oncology) [name], MD as Consulting Physician (General Surgery) Salvadore Dom, MD as Consulting Physician (Obstetrics and Gynecology)  DIAGNOSIS:  Encounter Diagnosis  Name Primary?   Malignant neoplasm of axillary tail of right breast in female, estrogen receptor positive ([place]) Yes    SUMMARY OF ONCOLOGIC HISTORY: Oncology History  Malignant neoplasm of axillary tail of right breast in female, estrogen receptor positive ([place])  05/09/2017 Initial Diagnosis   May 2018: Palpable right axillary mass.  The recommended a 6-week follow-up but apparently the mass decreased in size.  Recent increase in the mass was noted, biopsy revealed Grade 1 IDC with DCIS, lymphovascular invasion present, 4 small hypoechoic masses largest 0.9 cm, T1BN1 stage Ib clinical stage   06/05/2017 Surgery   Bilateral mastectomies: Right mastectomy: IDC grade 1, 3 foci 1.2 cm, 1.2 cm, 1 cm, IgA DCIS, LVH diffuse present, margins negative, 4/7 lymph nodes positive with extracapsular extension, ER 95%, PR 95%, HER-2 negative, Ki-67 2% to 10%, T1 cN2 aM0 stage II a pathological stage; left mastectomy: Benign   06/26/2017 Surgery   Axillary lymph node dissection: 2/22 lymph nodes positive (making a total of 6+ lymph nodes)   07/22/2017 - 12/09/2017 Chemotherapy   Adjuvant chemotherapy with dose dense Adriamycin and Cytoxan x4 followed by Taxol weekly x12    12/23/2017 Cancer Staging   Staging form: Breast, AJCC 8th Edition - Pathologic: Stage IB (pT1c, pN2a, cM0, G1, ER+, PR+, HER2-) - Signed by Eppie Gibson, MD on 12/23/2017   12/30/2017 - 02/09/2018 Radiation Therapy   Adjuvant radiation   01/12/2018 Imaging   No PE, patchy consolidation in both lungs  groundglass opacities, differential infection versus radiation pneumonitis, right pleural effusion moderate to large, left pleural effusion moderate   01/27/2018 - 02/01/2018 Hospital Admission   Hospital admission for congestive heart failure with pleural effusion, EF 25% suspected to be related to Adriamycin   03/25/2018 -  Anti-estrogen oral therapy   Tamoxifen daily   Breast cancer metastasized to axillary lymph node, right ([place])  06/05/2017 Initial Diagnosis   Breast cancer metastasized to axillary lymph node, right ([place])   07/22/2017 -  Chemotherapy   Adjuvant chemotherapy with dose dense Adriamycin and Cytoxan x4 followed by Taxol weekly x12      CHIEF COMPLIANT: Surveillance of breast cancer    INTERVAL HISTORY: Stephanie Holden is a 47 year old with above-mentioned history of bilateral mastectomies for right breast cancer. She finished adjuvant chemotherapy and radiation and is currently on surveillance and tamoxifen. She presents to the clinic for a follow-up. She reports that she has lost at least 50 pounds.  She started the [employer] program.  She states that she stopped the tamoxifen she had some moderate hot flashes.    ALLERGIES:  is allergic to dilaudid [hydromorphone hcl].  MEDICATIONS:  Current Outpatient Medications  Medication Sig Dispense Refill   amphetamine-dextroamphetamine (ADDERALL XR) 20 MG 24 hr capsule Take 20 mg by mouth every morning.     carvedilol (COREG) 6.25 MG tablet TAKE 1 TABLET BY MOUTH TWICE DAILY WITH A MEAL 180 tablet 3   Continuous Blood Gluc Sensor (DEXCOM G6 SENSOR) MISC Apply topically every 3 (three) days.     CONTOUR TEST test strip USE TO TEST BLOOD SUGARS 6 TIMES DAILY ON PUMP  DX  E10.9     furosemide (LASIX) 40 MG tablet TAKE 1 TABLET BY MOUTH EVERY DAY AS NEEDED FOR FLUID/EDEMA 90 tablet 3   HUMALOG 100 UNIT/ML injection Inject into the skin.     Insulin Disposable Pump (OMNIPOD 5 G6 POD, GEN 5,) MISC SMARTSIG:SUB-Q Every 3 Days      KLOR-CON M20 20 MEQ tablet TAKE 1/2 TABLET BY MOUTH EVERY DAY AS NEEDED WITH FUROSEMIDE 45 tablet 2   LORazepam (ATIVAN) 0.5 MG tablet TAKE 1 TABLET BY MOUTH AT BEDTIME AS NEEDED FOR ANXIETY 30 tablet 3   pravastatin (PRAVACHOL) 20 MG tablet Take 20 mg by mouth daily.     Probiotic Product (PROBIOTIC PO) Take by mouth daily.     sacubitril-valsartan (ENTRESTO) 97-103 MG Take 1 tablet by mouth 2 (two) times daily. 180 tablet 3   spironolactone (ALDACTONE) 25 MG tablet TAKE 1/2 TABLET BY MOUTH EVERY DAY 45 tablet 3   Current Facility-Administered Medications  Medication Dose Route Frequency Provider Last Rate Last Admin   acetaminophen (TYLENOL) tablet 650 mg  650 mg Oral Q6H PRN Chrzanowski, Jami B, NP        PHYSICAL EXAMINATION: ECOG PERFORMANCE STATUS: 1 - Symptomatic but completely ambulatory  Vitals:   04/25/22 0922  BP: 123/71  Pulse: 91  Resp: 18  Temp: 97.6 F (36.4 C)  SpO2: 100%   Filed Weights   04/25/22 0922  Weight: 145 lb 6 oz (65.9 kg)    BREAST: No palpable lumps to bilateral chest wall axilla. (exam performed in the presence of a chaperone)  LABORATORY DATA:  I have reviewed the data as listed    Latest Ref Rng & Units 09/30/2019   12:04 PM 07/12/2019    9:00 AM 03/15/2019   11:23 AM  CMP  Glucose 70 - 99 mg/dL 216  166  202   BUN 6 - 20 mg/dL '13  15  12   '$ Creatinine 0.44 - 1.00 mg/dL 0.78  0.82  0.87   Sodium 135 - 145 mmol/L 136  141  142   Potassium 3.5 - 5.1 mmol/L 4.9  4.1  3.9   Chloride 98 - 111 mmol/L 102  107  106   CO2 22 - 32 mmol/L '27  25  27   '$ Calcium 8.9 - 10.3 mg/dL 9.0  8.3  8.9   Total Protein 6.5 - 8.1 g/dL 6.7  6.7    Total Bilirubin 0.3 - 1.2 mg/dL 1.3  0.4    Alkaline Phos 38 - 126 U/L 72  93    AST 15 - 41 U/L 19  14    ALT 0 - 44 U/L 16  15      Lab Results  Component Value Date   WBC 5.4 09/30/2019   HGB 14.3 09/30/2019   HCT 44.2 09/30/2019   MCV 92.1 09/30/2019   PLT 258 09/30/2019   NEUTROABS 2.8 07/12/2019     ASSESSMENT & PLAN:  Malignant neoplasm of axillary tail of right breast in female, estrogen receptor positive ([place]) 06/05/2017:Bilateral mastectomies: Right mastectomy: IDC grade 1, 3 foci 1.2 cm, 1.2 cm, 1 cm, IgA DCIS, LVH diffuse present, margins negative, 4/7 lymph nodes positive with extracapsular extension, ER 95%, PR 95%, HER-2 negative, Ki-67 2% to 10%, T1 cN2 aM0 stage II a pathological stage; left mastectomy: Benign   Mammaprint done preoperatively: Low risk Axillary lymph node dissection 06/26/2017: 2/22 lymph nodes positive (making a total of 6 lymph nodes that were positive)  Treatment plan: 1.  Because of  high risk features (6 LN Pos), we recommended adjuvant systemic chemotherapy with dose dense Adriamycin and Cytoxan x4 followed by Taxol weekly x12  07/22/2017-12/09/2017 3. Followed by radiation.  Started 12/30/2017 4. Followed by adjuvant antiestrogen therapy with tamoxifen started 03/25/2018   UPBEAT clinical trial (WF [id number]): No toxicities related to the trial --------------------------------------------------------------------------- Acute congestive heart failure secondary to Adriamycin: Hospitalization 01/27/2018 to 02/01/2018: EF 25% Follows with Dr. Haroldine Laws 12/21/2021: EF 55 to 60% Currently on Entresto and Lasix.   Tamoxifen toxicities: She stopped tamoxifen because it was causing endometrial hypertrophy and polyp. She is hoping to undergo hysterectomy and bilateral salpingo-oophorectomy and then go on antiestrogen therapy with anastrozole.  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   Breast cancer surveillance: 1.  Breast exam 04/25/2022: Benign bilateral mastectomy scar tissue no palpable lumps or nodules. 2.  Mammograms: Not indicated because she had bilateral mastectomies   Patient has picked up [place]  She is also playing virtual reality games for exercise/activity.  She enjoys musical  concerts along with her husband. She has lost 50 pounds by doing [place]  Return to clinic in 1 year for follow-up    No orders of the defined types were placed in this encounter.  The patient has a good understanding of the overall plan. she agrees with it. she will call with any problems that may develop before the next visit here. Total time spent: 30 mins including face to face time and time spent for planning, charting and co-ordination of care   Harriette Ohara, MD 04/25/22    I Gardiner Coins am acting as a Education administrator for [employer]  I have reviewed the above documentation for accuracy and completeness, and I agree with the above.

## 2022-04-30 ENCOUNTER — Encounter (HOSPITAL_BASED_OUTPATIENT_CLINIC_OR_DEPARTMENT_OTHER): Payer: Self-pay | Admitting: Obstetrics & Gynecology

## 2022-05-02 ENCOUNTER — Encounter (HOSPITAL_COMMUNITY): Payer: Self-pay | Admitting: Internal Medicine

## 2022-05-03 ENCOUNTER — Encounter (HOSPITAL_BASED_OUTPATIENT_CLINIC_OR_DEPARTMENT_OTHER): Payer: Self-pay | Admitting: Obstetrics & Gynecology

## 2022-05-13 ENCOUNTER — Encounter: Payer: Self-pay | Admitting: *Deleted

## 2022-05-16 ENCOUNTER — Encounter (HOSPITAL_BASED_OUTPATIENT_CLINIC_OR_DEPARTMENT_OTHER): Payer: Self-pay

## 2022-05-17 NOTE — Progress Notes (Signed)
I have called Dr. Clayborne Dana office and spoken with nursing staff as well as communicated through [place] message within [facility] and also sent a formal letter for cardiac clearance.  I requested a note be placed in EPIC.  This was the communication back from Dr. Haroldine Laws regarding this pt.  This was received in 05/10/2022.   Bensimhon, Shaune Pascal, MD  Megan Salon, MD EF has normalized on last echo 9/23  She is at low risk for peri-op cardiac complications with surgery. Can proceed without further cardaic testing.  Thanks for helping her.

## 2022-05-21 ENCOUNTER — Encounter (HOSPITAL_BASED_OUTPATIENT_CLINIC_OR_DEPARTMENT_OTHER): Payer: Self-pay

## 2022-05-30 ENCOUNTER — Other Ambulatory Visit: Payer: Self-pay | Admitting: Hematology and Oncology

## 2022-06-16 ENCOUNTER — Other Ambulatory Visit: Payer: Self-pay | Admitting: Hematology and Oncology

## 2022-06-17 ENCOUNTER — Encounter: Payer: Self-pay | Admitting: Hematology and Oncology

## 2022-06-25 ENCOUNTER — Encounter (HOSPITAL_BASED_OUTPATIENT_CLINIC_OR_DEPARTMENT_OTHER): Payer: Self-pay | Admitting: Obstetrics & Gynecology

## 2022-06-25 ENCOUNTER — Ambulatory Visit (INDEPENDENT_AMBULATORY_CARE_PROVIDER_SITE_OTHER): Payer: 59 | Admitting: Obstetrics & Gynecology

## 2022-06-25 VITALS — BP 130/80 | HR 86 | Ht 62.0 in | Wt 147.0 lb

## 2022-06-25 DIAGNOSIS — C50611 Malignant neoplasm of axillary tail of right female breast: Secondary | ICD-10-CM

## 2022-06-25 DIAGNOSIS — E109 Type 1 diabetes mellitus without complications: Secondary | ICD-10-CM

## 2022-06-25 DIAGNOSIS — Z98891 History of uterine scar from previous surgery: Secondary | ICD-10-CM

## 2022-06-25 DIAGNOSIS — R9389 Abnormal findings on diagnostic imaging of other specified body structures: Secondary | ICD-10-CM | POA: Diagnosis not present

## 2022-06-25 DIAGNOSIS — N950 Postmenopausal bleeding: Secondary | ICD-10-CM | POA: Diagnosis not present

## 2022-06-25 DIAGNOSIS — Z170 Estrogen receptor positive status [ER+]: Secondary | ICD-10-CM

## 2022-06-27 ENCOUNTER — Encounter (HOSPITAL_BASED_OUTPATIENT_CLINIC_OR_DEPARTMENT_OTHER): Payer: Self-pay | Admitting: Obstetrics & Gynecology

## 2022-07-08 ENCOUNTER — Other Ambulatory Visit (HOSPITAL_BASED_OUTPATIENT_CLINIC_OR_DEPARTMENT_OTHER): Payer: Self-pay | Admitting: Obstetrics & Gynecology

## 2022-07-09 ENCOUNTER — Encounter (HOSPITAL_BASED_OUTPATIENT_CLINIC_OR_DEPARTMENT_OTHER): Payer: Self-pay | Admitting: Obstetrics & Gynecology

## 2022-07-09 ENCOUNTER — Other Ambulatory Visit: Payer: Self-pay

## 2022-07-09 NOTE — Progress Notes (Addendum)
Your procedure is scheduled on Wednesday, 07/17/2022.  Report to [facility] AT  5:30 AM.   Call this number if you have problems the morning of surgery  [phone].   OUR ADDRESS IS [address].  [facility] LOCATED [facility].  PLEASE BRING YOUR INSURANCE CARD AND PHOTO ID DAY OF SURGERY.  ONLY 2 PEOPLE ARE ALLOWED IN  WAITING  ROOM                                      REMEMBER:  DO NOT EAT FOOD, CANDY GUM OR MINTS  AFTER MIDNIGHT THE NIGHT BEFORE YOUR SURGERY . YOU MAY HAVE CLEAR LIQUIDS FROM MIDNIGHT THE NIGHT BEFORE YOUR SURGERY UNTIL  4:30 AM. NO CLEAR LIQUIDS AFTER   4:30 AM DAY OF SURGERY.  YOU MAY  BRUSH YOUR TEETH MORNING OF SURGERY AND RINSE YOUR MOUTH OUT, NO CHEWING GUM CANDY OR MINTS.     CLEAR LIQUID DIET    Allowed      Water                                                                   Coffee and tea, regular and decaf  (NO cream or milk products of any type, may sweeten)                         Carbonated beverages, regular and diet                                    Sports drinks like Gatorade _____________________________________________________________________     TAKE ONLY THESE MEDICATIONS MORNING OF SURGERY: Coreg, Pravastatin, Effexor Hold insulin the morning of surgery. Contact endocrinologist for instructions on insulin pump.    UP TO 4 VISITORS  MAY VISIT IN THE EXTENDED RECOVERY ROOM UNTIL 800 PM ONLY.  ONE  VISITOR AGE 59 AND OVER MAY SPEND THE NIGHT AND MUST BE IN EXTENDED RECOVERY ROOM NO LATER THAN 800 PM . YOUR DISCHARGE TIME AFTER YOU SPEND THE NIGHT IS 900 AM THE MORNING AFTER YOUR SURGERY.  YOU MAY PACK A SMALL OVERNIGHT BAG WITH TOILETRIES FOR YOUR OVERNIGHT STAY IF YOU WISH.  YOUR PRESCRIPTION MEDICATIONS WILL BE PROVIDED DURING [facility] STAY.                                      DO NOT WEAR JEWERLY/  METAL/  PIERCINGS (INCLUDING NO PLASTIC PIERCINGS) DO NOT WEAR LOTIONS,  POWDERS, PERFUMES OR NAIL POLISH ON YOUR FINGERNAILS. TOENAIL POLISH IS OK TO WEAR. DO NOT SHAVE FOR 48 HOURS PRIOR TO DAY OF SURGERY.  CONTACTS, GLASSES, OR DENTURES MAY NOT BE WORN TO SURGERY.  REMEMBER: NO SMOKING, VAPING ,  DRUGS OR ALCOHOL FOR 24 HOURS BEFORE YOUR SURGERY.  [facility] IS NOT RESPONSIBLE  FOR ANY BELONGINGS.                                                                    Marland Kitchen           [facility] - Preparing for Surgery Before surgery, you can play an important role.  Because skin is not sterile, your skin needs to be as free of germs as possible.  You can reduce the number of germs on your skin by washing with CHG (chlorahexidine gluconate) soap before surgery.  CHG is an antiseptic cleaner which kills germs and bonds with the skin to continue killing germs even after washing. Please DO NOT use if you have an allergy to CHG or antibacterial soaps.  If your skin becomes reddened/irritated stop using the CHG and inform your nurse when you arrive at Short Stay. Do not shave (including legs and underarms) for at least 48 hours prior to the first CHG shower.  You may shave your face/neck. Please follow these instructions carefully:  1.  Shower with CHG Soap the night before surgery and the  morning of Surgery.  2.  If you choose to wash your hair, wash your hair first as usual with your  normal  shampoo.  3.  After you shampoo, rinse your hair and body thoroughly to remove the  shampoo.                                        4.  Use CHG as you would any other liquid soap.  You can apply chg directly  to the skin and wash , chg soap provided, night before and morning of your surgery.  5.  Apply the CHG Soap to your body ONLY FROM THE NECK DOWN.   Do not use on face/ open                           Wound or open sores. Avoid contact with eyes, ears mouth and genitals (private parts).                       Wash face,  Genitals (private parts) with  your normal soap.             6.  Wash thoroughly, paying special attention to the area where your surgery  will be performed.  7.  Thoroughly rinse your body with warm water from the neck down.  8.  DO NOT shower/wash with your normal soap after using and rinsing off  the CHG Soap.             9.  Pat yourself dry with a clean towel.            10.  Wear clean pajamas.            11.  Place clean sheets on your bed the night of your first shower and do not  sleep with pets. Day of Surgery : Do not apply any lotions/ powders the morning of surgery.  Please wear clean clothes to the hospital/surgery  center.  IF YOU HAVE ANY SKIN IRRITATION OR PROBLEMS WITH THE SURGICAL SOAP, PLEASE GET A BAR OF GOLD DIAL SOAP AND SHOWER THE NIGHT BEFORE YOUR SURGERY AND THE MORNING OF YOUR SURGERY. PLEASE LET THE NURSE KNOW MORNING OF YOUR SURGERY IF YOU HAD ANY PROBLEMS WITH THE SURGICAL SOAP.   ________________________________________________________________________                                                        QUESTIONS [place] [facility] PRE OP NURSE PHONE [phone].

## 2022-07-09 NOTE — Progress Notes (Signed)
Spoke w/ via phone for pre-op interview---Stephanie Holden needs dos---- urine pregnancy per anesthesia, surgeon orders pending as of 07/09/22              Holden results------07/15/2022 Holden appt for cbc, type & screen, bmp, 12/21/21 EKG in chart & epic, 12/21/21 echocardiogram in Epic EF 55-60% COVID test -----patient states asymptomatic no test needed Arrive at -------0530 on Wednesday, 07/17/2022 NPO after MN NO Solid Food.  Clear liquids from MN until---0430 Med rec completed Medications to take morning of surgery -----Coreg, Pravastatin, Effexor Diabetic medication -----Hold insulin morning of surgery. Contact endocrinologist concerning instructions for insulin pump. Patient states blood glucose sensor and insulin pump will be on her right thigh on the day of surgery. Patient instructed no nail polish to be worn day of surgery Patient instructed to bring photo id and insurance card day of surgery Patient aware to have Driver (ride ) / caregiver    for 24 hours after surgery - husband, Stephanie Holden Patient Special Instructions -----Extended / overnight stay instructions given. Pre-Op special Istructions -----Requested orders from Dr. Hyacinth Meeker via Epic IB on 07/09/22. Patient verbalized understanding of instructions that were given at this phone interview. Patient denies shortness of breath, chest pain, fever, cough at this phone interview.

## 2022-07-11 NOTE — Progress Notes (Signed)
47 y.o. Stephanie Holden Married White or Caucasian female here for discussion of upcoming procedure.  TLH/BSO/cystoscopy planned due to postmenopausal bleeding, .  Pre-op evaluation thus far has included ultrasound on 04/19/2022 showing uterus 7.7 x 5 x 4cm with 13mm endometrium with vascularity.  Probable essure device within endometrium or along uterine wall present at well.  Endoemtrial biopsy was negative 02/28/2022.  Initially, hysteroscopy with resection of endometrial mass was discussed.  Pt has really considered options and would like hysterectomy.  She has hx of breast cancer and is on Tamoxifen.  Aware of endometrial cancer risk with this medication.  Also, she really would like ovaries removed as well.  She has discussed this with Dr. Pamelia Hoit, her oncologist, and he is supportive of this as well.    Pt is insulin requiring diabetic.  Most recent hba1c was 7.1.  She is very knowledge about her pump.  Also uses omnipod to monitor blood sugars.  We discussed where to place pod and pump prior to surgery as she usually has these on her abdomen.    Has hx of cardiac changes with chemotherapy.  Last echocardiogram was 9/23 and was normal.  Cardiologist is Dr. Gala Romney who states: She is at low risk for peri-op cardiac complications with surgery. Can proceed without further cardaic testing.  Procedure discussed with patient.  Hospital stay, recovery and pain management all discussed.  Risks discussed including but not limited to bleeding, 1% risk of receiving a  transfusion, infection, 3-4% risk of bowel/bladder/ureteral/vascular injury discussed as well as possible need for additional surgery if injury does occur discussed.  DVT/PE and rare risk of death discussed.  My actual complications with prior surgeries discussed.  Vaginal cuff dehiscence discussed.  Hernia formation discussed.  Positioning and incision locations discussed.  Patient aware if pathology abnormal she may need additional treatment.  All questions  answered.     Ob Hx:   No LMP recorded. (Menstrual status: Chemotherapy).          Sexually active: Yes.   Birth control: PMP since chemotherapy Last pap: 02/06/2022 neg with neg HR HPV Last MMG: bilateral mastectomy Smoking: No   Past Surgical History:  Procedure Laterality Date   AXILLARY LYMPH NODE DISSECTION Right 06/26/2017   Procedure: AXILLARY LYMPH NODE DISSECTION;  Surgeon: Almond Lint, MD;  Location: [facility] OR;  Service: General;  Laterality: Right;   CARPAL TUNNEL RELEASE Bilateral 10/28/2013   Procedure: BILATERAL CARPAL TUNNEL RELEASE;  Surgeon: Cindee Salt, MD;  Location: [facility];  Service: Orthopedics;  Laterality: Bilateral;   CESAREAN SECTION  2000; 08/20/2001   DILATION AND CURETTAGE OF UTERUS  1998   ENDOMETRIAL BIOPSY  02/28/2022   benign   ESSURE TUBAL LIGATION  2005   failed, tubal puncture   LAPAROSCOPIC TUBAL LIGATION  04/02/2007   removal Essure   LAPAROSCOPIC UNILATERAL SALPINGECTOMY  2009   MASS EXCISION Left 03/11/2018   Procedure: EXCISION LEFT CHEST WALL MASS;  Surgeon: Almond Lint, MD;  Location: [facility] OR;  Service: General;  Laterality: Left;   MASTECTOMY W/ SENTINEL NODE BIOPSY Bilateral 06/05/2017   Procedure: BILATERAL MASTECTOMIES  WITH RIGHT SENTINEL LYMPH NODE BIOPSY;  Surgeon: Almond Lint, MD;  Location: [facility];  Service: General;  Laterality: Bilateral;   PORT-A-CATH REMOVAL N/A 03/11/2018   Procedure: REMOVAL PORT-A-CATH;  Surgeon: Almond Lint, MD;  Location: [facility];  Service: General;  Laterality: N/A;   PORTACATH PLACEMENT Left 06/26/2017   Procedure: INSERTION PORT-A-CATH;  Surgeon: Almond Lint, MD;  Location: [facility];  Service: General;  Laterality: Left;   TRIGGER FINGER RELEASE Left 04/12/2014   Procedure: RELEASE A-1 PULLEY LEFT THUMB;  Surgeon: Cindee Salt, MD;  Location: [facility];  Service: Orthopedics;  Laterality: Left;   TRIGGER FINGER RELEASE Right 07/19/2014   Procedure: RELEASE TRIGGER FINGER/A-1  PULLEY RIGHT THUMB;  Surgeon: Cindee Salt, MD;  Location: [facility];  Service: Orthopedics;  Laterality: Right;    Past Medical History:  Diagnosis Date   Cancer    Breast cancer - right   CHF (congestive heart failure) 2020   chemo induced cardiomyopathy, 12/21/21 echocardiogram EF 55-60%   Constipation    resolved 07/09/22 per pt   Depression    Follows w/ Dr. Pamelia Hoit, oncologist.   Genetic testing 05/27/2017   Breast/GYN panel (23 genes) @ [facility] - No pathogenic mutations detected   GERD (gastroesophageal reflux disease)    resolved per patient as of 07/09/2022   History of chemotherapy 2019   Follows w/ Dr. Pamelia Hoit @ [facility].   History of radiation therapy 12/30/17- 02/10/18   Right chest wall/ 50 Gy in 25 fractions, Right SCV, PAB nodes/ 50 Gy in 25 fractions, Right Chest wall boost 10 gy in 5 fractions.    Insulin dependent type 1 diabetes mellitus    Follows w/ Dr. Darral Dash, endocronologist.   Insulin pump in place    Pleural effusion 01/13/2018   Pneumonia 01/12/2018   PONV (postoperative nausea and vomiting)    Stenosing tenosynovitis of thumb 06/2014   right   Thyroid goiter    Wears contact lenses    Wears glasses     Allergies: Dilaudid [hydromorphone hcl]  Current Outpatient Medications  Medication Sig Dispense Refill   amphetamine-dextroamphetamine (ADDERALL XR) 20 MG 24 hr capsule Take 20 mg by mouth every morning.     carvedilol (COREG) 6.25 MG tablet TAKE 1 TABLET BY MOUTH TWICE DAILY WITH A MEAL 180 tablet 3   Continuous Blood Gluc Sensor (DEXCOM G6 SENSOR) MISC Apply topically every 3 (three) days.     CONTOUR TEST test strip USE TO TEST BLOOD SUGARS 6 TIMES DAILY ON PUMP DX  E10.9     furosemide (LASIX) 40 MG tablet TAKE 1 TABLET BY MOUTH EVERY DAY AS NEEDED FOR FLUID/EDEMA 90 tablet 3   HUMALOG 100 UNIT/ML injection Inject into the skin. Via Insulin pump / average 36 units per day per pt     Insulin Disposable Pump (OMNIPOD  5 G6 POD, GEN 5,) MISC SMARTSIG:SUB-Q Every 3 Days     KLOR-CON M20 20 MEQ tablet TAKE 1/2 TABLET BY MOUTH EVERY DAY AS NEEDED WITH FUROSEMIDE 45 tablet 2   LORazepam (ATIVAN) 0.5 MG tablet TAKE 1 TABLET BY MOUTH AT BEDTIME AS NEEDED FOR ANXIETY 30 tablet 3   pravastatin (PRAVACHOL) 20 MG tablet Take 20 mg by mouth daily.     Probiotic Product (PROBIOTIC PO) Take by mouth daily.     sacubitril-valsartan (ENTRESTO) 97-103 MG Take 1 tablet by mouth 2 (two) times daily. 180 tablet 3   spironolactone (ALDACTONE) 25 MG tablet TAKE 1/2 TABLET BY MOUTH EVERY DAY (Patient taking differently: daily.) 45 tablet 3   venlafaxine XR (EFFEXOR-XR) 75 MG 24 hr capsule TAKE 1 CAPSULE BY MOUTH DAILY WITH BREAKFAST (Patient taking differently: Take 75 mg by mouth daily with breakfast. Takes 37.5 mg daily) 90 capsule 3   Multiple Vitamin (MULTIVITAMIN WITH MINERALS) TABS tablet Take 1 tablet by mouth daily.  Probiotic Product (PROBIOTIC BLEND PO) Take by mouth.     Current Facility-Administered Medications  Medication Dose Route Frequency Provider Last Rate Last Admin   acetaminophen (TYLENOL) tablet 650 mg  650 mg Oral Q6H PRN Chrzanowski, Jami B, NP        ROS: Pertinent items noted in HPI and remainder of comprehensive ROS otherwise negative.  Exam:   BP 130/80 (BP Location: Left Arm, Patient Position: Sitting, Cuff Size: Normal)   Pulse 86   Ht  (1.575 m)   Wt 147 lb (66.7 kg)   SpO2 99%   BMI 26.89 kg/m   General appearance: alert and cooperative Head: Normocephalic, without obvious abnormality, atraumatic Neck: no adenopathy, supple, symmetrical, trachea midline and thyroid not enlarged, symmetric, no tenderness/mass/nodules Lungs: clear to auscultation bilaterally Heart: regular rate and rhythm, S1, S2 normal, no murmur, click, rub or gallop Abdomen: soft, non-tender; bowel sounds normal; no masses,  no organomegaly Extremities: extremities normal, atraumatic, no cyanosis or edema Skin:  Skin color, texture, turgor normal. No rashes or lesions Lymph nodes: Cervical, supraclavicular, and axillary nodes normal. no inguinal nodes palpated Neurologic: Grossly normal  Pelvic: deferred   Assessment/Plan: 1. Postmenopausal bleeding - pt has decided to proceed with definitive surgery with TLH/BSO/cystoscopy. - pre and post op instructions reviewed - post op pain medications reviewed - Medications/Vitamins reviewed.  She is going to stop her Tamoxifen prior to surgery to decrease DVT risk.   2. Thickened endometrium  3. History of cesarean section  4. Malignant neoplasm of axillary tail of right breast in female, estrogen receptor positive  5. Controlled diabetes mellitus type 1 without complications

## 2022-07-12 DIAGNOSIS — I429 Cardiomyopathy, unspecified: Secondary | ICD-10-CM | POA: Insufficient documentation

## 2022-07-15 ENCOUNTER — Encounter (HOSPITAL_COMMUNITY)
Admission: RE | Admit: 2022-07-15 | Discharge: 2022-07-15 | Disposition: A | Discharge status: Home / Self Care | Payer: 59 | Source: Ambulatory Visit | Attending: Obstetrics & Gynecology | Admitting: Obstetrics & Gynecology

## 2022-07-15 ENCOUNTER — Encounter (HOSPITAL_BASED_OUTPATIENT_CLINIC_OR_DEPARTMENT_OTHER): Payer: Self-pay | Admitting: Obstetrics & Gynecology

## 2022-07-15 ENCOUNTER — Encounter: Payer: Self-pay | Admitting: Hematology and Oncology

## 2022-07-15 ENCOUNTER — Telehealth (HOSPITAL_COMMUNITY): Payer: Self-pay | Admitting: Pharmacy Technician

## 2022-07-15 ENCOUNTER — Encounter (HOSPITAL_COMMUNITY): Payer: Self-pay | Admitting: Internal Medicine

## 2022-07-15 ENCOUNTER — Telehealth: Payer: Self-pay | Admitting: Hematology and Oncology

## 2022-07-15 DIAGNOSIS — Z01818 Encounter for other preprocedural examination: Secondary | ICD-10-CM | POA: Insufficient documentation

## 2022-07-15 LAB — CBC
HCT: 41.9 % (ref 36.0–46.0)
Hemoglobin: 14 g/dL (ref 12.0–15.0)
MCH: 30.6 pg (ref 26.0–34.0)
MCHC: 33.4 g/dL (ref 30.0–36.0)
MCV: 91.5 fL (ref 80.0–100.0)
Platelets: 241 10*3/uL (ref 150–400)
RBC: 4.58 MIL/uL (ref 3.87–5.11)
RDW: 12.9 % (ref 11.5–15.5)
WBC: 6.8 10*3/uL (ref 4.0–10.5)
nRBC: 0 % (ref 0.0–0.2)

## 2022-07-15 LAB — BASIC METABOLIC PANEL
Anion gap: 9 (ref 5–15)
BUN: 19 mg/dL (ref 6–20)
CO2: 28 mmol/L (ref 22–32)
Calcium: 9.3 mg/dL (ref 8.9–10.3)
Chloride: 101 mmol/L (ref 98–111)
Creatinine, Ser: 0.86 mg/dL (ref 0.44–1.00)
GFR, Estimated: >60 mL/min (ref >60)
Glucose, Bld: 233 mg/dL — ABNORMAL HIGH (ref 70–99)
Potassium: 5.3 mmol/L — ABNORMAL HIGH (ref 3.5–5.1)
Sodium: 138 mmol/L (ref 135–145)

## 2022-07-15 NOTE — Telephone Encounter (Signed)
Patient Advocate Encounter   Received notification from [employer] that prior authorization for Sherryll Burger is required.   PA submitted on CoverMyMeds Key [id number] Status is pending   Will continue to follow.

## 2022-07-15 NOTE — Telephone Encounter (Signed)
Scheduled appointment per scheduling message. Patient is aware of the made appointment. 

## 2022-07-16 ENCOUNTER — Encounter (HOSPITAL_BASED_OUTPATIENT_CLINIC_OR_DEPARTMENT_OTHER): Payer: 59 | Admitting: Obstetrics & Gynecology

## 2022-07-16 ENCOUNTER — Encounter (HOSPITAL_BASED_OUTPATIENT_CLINIC_OR_DEPARTMENT_OTHER): Payer: Self-pay | Admitting: *Deleted

## 2022-07-16 ENCOUNTER — Other Ambulatory Visit (HOSPITAL_BASED_OUTPATIENT_CLINIC_OR_DEPARTMENT_OTHER): Payer: Self-pay | Admitting: Obstetrics & Gynecology

## 2022-07-16 DIAGNOSIS — Z01818 Encounter for other preprocedural examination: Secondary | ICD-10-CM

## 2022-07-16 NOTE — Anesthesia Preprocedure Evaluation (Addendum)
Anesthesia Evaluation  Patient identified by MRN, date of birth, ID band Patient awake    Reviewed: Allergy & Precautions, NPO status , Patient's Chart, lab work & pertinent test results  History of Anesthesia Complications (+) PONV and history of anesthetic complications  Airway Mallampati: II  TM Distance: >3 FB Neck ROM: Full    Dental  (+) Dental Advisory Given, Teeth Intact   Pulmonary former smoker   Pulmonary exam normal        Cardiovascular negative cardio ROS Normal cardiovascular exam   '23 TTE - normal EF, no valvulopathy    Neuro/Psych  PSYCHIATRIC DISORDERS  Depression    negative neurological ROS     GI/Hepatic ,GERD  Controlled,,(+)     substance abuse  marijuana use  Endo/Other  diabetes, Type 1, Insulin Dependent   Insulin pump   Renal/GU negative Renal ROS     Musculoskeletal negative musculoskeletal ROS (+)    Abdominal   Peds  Hematology negative hematology ROS (+)   Anesthesia Other Findings   Reproductive/Obstetrics  s/p tubal ligation                              Anesthesia Physical Anesthesia Plan  ASA: 3  Anesthesia Plan: General   Post-op Pain Management: Tylenol PO (pre-op)* and Celebrex PO (pre-op)*   Induction: Intravenous  PONV Risk Score and Plan: 4 or greater and Treatment may vary due to age or medical condition, Ondansetron, Dexamethasone, Midazolam and Scopolamine patch - Pre-op  Airway Management Planned: Oral ETT  Additional Equipment: None  Intra-op Plan:   Post-operative Plan: Extubation in OR  Informed Consent: I have reviewed the patients History and Physical, chart, labs and discussed the procedure including the risks, benefits and alternatives for the proposed anesthesia with the patient or authorized representative who has indicated his/her understanding and acceptance.     Dental advisory given  Plan Discussed with:  CRNA and Anesthesiologist  Anesthesia Plan Comments:         Anesthesia Quick Evaluation

## 2022-07-16 NOTE — Telephone Encounter (Signed)
Advanced Heart Failure Patient Advocate Encounter  Prior Authorization for Stephanie Holden has been approved.    PA# [id number] Effective dates: 07/15/22 through 07/15/23  Updated patient via mychart.  Archer Asa, CPhT

## 2022-07-17 ENCOUNTER — Other Ambulatory Visit (HOSPITAL_BASED_OUTPATIENT_CLINIC_OR_DEPARTMENT_OTHER): Payer: Self-pay | Admitting: Obstetrics & Gynecology

## 2022-07-17 ENCOUNTER — Ambulatory Visit (HOSPITAL_COMMUNITY)
Admission: RE | Admit: 2022-07-17 | Discharge: 2022-07-17 | Disposition: A | Discharge status: Home / Self Care | Payer: 59 | Source: Ambulatory Visit | Attending: Obstetrics & Gynecology | Admitting: Obstetrics & Gynecology

## 2022-07-17 ENCOUNTER — Other Ambulatory Visit: Payer: Self-pay

## 2022-07-17 ENCOUNTER — Inpatient Hospital Stay (HOSPITAL_BASED_OUTPATIENT_CLINIC_OR_DEPARTMENT_OTHER)
Admission: RE | Admit: 2022-07-17 | Discharge: 2022-07-19 | DRG: 908 | Disposition: A | Discharge status: Home / Self Care | Payer: 59 | Attending: Obstetrics & Gynecology | Admitting: Obstetrics & Gynecology

## 2022-07-17 ENCOUNTER — Encounter (HOSPITAL_BASED_OUTPATIENT_CLINIC_OR_DEPARTMENT_OTHER): Payer: Self-pay | Admitting: Obstetrics & Gynecology

## 2022-07-17 ENCOUNTER — Ambulatory Visit (HOSPITAL_BASED_OUTPATIENT_CLINIC_OR_DEPARTMENT_OTHER): Payer: 59 | Admitting: Anesthesiology

## 2022-07-17 ENCOUNTER — Encounter (HOSPITAL_COMMUNITY)
Admission: RE | Disposition: A | Discharge status: Home / Self Care | Payer: Self-pay | Source: Ambulatory Visit | Attending: Obstetrics & Gynecology

## 2022-07-17 DIAGNOSIS — I427 Cardiomyopathy due to drug and external agent: Secondary | ICD-10-CM | POA: Diagnosis present

## 2022-07-17 DIAGNOSIS — Z794 Long term (current) use of insulin: Secondary | ICD-10-CM

## 2022-07-17 DIAGNOSIS — Z79899 Other long term (current) drug therapy: Secondary | ICD-10-CM | POA: Diagnosis not present

## 2022-07-17 DIAGNOSIS — Z803 Family history of malignant neoplasm of breast: Secondary | ICD-10-CM

## 2022-07-17 DIAGNOSIS — Z9641 Presence of insulin pump (external) (internal): Secondary | ICD-10-CM | POA: Diagnosis present

## 2022-07-17 DIAGNOSIS — R935 Abnormal findings on diagnostic imaging of other abdominal regions, including retroperitoneum: Secondary | ICD-10-CM | POA: Diagnosis not present

## 2022-07-17 DIAGNOSIS — N950 Postmenopausal bleeding: Secondary | ICD-10-CM | POA: Diagnosis present

## 2022-07-17 DIAGNOSIS — Z853 Personal history of malignant neoplasm of breast: Secondary | ICD-10-CM

## 2022-07-17 DIAGNOSIS — Z9013 Acquired absence of bilateral breasts and nipples: Secondary | ICD-10-CM

## 2022-07-17 DIAGNOSIS — Z79810 Long term (current) use of selective estrogen receptor modulators (SERMs): Secondary | ICD-10-CM

## 2022-07-17 DIAGNOSIS — E109 Type 1 diabetes mellitus without complications: Secondary | ICD-10-CM

## 2022-07-17 DIAGNOSIS — Z9889 Other specified postprocedural states: Secondary | ICD-10-CM

## 2022-07-17 DIAGNOSIS — Z9851 Tubal ligation status: Secondary | ICD-10-CM

## 2022-07-17 DIAGNOSIS — W2201XA Walked into wall, initial encounter: Secondary | ICD-10-CM | POA: Diagnosis not present

## 2022-07-17 DIAGNOSIS — Z87891 Personal history of nicotine dependence: Secondary | ICD-10-CM

## 2022-07-17 DIAGNOSIS — K219 Gastro-esophageal reflux disease without esophagitis: Secondary | ICD-10-CM | POA: Diagnosis present

## 2022-07-17 DIAGNOSIS — Z8249 Family history of ischemic heart disease and other diseases of the circulatory system: Secondary | ICD-10-CM

## 2022-07-17 DIAGNOSIS — D62 Acute posthemorrhagic anemia: Secondary | ICD-10-CM | POA: Diagnosis not present

## 2022-07-17 DIAGNOSIS — I5022 Chronic systolic (congestive) heart failure: Secondary | ICD-10-CM | POA: Diagnosis present

## 2022-07-17 DIAGNOSIS — Z923 Personal history of irradiation: Secondary | ICD-10-CM

## 2022-07-17 DIAGNOSIS — E861 Hypovolemia: Secondary | ICD-10-CM

## 2022-07-17 DIAGNOSIS — I252 Old myocardial infarction: Secondary | ICD-10-CM

## 2022-07-17 DIAGNOSIS — N99840 Postprocedural hematoma of a genitourinary system organ or structure following a genitourinary system procedure: Principal | ICD-10-CM | POA: Diagnosis present

## 2022-07-17 DIAGNOSIS — T451X5A Adverse effect of antineoplastic and immunosuppressive drugs, initial encounter: Secondary | ICD-10-CM | POA: Diagnosis present

## 2022-07-17 DIAGNOSIS — I9581 Postprocedural hypotension: Secondary | ICD-10-CM | POA: Diagnosis not present

## 2022-07-17 DIAGNOSIS — F32A Depression, unspecified: Secondary | ICD-10-CM | POA: Diagnosis present

## 2022-07-17 DIAGNOSIS — Z885 Allergy status to narcotic agent status: Secondary | ICD-10-CM

## 2022-07-17 DIAGNOSIS — R55 Syncope and collapse: Secondary | ICD-10-CM | POA: Diagnosis not present

## 2022-07-17 DIAGNOSIS — Z98891 History of uterine scar from previous surgery: Secondary | ICD-10-CM

## 2022-07-17 DIAGNOSIS — N9489 Other specified conditions associated with female genital organs and menstrual cycle: Secondary | ICD-10-CM | POA: Diagnosis present

## 2022-07-17 DIAGNOSIS — T50995A Adverse effect of other drugs, medicaments and biological substances, initial encounter: Secondary | ICD-10-CM | POA: Diagnosis not present

## 2022-07-17 DIAGNOSIS — Z01818 Encounter for other preprocedural examination: Principal | ICD-10-CM

## 2022-07-17 HISTORY — PX: TOTAL LAPAROSCOPIC HYSTERECTOMY WITH SALPINGECTOMY: SHX6742

## 2022-07-17 HISTORY — DX: Presence of spectacles and contact lenses: Z97.3

## 2022-07-17 HISTORY — PX: VULVA /PERINEUM BIOPSY: SHX319

## 2022-07-17 HISTORY — PX: CYSTOSCOPY: SHX5120

## 2022-07-17 LAB — BASIC METABOLIC PANEL
Anion gap: 9 (ref 5–15)
BUN: 25 mg/dL — ABNORMAL HIGH (ref 6–20)
CO2: 26 mmol/L (ref 22–32)
Calcium: 8.5 mg/dL — ABNORMAL LOW (ref 8.9–10.3)
Chloride: 101 mmol/L (ref 98–111)
Creatinine, Ser: 0.91 mg/dL (ref 0.44–1.00)
GFR, Estimated: >60 mL/min (ref >60)
Glucose, Bld: 213 mg/dL — ABNORMAL HIGH (ref 70–99)
Potassium: 4.8 mmol/L (ref 3.5–5.1)
Sodium: 136 mmol/L (ref 135–145)

## 2022-07-17 LAB — CBC
HCT: 38 % (ref 36.0–46.0)
Hemoglobin: 12.6 g/dL (ref 12.0–15.0)
MCH: 30.3 pg (ref 26.0–34.0)
MCHC: 33.2 g/dL (ref 30.0–36.0)
MCV: 91.3 fL (ref 80.0–100.0)
Platelets: 260 10*3/uL (ref 150–400)
RBC: 4.16 MIL/uL (ref 3.87–5.11)
RDW: 12.9 % (ref 11.5–15.5)
WBC: 11.5 10*3/uL — ABNORMAL HIGH (ref 4.0–10.5)
nRBC: 0 % (ref 0.0–0.2)

## 2022-07-17 LAB — TYPE AND SCREEN

## 2022-07-17 LAB — GLUCOSE, CAPILLARY
Glucose-Capillary: 150 mg/dL — ABNORMAL HIGH (ref 70–99)
Glucose-Capillary: 192 mg/dL — ABNORMAL HIGH (ref 70–99)
Glucose-Capillary: 250 mg/dL — ABNORMAL HIGH (ref 70–99)
Glucose-Capillary: 227 mg/dL — ABNORMAL HIGH (ref 70–99)

## 2022-07-17 LAB — BRAIN NATRIURETIC PEPTIDE: B Natriuretic Peptide: 113.5 pg/mL — ABNORMAL HIGH (ref 0.0–100.0)

## 2022-07-17 LAB — HEMOGLOBIN AND HEMATOCRIT, BLOOD
HCT: 33.2 % — ABNORMAL LOW (ref 36.0–46.0)
Hemoglobin: 10.9 g/dL — ABNORMAL LOW (ref 12.0–15.0)

## 2022-07-17 LAB — POCT PREGNANCY, URINE: Preg Test, Ur: NEGATIVE

## 2022-07-17 LAB — ABO/RH: ABO/RH(D): O POS

## 2022-07-17 SURGERY — HYSTERECTOMY, TOTAL, LAPAROSCOPIC, WITH SALPINGECTOMY
Anesthesia: General

## 2022-07-17 MED ORDER — BUPIVACAINE HCL (PF) 0.25 % IJ SOLN
INTRAMUSCULAR | Status: DC | PRN
  Administered 2022-07-17: 12 mL

## 2022-07-17 MED ORDER — ONDANSETRON HCL 4 MG PO TABS: 4.0000 mg | ORAL_TABLET | Freq: Four times a day (QID) | ORAL | Status: DC | PRN

## 2022-07-17 MED ORDER — LACTATED RINGERS IV SOLN: INTRAVENOUS | Status: DC

## 2022-07-17 MED ORDER — GLYCOPYRROLATE 0.2 MG/ML IJ SOLN
INTRAMUSCULAR | Status: DC | PRN
  Administered 2022-07-17: .1 mg via INTRAVENOUS

## 2022-07-17 MED ORDER — SODIUM CHLORIDE 0.9 % IV SOLN
2.0000 g | INTRAVENOUS | Status: AC
  Administered 2022-07-17: 2 g via INTRAVENOUS

## 2022-07-17 MED ORDER — SODIUM CHLORIDE 0.9 % IR SOLN
Status: DC | PRN
  Administered 2022-07-17: 1000 mL
  Administered 2022-07-17: 1000 mL via INTRAVESICAL

## 2022-07-17 MED ORDER — TRAMADOL HCL 50 MG PO TABS
ORAL_TABLET | ORAL | Status: AC
  Filled 2022-07-17: qty 1

## 2022-07-17 MED ORDER — MENTHOL 3 MG MT LOZG: 1.0000 | LOZENGE | OROMUCOSAL | Status: DC | PRN

## 2022-07-17 MED ORDER — ENOXAPARIN SODIUM 40 MG/0.4ML IJ SOSY
PREFILLED_SYRINGE | INTRAMUSCULAR | Status: AC
  Filled 2022-07-17: qty 0.4

## 2022-07-17 MED ORDER — GABAPENTIN 100 MG PO CAPS
100.0000 mg | ORAL_CAPSULE | ORAL | Status: AC
  Administered 2022-07-17: 100 mg via ORAL

## 2022-07-17 MED ORDER — CELECOXIB 200 MG PO CAPS
ORAL_CAPSULE | ORAL | Status: AC
  Filled 2022-07-17: qty 1

## 2022-07-17 MED ORDER — MIDAZOLAM HCL 2 MG/2ML IJ SOLN
INTRAMUSCULAR | Status: AC
  Filled 2022-07-17: qty 2

## 2022-07-17 MED ORDER — OXYCODONE-ACETAMINOPHEN 5-325 MG PO TABS
ORAL_TABLET | ORAL | Status: AC
  Filled 2022-07-17: qty 1

## 2022-07-17 MED ORDER — PROPOFOL 10 MG/ML IV BOLUS
INTRAVENOUS | Status: DC | PRN
  Administered 2022-07-17: 170 mg via INTRAVENOUS

## 2022-07-17 MED ORDER — MORPHINE SULFATE (PF) 4 MG/ML IV SOLN
INTRAVENOUS | Status: AC
  Filled 2022-07-17: qty 1

## 2022-07-17 MED ORDER — FENTANYL CITRATE (PF) 250 MCG/5ML IJ SOLN
INTRAMUSCULAR | Status: AC
  Filled 2022-07-17: qty 5

## 2022-07-17 MED ORDER — PHENYLEPHRINE HCL (PRESSORS) 10 MG/ML IV SOLN
INTRAVENOUS | Status: AC
  Filled 2022-07-17: qty 1

## 2022-07-17 MED ORDER — DEXAMETHASONE SODIUM PHOSPHATE 10 MG/ML IJ SOLN
INTRAMUSCULAR | Status: DC | PRN
  Administered 2022-07-17: 10 mg via INTRAVENOUS

## 2022-07-17 MED ORDER — SIMETHICONE 80 MG PO CHEW
80.0000 mg | CHEWABLE_TABLET | Freq: Four times a day (QID) | ORAL | Status: DC | PRN
  Administered 2022-07-17 (×2): 80 mg via ORAL

## 2022-07-17 MED ORDER — PANTOPRAZOLE SODIUM 40 MG IV SOLR
INTRAVENOUS | Status: AC
  Filled 2022-07-17: qty 10

## 2022-07-17 MED ORDER — SACUBITRIL-VALSARTAN 97-103 MG PO TABS
1.0000 | ORAL_TABLET | Freq: Two times a day (BID) | ORAL | Status: DC
  Administered 2022-07-18: 1 via ORAL
  Filled 2022-07-17 (×4): qty 1

## 2022-07-17 MED ORDER — PHENYLEPHRINE 80 MCG/ML (10ML) SYRINGE FOR IV PUSH (FOR BLOOD PRESSURE SUPPORT)
PREFILLED_SYRINGE | INTRAVENOUS | Status: DC | PRN
  Administered 2022-07-17 (×2): 40 ug via INTRAVENOUS
  Administered 2022-07-17: 80 ug via INTRAVENOUS

## 2022-07-17 MED ORDER — SIMETHICONE 80 MG PO CHEW
CHEWABLE_TABLET | ORAL | Status: AC
  Filled 2022-07-17: qty 1

## 2022-07-17 MED ORDER — MORPHINE SULFATE (PF) 4 MG/ML IV SOLN
1.0000 mg | INTRAVENOUS | Status: DC | PRN
  Administered 2022-07-17 (×2): 1 mg via INTRAVENOUS

## 2022-07-17 MED ORDER — KETOROLAC TROMETHAMINE 30 MG/ML IJ SOLN
INTRAMUSCULAR | Status: AC
  Filled 2022-07-17: qty 1

## 2022-07-17 MED ORDER — FENTANYL CITRATE (PF) 250 MCG/5ML IJ SOLN
INTRAMUSCULAR | Status: DC | PRN
  Administered 2022-07-17: 50 ug via INTRAVENOUS
  Administered 2022-07-17: 25 ug via INTRAVENOUS
  Administered 2022-07-17: 50 ug via INTRAVENOUS
  Administered 2022-07-17 (×5): 25 ug via INTRAVENOUS

## 2022-07-17 MED ORDER — ACETAMINOPHEN 500 MG PO TABS
ORAL_TABLET | ORAL | Status: AC
  Filled 2022-07-17: qty 2

## 2022-07-17 MED ORDER — OXYCODONE-ACETAMINOPHEN 5-325 MG PO TABS
1.0000 | ORAL_TABLET | ORAL | Status: DC | PRN
  Administered 2022-07-17: 1 via ORAL

## 2022-07-17 MED ORDER — SODIUM CHLORIDE 0.9 % IV SOLN: 12.5000 mg | INTRAVENOUS | Status: DC

## 2022-07-17 MED ORDER — ONDANSETRON HCL 4 MG/2ML IJ SOLN
INTRAMUSCULAR | Status: DC | PRN
  Administered 2022-07-17: 4 mg via INTRAVENOUS

## 2022-07-17 MED ORDER — TRAMADOL HCL 50 MG PO TABS
50.0000 mg | ORAL_TABLET | Freq: Four times a day (QID) | ORAL | Status: DC | PRN
  Administered 2022-07-17 – 2022-07-18 (×2): 50 mg via ORAL
  Filled 2022-07-17: qty 1

## 2022-07-17 MED ORDER — SODIUM CHLORIDE 0.9 % IV SOLN
12.5000 mg | INTRAVENOUS | Status: DC | PRN
  Administered 2022-07-17: 12.5 mg via INTRAVENOUS
  Filled 2022-07-17: qty 12.5

## 2022-07-17 MED ORDER — PANTOPRAZOLE SODIUM 40 MG IV SOLR
40.0000 mg | Freq: Every day | INTRAVENOUS | Status: DC
  Administered 2022-07-17: 40 mg via INTRAVENOUS

## 2022-07-17 MED ORDER — CELECOXIB 200 MG PO CAPS
200.0000 mg | ORAL_CAPSULE | Freq: Once | ORAL | Status: AC
  Administered 2022-07-17: 200 mg via ORAL

## 2022-07-17 MED ORDER — LACTATED RINGERS IV BOLUS
500.0000 mL | Freq: Once | INTRAVENOUS | Status: AC
  Administered 2022-07-17: 500 mL via INTRAVENOUS

## 2022-07-17 MED ORDER — ALUM & MAG HYDROXIDE-SIMETH 200-200-20 MG/5ML PO SUSP: 30.0000 mL | ORAL | Status: DC | PRN

## 2022-07-17 MED ORDER — DEXTROSE-NACL 5-0.45 % IV SOLN: INTRAVENOUS | Status: DC

## 2022-07-17 MED ORDER — FENTANYL CITRATE (PF) 100 MCG/2ML IJ SOLN
25.0000 ug | INTRAMUSCULAR | Status: DC | PRN
  Administered 2022-07-17: 50 ug via INTRAVENOUS
  Administered 2022-07-17: 25 ug via INTRAVENOUS

## 2022-07-17 MED ORDER — OXYCODONE HCL 5 MG PO TABS: 5.0000 mg | ORAL_TABLET | Freq: Once | ORAL | Status: DC | PRN

## 2022-07-17 MED ORDER — DIPHENHYDRAMINE HCL 50 MG/ML IJ SOLN
INTRAMUSCULAR | Status: DC | PRN
  Administered 2022-07-17: 12.5 mg via INTRAVENOUS

## 2022-07-17 MED ORDER — OXYCODONE HCL 5 MG/5ML PO SOLN: 5.0000 mg | Freq: Once | ORAL | Status: DC | PRN

## 2022-07-17 MED ORDER — GABAPENTIN 100 MG PO CAPS
ORAL_CAPSULE | ORAL | Status: AC
  Filled 2022-07-17: qty 1

## 2022-07-17 MED ORDER — LIDOCAINE 2% (20 MG/ML) 5 ML SYRINGE
INTRAMUSCULAR | Status: DC | PRN
  Administered 2022-07-17: 60 mg via INTRAVENOUS

## 2022-07-17 MED ORDER — INSULIN PUMP
SUBCUTANEOUS | Status: DC
  Administered 2022-07-17 (×2): 1 via SUBCUTANEOUS
  Filled 2022-07-17: qty 1

## 2022-07-17 MED ORDER — IBUPROFEN 200 MG PO TABS
ORAL_TABLET | ORAL | Status: AC
  Filled 2022-07-17: qty 3

## 2022-07-17 MED ORDER — KETOROLAC TROMETHAMINE 30 MG/ML IJ SOLN: 30.0000 mg | Freq: Four times a day (QID) | INTRAMUSCULAR | Status: DC

## 2022-07-17 MED ORDER — ACETAMINOPHEN 500 MG PO TABS
1000.0000 mg | ORAL_TABLET | Freq: Four times a day (QID) | ORAL | Status: DC
  Administered 2022-07-17 – 2022-07-19 (×5): 1000 mg via ORAL
  Filled 2022-07-17 (×4): qty 2

## 2022-07-17 MED ORDER — PROMETHAZINE HCL 25 MG/ML IJ SOLN: 6.2500 mg | INTRAMUSCULAR | Status: DC | PRN

## 2022-07-17 MED ORDER — MIDAZOLAM HCL 2 MG/2ML IJ SOLN
INTRAMUSCULAR | Status: DC | PRN
  Administered 2022-07-17: 2 mg via INTRAVENOUS

## 2022-07-17 MED ORDER — MORPHINE SULFATE (PF) 2 MG/ML IV SOLN
INTRAVENOUS | Status: AC
  Filled 2022-07-17: qty 1

## 2022-07-17 MED ORDER — SCOPOLAMINE 1 MG/3DAYS TD PT72
1.0000 | MEDICATED_PATCH | TRANSDERMAL | Status: DC
  Administered 2022-07-17: 1.5 mg via TRANSDERMAL

## 2022-07-17 MED ORDER — SODIUM CHLORIDE 0.9 % IV SOLN
INTRAVENOUS | Status: DC | PRN
  Administered 2022-07-17: 120 mL

## 2022-07-17 MED ORDER — PHENYLEPHRINE HCL-NACL 20-0.9 MG/250ML-% IV SOLN
INTRAVENOUS | Status: DC | PRN
  Administered 2022-07-17: 25 ug/min via INTRAVENOUS

## 2022-07-17 MED ORDER — POVIDONE-IODINE 10 % EX SWAB: 2.0000 | Freq: Once | CUTANEOUS | Status: DC

## 2022-07-17 MED ORDER — ONDANSETRON HCL 4 MG/2ML IJ SOLN
4.0000 mg | Freq: Four times a day (QID) | INTRAMUSCULAR | Status: DC | PRN
  Administered 2022-07-17: 4 mg via INTRAVENOUS

## 2022-07-17 MED ORDER — IBUPROFEN 200 MG PO TABS
600.0000 mg | ORAL_TABLET | Freq: Four times a day (QID) | ORAL | Status: DC
  Administered 2022-07-17: 600 mg via ORAL

## 2022-07-17 MED ORDER — FLUORESCEIN SODIUM 10 % IV SOLN
INTRAVENOUS | Status: DC | PRN
  Administered 2022-07-17: 10 mg via INTRAVENOUS

## 2022-07-17 MED ORDER — LACTATED RINGERS IV SOLN: INTRAVENOUS | Status: AC

## 2022-07-17 MED ORDER — ROCURONIUM BROMIDE 10 MG/ML (PF) SYRINGE
PREFILLED_SYRINGE | INTRAVENOUS | Status: DC | PRN
  Administered 2022-07-17: 10 mg via INTRAVENOUS
  Administered 2022-07-17: 50 mg via INTRAVENOUS

## 2022-07-17 MED ORDER — SCOPOLAMINE 1 MG/3DAYS TD PT72
MEDICATED_PATCH | TRANSDERMAL | Status: AC
  Filled 2022-07-17: qty 1

## 2022-07-17 MED ORDER — SODIUM CHLORIDE 0.9 % IV SOLN
INTRAVENOUS | Status: AC
  Filled 2022-07-17: qty 2

## 2022-07-17 MED ORDER — ENOXAPARIN SODIUM 40 MG/0.4ML IJ SOSY
40.0000 mg | PREFILLED_SYRINGE | INTRAMUSCULAR | Status: AC
  Administered 2022-07-17: 40 mg via SUBCUTANEOUS

## 2022-07-17 MED ORDER — PROPOFOL 10 MG/ML IV BOLUS
INTRAVENOUS | Status: AC
  Filled 2022-07-17: qty 20

## 2022-07-17 MED ORDER — FENTANYL CITRATE (PF) 100 MCG/2ML IJ SOLN
INTRAMUSCULAR | Status: AC
  Filled 2022-07-17: qty 2

## 2022-07-17 MED ORDER — DIPHENHYDRAMINE HCL 50 MG/ML IJ SOLN
INTRAMUSCULAR | Status: AC
  Filled 2022-07-17: qty 1

## 2022-07-17 MED ORDER — IOHEXOL 350 MG/ML SOLN
100.0000 mL | Freq: Once | INTRAVENOUS | Status: AC | PRN
  Administered 2022-07-17: 100 mL via INTRAVENOUS

## 2022-07-17 MED ORDER — ONDANSETRON HCL 4 MG/2ML IJ SOLN
INTRAMUSCULAR | Status: AC
  Filled 2022-07-17: qty 2

## 2022-07-17 MED ORDER — IBUPROFEN 200 MG PO TABS: 600.0000 mg | ORAL_TABLET | Freq: Four times a day (QID) | ORAL | Status: DC

## 2022-07-17 MED ORDER — ACETAMINOPHEN 500 MG PO TABS
1000.0000 mg | ORAL_TABLET | ORAL | Status: AC
  Administered 2022-07-17: 1000 mg via ORAL

## 2022-07-17 MED ORDER — CARVEDILOL 6.25 MG PO TABS
6.2500 mg | ORAL_TABLET | Freq: Two times a day (BID) | ORAL | Status: DC
  Filled 2022-07-17: qty 1

## 2022-07-17 MED ORDER — SUGAMMADEX SODIUM 200 MG/2ML IV SOLN
INTRAVENOUS | Status: DC | PRN
  Administered 2022-07-17: 250 mg via INTRAVENOUS

## 2022-07-17 MED ORDER — ACETAMINOPHEN 500 MG PO TABS: 1000.0000 mg | ORAL_TABLET | Freq: Once | ORAL | Status: DC

## 2022-07-17 SURGICAL SUPPLY — 66 items
ADH SKN CLS APL DERMABOND .7 (GAUZE/BANDAGES/DRESSINGS) ×3
APL PRP STRL LF DISP 70% ISPRP (MISCELLANEOUS) ×3
APL SRG 38 LTWT LNG FL B (MISCELLANEOUS) ×3
APPLICATOR ARISTA FLEXITIP XL (MISCELLANEOUS) IMPLANT
BLADE SURG 10 STRL SS (BLADE) IMPLANT
CABLE HIGH FREQUENCY MONO STRZ (ELECTRODE) IMPLANT
CELL SAVER LIPIGURD (MISCELLANEOUS) IMPLANT
CHLORAPREP W/TINT 26 (MISCELLANEOUS) ×3 IMPLANT
COVER BACK TABLE 60X90IN (DRAPES) ×3 IMPLANT
COVER MAYO STAND STRL (DRAPES) ×3 IMPLANT
COVER SURGICAL LIGHT HANDLE (MISCELLANEOUS) IMPLANT
DERMABOND ADVANCED .7 DNX12 (GAUZE/BANDAGES/DRESSINGS) ×3 IMPLANT
DEVICE RETRIEVAL ALEXIS 14 (MISCELLANEOUS) IMPLANT
DILATOR CANAL MILEX (MISCELLANEOUS) IMPLANT
DRAPE SURG IRRIG POUCH 19X23 (DRAPES) ×3 IMPLANT
DRSG COVADERM PLUS 2X2 (GAUZE/BANDAGES/DRESSINGS) IMPLANT
EXTRT SYSTEM ALEXIS 14CM (MISCELLANEOUS)
EXTRT SYSTEM ALEXIS 17CM (MISCELLANEOUS)
GAUZE 4X4 16PLY ~~LOC~~+RFID DBL (SPONGE) ×6 IMPLANT
GLOVE BIO SURGEON STRL SZ 6.5 (GLOVE) ×3 IMPLANT
GLOVE BIOGEL PI IND STRL 6.5 (GLOVE) ×3 IMPLANT
GLOVE BIOGEL PI IND STRL 7.0 (GLOVE) ×6 IMPLANT
GLOVE ECLIPSE 6.5 STRL STRAW (GLOVE) ×6 IMPLANT
GOWN STRL REUS W/TWL XL LVL3 (GOWN DISPOSABLE) ×6 IMPLANT
HEMOSTAT ARISTA ABSORB 3G PWDR (HEMOSTASIS) IMPLANT
KIT PINK PAD W/HEAD ARE REST (MISCELLANEOUS) ×3
KIT PINK PAD W/HEAD ARM REST (MISCELLANEOUS) ×3 IMPLANT
KIT TURNOVER CYSTO (KITS) ×3 IMPLANT
LEGGING LITHOTOMY PAIR STRL (DRAPES) ×3 IMPLANT
LIGASURE VESSEL 5MM BLUNT TIP (ELECTROSURGICAL) ×3 IMPLANT
NDL INSUFFLATION 14GA 120MM (NEEDLE) ×3 IMPLANT
NEEDLE INSUFFLATION 14GA 120MM (NEEDLE) ×3 IMPLANT
NS IRRIG 1000ML POUR BTL (IV SOLUTION) ×3 IMPLANT
OCCLUDER COLPOPNEUMO (BALLOONS) ×3 IMPLANT
PACK LAPAROSCOPY BASIN (CUSTOM PROCEDURE TRAY) ×3 IMPLANT
PENCIL SMOKE EVACUATOR (MISCELLANEOUS) IMPLANT
POUCH LAPAROSCOPIC INSTRUMENT (MISCELLANEOUS) ×3 IMPLANT
PROTECTOR NERVE ULNAR (MISCELLANEOUS) IMPLANT
SCISSORS LAP 5X35 DISP (ENDOMECHANICALS) IMPLANT
SET IRRIG Y TYPE TUR BLADDER L (SET/KITS/TRAYS/PACK) ×3 IMPLANT
SET SUCTION IRRIG HYDROSURG (IRRIGATION / IRRIGATOR) IMPLANT
SET TRI-LUMEN FLTR TB AIRSEAL (TUBING) ×3 IMPLANT
SHEARS HARMONIC ACE PLUS 36CM (ENDOMECHANICALS) ×3 IMPLANT
SLEEVE SCD COMPRESS KNEE MED (STOCKING) ×3 IMPLANT
SUT VIC AB 0 CT1 27 (SUTURE) ×6
SUT VIC AB 0 CT1 27XBRD ANBCTR (SUTURE) ×6 IMPLANT
SUT VIC AB 4-0 PS2 18 (SUTURE) ×3 IMPLANT
SUT VICRYL 0 UR6 27IN ABS (SUTURE) IMPLANT
SUT VLOC 180 0 9IN  GS21 (SUTURE) ×3
SUT VLOC 180 0 9IN GS21 (SUTURE) ×3 IMPLANT
SYR 10ML LL (SYRINGE) ×3 IMPLANT
SYR 50ML LL SCALE MARK (SYRINGE) ×6 IMPLANT
SYSTEM CARTER THOMASON II (TROCAR) IMPLANT
SYSTEM CONTND EXTRCTN KII BLLN (MISCELLANEOUS) IMPLANT
TIP UTERINE 5.1X6CM LAV DISP (MISCELLANEOUS) IMPLANT
TIP UTERINE 6.7X10CM GRN DISP (MISCELLANEOUS) IMPLANT
TIP UTERINE 6.7X6CM WHT DISP (MISCELLANEOUS) IMPLANT
TIP UTERINE 6.7X8CM BLUE DISP (MISCELLANEOUS) IMPLANT
TOWEL OR 17X24 6PK STRL BLUE (TOWEL DISPOSABLE) ×6 IMPLANT
TRAY FOLEY W/BAG SLVR 14FR LF (SET/KITS/TRAYS/PACK) ×3 IMPLANT
TROCAR ADV FIXATION 5X100MM (TROCAR) ×3 IMPLANT
TROCAR KII 8X100ML NONTHREADED (TROCAR) ×3 IMPLANT
TROCAR PORT AIRSEAL 5X120 (TROCAR) ×3 IMPLANT
TROCAR Z-THREAD FIOS 5X100MM (TROCAR) ×3 IMPLANT
WARMER LAPAROSCOPE (MISCELLANEOUS) ×3 IMPLANT
WATER STERILE IRR 3000ML UROMA (IV SOLUTION) ×3 IMPLANT

## 2022-07-17 NOTE — Progress Notes (Signed)
07/17/2022 3:22 PM At 1415, RN was assisting patient to BR per patient request. Pt. Assisted to side of bed and then to standing position for approximately 1 minutes before attempting ambulation. Halfway to BR, pt. Stopped and was slightly unsteady. RN offered patient chance to sit back down on bed but she declined. Felt she could make it. Upon reaching threshold of BR, pt. Knees buckled,Went limp in RN arms, RN attempted to catch patient to slow down her fall. Pt. Went onto her knees then turned slightly to her right. RN attempted to grab her head but she did make a mild impact to the right side of her head against room wall. Pt. Was unconscious for approximately 15 seconds. Airway and breathing intact. Pt. Daughter in room and able to call in hallway for help. Virgil Benedict RN immediately at bedside, assisted RN in getting patient back to bed. VS obtained and stable. CBG 227. MD called and orders obtained for STAT HGB and CT Head. Neuro assessment WNL. Mild head pain. Abdomen soft, tender, no bleeding noted on peripad. Family at bedside, concerned but agreeable to plan. Will continue to closely monitor patient.  Koah Chisenhall, Blanchard Kelch

## 2022-07-17 NOTE — Progress Notes (Signed)
07/17/2022 1815 Pt. Requested assistance to BR. BSC offered to patient. Two RN's at bedside assisted pt. To bsc. Pt. Trying to have bm, became nauseous, flushed, felt "hot". Started to exhibit s/s of near syncope so immediately assisted back to bed. Symptoms resolved upon return to supine position. Dr. Hyacinth Meeker called and made aware. Orders for EKG, BMET, BNP, and replace foley given and enacted by RN. Will continue to closely monitor patient.  Gerald Kuehl, Blanchard Kelch

## 2022-07-17 NOTE — H&P (Signed)
Stephanie Holden is an 47 y.o. female [phone] MWF with hx of postmenopausal bleeding, 13mm endometrium with vascularity and likely malpositioned Essure tubal occlusion device.  Pt has hx of right breast cancer diagnosed in 2019.  She was treated with bilateral mastectomies.  She has six positive lymph nodes.  She was treated with chemotherapy and radiation and is on tamoxifen.  She has worried about endometrial cancer risk with tamoxifen and desires for this risk to be gone.  She has spoke with Dr. Pamelia Hoit, as well, about ovary removal.  He is supportive of this decision and would switch pt to an aromatase inhibitor post operatively.  She has decided to proceed with hysterectomy.  Endometrial biopsy was negative pre-operatively on 02/28/2022.  Risks, benefits, alternatives have been discussed.  This is documented in prior office notes.  She is aware of risks of bowel, bladder, ureteral injury as well as DVT, PE, possible MI, death.  She feels very strongly about her decision.  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  She has been followed by Dr. Nicholes Mango since then.  Last echo was 11/2021 with normalized EF.  I did reach out to him prior to surgery and he did not recommend any additional testing prior to surgery.  She is also has type 1 diabetes.  She has an insulin pump and also dexcom for watching blood sugars.  Last HbA1c was 7.1.  She and I have discussed site location for pump and glucose monitor for surgery.  I did reach out to her PCP for insulin instructions.  Guidance was to use glucomander so I reviewed with anesthesia, Dr. Richardson Landry.  Pt held regular insulin and gave 1/2 of basal insulin rate.  I did speak personally about this with her last week.  Pertinent Gynecological History: Menses: post-menopausal Bleeding: post menopausal bleeding Contraception:  postmenopausal DES exposure: denies Blood transfusions:   none Sexually transmitted diseases: no past history Previous GYN Procedures:  c section x 2   Last mammogram: bilateral mastectomies Last pap: normal Date: 01/27/2022 OB History: G3, P2 A1   Menstrual History:  No LMP recorded. (Menstrual status: Chemotherapy).    Past Medical History:  Diagnosis Date   Cancer    Breast cancer - right   CHF (congestive heart failure) 2020   chemo induced cardiomyopathy, 12/21/21 echocardiogram EF 55-60%   Constipation    resolved 07/09/22 per pt   Depression    Follows w/ Dr. Pamelia Hoit, oncologist.   Genetic testing 05/27/2017   Breast/GYN panel (23 genes) @ [facility] - No pathogenic mutations detected   GERD (gastroesophageal reflux disease)    resolved per patient as of 07/09/2022   History of chemotherapy 2019   Follows w/ Dr. Pamelia Hoit @ [facility].   History of radiation therapy 12/30/17- 02/10/18   Right chest wall/ 50 Gy in 25 fractions, Right SCV, PAB nodes/ 50 Gy in 25 fractions, Right Chest wall boost 10 gy in 5 fractions.    Insulin dependent type 1 diabetes mellitus    Follows w/ Dr. Darral Dash, endocronologist.   Insulin pump in place    Pleural effusion 01/13/2018   Pneumonia 01/12/2018   PONV (postoperative nausea and vomiting)    Stenosing tenosynovitis of thumb 06/2014   right   Thyroid goiter    Wears contact lenses    Wears glasses     Past Surgical History:  Procedure Laterality Date   AXILLARY LYMPH NODE DISSECTION  Right 06/26/2017   Procedure: AXILLARY LYMPH NODE DISSECTION;  Surgeon: Almond Lint, MD;  Location: [facility] OR;  Service: General;  Laterality: Right;   CARPAL TUNNEL RELEASE Bilateral 10/28/2013   Procedure: BILATERAL CARPAL TUNNEL RELEASE;  Surgeon: Cindee Salt, MD;  Location: [facility];  Service: Orthopedics;  Laterality: Bilateral;   CESAREAN SECTION  2000; 08/20/2001   DILATION AND CURETTAGE OF UTERUS  1998   ENDOMETRIAL BIOPSY  02/28/2022   benign   ESSURE TUBAL LIGATION  2005    failed, tubal puncture   LAPAROSCOPIC TUBAL LIGATION  04/02/2007   removal Essure   LAPAROSCOPIC UNILATERAL SALPINGECTOMY  2009   MASS EXCISION Left 03/11/2018   Procedure: EXCISION LEFT CHEST WALL MASS;  Surgeon: Almond Lint, MD;  Location: [facility] OR;  Service: General;  Laterality: Left;   MASTECTOMY W/ SENTINEL NODE BIOPSY Bilateral 06/05/2017   Procedure: BILATERAL MASTECTOMIES  WITH RIGHT SENTINEL LYMPH NODE BIOPSY;  Surgeon: Almond Lint, MD;  Location: [facility];  Service: General;  Laterality: Bilateral;   PORT-A-CATH REMOVAL N/A 03/11/2018   Procedure: REMOVAL PORT-A-CATH;  Surgeon: Almond Lint, MD;  Location: [facility];  Service: General;  Laterality: N/A;   PORTACATH PLACEMENT Left 06/26/2017   Procedure: INSERTION PORT-A-CATH;  Surgeon: Almond Lint, MD;  Location: [facility];  Service: General;  Laterality: Left;   TRIGGER FINGER RELEASE Left 04/12/2014   Procedure: RELEASE A-1 PULLEY LEFT THUMB;  Surgeon: Cindee Salt, MD;  Location: [facility];  Service: Orthopedics;  Laterality: Left;   TRIGGER FINGER RELEASE Right 07/19/2014   Procedure: RELEASE TRIGGER FINGER/A-1 PULLEY RIGHT THUMB;  Surgeon: Cindee Salt, MD;  Location: [facility];  Service: Orthopedics;  Laterality: Right;    Family History  Problem Relation Age of Onset   Hypertension Mother    Hypertension Father    Breast cancer Other 40       paternal grandfather's niece    Social History:  reports that she quit smoking about 21 years ago. Her smoking use included cigarettes. She has a 0.26 pack-year smoking history. She has never used smokeless tobacco. She reports current alcohol use. She reports current drug use. Drug: Marijuana.  Allergies:  Allergies  Allergen Reactions   Dilaudid [Hydromorphone Hcl] Itching and Other (See Comments)    UNCONTROLLABLE CRYING    Facility-Administered Medications Prior to Admission  Medication Dose Route Frequency Provider Last Rate Last Admin    acetaminophen (TYLENOL) tablet 650 mg  650 mg Oral Q6H PRN Chrzanowski, Jami B, NP       Medications Prior to Admission  Medication Sig Dispense Refill Last Dose   Multiple Vitamin (MULTIVITAMIN WITH MINERALS) TABS tablet Take 1 tablet by mouth daily.      Probiotic Product (PROBIOTIC BLEND PO) Take by mouth.      amphetamine-dextroamphetamine (ADDERALL XR) 20 MG 24 hr capsule Take 20 mg by mouth every morning.      carvedilol (COREG) 6.25 MG tablet TAKE 1 TABLET BY MOUTH TWICE DAILY WITH A MEAL 180 tablet 3    Continuous Blood Gluc Sensor (DEXCOM G6 SENSOR) MISC Apply topically every 3 (three) days.      CONTOUR TEST test strip USE TO TEST BLOOD SUGARS 6 TIMES DAILY ON PUMP DX  E10.9      furosemide (LASIX) 40 MG tablet TAKE 1 TABLET BY MOUTH EVERY DAY AS NEEDED FOR FLUID/EDEMA 90 tablet 3    HUMALOG 100 UNIT/ML injection Inject into the skin. Via Insulin pump / average 36  units per day per pt      Insulin Disposable Pump (OMNIPOD 5 G6 POD, GEN 5,) MISC SMARTSIG:SUB-Q Every 3 Days      KLOR-CON M20 20 MEQ tablet TAKE 1/2 TABLET BY MOUTH EVERY DAY AS NEEDED WITH FUROSEMIDE 45 tablet 2    LORazepam (ATIVAN) 0.5 MG tablet TAKE 1 TABLET BY MOUTH AT BEDTIME AS NEEDED FOR ANXIETY 30 tablet 3    pravastatin (PRAVACHOL) 20 MG tablet Take 20 mg by mouth daily.      Probiotic Product (PROBIOTIC PO) Take by mouth daily.      sacubitril-valsartan (ENTRESTO) 97-103 MG Take 1 tablet by mouth 2 (two) times daily. 180 tablet 3    spironolactone (ALDACTONE) 25 MG tablet TAKE 1/2 TABLET BY MOUTH EVERY DAY (Patient taking differently: daily.) 45 tablet 3    venlafaxine XR (EFFEXOR-XR) 75 MG 24 hr capsule TAKE 1 CAPSULE BY MOUTH DAILY WITH BREAKFAST (Patient taking differently: Take 75 mg by mouth daily with breakfast. Takes 37.5 mg daily) 90 capsule 3     Review of Systems  Constitutional: Negative.   Genitourinary:        Postmenopausal bleeding    Weight 64.9 kg. Physical Exam Constitutional:       Appearance: Normal appearance.  Cardiovascular:     Rate and Rhythm: Normal rate and regular rhythm.  Pulmonary:     Effort: Pulmonary effort is normal.     Breath sounds: Normal breath sounds.  Neurological:     General: No focal deficit present.     Mental Status: She is alert.  Psychiatric:        Mood and Affect: Mood normal.     Results for orders placed or performed during the hospital encounter of 07/17/22 (from the past 24 hour(s))  Pregnancy, urine POC     Status: None   Collection Time: 07/17/22  5:57 AM  Result Value Ref Range   Preg Test, Ur NEGATIVE NEGATIVE    No results found.  Assessment/Plan: 47 yo G3P2A1 MWF with thickened endometrium, probably endometrial polyp, malpositioned Essure device, and use of high risk medication who is desirous of hysterectomy with ovary removal.  Risks, benefits, alternatives including hysteroscopy have been discussed.  She is ready to proceed with more definitve surgical treatment.  Jerene Bears 07/17/2022, 6:09 AM

## 2022-07-17 NOTE — Anesthesia Procedure Notes (Signed)
Procedure Name: Intubation Date/Time: 07/17/2022 7:46 AM  Performed by: Dairl Ponder, CRNAPre-anesthesia Checklist: Patient identified, Emergency Drugs available, Suction available and Patient being monitored Patient Re-evaluated:Patient Re-evaluated prior to induction Oxygen Delivery Method: Circle System Utilized Preoxygenation: Pre-oxygenation with 100% oxygen Induction Type: IV induction Ventilation: Mask ventilation without difficulty Laryngoscope Size: Mac and 3 Grade View: Grade I Tube type: Oral Tube size: 7.0 mm Number of attempts: 1 Airway Equipment and Method: Stylet and Oral airway Placement Confirmation: ETT inserted through vocal cords under direct vision, positive ETCO2 and breath sounds checked- equal and bilateral Secured at: 21 cm Tube secured with: Tape Dental Injury: Teeth and Oropharynx as per pre-operative assessment

## 2022-07-17 NOTE — Progress Notes (Addendum)
07/17/2022 3:44 PM Dr. Hyacinth Meeker at bedside. Updated family on plan of care. Orders received. Give 500 LR bolus over 2 hrs, change IVF to LR at 75 ml/hr, change patient to scheduled Ibuprofen and Tylenol with Tramadol PRN for breakthrough pain. Orders enacted.  Will continue to closely monitor patient.  Ozella Comins, Blanchard Kelch

## 2022-07-17 NOTE — Anesthesia Postprocedure Evaluation (Signed)
Anesthesia Post Note  Patient: Stephanie Holden  Procedure(s) Performed: TOTAL LAPAROSCOPIC HYSTERECTOMY WITH SALPINGECTOMY POSSIBLE OOPHORECTOMY (Bilateral) CYSTOSCOPY VULVAR BIOPSY     Patient location during evaluation: PACU Anesthesia Type: General Level of consciousness: awake and alert Pain management: pain level controlled Vital Signs Assessment: post-procedure vital signs reviewed and stable Respiratory status: spontaneous breathing, nonlabored ventilation and respiratory function stable Cardiovascular status: stable and blood pressure returned to baseline Anesthetic complications: no   No notable events documented.  Last Vitals:  Vitals:   07/17/22 1245 07/17/22 1415  BP: 120/71 106/70  Pulse: 79 82  Resp: 16 14  Temp: 36.6 C   SpO2: 98% 95%    Last Pain:  Vitals:   07/17/22 1245  TempSrc:   PainSc: 6                  Beryle Lathe

## 2022-07-17 NOTE — Progress Notes (Signed)
Day of Surgery Procedure(s) (LRB): TOTAL LAPAROSCOPIC HYSTERECTOMY WITH SALPINGECTOMY POSSIBLE OOPHORECTOMY (Bilateral) CYSTOSCOPY (N/A) VULVAR BIOPSY  Subjective: Patient reports feeling tired and some lower abdominal pain.   Patient has experienced three episodes of syncope or almost syncope today.  The first one was around 2:15pm and then 6:30pm.  All of these have occurred when she tried to get up and void.  The first episode involved hitting her head on the wall so she had a CT done of her head.  This was normal.  After the third episode, CMP and BNP was ordered as well as EKG.  EKG with minimal voltage criteria for LVH and BNP mildly elevated at 116.  CMP with normal potassium and sodium and normal creatinine.  I spoke with cardiologist on call who did not feel any additional evaluation for her heart is needed right now.    Foley replaced for pt to decrease ambulation for now.  UOP 450 over past 9 hours.  Objective: I have reviewed patient's vital signs and intake and output.  General: pale Resp: clear to auscultation bilaterally Cardio: regular rate and rhythm, S1, S2 normal, no murmur, click, rub or gallop GI: incision: clean, dry, and intact and soft, non distended and normal BS Extremities: extremities normal, atraumatic, no cyanosis or edema Vaginal Bleeding: minimal  Assessment: s/p Procedure(s): TOTAL LAPAROSCOPIC HYSTERECTOMY WITH SALPINGECTOMY POSSIBLE OOPHORECTOMY (Bilateral) CYSTOSCOPY (N/A) VULVAR BIOPSY:  now with post op syncope/near syncope,  reassuring EKG, normal electrolytes, hb of 12.6 at 2:45pm  Plan: Will recheck hemoglobin again now.  Continue IVF.   Monitor UOP.    LOS: 0 days    Jerene Bears, MD 07/17/2022, 8:38 PM

## 2022-07-17 NOTE — Progress Notes (Signed)
07/17/2022 2:33 PM Phlebotomy called made aware of stat hgb needed. CT called made aware of STAT CT head needed.  Markail Diekman, Blanchard Kelch

## 2022-07-17 NOTE — Progress Notes (Signed)
07/17/2022 1130 Insulin Pump Contract obtained from patient and witnessed by husband, Merlyn Albert. Pt. Able to verbalize understanding and use of her pump, basal rate, and has all supplies at bedside.  Jorge Retz, Blanchard Kelch

## 2022-07-17 NOTE — OR Nursing (Signed)
After entering the room Dr. Hyacinth Meeker assessed patient and wanted to take off a vulvar lesion that was not consented for. Dr,Miller obtained verbal consent on the phone with her husband and added it to the paper chart. This was included in the time Out

## 2022-07-17 NOTE — Op Note (Addendum)
07/17/2022  10:32 AM  PATIENT:  Stephanie Holden  47 y.o. female  PRE-OPERATIVE DIAGNOSIS:  PMB  POST-OPERATIVE DIAGNOSIS:  PMB  PROCEDURE:  Procedure(s): TOTAL LAPAROSCOPIC HYSTERECTOMY WITH SALPINGECTOMY POSSIBLE OOPHORECTOMY CYSTOSCOPY VULVAR BIOPSY  SURGEON:  Jerene Bears  ASSISTANTS: Berline Chough, MD.  An experienced assistant was required given the standard of surgical care given the complexity of the case.  This assistant was needed for exposure, dissection, suctioning, retraction, instrument exchange and for overall help during the procedure.  RNFA help was also unavailable.  ANESTHESIA:   general  ESTIMATED BLOOD LOSS: 50 mL  BLOOD ADMINISTERED:none   FLUIDS: 1000cc LR  UOP: 100cc, clear but concentrated  SPECIMEN:  uterus, cervix, portial   DISPOSITION OF SPECIMEN:  PATHOLOGY  FINDINGS: scarring from prior cesarean sections  DESCRIPTION OF OPERATION: Patient is taken to the operating room. She is placed in the supine position. She is a running IV in place. Informed consent was present on the chart. SCDs on her lower extremities and functioning properly. Patient was positioned while she was awake.  Her legs were placed in the low lithotomy position in [place] stirrups. Her arms were tucked by the side.  General endotracheal anesthesia was administered by the anesthesia staff without difficulty. Dr. Mal Amabile, anesthesia, oversaw case.  Time out performed.    Clora prep was then used to prep the abdomen and Hibiclens was used to prep the inner thighs, perineum and vagina. Once 3 minutes had past the patient was draped in a normal standard fashion. The legs were lifted to the high lithotomy position. The cervix was visualized by placing a heavy weighted speculum in the posterior aspect of the vagina and using a curved Deaver retractor to the retract anteriorly. The anterior lip of the cervix was grasped with single-tooth tenaculum.  The cervix sounded to 8.5 cm. Pratt dilators were  used to dilate the cervix up to a #21. A RUMI uterine manipulator was obtained. A #8 disposable tip was placed on the RUMI manipulator as well as a 3.0, silver KOH ring. This was passed through the cervix and the bulb of the disposable tip was inflated with 10 cc of normal saline. There was a good fit of the KOH ring around the cervix. The tenaculum was removed. There is also good manipulation of the uterus. The speculum and retractor were removed as well. A Foley catheter was placed to straight drain.  Clear urine was noted. The left labial lesion was then elevated and excised with metzembaum scissors.  Single figure of eight suture of #3.0 vicryl was used to close incision and for hemostasis.   Legs were lowered to the low lithotomy position and attention was turned the abdomen.  The umbilicus was everted.  Marcaine 0.25% used to anesthetize the skin.  Using #11 blade, 5mm skin incision was made.  A Veress needle was obtained. Syringe of sterile saline was placed on a open Veress needle.  With the abdomen elevated, the Veress needle was passed into the umbilicus until the pop was heard and then fluid started to drip.  Then low flow CO2 gas was attached the needle and the pneumoperitoneum was achieved without difficulty. Once 3.5 of gas was in the abdomen the Veress needle was removed and a 5 millimeter non-bladed Optiview trocar and port were passed directly to the abdomen. The laparoscope was then used to confirm intraperitoneal placement. Findings included scarring from prior cesarean section, partial tubes.  Locations for RLQ, LLQ, and suprapubic ports were  noted by transillumination of the abdominal wall.  0.25% marcaine was used to anesthetize the skin.  8mm skin incision was made in the RLQ and an AirSeal port was placed underdirect visualization of the laparoscope.  Then a 5mm skin incision was made and a 5mm nonbladed trochar and port was placed in the LLQ.  Finally, and 8mm skin incision was made about  4cm above the pubic symphasis and an 8mm non-bladed port was placed with direct visualization of the laparoscope.  All trochars were removed.    Ureters were identifies.  Attention was turned to the left side. With uterus on stretch the left IP ligament was serially clamped, cauterized and incised.  Then the left round ligament was serially clamped cauterized and incised. The anterior and posterior peritoneum of the inferior leaf of the broad ligament were opened. The beginning of the bladder flap was created.  The bladder was taken down below the level of the KOH ring. The left uterine artery skeletonized and then just superior to the KOH ring this vessel was serially clamped, cauterized, and incised.  Attention was turned the right side.  The uterus was placed on stretch to the opposite side.  The right IP ligament was isolated and clamped, cauterized and incised.  Next the right round ligament was serially clamped cauterized and incised. The anterior posterior peritoneum of the inferiorly for the broad ligament were opened. The anterior peritoneum was carried across to the dissection on the left side. The remainder of the bladder flap was created using sharp dissection. The bladder was well below the level of the KOH ring. The right uterine artery skeletonized. Then the right uterine artery, above the level of the KOH ring, was serially clamped cauterized and incised. The uterus was devascularized at this point.  The colpotomy was performed a starting in the midline and using a harmonic scalpel with the inferior edge of the open blade  This was carried around a circumferential fashion until the vaginal mucosa was completely incised in the specimen was freed.  The specimen was then delivered to the vagina.  A vaginal occlusive device was used to maintain the pneumoperitoneum  Instruments were changed with a needle driver and Kobra graspers.  Using a 9 inch V. lock suture, the cuff was closed by  incorporating the anterior and posterior vaginal mucosa in each stitch. This was carried across all the way to the left corner and a running fashion. Two stitches were brought back towards the midline and the suture was cut flush with the vagina. The needle was brought out the pelvis. The pelvis was irrigated. All pedicles were inspected. No bleeding was noted.   Co2 pressures were lowered to 8mm Hg.  Again, no bleeding was noted.  Ureters were noted deep in the pelvis to be peristalsing.  Arista was placed along the pedicles.  At this point the procedure was completed.  The remaining instruments were removed.  The ports (except the suprapubic port) were removed under direct visualization of the laparoscope and the pneumoperitoneum was relieved.  The patient was taken out of Trendelenburg positioning.  Several deep breaths were given to the patient's trying to any gas the abdomen and finally the suprapubic port was removed.  The skin was then closed with subcuticular stitches of 3-0 Vicryl. The skin was cleansed Dermabond was applied. Attention was then turned the vagina and the cuff was inspected. No bleeding was noted. The anterior posterior vaginal mucosa was incorporated in each stitch. The Foley catheter  was removed.  Cystoscopy was performed.  No sutures or bladder injuries were noted.  Ureters were noted with normal urine jets from each one was seen.  Foley was left out after the cystoscopic fluid was drained and cystoscope removed.  Sponge, lap, needle, instrument counts were correct x2. Patient tolerated the procedure very well. She was awakened from anesthesia, extubated and taken to recovery in stable condition.    COUNTS:  YES  PLAN OF CARE: Transfer to PACU

## 2022-07-17 NOTE — Transfer of Care (Signed)
Immediate Anesthesia Transfer of Care Note  Patient: Stephanie Holden  Procedure(s) Performed: TOTAL LAPAROSCOPIC HYSTERECTOMY WITH SALPINGECTOMY POSSIBLE OOPHORECTOMY (Bilateral) CYSTOSCOPY VULVAR BIOPSY  Patient Location: PACU  Anesthesia Type:General  Level of Consciousness: drowsy and patient cooperative  Airway & Oxygen Therapy: Patient Spontanous Breathing and Patient connected to face mask oxygen  Post-op Assessment: Report given to RN and Post -op Vital signs reviewed and stable  Post vital signs: Reviewed and stable  Last Vitals:  Vitals Value Taken Time  BP 134/66 07/17/22 1011  Temp    Pulse 62 07/17/22 1013  Resp 22 07/17/22 1013  SpO2 100 % 07/17/22 1013  Vitals shown include unvalidated device data.  Last Pain:  Vitals:   07/17/22 0622  TempSrc: Oral  PainSc: 0-No pain      Patients Stated Pain Goal: 5 (07/17/22 0622)  Complications: No notable events documented.

## 2022-07-18 ENCOUNTER — Encounter (HOSPITAL_COMMUNITY)
Admission: RE | Disposition: A | Discharge status: Home / Self Care | Payer: Self-pay | Source: Ambulatory Visit | Attending: Obstetrics & Gynecology

## 2022-07-18 ENCOUNTER — Inpatient Hospital Stay (HOSPITAL_COMMUNITY): Payer: 59 | Admitting: Certified Registered"

## 2022-07-18 ENCOUNTER — Encounter (HOSPITAL_BASED_OUTPATIENT_CLINIC_OR_DEPARTMENT_OTHER): Payer: Self-pay | Admitting: Obstetrics & Gynecology

## 2022-07-18 ENCOUNTER — Inpatient Hospital Stay (HOSPITAL_COMMUNITY): Payer: 59

## 2022-07-18 DIAGNOSIS — Z853 Personal history of malignant neoplasm of breast: Secondary | ICD-10-CM | POA: Diagnosis not present

## 2022-07-18 DIAGNOSIS — Z87891 Personal history of nicotine dependence: Secondary | ICD-10-CM | POA: Diagnosis not present

## 2022-07-18 DIAGNOSIS — Z885 Allergy status to narcotic agent status: Secondary | ICD-10-CM | POA: Diagnosis not present

## 2022-07-18 DIAGNOSIS — D649 Anemia, unspecified: Secondary | ICD-10-CM | POA: Diagnosis not present

## 2022-07-18 DIAGNOSIS — N950 Postmenopausal bleeding: Secondary | ICD-10-CM | POA: Diagnosis present

## 2022-07-18 DIAGNOSIS — D62 Acute posthemorrhagic anemia: Secondary | ICD-10-CM | POA: Diagnosis not present

## 2022-07-18 DIAGNOSIS — Z9013 Acquired absence of bilateral breasts and nipples: Secondary | ICD-10-CM | POA: Diagnosis not present

## 2022-07-18 DIAGNOSIS — Z8249 Family history of ischemic heart disease and other diseases of the circulatory system: Secondary | ICD-10-CM | POA: Diagnosis not present

## 2022-07-18 DIAGNOSIS — Z98891 History of uterine scar from previous surgery: Secondary | ICD-10-CM | POA: Diagnosis not present

## 2022-07-18 DIAGNOSIS — F32A Depression, unspecified: Secondary | ICD-10-CM | POA: Diagnosis present

## 2022-07-18 DIAGNOSIS — I252 Old myocardial infarction: Secondary | ICD-10-CM | POA: Diagnosis not present

## 2022-07-18 DIAGNOSIS — Z794 Long term (current) use of insulin: Secondary | ICD-10-CM | POA: Diagnosis not present

## 2022-07-18 DIAGNOSIS — T451X5A Adverse effect of antineoplastic and immunosuppressive drugs, initial encounter: Secondary | ICD-10-CM | POA: Diagnosis present

## 2022-07-18 DIAGNOSIS — W2201XA Walked into wall, initial encounter: Secondary | ICD-10-CM | POA: Diagnosis not present

## 2022-07-18 DIAGNOSIS — N9489 Other specified conditions associated with female genital organs and menstrual cycle: Secondary | ICD-10-CM | POA: Diagnosis present

## 2022-07-18 DIAGNOSIS — L7632 Postprocedural hematoma of skin and subcutaneous tissue following other procedure: Secondary | ICD-10-CM | POA: Diagnosis not present

## 2022-07-18 DIAGNOSIS — K219 Gastro-esophageal reflux disease without esophagitis: Secondary | ICD-10-CM | POA: Diagnosis present

## 2022-07-18 DIAGNOSIS — Z9889 Other specified postprocedural states: Secondary | ICD-10-CM

## 2022-07-18 DIAGNOSIS — Z79810 Long term (current) use of selective estrogen receptor modulators (SERMs): Secondary | ICD-10-CM | POA: Diagnosis not present

## 2022-07-18 DIAGNOSIS — I9581 Postprocedural hypotension: Secondary | ICD-10-CM | POA: Diagnosis not present

## 2022-07-18 DIAGNOSIS — I5022 Chronic systolic (congestive) heart failure: Secondary | ICD-10-CM | POA: Diagnosis present

## 2022-07-18 DIAGNOSIS — K91870 Postprocedural hematoma of a digestive system organ or structure following a digestive system procedure: Secondary | ICD-10-CM

## 2022-07-18 DIAGNOSIS — Z9851 Tubal ligation status: Secondary | ICD-10-CM | POA: Diagnosis not present

## 2022-07-18 DIAGNOSIS — Z923 Personal history of irradiation: Secondary | ICD-10-CM | POA: Diagnosis not present

## 2022-07-18 DIAGNOSIS — Z9641 Presence of insulin pump (external) (internal): Secondary | ICD-10-CM | POA: Diagnosis present

## 2022-07-18 DIAGNOSIS — I427 Cardiomyopathy due to drug and external agent: Secondary | ICD-10-CM | POA: Diagnosis present

## 2022-07-18 DIAGNOSIS — Z79899 Other long term (current) drug therapy: Secondary | ICD-10-CM | POA: Diagnosis not present

## 2022-07-18 DIAGNOSIS — E109 Type 1 diabetes mellitus without complications: Secondary | ICD-10-CM

## 2022-07-18 DIAGNOSIS — Z803 Family history of malignant neoplasm of breast: Secondary | ICD-10-CM | POA: Diagnosis not present

## 2022-07-18 DIAGNOSIS — N99840 Postprocedural hematoma of a genitourinary system organ or structure following a genitourinary system procedure: Secondary | ICD-10-CM | POA: Diagnosis present

## 2022-07-18 HISTORY — PX: LAPAROSCOPY: SHX197

## 2022-07-18 LAB — HEMOGLOBIN AND HEMATOCRIT, BLOOD
HCT: 33.1 % — ABNORMAL LOW (ref 36.0–46.0)
HCT: 28.6 % — ABNORMAL LOW (ref 36.0–46.0)
HCT: 22.1 % — ABNORMAL LOW (ref 36.0–46.0)
HCT: 22.9 % — ABNORMAL LOW (ref 36.0–46.0)
Hemoglobin: 10.8 g/dL — ABNORMAL LOW (ref 12.0–15.0)
Hemoglobin: 9.6 g/dL — ABNORMAL LOW (ref 12.0–15.0)
Hemoglobin: 7.4 g/dL — ABNORMAL LOW (ref 12.0–15.0)
Hemoglobin: 7.5 g/dL — ABNORMAL LOW (ref 12.0–15.0)

## 2022-07-18 LAB — TYPE AND SCREEN: Unit division: 0

## 2022-07-18 LAB — POCT I-STAT, CHEM 8
BUN: 26 mg/dL — ABNORMAL HIGH (ref 6–20)
Calcium, Ion: 1.19 mmol/L (ref 1.15–1.40)
Chloride: 98 mmol/L (ref 98–111)
Creatinine, Ser: 1 mg/dL (ref 0.44–1.00)
Glucose, Bld: 207 mg/dL — ABNORMAL HIGH (ref 70–99)
HCT: 26 % — ABNORMAL LOW (ref 36.0–46.0)
Hemoglobin: 8.8 g/dL — ABNORMAL LOW (ref 12.0–15.0)
Potassium: 5.4 mmol/L — ABNORMAL HIGH (ref 3.5–5.1)
Sodium: 136 mmol/L (ref 135–145)
TCO2: 26 mmol/L (ref 22–32)

## 2022-07-18 LAB — GLUCOSE, CAPILLARY
Glucose-Capillary: 189 mg/dL — ABNORMAL HIGH (ref 70–99)
Glucose-Capillary: 196 mg/dL — ABNORMAL HIGH (ref 70–99)
Glucose-Capillary: 196 mg/dL — ABNORMAL HIGH (ref 70–99)
Glucose-Capillary: 201 mg/dL — ABNORMAL HIGH (ref 70–99)
Glucose-Capillary: 213 mg/dL — ABNORMAL HIGH (ref 70–99)
Glucose-Capillary: 112 mg/dL — ABNORMAL HIGH (ref 70–99)
Glucose-Capillary: 157 mg/dL — ABNORMAL HIGH (ref 70–99)

## 2022-07-18 LAB — PROTIME-INR
INR: 1.1 (ref 0.8–1.2)
INR: 1.2 (ref 0.8–1.2)
Prothrombin Time: 13.8 seconds (ref 11.4–15.2)
Prothrombin Time: 15 seconds (ref 11.4–15.2)

## 2022-07-18 LAB — PREPARE RBC (CROSSMATCH)

## 2022-07-18 LAB — MRSA NEXT GEN BY PCR, NASAL: MRSA by PCR Next Gen: NOT DETECTED

## 2022-07-18 LAB — BPAM RBC
ISSUE DATE / TIME: 202404250124
ISSUE DATE / TIME: 202404252045

## 2022-07-18 LAB — BASIC METABOLIC PANEL
Anion gap: 12 (ref 5–15)
BUN: 24 mg/dL — ABNORMAL HIGH (ref 6–20)
CO2: 24 mmol/L (ref 22–32)
Calcium: 8.2 mg/dL — ABNORMAL LOW (ref 8.9–10.3)
Chloride: 101 mmol/L (ref 98–111)
Creatinine, Ser: 1.04 mg/dL — ABNORMAL HIGH (ref 0.44–1.00)
GFR, Estimated: >60 mL/min (ref >60)
Glucose, Bld: 182 mg/dL — ABNORMAL HIGH (ref 70–99)
Potassium: 4.3 mmol/L (ref 3.5–5.1)
Sodium: 137 mmol/L (ref 135–145)

## 2022-07-18 LAB — SURGICAL PATHOLOGY

## 2022-07-18 SURGERY — LAPAROSCOPY, DIAGNOSTIC
Anesthesia: General | Site: Abdomen

## 2022-07-18 MED ORDER — PROPOFOL 10 MG/ML IV BOLUS
INTRAVENOUS | Status: DC | PRN
  Administered 2022-07-18: 100 mg via INTRAVENOUS

## 2022-07-18 MED ORDER — OXYCODONE-ACETAMINOPHEN 5-325 MG PO TABS
1.0000 | ORAL_TABLET | Freq: Four times a day (QID) | ORAL | Status: DC | PRN
  Administered 2022-07-18: 1 via ORAL
  Filled 2022-07-18: qty 1

## 2022-07-18 MED ORDER — EPHEDRINE 5 MG/ML INJ
INTRAVENOUS | Status: AC
  Filled 2022-07-18: qty 5

## 2022-07-18 MED ORDER — HEMOSTATIC AGENTS (NO CHARGE) OPTIME
TOPICAL | Status: DC | PRN
  Administered 2022-07-18 (×2): 1 via TOPICAL

## 2022-07-18 MED ORDER — PHENYLEPHRINE HCL (PRESSORS) 10 MG/ML IV SOLN
INTRAVENOUS | Status: DC | PRN
  Administered 2022-07-18 (×2): 240 ug via INTRAVENOUS
  Administered 2022-07-18: 320 ug via INTRAVENOUS
  Administered 2022-07-18: 240 ug via INTRAVENOUS
  Administered 2022-07-18: 160 ug via INTRAVENOUS

## 2022-07-18 MED ORDER — ONDANSETRON HCL 4 MG/2ML IJ SOLN
INTRAMUSCULAR | Status: AC
  Filled 2022-07-18: qty 2

## 2022-07-18 MED ORDER — ROCURONIUM BROMIDE 10 MG/ML (PF) SYRINGE
PREFILLED_SYRINGE | INTRAVENOUS | Status: AC
  Filled 2022-07-18: qty 10

## 2022-07-18 MED ORDER — PHENYLEPHRINE HCL (PRESSORS) 10 MG/ML IV SOLN
INTRAVENOUS | Status: AC
  Filled 2022-07-18: qty 1

## 2022-07-18 MED ORDER — IOHEXOL 350 MG/ML SOLN
100.0000 mL | Freq: Once | INTRAVENOUS | Status: AC | PRN
  Administered 2022-07-18: 100 mL via INTRAVENOUS

## 2022-07-18 MED ORDER — SUCCINYLCHOLINE CHLORIDE 200 MG/10ML IV SOSY
PREFILLED_SYRINGE | INTRAVENOUS | Status: DC | PRN
  Administered 2022-07-18: 100 mg via INTRAVENOUS

## 2022-07-18 MED ORDER — PHENYLEPHRINE 80 MCG/ML (10ML) SYRINGE FOR IV PUSH (FOR BLOOD PRESSURE SUPPORT)
PREFILLED_SYRINGE | INTRAVENOUS | Status: AC
  Filled 2022-07-18: qty 20

## 2022-07-18 MED ORDER — PROPOFOL 10 MG/ML IV BOLUS
INTRAVENOUS | Status: AC
  Filled 2022-07-18: qty 20

## 2022-07-18 MED ORDER — SODIUM CHLORIDE 0.9 % IV SOLN: INTRAVENOUS | Status: DC | PRN

## 2022-07-18 MED ORDER — ALBUMIN HUMAN 5 % IV SOLN
INTRAVENOUS | Status: AC
  Filled 2022-07-18: qty 250

## 2022-07-18 MED ORDER — BUPIVACAINE HCL 0.25 % IJ SOLN
INTRAMUSCULAR | Status: AC
  Filled 2022-07-18: qty 1

## 2022-07-18 MED ORDER — EPHEDRINE SULFATE (PRESSORS) 50 MG/ML IJ SOLN
INTRAMUSCULAR | Status: DC | PRN
  Administered 2022-07-18 (×2): 10 mg via INTRAVENOUS

## 2022-07-18 MED ORDER — ROCURONIUM BROMIDE 10 MG/ML (PF) SYRINGE
PREFILLED_SYRINGE | INTRAVENOUS | Status: DC | PRN
  Administered 2022-07-18: 30 mg via INTRAVENOUS
  Administered 2022-07-18: 10 mg via INTRAVENOUS

## 2022-07-18 MED ORDER — SODIUM CHLORIDE 0.9% IV SOLUTION: Freq: Once | INTRAVENOUS | Status: AC

## 2022-07-18 MED ORDER — 0.9 % SODIUM CHLORIDE (POUR BTL) OPTIME
TOPICAL | Status: DC | PRN
  Administered 2022-07-18: 1000 mL

## 2022-07-18 MED ORDER — ALBUMIN HUMAN 5 % IV SOLN: INTRAVENOUS | Status: DC | PRN

## 2022-07-18 MED ORDER — SUGAMMADEX SODIUM 200 MG/2ML IV SOLN
INTRAVENOUS | Status: DC | PRN
  Administered 2022-07-18: 250 mg via INTRAVENOUS

## 2022-07-18 MED ORDER — INSULIN PUMP
SUBCUTANEOUS | Status: DC
  Administered 2022-07-18 (×2): 1 via SUBCUTANEOUS
  Administered 2022-07-18: 2.45 via SUBCUTANEOUS
  Administered 2022-07-18: 0.9 via SUBCUTANEOUS
  Administered 2022-07-19: 4.1 via SUBCUTANEOUS
  Administered 2022-07-19 (×3): 0.9 via SUBCUTANEOUS
  Filled 2022-07-18: qty 1

## 2022-07-18 MED ORDER — BUPIVACAINE HCL (PF) 0.25 % IJ SOLN
INTRAMUSCULAR | Status: DC | PRN
  Administered 2022-07-18: 8 mL

## 2022-07-18 MED ORDER — FENTANYL CITRATE PF 50 MCG/ML IJ SOSY: 25.0000 ug | PREFILLED_SYRINGE | INTRAMUSCULAR | Status: DC | PRN

## 2022-07-18 MED ORDER — MIDAZOLAM HCL 2 MG/2ML IJ SOLN
INTRAMUSCULAR | Status: AC
  Filled 2022-07-18: qty 2

## 2022-07-18 MED ORDER — FENTANYL CITRATE (PF) 250 MCG/5ML IJ SOLN
INTRAMUSCULAR | Status: AC
  Filled 2022-07-18: qty 5

## 2022-07-18 MED ORDER — FENTANYL CITRATE (PF) 250 MCG/5ML IJ SOLN
INTRAMUSCULAR | Status: DC | PRN
  Administered 2022-07-18: 100 ug via INTRAVENOUS
  Administered 2022-07-18: 50 ug via INTRAVENOUS
  Administered 2022-07-18 (×2): 100 ug via INTRAVENOUS
  Administered 2022-07-18: 50 ug via INTRAVENOUS
  Administered 2022-07-18: 100 ug via INTRAVENOUS

## 2022-07-18 MED ORDER — CHLORHEXIDINE GLUCONATE CLOTH 2 % EX PADS
6.0000 | MEDICATED_PAD | Freq: Every day | CUTANEOUS | Status: DC
  Administered 2022-07-18: 6 via TOPICAL

## 2022-07-18 MED ORDER — SODIUM CHLORIDE 0.9% IV SOLUTION: Freq: Once | INTRAVENOUS | Status: DC

## 2022-07-18 MED ORDER — AMISULPRIDE (ANTIEMETIC) 5 MG/2ML IV SOLN: 10.0000 mg | Freq: Once | INTRAVENOUS | Status: DC | PRN

## 2022-07-18 MED ORDER — SODIUM CHLORIDE 0.9 % IV SOLN
2.0000 g | Freq: Once | INTRAVENOUS | Status: AC
  Administered 2022-07-18: 2 g via INTRAVENOUS

## 2022-07-18 MED ORDER — DEXAMETHASONE SODIUM PHOSPHATE 10 MG/ML IJ SOLN
INTRAMUSCULAR | Status: AC
  Filled 2022-07-18: qty 1

## 2022-07-18 MED ORDER — LACTATED RINGERS IR SOLN
Status: DC | PRN
  Administered 2022-07-18: 2000 mL

## 2022-07-18 MED ORDER — MORPHINE SULFATE (PF) 2 MG/ML IV SOLN: 1.0000 mg | INTRAVENOUS | Status: DC | PRN

## 2022-07-18 MED ORDER — LIDOCAINE HCL (PF) 2 % IJ SOLN
INTRAMUSCULAR | Status: AC
  Filled 2022-07-18: qty 5

## 2022-07-18 MED ORDER — LACTATED RINGERS IV SOLN: INTRAVENOUS | Status: DC | PRN

## 2022-07-18 MED ORDER — ONDANSETRON HCL 4 MG/2ML IJ SOLN
INTRAMUSCULAR | Status: DC | PRN
  Administered 2022-07-18: 4 mg via INTRAVENOUS

## 2022-07-18 MED ORDER — PHENYLEPHRINE HCL-NACL 20-0.9 MG/250ML-% IV SOLN
INTRAVENOUS | Status: DC | PRN
  Administered 2022-07-18: 40 ug/min via INTRAVENOUS

## 2022-07-18 MED ORDER — ORAL CARE MOUTH RINSE: 15.0000 mL | OROMUCOSAL | Status: DC | PRN

## 2022-07-18 MED ORDER — SUCCINYLCHOLINE CHLORIDE 200 MG/10ML IV SOSY
PREFILLED_SYRINGE | INTRAVENOUS | Status: AC
  Filled 2022-07-18: qty 10

## 2022-07-18 MED ORDER — PANTOPRAZOLE SODIUM 40 MG IV SOLR
40.0000 mg | INTRAVENOUS | Status: DC
  Administered 2022-07-18 – 2022-07-19 (×2): 40 mg via INTRAVENOUS
  Filled 2022-07-18 (×2): qty 10

## 2022-07-18 MED ORDER — SODIUM CHLORIDE 0.9 % IV SOLN
INTRAVENOUS | Status: AC
  Filled 2022-07-18: qty 2

## 2022-07-18 MED ORDER — LIDOCAINE 2% (20 MG/ML) 5 ML SYRINGE
INTRAMUSCULAR | Status: DC | PRN
  Administered 2022-07-18: 40 mg via INTRAVENOUS

## 2022-07-18 SURGICAL SUPPLY — 53 items
ANTIFOG SOL W/FOAM PAD STRL (MISCELLANEOUS) ×1
APPLICATOR ARISTA FLEXITIP XL (MISCELLANEOUS) IMPLANT
BAG COUNTER SPONGE SURGICOUNT (BAG) IMPLANT
BAG URINE DRAIN 2000ML AR STRL (UROLOGICAL SUPPLIES) ×1 IMPLANT
BLADE SURG 15 STRL LF DISP TIS (BLADE) ×1 IMPLANT
BLADE SURG 15 STRL SS (BLADE) ×1
CABLE HIGH FREQUENCY MONO STRZ (ELECTRODE) IMPLANT
CLEANER TIP ELECTROSURG 2X2 (MISCELLANEOUS) ×1 IMPLANT
COVER BACK TABLE 60X90IN (DRAPES) ×1 IMPLANT
COVER MAYO STAND STRL (DRAPES) ×1 IMPLANT
COVER SURGICAL LIGHT HANDLE (MISCELLANEOUS) ×1 IMPLANT
DERMABOND ADVANCED .7 DNX12 (GAUZE/BANDAGES/DRESSINGS) IMPLANT
ELECT REM PT RETURN 15FT ADLT (MISCELLANEOUS) ×1 IMPLANT
GAUZE SPONGE 4X4 12PLY STRL (GAUZE/BANDAGES/DRESSINGS) IMPLANT
GLOVE BIO SURGEON STRL SZ 6.5 (GLOVE) IMPLANT
GLOVE BIO SURGEON STRL SZ7 (GLOVE) IMPLANT
GLOVE BIO SURGEON STRL SZ8 (GLOVE) IMPLANT
GLOVE BIOGEL PI IND STRL 6.5 (GLOVE) IMPLANT
GLOVE BIOGEL PI IND STRL 7.0 (GLOVE) IMPLANT
GLOVE BIOGEL PI IND STRL 7.5 (GLOVE) IMPLANT
GLOVE BIOGEL PI IND STRL 8 (GLOVE) IMPLANT
GLOVE ECLIPSE 6.5 STRL STRAW (GLOVE) ×1 IMPLANT
GOWN STRL REUS W/TWL LRG LVL3 (GOWN DISPOSABLE) IMPLANT
GOWN STRL REUS W/TWL XL LVL3 (GOWN DISPOSABLE) IMPLANT
GRASPER SUT TROCAR 14GX15 (MISCELLANEOUS) IMPLANT
HEMOSTAT ARISTA ABSORB 3G PWDR (HEMOSTASIS) IMPLANT
HIBICLENS CHG 4% 4OZ BTL (MISCELLANEOUS) IMPLANT
IRRIG SUCT STRYKERFLOW 2 WTIP (MISCELLANEOUS) ×2
IRRIGATION SUCT STRKRFLW 2 WTP (MISCELLANEOUS) ×1 IMPLANT
KIT TURNOVER KIT A (KITS) IMPLANT
NDL HYPO 25X1 1.5 SAFETY (NEEDLE) ×1 IMPLANT
NDL INSUFFLATION 14GA 120MM (NEEDLE) IMPLANT
NEEDLE HYPO 25X1 1.5 SAFETY (NEEDLE) ×1 IMPLANT
NEEDLE INSUFFLATION 14GA 120MM (NEEDLE) ×1 IMPLANT
NS IRRIG 1000ML POUR BTL (IV SOLUTION) ×1 IMPLANT
POWDER SURGICEL 3.0 GRAM (HEMOSTASIS) IMPLANT
SET TUBE SMOKE EVAC HIGH FLOW (TUBING) ×1 IMPLANT
SHEET LAVH (DRAPES) ×1 IMPLANT
SLEEVE Z-THREAD 5X100MM (TROCAR) IMPLANT
SOLUTION ANTFG W/FOAM PAD STRL (MISCELLANEOUS) ×1 IMPLANT
SPONGE T-LAP 4X18 ~~LOC~~+RFID (SPONGE) ×1 IMPLANT
STRIP CLOSURE SKIN 1/2X4 (GAUZE/BANDAGES/DRESSINGS) ×1 IMPLANT
SUT MNCRL AB 4-0 PS2 18 (SUTURE) IMPLANT
SUT VIC AB 3-0 PS2 18 (SUTURE) ×1
SUT VIC AB 3-0 PS2 18XBRD (SUTURE) ×1 IMPLANT
SUT VICRYL 0 UR6 27IN ABS (SUTURE) ×1 IMPLANT
SYR 20ML LL LF (SYRINGE) ×1 IMPLANT
SYSTEM CARTER THOMASON II (TROCAR) IMPLANT
TIP ENDOSCOPIC SURGICEL (TIP) IMPLANT
TOWEL OR 17X26 10 PK STRL BLUE (TOWEL DISPOSABLE) ×1 IMPLANT
TROCAR 11X100 Z THREAD (TROCAR) ×1 IMPLANT
TROCAR Z-THREAD OPTICAL 5X100M (TROCAR) ×2 IMPLANT
UNDERPAD 30X36 HEAVY ABSORB (UNDERPADS AND DIAPERS) ×1 IMPLANT

## 2022-07-18 NOTE — Anesthesia Preprocedure Evaluation (Addendum)
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 - normal EF, no valvulopathy    Neuro/Psych  PSYCHIATRIC DISORDERS  Depression    negative neurological ROS     GI/Hepatic ,GERD  Controlled,,(+)     substance abuse  marijuana use  Endo/Other  diabetes, Type 1, Insulin Dependent   Insulin pump   Renal/GU negative Renal ROS     Musculoskeletal negative musculoskeletal ROS (+)    Abdominal   Peds  Hematology  (+) Blood dyscrasia, anemia Post op drop in Hgb with pelvic hematoma   Anesthesia Other Findings   Reproductive/Obstetrics  s/p tubal ligation                              Anesthesia Physical Anesthesia Plan  ASA: 3 and emergent  Anesthesia Plan: General   Post-op Pain Management:    Induction: Intravenous and Rapid sequence  PONV Risk Score and Plan: 4 or greater and Treatment may vary due to age or medical condition, Ondansetron, Midazolam and Scopolamine patch - Pre-op  Airway Management Planned: Oral ETT  Additional Equipment: None  Intra-op Plan:   Post-operative Plan: Extubation in OR  Informed Consent: I have reviewed the patients History and Physical, chart, labs and discussed the procedure including the risks, benefits and alternatives for the proposed anesthesia with the patient or authorized representative who has indicated his/her understanding and acceptance.      Dental advisory given  Plan Discussed with: CRNA and Anesthesiologist  Anesthesia Plan Comments:         Anesthesia Quick Evaluation

## 2022-07-18 NOTE — TOC Progression Note (Signed)
Transition of Care [facility]) - Progression Note    Patient Details  Name: Stephanie Holden MRN: [id number] Date of Birth: 02/19/1976  Transition of Care [facility]) CM/SW Contact  Coralyn Helling, [place] Phone Number: 07/18/2022, 8:55 AM  Clinical Narrative:      Transition of Care (TOC) Screening Note   Patient Details  Name: Stephanie Holden Date of Birth: [birth year]   [facility]) CM/SW Contact:    Coralyn Helling, LCSW Phone Number: 07/18/2022, 8:55 AM    Transition of Care Department [facility]) has reviewed patient and no TOC needs have been identified at this time. We will continue to monitor patient advancement through interdisciplinary progression rounds. If new patient transition needs arise, please place a TOC consult.         Expected Discharge Plan and Services                                               Social Determinants of Health (SDOH) Interventions SDOH Screenings   Transportation Needs: No Transportation Needs (12/22/2017)  Depression (PHQ2-9): Low Risk  (04/15/2022)  Tobacco Use: Medium Risk (07/18/2022)    Readmission Risk Interventions     No data to display

## 2022-07-18 NOTE — Progress Notes (Signed)
On arrival to shift, MD on unit discussing plan of care with nursing staff. Patient continued with complaints of left lower Abd pain, she had low urine output via foley cath, and her blood pressure continued to run soft. MD gave order for repeat lab work, pain and nausea medications, as well as an order to increase her IVF. MD also gave order for a CT angiogram of her pelvis and Abd. After discussing those results with patient and her husband, the decision was made to take the patient back to the OR. MD and nursing staff coordinated with OR staff at [facility] to get patient transferred over to prep for surgery.

## 2022-07-18 NOTE — H&P (Signed)
Stephanie Holden is an 47 y.o. female G2P2 MWF who underwent TLH/BSO/cystoscopy this morning.  Surgery was uneventful.  She has <100cc EBL and pelvic was irrigated and watched under lower pressures before leaving the operating room today.  Also, Arista was placed along the pedicles.  However, she experienced three orthostatic events throughout the day after surgery was completed.  The first resulted in her have a full syncopal episode with hitting her head on the wall.  Head CT was done and was negative.  Hb at that time was 12.6.  CMP with normal creatinine and normal electrolytes.  BNP was obtained due to hx of heart failure with chemotherapy.  This was mildly elevated.  EGK with mild LVH changes.  Spoke with cardiology who felt this was not worrisome.  Foley catheter was placed.  Over the next two hours, urine output was adequate but not what I would normally expect.  Pulse was 90-low 100's and blood pressure 100s - 110s systolic.  CT/angiogram abd/pelvis was obtained.  14cm hematoma noted.  At this pt, pt was also very uncomfortable with bilateral shoulder pain as well as pelvic, low back and LLQ pain.  With any head elevation, she felt lightheaded.    Findings discussed with pt.  Fluid management with monitoring serial hemoglobin (and possible return to OR) vs return to OR for laparoscopic and hematoma evacuation was discussed.  Pt desired to proceed with surgery.  Risks of surgical injury to pelvic structures, not being able to find post op bleed, need for blood transfusion, need for additional procedures reviewed.  She and spouse decided to proceed with laparoscopy and evacuation of clot.  Pertinent Gynecological History: Blood transfusions: none Sexually transmitted diseases: no past history Previous GYN Procedures:  TLH with BSO/cystoscopy today   Last mammogram:  n/a with hx of breast cancer in 2019   Last pap: normal Date: 02/06/2022 OB History: G2, P2   Menstrual History: No LMP recorded.  (Menstrual status: Chemotherapy).    Past Medical History:  Diagnosis Date   Cancer    Breast cancer - right   CHF (congestive heart failure) 2020   chemo induced cardiomyopathy, 12/21/21 echocardiogram EF 55-60%   Constipation    resolved 07/09/22 per pt   Depression    Follows w/ Dr. Pamelia Hoit, oncologist.   Genetic testing 05/27/2017   Breast/GYN panel (23 genes) @ [facility] - No pathogenic mutations detected   GERD (gastroesophageal reflux disease)    resolved per patient as of 07/09/2022   History of chemotherapy 2019   Follows w/ Dr. Pamelia Hoit @ [facility].   History of radiation therapy 12/30/17- 02/10/18   Right chest wall/ 50 Gy in 25 fractions, Right SCV, PAB nodes/ 50 Gy in 25 fractions, Right Chest wall boost 10 gy in 5 fractions.    Insulin dependent type 1 diabetes mellitus    Follows w/ Dr. Darral Dash, endocronologist.   Insulin pump in place    Pleural effusion 01/13/2018   Pneumonia 01/12/2018   PONV (postoperative nausea and vomiting)    one time per pt   Stenosing tenosynovitis of thumb 06/2014   right   Thyroid goiter    Wears contact lenses    Wears glasses     Past Surgical History:  Procedure Laterality Date   AXILLARY LYMPH NODE DISSECTION Right 06/26/2017   Procedure: AXILLARY LYMPH NODE DISSECTION;  Surgeon: Almond Lint, MD;  Location: [facility];  Service: General;  Laterality: Right;   CARPAL TUNNEL RELEASE Bilateral  10/28/2013   Procedure: BILATERAL CARPAL TUNNEL RELEASE;  Surgeon: Cindee Salt, MD;  Location: [facility];  Service: Orthopedics;  Laterality: Bilateral;   CESAREAN SECTION  2000; 08/20/2001   DILATION AND CURETTAGE OF UTERUS  1998   ENDOMETRIAL BIOPSY  02/28/2022   benign   ESSURE TUBAL LIGATION  2005   failed, tubal puncture   LAPAROSCOPIC TUBAL LIGATION  04/02/2007   removal Essure   LAPAROSCOPIC UNILATERAL SALPINGECTOMY  2009   MASS EXCISION Left 03/11/2018   Procedure: EXCISION LEFT CHEST WALL MASS;  Surgeon:  Almond Lint, MD;  Location: [facility];  Service: General;  Laterality: Left;   MASTECTOMY W/ SENTINEL NODE BIOPSY Bilateral 06/05/2017   Procedure: BILATERAL MASTECTOMIES  WITH RIGHT SENTINEL LYMPH NODE BIOPSY;  Surgeon: Almond Lint, MD;  Location: [facility];  Service: General;  Laterality: Bilateral;   PORT-A-CATH REMOVAL N/A 03/11/2018   Procedure: REMOVAL PORT-A-CATH;  Surgeon: Almond Lint, MD;  Location: [facility];  Service: General;  Laterality: N/A;   PORTACATH PLACEMENT Left 06/26/2017   Procedure: INSERTION PORT-A-CATH;  Surgeon: Almond Lint, MD;  Location: [facility];  Service: General;  Laterality: Left;   TRIGGER FINGER RELEASE Left 04/12/2014   Procedure: RELEASE A-1 PULLEY LEFT THUMB;  Surgeon: Cindee Salt, MD;  Location: [facility];  Service: Orthopedics;  Laterality: Left;   TRIGGER FINGER RELEASE Right 07/19/2014   Procedure: RELEASE TRIGGER FINGER/A-1 PULLEY RIGHT THUMB;  Surgeon: Cindee Salt, MD;  Location: [facility];  Service: Orthopedics;  Laterality: Right;    Family History  Problem Relation Age of Onset   Hypertension Mother    Hypertension Father    Breast cancer Other 55       paternal grandfather's niece    Social History:  reports that she quit smoking about 21 years ago. Her smoking use included cigarettes. She has a 0.26 pack-year smoking history. She has never used smokeless tobacco. She reports current alcohol use. She reports current drug use. Drug: Marijuana.  Allergies:  Allergies  Allergen Reactions   Dilaudid [Hydromorphone Hcl] Itching and Other (See Comments)    UNCONTROLLABLE CRYING    Facility-Administered Medications Prior to Admission  Medication Dose Route Frequency Provider Last Rate Last Admin   acetaminophen (TYLENOL) tablet 650 mg  650 mg Oral Q6H PRN Chrzanowski, Jami B, NP       Medications Prior to Admission  Medication Sig Dispense Refill Last Dose   acetaminophen (TYLENOL) 500 MG tablet Take 500 mg by mouth  every 6 (six) hours as needed.   07/16/2022   amphetamine-dextroamphetamine (ADDERALL XR) 20 MG 24 hr capsule Take 20 mg by mouth every morning.   07/16/2022   carvedilol (COREG) 6.25 MG tablet TAKE 1 TABLET BY MOUTH TWICE DAILY WITH A MEAL 180 tablet 3 07/16/2022 at 2100   furosemide (LASIX) 40 MG tablet TAKE 1 TABLET BY MOUTH EVERY DAY AS NEEDED FOR FLUID/EDEMA 90 tablet 3 07/16/2022   HUMALOG 100 UNIT/ML injection Inject into the skin. Via Insulin pump / average 36 units per day per pt - at basal 0.45 units/hr   07/17/2022   KLOR-CON M20 20 MEQ tablet TAKE 1/2 TABLET BY MOUTH EVERY DAY AS NEEDED WITH FUROSEMIDE 45 tablet 2 07/16/2022   LORazepam (ATIVAN) 0.5 MG tablet TAKE 1 TABLET BY MOUTH AT BEDTIME AS NEEDED FOR ANXIETY 30 tablet 3 Past Month   Multiple Vitamin (MULTIVITAMIN WITH MINERALS) TABS tablet Take 1 tablet by mouth daily.   07/16/2022   pravastatin (  PRAVACHOL) 20 MG tablet Take 20 mg by mouth daily.   07/16/2022   Probiotic Product (PROBIOTIC BLEND PO) Take by mouth.   07/16/2022   sacubitril-valsartan (ENTRESTO) 97-103 MG Take 1 tablet by mouth 2 (two) times daily. 180 tablet 3 07/15/2022   spironolactone (ALDACTONE) 25 MG tablet TAKE 1/2 TABLET BY MOUTH EVERY DAY (Patient taking differently: daily.) 45 tablet 3 07/16/2022   venlafaxine XR (EFFEXOR-XR) 75 MG 24 hr capsule TAKE 1 CAPSULE BY MOUTH DAILY WITH BREAKFAST (Patient taking differently: Take 75 mg by mouth daily with breakfast. Takes 37.5 mg daily) 90 capsule 3 07/16/2022   Continuous Blood Gluc Sensor (DEXCOM G6 SENSOR) MISC Apply topically every 3 (three) days.      CONTOUR TEST test strip USE TO TEST BLOOD SUGARS 6 TIMES DAILY ON PUMP DX  E10.9      Insulin Disposable Pump (OMNIPOD 5 G6 POD, GEN 5,) MISC SMARTSIG:SUB-Q Every 3 Days      Probiotic Product (PROBIOTIC PO) Take by mouth daily. (Patient not taking: Reported on 07/17/2022)   Not Taking    Review of Systems  Blood pressure 117/75, pulse 94, temperature 98.2 F (36.8 C),  resp. rate 14, height 5\' 2"  (1.575 m), weight 65.5 kg, SpO2 96 %. Physical Exam Constitutional:      Comments: Pale  Cardiovascular:     Rate and Rhythm: Tachycardia present.  Pulmonary:     Breath sounds: Normal breath sounds.  Abdominal:     Tenderness: There is abdominal tenderness.  Skin:    General: Skin is warm.  Neurological:     General: No focal deficit present.     Mental Status: She is alert.  Psychiatric:        Mood and Affect: Mood normal.     Results for orders placed or performed during the hospital encounter of 07/17/22 (from the past 24 hour(s))  Pregnancy, urine POC     Status: None   Collection Time: 07/17/22  5:57 AM  Result Value Ref Range   Preg Test, Ur NEGATIVE NEGATIVE  ABO/Rh     Status: None   Collection Time: 07/17/22  6:45 AM  Result Value Ref Range   ABO/RH(D)      O POS Performed at [facility], [address], [place], [place] [address]   Glucose, capillary     Status: Abnormal   Collection Time: 07/17/22  6:45 AM  Result Value Ref Range   Glucose-Capillary 150 (H) 70 - 99 mg/dL  Glucose, capillary     Status: Abnormal   Collection Time: 07/17/22 10:17 AM  Result Value Ref Range   Glucose-Capillary 192 (H) 70 - 99 mg/dL  Glucose, capillary     Status: Abnormal   Collection Time: 07/17/22 12:59 PM  Result Value Ref Range   Glucose-Capillary 250 (H) 70 - 99 mg/dL   Comment 1 Notify RN   Glucose, capillary     Status: Abnormal   Collection Time: 07/17/22  2:17 PM  Result Value Ref Range   Glucose-Capillary 227 (H) 70 - 99 mg/dL  CBC     Status: Abnormal   Collection Time: 07/17/22  2:45 PM  Result Value Ref Range   WBC 11.5 (H) 4.0 - 10.5 K/uL   RBC 4.16 3.87 - 5.11 MIL/uL   Hemoglobin 12.6 12.0 - 15.0 g/dL   HCT 60.4 54.0 - 98.1 %   MCV 91.3 80.0 - 100.0 fL   MCH 30.3 26.0 - 34.0 pg   MCHC  33.2 30.0 - 36.0 g/dL   RDW 69.6 29.5 - 28.4 %   Platelets 260 150 - 400 K/uL   nRBC 0.0 0.0 - 0.2 %  Basic metabolic  panel     Status: Abnormal   Collection Time: 07/17/22  7:07 PM  Result Value Ref Range   Sodium 136 135 - 145 mmol/L   Potassium 4.8 3.5 - 5.1 mmol/L   Chloride 101 98 - 111 mmol/L   CO2 26 22 - 32 mmol/L   Glucose, Bld 213 (H) 70 - 99 mg/dL   BUN 25 (H) 6 - 20 mg/dL   Creatinine, Ser 1.32 0.44 - 1.00 mg/dL   Calcium 8.5 (L) 8.9 - 10.3 mg/dL   GFR, Estimated >44 >01 mL/min   Anion gap 9 5 - 15  Brain natriuretic peptide     Status: Abnormal   Collection Time: 07/17/22  7:07 PM  Result Value Ref Range   B Natriuretic Peptide 113.5 (H) 0.0 - 100.0 pg/mL  Hemoglobin and hematocrit, blood     Status: Abnormal   Collection Time: 07/17/22  9:10 PM  Result Value Ref Range   Hemoglobin 10.9 (L) 12.0 - 15.0 g/dL   HCT 02.7 (L) 25.3 - 66.4 %  Prepare RBC (crossmatch)     Status: None   Collection Time: 07/18/22  1:10 AM  Result Value Ref Range   Order Confirmation      ORDER PROCESSED BY BLOOD BANK Performed at [facility], [address] [place], [place] [address]     CT Angio Abd/Pel w/ and/or w/o  Result Date: 07/17/2022 CLINICAL DATA:  Abdominal aortic aneurysm (AAA), post-op post op hysterectomy hypovolemia, syncope EXAM: CTA ABDOMEN AND PELVIS WITHOUT AND WITH CONTRAST TECHNIQUE: Multidetector CT imaging of the abdomen and pelvis was performed using the standard protocol during bolus administration of intravenous contrast. Multiplanar reconstructed images and MIPs were obtained and reviewed to evaluate the vascular anatomy. RADIATION DOSE REDUCTION: This exam was performed according to the departmental dose-optimization program which includes automated exposure control, adjustment of the mA and/or kV according to patient size and/or use of iterative reconstruction technique. CONTRAST:  OMNIPAQUE IOHEXOL 350 MG/ML SOLN COMPARISON:  None Available. FINDINGS: VASCULAR Aorta: Normal caliber aorta without aneurysm, dissection, vasculitis or significant stenosis.  Celiac: Patent without evidence of aneurysm, dissection, vasculitis or significant stenosis. SMA: Patent without evidence of aneurysm, dissection, vasculitis or significant stenosis. Renals: Both renal arteries are patent without evidence of aneurysm, dissection, vasculitis, fibromuscular dysplasia or significant stenosis. IMA: Patent without evidence of aneurysm, dissection, vasculitis or significant stenosis. Inflow: Patent without evidence of aneurysm, dissection, vasculitis or significant stenosis. Proximal Outflow: Bilateral common femoral and visualized portions of the superficial and profunda femoral arteries are patent without evidence of aneurysm, dissection, vasculitis or significant stenosis. Veins: No obvious venous abnormality within the limitations of this arterial phase study. Review of the MIP images confirms the above findings. NON-VASCULAR Lower chest: No acute findings Hepatobiliary: No focal hepatic abnormality. Gallbladder unremarkable. Pancreas: No focal abnormality or ductal dilatation. Spleen: No focal abnormality.  Normal size. Adrenals/Urinary Tract: No adrenal abnormality. No focal renal abnormality. No stones or hydronephrosis. Urinary bladder is unremarkable. Stomach/Bowel: Stomach, large and small bowel grossly unremarkable. Lymphatic: No adenopathy Reproductive: Changes of hysterectomy. Large hematoma the pelvis measuring 14.1 x 11.4 x 8.9 cm. No active extravasation of contrast seen. Other: In addition to the large pelvic hematoma, there is moderate free fluid in the abdomen and pelvis and moderate to  large free air. Musculoskeletal: No acute bony abnormality. IMPRESSION: VASCULAR Aorta and branch vessels unremarkable. No evidence of active contrast extravasation. NON-VASCULAR Changes of hysterectomy. Large pelvic hematoma measuring up to 14 cm in craniocaudal length. This is also accompanied by moderate free fluid throughout the abdomen and pelvis and moderate to large amount of free  air. These results were called by telephone at the time of interpretation on 07/17/2022 at 11:42 pm to provider [facility] , who verbally acknowledged these results. Electronically Signed   By: Charlett Nose M.D.   On: 07/17/2022 23:42   CT HEAD WO CONTRAST ( )  Result Date: 07/17/2022 CLINICAL DATA:  Head trauma, coagulopathy (Age 65-64y) fall after ambulation post op, hit head on wall, given pre op lovenox EXAM: CT HEAD WITHOUT CONTRAST TECHNIQUE: Contiguous axial images were obtained from the base of the skull through the vertex without intravenous contrast. RADIATION DOSE REDUCTION: This exam was performed according to the departmental dose-optimization program which includes automated exposure control, adjustment of the mA and/or kV according to patient size and/or use of iterative reconstruction technique. COMPARISON:  None Available. FINDINGS: Brain: No evidence of acute infarction, hemorrhage, hydrocephalus, extra-axial collection or mass lesion/mass effect. Vascular: No hyperdense vessel. Skull: No acute fracture. Sinuses/Orbits: Clear sinuses.  No acute orbital findings. Other: No mastoid effusions. IMPRESSION: No evidence of acute intracranial abnormality. Electronically Signed   By: Feliberto Harts M.D.   On: 07/17/2022 15:39    Assessment/Plan: 47 yo G2P2 MWF with pelvic hematoma after TLH/BSO, cystoscopy today here for laparoscopic exploration and clot evacuation as well as treatment of any bleeding.  Jerene Bears 07/18/2022, 1:29 AM

## 2022-07-18 NOTE — Progress Notes (Signed)
Day of Surgery Procedure(s) (LRB): OPERATIVE LAPAROSCOPIC EVACUATION OF POST OPERATIVE HEMATOMA (N/A)  Subjective: Patient reports minimal pain.  Has had liquids.  No nausea.  Sat up in bed for several hours today.  Feels much better.  Objective: I have reviewed patient's vital signs, intake and output, medications, and labs. Vitals:   07/18/22 1800 07/18/22 1847  BP: (!) 107/48   Pulse: 94   Resp: 18   Temp:  98.8 F (37.1 C)  SpO2: 97%    Hb 10.8 @ 5:22, Hb 9.6 :34, Hb 7.4 :11  General: alert and no distress Resp: clear to auscultation bilaterally Cardio: regular rate and rhythm, S1, S2 normal, no murmur, click, rub or gallop GI: incision: clean, dry, and intact and +BS with soft abdomen, no rebound or guarding, non distended Extremities: extremities normal, atraumatic, no cyanosis or edema Vaginal Bleeding: none  Assessment: s/p Procedure(s): OPERATIVE LAPAROSCOPIC EVACUATION OF POST OPERATIVE HEMATOMA (N/A):  Pt appears clinically stable but hemoglobin is not stable  Plan: Have discussed possible intervention by IR with gelfoam placement.  With negative bleeding noted on angiogram, radiology feels this has more risks than benefits.  Recommended repeating imaging.  Will proceed with transfusion of 1 unit PRBCs.     LOS: 0 days    Jerene Bears, MD 07/18/2022, 7:18 PM

## 2022-07-18 NOTE — Op Note (Addendum)
07/18/2022  4:23 AM  PATIENT:  Stephanie Holden  47 y.o. female  PRE-OPERATIVE DIAGNOSIS:  ORTHOSTATIC, POSTOPERATIVE PELVIC HEMATOMA  POST-OPERATIVE DIAGNOSIS:  ORTHOSTATIC, POSTOPERATIVE PELVIC HEMATOMA  PROCEDURE:  Procedure(s): OPERATIVE LAPAROSCOPIC EVACUATION OF POST OPERATIVE HEMATOMA  SURGEON:  Stephanie Holden  ASSISTANTS: Stephanie Holden.  An experienced assistant was required given the standard of surgical care given the complexity of the case.  This assistant was needed for exposure, dissection, suctioning, retraction, instrument exchange and for overall help during the procedure.  RNFA help was also unavailable.  ANESTHESIA:   general  ESTIMATED BLOOD LOSS: 700 mL  BLOOD ADMINISTERED:1 Unit PRBC  FLUIDS: 1700cc LR, 500cc NS, 250 albumin  UOP: 200 cc UOP  SPECIMEN:  none  DISPOSITION OF SPECIMEN:  N/A  FINDINGS: large pelvic clot with some blood,   DESCRIPTION OF OPERATION: Patient is taken to the operating room. She is placed in the supine position. She is a running IV in place. Informed consent was present on the chart. SCDs on her lower extremities and functioning properly. Foley catheter was in place and draining urine.  General endotracheal anesthesia was administered by the anesthesia staff without difficulty. Stephanie Holden, anesthesia, oversaw case. Patient was positioned in the low lithotomy position in [place] stirrups. right arm was tucked by the side.  Left arm was out on arm board for IV access.   Time out performed.    Clora prep was then used to prep the abdomen and Hibiclens was used to prep the inner thighs, perineum and vagina. I did the vaginal prep.  Minimal bleeding was noted on the first vaginal prep sponge.    Once 3 minutes had past the patient was draped in a normal standard fashion.  Dermabond from umbilical incision and RLQ and LLQ incisions were removed.  Incision sutures were cut and incisions opened.    The umbilicus was everted.  A  Veress needle was obtained. Syringe of sterile saline was placed on a open Veress needle.  With the abdomen elevated, the Veress needle was passed into the umbilicus until the pop was heard and then fluid started to drip.  Then low flow CO2 gas was attached the needle and the pneumoperitoneum was achieved without difficulty. Once pneumoperitoneum was achieved, a 5 millimeter non-bladed Optiview trocar and port were passed directly to the abdomen. The laparoscope was then used to confirm intraperitoneal placement.   Large pelvic clot was seen in addition to free blood in the peri-colic gutters.   Prior RLQ and LLQ incisions were used.  A 10mm port was placed in the RLQ port and a 5mm incision was placed in the LLQ incision.    Using a large suction irrigator, clot and blood were removed.  Pt was placed in Trendelenburg positioning.  The IP ligament pedicles were easily seen and were hemostatic.  The vaginal cuff was hemostatic.  There was a small amount of oozing from the bladder flap.  She did have hx of two prior cesarean sections so there was a little more scar tissue present with doing the hysterectomy earlier in the day.  This area was cauterized superficially with monopolar cautery.  There was no active or obvious bleeding.  Surgicel was then placed along the cuff and left for four minutes.  Then this was irrigated and removed.  No significant bleeding was noted.  Pressures from Co2 gas were lowered.  One small area of bleeding was seen and made hemostatic with cautery.  This was  monitored for several minutes and then arista was placed along the pedicles and vaginal cuff.     At this point the procedure was completed.  The remaining instruments were removed.  The ports (except the suprapubic port) were removed under direct visualization of the laparoscope and the pneumoperitoneum was relieved.  The patient was taken out of Trendelenburg positioning.  Several deep breaths were given to the patient's trying  to any gas the abdomen and finally the suprapubic port was removed.  The RLQ port was closed with a fascial stitch using the [employer] device.  The skin was then closed with subcuticular stitches of 3-0 Vicryl. The skin was cleansed Dermabond was applied. Attention was then turned the vagina and the cuff was inspected. No bleeding was noted.   The foley was left to SD.  Sponge, lap, needle, instrument counts were correct x2. Patient tolerated the procedure very well. She was awakened from anesthesia, extubated and taken to recovery in stable condition.    COUNTS:  YES  PLAN OF CARE: Transfer to PACU

## 2022-07-18 NOTE — Anesthesia Procedure Notes (Signed)
Procedure Name: Intubation Date/Time: 07/18/2022 1:34 AM  Performed by: Minerva Ends, CRNAPre-anesthesia Checklist: Patient identified, Emergency Drugs available, Suction available and Patient being monitored Patient Re-evaluated:Patient Re-evaluated prior to induction Oxygen Delivery Method: Circle System Utilized Preoxygenation: Pre-oxygenation with 100% oxygen Induction Type: IV induction, Rapid sequence and Cricoid Pressure applied Ventilation: Mask ventilation without difficulty Laryngoscope Size: Miller and 2 Grade View: Grade I Tube type: Oral Number of attempts: 1 Airway Equipment and Method: Stylet and Oral airway Placement Confirmation: ETT inserted through vocal cords under direct vision, positive ETCO2 and breath sounds checked- equal and bilateral Secured at: 21 cm Tube secured with: Tape Dental Injury: Teeth and Oropharynx as per pre-operative assessment  Comments: Smooth IV induction Fitzgerald-- intubation AM CRNA atraumatic-- teeth and mouth as preop-- bilat BS Fiztergerald

## 2022-07-18 NOTE — Progress Notes (Signed)
Phone call from Dr. Hyacinth Meeker verbal order given to do PT/INR.

## 2022-07-18 NOTE — Progress Notes (Signed)
IR brief note   Spoke with surgeon Dr. Hyacinth Meeker regarding this patient who is POD1 s/p total laparoscopic hysterectomy with return to OR for washout of pelvic hematoma. Since her washout in the early hours of the AM, her Hgb has trended from 10.8 to 7.4 over the course of ~12 hours (0500 to 1800) with last pRBC transfusion x1 at 0200 recorded. CTA last yesterday PM before pelvic washout demonstrated no active arterial bleeding.   Her vitals remain WNL per report, and she is alert and oriented. In the setting of a relative slow drop in hemoglobin and negative CTA, bleed is either venous or slow enough that it would be angiographically occult. Given post surgical abdomen with vaginal cuff, empiric Gelfoam administration of the bilateral internal iliac arteries would be inadvisable due to increased risk of infection and would dehiscence, in a patient that is otherwise hemodynamically stable at the time of this note.   Recommendations: Repeat CTA abd/pel since patient has undergone washout since last imaging performed  Continue to trend Hgb; IR is available to intervene if clinical situation changes    Olive Bass, MD  Vascular and Interventional Radiology 07/18/2022 7:15 PM

## 2022-07-18 NOTE — Consult Note (Signed)
Initial Inpatient Consultation Note    Patient: Stephanie Holden:[id number] DOB: Aug 12, 1975 PCP: Gaspar Garbe, MD DOA: 07/17/2022 DOS: the patient was seen and examined on 07/18/2022 Primary service: Jerene Bears, MD  Referring physician: Valentina Shaggy, MD Reason for consult: Medical Management  Assessment/Plan: Stephanie Holden is a 47 y.o. female with past medical history of Type I DM on insulin pump, breast cancer s/p bilateral mastectomy 2019 and chemotherapy on surveillance and tamoxifen as well as chemotherapy-induced cardiomyopathy that has recovered with normal EF on last echo in 11/2021 who was admitted by Dr. Valentina Shaggy for scheduled TAH/SBO on 4/24 with postoperative course complicated by pelvic hematoma now s/p hematoma evacuation and blood transfusion in the early morning hours of 4/25. TRH consultation was requested due to medical complexity and assistance in management. - agree with diet as tolerated, continue insulin pump - continue IV fluids and avoid nephrotoxins; BP stable so will slow fluids down and discontinue after another 24 hours - agree with hold Entresto today due to mild AKI which is likely from hypotension yesterday, recheck Cr with AM labs - will add on INR - currently no s/s of bleeding and no indication for further transfusion - check Hb today at 13:00 and 21:00 - check CBC and BMP daily with AM labs - DVT ppx with SCDs, avoid blood thinners for now   HPI: Stephanie Holden is a 47 y.o. female with past medical history of Type I DM on insulin pump, breast cancer s/p bilateral mastectomy 2019 and chemotherapy on surveillance and tamoxifen as well as chemotherapy-induced cardiomyopathy that has recovered with normal EF on last echo in 11/2021 who was admitted by Dr. Valentina Shaggy for scheduled TAH/SBO. She underwent surgery on 4/24 and in the afternoon on that day had a witnessed fall and remained orthostatic and presyncopal. In the late afternoon Hb was checked and had  fallen from 12.6 to 10.9. CTA abdomen pelvis was obtained around midnight and showed large pelvic hematoma 14cm with no active extravasation. She was taken back to the OR by Dr. Hyacinth Meeker for hematoma evacuation with no active bleeding seen. Intraoperative Hb was 8.8 and patient was given one unit of blood. This morning her Hb is 10.8 and patient is stable. She feels well overall, with well controlled abdominal pain.  Review of Systems: As mentioned in the history of present illness. All other systems reviewed and are negative. Past Medical History:  Diagnosis Date   Cancer    Breast cancer - right   CHF (congestive heart failure) 2020   chemo induced cardiomyopathy, 12/21/21 echocardiogram EF 55-60%   Constipation    resolved 07/09/22 per pt   Depression    Follows w/ Dr. Pamelia Hoit, oncologist.   Genetic testing 05/27/2017   Breast/GYN panel (23 genes) @ [facility] - No pathogenic mutations detected   GERD (gastroesophageal reflux disease)    resolved per patient as of 07/09/2022   History of chemotherapy 2019   Follows w/ Dr. Pamelia Hoit @ [facility].   History of radiation therapy 12/30/17- 02/10/18   Right chest wall/ 50 Gy in 25 fractions, Right SCV, PAB nodes/ 50 Gy in 25 fractions, Right Chest wall boost 10 gy in 5 fractions.    Insulin dependent type 1 diabetes mellitus    Follows w/ Dr. Darral Dash, endocronologist.   Insulin pump in place    Pleural effusion 01/13/2018   Pneumonia 01/12/2018   PONV (postoperative nausea and vomiting)    one time  per pt   Stenosing tenosynovitis of thumb 06/2014   right   Thyroid goiter    Wears contact lenses    Wears glasses    Past Surgical History:  Procedure Laterality Date   AXILLARY LYMPH NODE DISSECTION Right 06/26/2017   Procedure: AXILLARY LYMPH NODE DISSECTION;  Surgeon: Almond Lint, MD;  Location: [facility];  Service: General;  Laterality: Right;   CARPAL TUNNEL RELEASE Bilateral 10/28/2013   Procedure: BILATERAL CARPAL TUNNEL  RELEASE;  Surgeon: Cindee Salt, MD;  Location: [facility];  Service: Orthopedics;  Laterality: Bilateral;   CESAREAN SECTION  2000; 08/20/2001   CYSTOSCOPY N/A 07/17/2022   Procedure: CYSTOSCOPY;  Surgeon: Jerene Bears, MD;  Location: [facility];  Service: Gynecology;  Laterality: N/A;   DILATION AND CURETTAGE OF UTERUS  1998   ENDOMETRIAL BIOPSY  02/28/2022   benign   ESSURE TUBAL LIGATION  2005   failed, tubal puncture   LAPAROSCOPIC TUBAL LIGATION  04/02/2007   removal Essure   LAPAROSCOPIC UNILATERAL SALPINGECTOMY  2009   MASS EXCISION Left 03/11/2018   Procedure: EXCISION LEFT CHEST WALL MASS;  Surgeon: Almond Lint, MD;  Location: [facility];  Service: General;  Laterality: Left;   MASTECTOMY W/ SENTINEL NODE BIOPSY Bilateral 06/05/2017   Procedure: BILATERAL MASTECTOMIES  WITH RIGHT SENTINEL LYMPH NODE BIOPSY;  Surgeon: Almond Lint, MD;  Location: [facility];  Service: General;  Laterality: Bilateral;   PORT-A-CATH REMOVAL N/A 03/11/2018   Procedure: REMOVAL PORT-A-CATH;  Surgeon: Almond Lint, MD;  Location: [facility];  Service: General;  Laterality: N/A;   PORTACATH PLACEMENT Left 06/26/2017   Procedure: INSERTION PORT-A-CATH;  Surgeon: Almond Lint, MD;  Location: [facility];  Service: General;  Laterality: Left;   TOTAL LAPAROSCOPIC HYSTERECTOMY WITH SALPINGECTOMY Bilateral 07/17/2022   Procedure: TOTAL LAPAROSCOPIC HYSTERECTOMY WITH SALPINGECTOMY POSSIBLE OOPHORECTOMY;  Surgeon: Jerene Bears, MD;  Location: [facility];  Service: Gynecology;  Laterality: Bilateral;   TRIGGER FINGER RELEASE Left 04/12/2014   Procedure: RELEASE A-1 PULLEY LEFT THUMB;  Surgeon: Cindee Salt, MD;  Location: [facility];  Service: Orthopedics;  Laterality: Left;   TRIGGER FINGER RELEASE Right 07/19/2014   Procedure: RELEASE TRIGGER FINGER/A-1 PULLEY RIGHT THUMB;  Surgeon: Cindee Salt, MD;  Location: [facility];  Service: Orthopedics;   Laterality: Right;   VULVA Ples Specter BIOPSY  07/17/2022   Procedure: VULVAR BIOPSY;  Surgeon: Jerene Bears, MD;  Location: [facility];  Service: Gynecology;;   Social History:  reports that she quit smoking about 21 years ago. Her smoking use included cigarettes. She has a 0.26 pack-year smoking history. She has never used smokeless tobacco. She reports current alcohol use. She reports current drug use. Drug: Marijuana.  Allergies  Allergen Reactions   Dilaudid [Hydromorphone Hcl] Itching and Other (See Comments)    UNCONTROLLABLE CRYING    Family History  Problem Relation Age of Onset   Hypertension Mother    Hypertension Father    Breast cancer Other 36       paternal grandfather's niece    Prior to Admission medications   Medication Sig Start Date End Date Taking? Authorizing Provider  acetaminophen (TYLENOL) 500 MG tablet Take 500 mg by mouth every 6 (six) hours as needed.   Yes [provider]  amphetamine-dextroamphetamine (ADDERALL XR) 20 MG 24 hr capsule Take 20 mg by mouth every morning.   Yes [provider]  carvedilol (COREG) 6.25 MG tablet TAKE 1 TABLET  BY MOUTH TWICE DAILY WITH A MEAL 01/03/22  Yes Bensimhon, Bevelyn Buckles, MD  furosemide (LASIX) 40 MG tablet TAKE 1 TABLET BY MOUTH EVERY DAY AS NEEDED FOR FLUID/EDEMA 09/27/20  Yes Bensimhon, Bevelyn Buckles, MD  HUMALOG 100 UNIT/ML injection Inject into the skin. Via Insulin pump / average 36 units per day per pt - at basal 0.45 units/hr   Yes [provider]  KLOR-CON M20 20 MEQ tablet TAKE 1/2 TABLET BY MOUTH EVERY DAY AS NEEDED WITH FUROSEMIDE 12/25/20  Yes Bensimhon, Bevelyn Buckles, MD  LORazepam (ATIVAN) 0.5 MG tablet TAKE 1 TABLET BY MOUTH AT BEDTIME AS NEEDED FOR ANXIETY 02/19/22  Yes Serena Croissant, MD  Multiple Vitamin (MULTIVITAMIN WITH MINERALS) TABS tablet Take 1 tablet by mouth daily.   Yes [provider]  pravastatin (PRAVACHOL) 20 MG tablet Take 20 mg by mouth daily.   Yes  [provider]  Probiotic Product (PROBIOTIC BLEND PO) Take by mouth.   Yes [provider]  sacubitril-valsartan (ENTRESTO) 97-103 MG Take 1 tablet by mouth 2 (two) times daily. 04/01/22  Yes Bensimhon, Bevelyn Buckles, MD  spironolactone (ALDACTONE) 25 MG tablet TAKE 1/2 TABLET BY MOUTH EVERY DAY Patient taking differently: daily. 07/18/20  Yes Bensimhon, Bevelyn Buckles, MD  venlafaxine XR (EFFEXOR-XR) 75 MG 24 hr capsule TAKE 1 CAPSULE BY MOUTH DAILY WITH BREAKFAST Patient taking differently: Take 75 mg by mouth daily with breakfast. Takes 37.5 mg daily 06/17/22  Yes Serena Croissant, MD  Continuous Blood Gluc Sensor (DEXCOM G6 SENSOR) MISC Apply topically every 3 (three) days. 01/23/22   [provider]  CONTOUR TEST test strip USE TO TEST BLOOD SUGARS 6 TIMES DAILY ON PUMP DX  E10.9 09/21/18   [provider]  Insulin Disposable Pump (OMNIPOD 5 G6 POD, GEN 5,) MISC SMARTSIG:SUB-Q Every 3 Days 11/19/21   [provider]  Probiotic Product (PROBIOTIC PO) Take by mouth daily. Patient not taking: Reported on 07/17/2022    [provider]    Physical Exam: Vitals:   07/18/22 0700 07/18/22 0758 07/18/22 0800 07/18/22 0900  BP: (!) 132/58  127/86 (!) 137/53  Pulse: 77  (!) 103 73  Resp: 13  (!) 26 11  Temp:  98 F (36.7 C)    TempSrc:  Oral    SpO2: 100%  97% 100%  Weight:      Height:      General:  Alert, oriented, calm, in no acute distress, pleasant, daughter at bedside  Eyes: EOMI, clear conjuctivae, white sclerea Neck: supple, no masses, trachea mildline  Cardiovascular: RRR, no murmurs or rubs, no peripheral edema  Respiratory: clear to auscultation bilaterally, no wheezes, no crackles  Abdomen: soft, appropriately tender with well-approximated surgical incisions, nondistended, no bowel tones heard  Skin: dry, no rashes  Musculoskeletal: no joint effusions, normal range of motion  Psychiatric: appropriate affect, normal speech  Neurologic:  extraocular muscles intact, clear speech, moving all extremities with intact sensorium   Data Reviewed:      Latest Ref Rng & Units 07/18/2022    5:22 AM 07/18/2022    1:51 AM 07/17/2022    9:10 PM  CBC  Hemoglobin 12.0 - 15.0 g/dL 16.1  8.8  09.6   Hematocrit 36.0 - 46.0 % 33.1  26.0  33.2       Latest Ref Rng & Units 07/18/2022    5:22 AM 07/18/2022    1:51 AM 07/17/2022    7:07 PM  BMP  Glucose 70 - 99 mg/dL  182  207  213   BUN 6 - 20 mg/dL Creatinine 0.44 - 1.00 mg/dL 1.61  0.96  0.45   Sodium 135 - 145 mmol/L 137  136  136   Potassium 3.5 - 5.1 mmol/L 4.3  5.4  4.8   Chloride 98 - 111 mmol/L 101  98  101   CO2 22 - 32 mmol/L 24   26   Calcium 8.9 - 10.3 mg/dL 8.2   8.5    CT AP 4/09 VASCULAR   Aorta and branch vessels unremarkable. No evidence of active contrast extravasation.   NON-VASCULAR   Changes of hysterectomy. Large pelvic hematoma measuring up to 14 cm in craniocaudal length. This is also accompanied by moderate free fluid throughout the abdomen and pelvis and moderate to large amount of free air.  Primary team communication: Discussed on the phone is detail this AM with Dr. Hyacinth Meeker.   Thank you very much for involving [place] in the care of your patient.  Author: Orella Cushman Sharlette Dense, MD 07/18/2022 9:38 AM  For on call review [email or web address].

## 2022-07-18 NOTE — Transfer of Care (Signed)
Immediate Anesthesia Transfer of Care Note  Patient: Stephanie Holden  Procedure(s) Performed: OPERATIVE LAPAROSCOPIC EVACUATION OF POST OPERATIVE HEMATOMA (Abdomen)  Patient Location: PACU  Anesthesia Type:General  Level of Consciousness: sedated  Airway & Oxygen Therapy: Patient Spontanous Breathing and Patient connected to face mask oxygen  Post-op Assessment: Report given to RN and Post -op Vital signs reviewed and stable  Post vital signs: Reviewed and stable  Last Vitals:  Vitals Value Taken Time  BP    Temp    Pulse    Resp    SpO2      Last Pain:  Vitals:   07/18/22 0000  TempSrc:   PainSc: 6       Patients Stated Pain Goal: 5 (07/17/22 1930)  Complications: No notable events documented.

## 2022-07-19 ENCOUNTER — Encounter (HOSPITAL_COMMUNITY): Payer: Self-pay | Admitting: Obstetrics & Gynecology

## 2022-07-19 DIAGNOSIS — N950 Postmenopausal bleeding: Secondary | ICD-10-CM | POA: Diagnosis not present

## 2022-07-19 DIAGNOSIS — I9581 Postprocedural hypotension: Secondary | ICD-10-CM

## 2022-07-19 DIAGNOSIS — I427 Cardiomyopathy due to drug and external agent: Secondary | ICD-10-CM

## 2022-07-19 DIAGNOSIS — L7632 Postprocedural hematoma of skin and subcutaneous tissue following other procedure: Secondary | ICD-10-CM | POA: Diagnosis not present

## 2022-07-19 LAB — BPAM RBC
ISSUE DATE / TIME: 202404261042
Unit Type and Rh: 5100
Unit Type and Rh: 5100

## 2022-07-19 LAB — CBC
HCT: 26.7 % — ABNORMAL LOW (ref 36.0–46.0)
Hemoglobin: 8.8 g/dL — ABNORMAL LOW (ref 12.0–15.0)
MCH: 29.8 pg (ref 26.0–34.0)
MCHC: 33 g/dL (ref 30.0–36.0)
MCV: 90.5 fL (ref 80.0–100.0)
Platelets: 153 10*3/uL (ref 150–400)
RBC: 2.95 MIL/uL — ABNORMAL LOW (ref 3.87–5.11)
RDW: 14.4 % (ref 11.5–15.5)
WBC: 6.6 10*3/uL (ref 4.0–10.5)
nRBC: 0 % (ref 0.0–0.2)

## 2022-07-19 LAB — BASIC METABOLIC PANEL
Anion gap: 6 (ref 5–15)
BUN: 12 mg/dL (ref 6–20)
CO2: 28 mmol/L (ref 22–32)
Calcium: 7.9 mg/dL — ABNORMAL LOW (ref 8.9–10.3)
Chloride: 106 mmol/L (ref 98–111)
Creatinine, Ser: 0.87 mg/dL (ref 0.44–1.00)
GFR, Estimated: >60 mL/min (ref >60)
Glucose, Bld: 97 mg/dL (ref 70–99)
Potassium: 4.2 mmol/L (ref 3.5–5.1)
Sodium: 140 mmol/L (ref 135–145)

## 2022-07-19 LAB — GLUCOSE, CAPILLARY: Glucose-Capillary: 94 mg/dL (ref 70–99)

## 2022-07-19 LAB — TYPE AND SCREEN: Unit division: 0

## 2022-07-19 LAB — HEMOGLOBIN AND HEMATOCRIT, BLOOD
HCT: 28.1 % — ABNORMAL LOW (ref 36.0–46.0)
Hemoglobin: 9.3 g/dL — ABNORMAL LOW (ref 12.0–15.0)

## 2022-07-19 MED ORDER — OXYCODONE-ACETAMINOPHEN 5-325 MG PO TABS: 1.0000 | ORAL_TABLET | Freq: Four times a day (QID) | ORAL | 0 refills | Status: AC | PRN

## 2022-07-19 MED ORDER — LACTATED RINGERS IV BOLUS
500.0000 mL | Freq: Once | INTRAVENOUS | Status: AC
  Administered 2022-07-19: 500 mL via INTRAVENOUS

## 2022-07-19 MED ORDER — SACUBITRIL-VALSARTAN 97-103 MG PO TABS
1.0000 | ORAL_TABLET | Freq: Two times a day (BID) | ORAL | Status: DC
  Filled 2022-07-19: qty 1

## 2022-07-19 MED ORDER — VENLAFAXINE HCL ER 37.5 MG PO CP24
37.5000 mg | ORAL_CAPSULE | Freq: Every day | ORAL | Status: DC
  Administered 2022-07-19: 37.5 mg via ORAL
  Filled 2022-07-19: qty 1

## 2022-07-19 NOTE — Consult Note (Signed)
Cardiology Consultation   Patient ID: Stephanie Holden MRN: [id number]; DOB: 03/19/1976  Admit date: 07/17/2022 Date of Consult: 07/19/2022  PCP:  Gaspar Garbe, MD   [facility] Providers Cardiologist:  Arvilla Meres, MD   {    Patient Profile:   Stephanie Holden is a 47 y.o. female with a history of chemo-induced cardiomyopathy/ chronic HFrEF with EF as low as 25-30% in 01/2018 but has subsequently normalized to 55-60% on last Echo in 11/2021,  type 1 diabetes on insulin pump, thyroid goiter, and breast cancer s/p bilateral mastectomy and chemotherapy who presented on 07/17/2022 for planned total laparoscopic hysterectomy and bilateral salpingo-oophorectomy. Complicated by a post-op hematoma with hypotension requiring IV fluids and PRBCs. She subsequently underwent laparoscopic evacuation of the hematoma on 07/18/2022. Cardiology consulted to assist with CHF medications at the request of Dr. Hyacinth Meeker.   History of Present Illness:   Stephanie Holden is a 47 year old female with the above history who is followed by Dr. Gala Romney for chemo-induced cardiomyopathy. Patient was diagnosed with breast cancer in 04/2017. Echo in 05/2017 showed LVEF of 55-60% with normal wall motion and diastolic parameters and a global longitudinal strain of -18.6. She was treated with chemotherapy (Adriamycin and Cytoxan followed by Taxol. She was subsequently admitted in 12/2017 for pneumonia and found to have new systolic CHF. Echo at that time showed LVEF of 25-30% with diffuse hypokinesis and grade 2 diastolic dysfunction. Course was complicated by a large right pleural effusion which ultimately required a chest tube. Effusion was found to be transudate with negative cytology. She was started on GDMT. Cardiac MRI in 02/2018 showed normal LV size with LVEF of 37% and diffuse hypokinesis, normal RV sized with RVEF of 43%, and no myocardial LGE to suggest prior MI, infiltrative disease, or myocarditis. Coronary CTA in  02/2019 showed a coronary calcium score of 0 with no evidence of CAD. EF subsequently normalized. Patient was last seen by Dr. Gala Romney in 11/2021 at which time she was feeling great. She had lost 45 lbs with [place] program and was remaining active with no chest pain, shortness of breath, or CHF symptoms. Echo at that visit showed LVEF of 55-60% with no regional wall motion abnormalities and normal diastolic function.  Patient presented to [facility] on 07/17/2022 for planned total laparoscopic hysterectomy and bilateral salpingo-oophorectomy. Surgery was uneventful but she was noted to have 3 orthostatic events later that day with one syncopal episode where she fell. A nurse did catch her but they fell into the wall and patient hit her head. Head CT was negative. CTA of abdomen/ pelvic was also ordered and showed a large pelvic hematoma measuring up to 14 cm in craniocaudal length accompanied by moderate free fluid throughout the abdomen and pelvis and moderate to large amount of free air. Hemoglobin dropped as low as 7.4. She was treated with IV fluids and was given 2 unit of PRBCs on 4/25. Home CHF medications were held. She underwent laparoscopic evacuation of the hematoma on 4/25. Hemoglobin in stable this morning; however, BP remains soft. Therefore, Cardiology consulted for assistance in regards to her home CHF medications.  At the time of this evaluation, patient is resting comfortably in no acute distress. She was doing well from a cardiac standpoint prior to admission. She has tolerated the IV fluids and blood transfusions well. She has some very minimal pedal edema but no other edema and lungs clear. She denies any shortness of breath, orthopnea, or  PND. No chest pain or palpitations. She had some dizziness that she described as the "room spinning" prior to the syncopal episode after surgery but no other lightheadedness or dizziness. No other episodes of near syncope or syncope. No recent  fevers or illness. NO URI or GI symptoms. No abnormal bleeding in urine or stools.  Past Medical History:  Diagnosis Date   Cancer [facility])    Breast cancer - right   CHF (congestive heart failure) (HCC) 2020   chemo induced cardiomyopathy, 12/21/21 echocardiogram EF 55-60%   Constipation    resolved 07/09/22 per pt   Depression    Follows w/ Dr. Pamelia Hoit, oncologist.   Genetic testing 05/27/2017   Breast/GYN panel (23 genes) @ [facility] - No pathogenic mutations detected   GERD (gastroesophageal reflux disease)    resolved per patient as of 07/09/2022   History of chemotherapy 2019   Follows w/ Dr. Pamelia Hoit @ [facility].   History of radiation therapy 12/30/17- 02/10/18   Right chest wall/ 50 Gy in 25 fractions, Right SCV, PAB nodes/ 50 Gy in 25 fractions, Right Chest wall boost 10 gy in 5 fractions.    Insulin dependent type 1 diabetes mellitus (HCC)    Follows w/ Dr. Darral Dash, endocronologist.   Insulin pump in place    Pleural effusion 01/13/2018   Pneumonia 01/12/2018   PONV (postoperative nausea and vomiting)    one time per pt   Stenosing tenosynovitis of thumb 06/2014   right   Thyroid goiter    Wears contact lenses    Wears glasses     Past Surgical History:  Procedure Laterality Date   AXILLARY LYMPH NODE DISSECTION Right 06/26/2017   Procedure: AXILLARY LYMPH NODE DISSECTION;  Surgeon: Almond Lint, MD;  Location: [facility] OR;  Service: General;  Laterality: Right;   CARPAL TUNNEL RELEASE Bilateral 10/28/2013   Procedure: BILATERAL CARPAL TUNNEL RELEASE;  Surgeon: Cindee Salt, MD;  Location: [facility];  Service: Orthopedics;  Laterality: Bilateral;   CESAREAN SECTION  2000; 08/20/2001   CYSTOSCOPY N/A 07/17/2022   Procedure: CYSTOSCOPY;  Surgeon: Jerene Bears, MD;  Location: [facility];  Service: Gynecology;  Laterality: N/A;   DILATION AND CURETTAGE OF UTERUS  1998   ENDOMETRIAL BIOPSY  02/28/2022   benign   ESSURE TUBAL LIGATION   2005   failed, tubal puncture   LAPAROSCOPIC TUBAL LIGATION  04/02/2007   removal Essure   LAPAROSCOPIC UNILATERAL SALPINGECTOMY  2009   MASS EXCISION Left 03/11/2018   Procedure: EXCISION LEFT CHEST WALL MASS;  Surgeon: Almond Lint, MD;  Location: [facility];  Service: General;  Laterality: Left;   MASTECTOMY W/ SENTINEL NODE BIOPSY Bilateral 06/05/2017   Procedure: BILATERAL MASTECTOMIES  WITH RIGHT SENTINEL LYMPH NODE BIOPSY;  Surgeon: Almond Lint, MD;  Location: [facility];  Service: General;  Laterality: Bilateral;   PORT-A-CATH REMOVAL N/A 03/11/2018   Procedure: REMOVAL PORT-A-CATH;  Surgeon: Almond Lint, MD;  Location: [facility];  Service: General;  Laterality: N/A;   PORTACATH PLACEMENT Left 06/26/2017   Procedure: INSERTION PORT-A-CATH;  Surgeon: Almond Lint, MD;  Location: [facility];  Service: General;  Laterality: Left;   TOTAL LAPAROSCOPIC HYSTERECTOMY WITH SALPINGECTOMY Bilateral 07/17/2022   Procedure: TOTAL LAPAROSCOPIC HYSTERECTOMY WITH SALPINGECTOMY POSSIBLE OOPHORECTOMY;  Surgeon: Jerene Bears, MD;  Location: [facility];  Service: Gynecology;  Laterality: Bilateral;   TRIGGER FINGER RELEASE Left 04/12/2014   Procedure: RELEASE A-1 PULLEY LEFT THUMB;  Surgeon: Cindee Salt, MD;  Location: [facility];  Service: Orthopedics;  Laterality: Left;   TRIGGER FINGER RELEASE Right 07/19/2014   Procedure: RELEASE TRIGGER FINGER/A-1 PULLEY RIGHT THUMB;  Surgeon: Cindee Salt, MD;  Location: [facility];  Service: Orthopedics;  Laterality: Right;   VULVA Ples Specter BIOPSY  07/17/2022   Procedure: VULVAR BIOPSY;  Surgeon: Jerene Bears, MD;  Location: [facility];  Service: Gynecology;;     Home Medications:  Prior to Admission medications   Medication Sig Start Date End Date Taking? Authorizing Provider  acetaminophen (TYLENOL) 500 MG tablet Take 1,000 mg by mouth every 6 (six) hours as needed for mild pain or headache.   Yes [provider]  amphetamine-dextroamphetamine (ADDERALL XR) 20 MG 24 hr capsule Take 20 mg by mouth every morning.   Yes [provider]  carvedilol (COREG) 6.25 MG tablet TAKE 1 TABLET BY MOUTH TWICE DAILY WITH A MEAL Patient taking differently: Take 6.25 mg by mouth in the morning and at bedtime. 01/03/22  Yes Bensimhon, Bevelyn Buckles, MD  Continuous Blood Gluc Sensor (DEXCOM G6 SENSOR) MISC Inject 1 Device into the skin See admin instructions. Place 1 new sensor into the skin every 10 days 01/23/22  Yes [provider]  furosemide (LASIX) 40 MG tablet TAKE 1 TABLET BY MOUTH EVERY DAY AS NEEDED FOR FLUID/EDEMA Patient taking differently: Take 40 mg by mouth daily as needed for fluid or edema. 09/27/20  Yes Bensimhon, Bevelyn Buckles, MD  Insulin Disposable Pump (OMNIPOD 5 G6 POD, GEN 5,) MISC Inject 1 Device into the skin every 3 (three) days. 11/19/21  Yes [provider]  insulin lispro (HUMALOG) 100 UNIT/ML injection Inject into the skin. Per Omnipod 5 G6- Continuous- Target BGL is 120   Yes [provider]  KLOR-CON M20 20 MEQ tablet TAKE 1/2 TABLET BY MOUTH EVERY DAY AS NEEDED WITH FUROSEMIDE Patient taking differently: Take 10 mEq by mouth See admin instructions. Take 10 mEq by mouth once a day when taking Furosemide 12/25/20  Yes Bensimhon, Bevelyn Buckles, MD  LORazepam (ATIVAN) 0.5 MG tablet TAKE 1 TABLET BY MOUTH AT BEDTIME AS NEEDED FOR ANXIETY Patient taking differently: Take 0.5 mg by mouth daily as needed for anxiety or sleep. TAKE 1 TABLET BY MOUTH AT BEDTIME AS NEEDED FOR ANXIETY 02/19/22  Yes Serena Croissant, MD  Multiple Vitamin (MULTIVITAMIN WITH MINERALS) TABS tablet Take 1 tablet by mouth daily with breakfast.   Yes [provider]  pravastatin (PRAVACHOL) 20 MG tablet Take 20 mg by mouth at bedtime.   Yes [provider]  Probiotic Product (PROBIOTIC PO) Take 1 capsule by mouth daily.   Yes [provider]  sacubitril-valsartan (ENTRESTO)  97-103 MG Take 1 tablet by mouth 2 (two) times daily. 04/01/22  Yes Bensimhon, Bevelyn Buckles, MD  spironolactone (ALDACTONE) 25 MG tablet TAKE 1/2 TABLET BY MOUTH EVERY DAY Patient taking differently: Take 12.5 mg by mouth daily. 07/18/20  Yes Bensimhon, Bevelyn Buckles, MD  venlafaxine XR (EFFEXOR-XR) 37.5 MG 24 hr capsule Take 37.5 mg by mouth daily with breakfast.   Yes [provider]  CONTOUR TEST test strip USE TO TEST BLOOD SUGARS 6 TIMES DAILY ON PUMP DX  E10.9 09/21/18   [provider]  venlafaxine XR (EFFEXOR-XR) 75 MG 24 hr capsule TAKE 1 CAPSULE BY MOUTH DAILY WITH BREAKFAST Patient not taking: Reported on 07/18/2022 06/17/22   Serena Croissant, MD    Inpatient Medications: Scheduled Meds:  sodium chloride   Intravenous  Once   acetaminophen  1,000 mg Oral Q6H   Chlorhexidine Gluconate Cloth  6 each Topical Q0600   insulin pump   Subcutaneous Q4H   pantoprazole (PROTONIX) IV  40 mg Intravenous Q24H   sacubitril-valsartan  1 tablet Oral BID   venlafaxine XR  37.5 mg Oral Q breakfast   Continuous Infusions:  lactated ringers Stopped (07/19/22 0839)   promethazine (PHENERGAN) injection (IM or IVPB) Stopped (07/17/22 2342)   PRN Meds: alum & mag hydroxide-simeth, menthol-cetylpyridinium, morphine injection, ondansetron **OR** ondansetron (ZOFRAN) IV, mouth rinse, oxyCODONE-acetaminophen, promethazine (PHENERGAN) injection (IM or IVPB), simethicone, traMADol  Allergies:    Allergies  Allergen Reactions   Dilaudid [Hydromorphone Hcl] Itching and Other (See Comments)    "UNCONTROLLABLE CRYING," also    Social History:   Social History   Socioeconomic History   Marital status: Married    Spouse name: Not on file   Number of children: Not on file   Years of education: Not on file   Highest education level: Not on file  Occupational History   Not on file  Tobacco Use   Smoking status: Former    Packs/day: 0.13    Years: 2.00    Additional pack years: 0.00    Total pack  years: 0.26    Types: Cigarettes    Quit date: 03/24/2001    Years since quitting: 21.3   Smokeless tobacco: Never  Vaping Use   Vaping Use: Never used  Substance and Sexual Activity   Alcohol use: Yes    Comment: about 1 per month   Drug use: Yes    Types: Marijuana    Comment: last used 6 years ago as of 07/09/22 per pt   Sexual activity: Yes    Partners: Male    Birth control/protection: Surgical    Comment: tubal ligation & 1 tube removed, menarche 47yo, sexual debut 47yo  Other Topics Concern   Not on file  Social History Narrative   Not on file   Social Determinants of Health   Financial Resource Strain: Not on file  Food Insecurity: Not on file  Transportation Needs: No Transportation Needs (12/22/2017)   PRAPARE - Administrator, Civil Service (Medical): No    Lack of Transportation (Non-Medical): No  Physical Activity: Not on file  Stress: Not on file  Social Connections: Not on file  Intimate Partner Violence: Not At Risk (12/22/2017)   Humiliation, Afraid, Rape, and Kick questionnaire    Fear of Current or Ex-Partner: No    Emotionally Abused: No    Physically Abused: No    Sexually Abused: No    Family History:   Family History  Problem Relation Age of Onset   Hypertension Mother    Hypertension Father    Breast cancer Other 70       paternal grandfather's niece     ROS:  Please see the history of present illness.  Review of Systems  Constitutional:  Negative for chills and fever.  HENT:  Negative for congestion.   Respiratory:  Negative for cough and shortness of breath.   Cardiovascular:  Positive for leg swelling (mild pedal edema). Negative for chest pain, palpitations, orthopnea and PND.  Gastrointestinal:  Negative for blood in stool, diarrhea, melena, nausea and vomiting.  Genitourinary:  Negative for hematuria.  Musculoskeletal:  Positive for falls. Negative for myalgias.  Neurological:  Positive for dizziness and loss of  consciousness.  Endo/Heme/Allergies:  Does not bruise/bleed easily.  Psychiatric/Behavioral:  Negative for substance abuse (former tobacco use).     Physical Exam/Data:   Vitals:   07/19/22 0500 07/19/22 0600 07/19/22 0700 07/19/22 0800  BP: (!) 88/44 (!) 87/44 (!) 96/49   Pulse: (!) 55 62 80   Resp: 13 12 15    Temp:    98.6 F (37 C)  TempSrc:    Oral  SpO2: 99% 100% 100%   Weight:      Height:        Intake/Output Summary (Last 24 hours) at 07/19/2022 0848 Last data filed at 07/19/2022 0700 Gross per 24 hour  Intake 1537.5 ml  Output 3750 ml  Net -2212.5 ml      07/18/2022    5:15 AM 07/17/2022    6:22 AM 07/15/2022    8:54 AM  Last 3 Weights  Weight (lbs) 128 lb 8.5 oz 144 lb 8 oz 144 lb  Weight (kg) 58.3 kg 65.545 kg 65.318 kg     Body mass index is 23.51 kg/m.  General: 47 y.o. Caucasian female resting comfortably in no acute distress. HEENT: Normocephalic and atraumatic. Sclera clear.  Neck: Supple. No JVD. Heart: RRR. Distinct S1 and S2. No murmurs, gallops, or rubs.  Lungs: No increased work of breathing. Clear to ausculation bilaterally. No wheezes, rhonchi, or rales.  Abdomen: Soft, non-distended, and non-tender to palpation.  Extremities: Trace pedal edema bilaterally.    Skin: Warm and dry. Neuro: Alert and oriented x3. No focal deficits. Psych: Normal affect. Responds appropriately.  EKG:  The EKG was personally reviewed and demonstrates:  Normal sinus rhythm, rate 87 bpm, with LVH and non-specific ST/T changes. Normal axis. Normal PR and QRS intervals. QTc 457 ms. Telemetry:  Telemetry was personally reviewed and demonstrates:  Sinus rhythm with rates in the 50s to 70s at rest. Increases to the 90s at time (suspect this is with exertion).  Relevant CV Studies: Echocardiogram 12/21/2021:  1. Left ventricular ejection fraction, by estimation, is 55 to 60%. The  left ventricle has normal function. The left ventricle has no regional  wall motion  abnormalities. Left ventricular diastolic parameters were  normal.   2. Right ventricular systolic function is normal. The right ventricular  size is normal.   3. The mitral valve is normal in structure. No evidence of mitral valve  regurgitation. No evidence of mitral stenosis.   4. The aortic valve is tricuspid. Aortic valve regurgitation is not  visualized. No aortic stenosis is present.   5. The inferior vena cava is normal in size with greater than 50%  respiratory variability, suggesting right atrial pressure of 3 mmHg.    Laboratory Data:  High Sensitivity Troponin:  No results for input(s): "TROPONINIHS" in the last 720 hours.   Chemistry Recent Labs  Lab 07/17/22 1907 07/18/22 0151 07/18/22 0522 07/19/22 0329  NA 136 136 137 140  K 4.8 5.4* 4.3 4.2  CL 101 98 101 106  CO2 26  --  24 28  GLUCOSE 213* 207* 182* 97  BUN 25* 26* 24* 12  CREATININE 0.91 1.00 1.04* 0.87  CALCIUM 8.5*  --  8.2* 7.9*  GFRNONAA >60  --  >60 >60  ANIONGAP 9  --  12 6    No results for input(s): "PROT", "ALBUMIN", "AST", "ALT", "ALKPHOS", "BILITOT" in the last 168 hours. Lipids No results for input(s): "CHOL", "TRIG", "HDL", "LABVLDL", "LDLCALC", "CHOLHDL" in the last 168 hours.  Hematology Recent Labs  Lab 07/15/22 1610 07/17/22 1445 07/17/22 2110 07/18/22 1811 07/18/22 2045  07/19/22 0329  WBC 6.8 11.5*  --   --   --  6.6  RBC 4.58 4.16  --   --   --  2.95*  HGB 14.0 12.6   < > 7.4* 7.5* 8.8*  HCT 41.9 38.0   < > 22.1* 22.9* 26.7*  MCV 91.5 91.3  --   --   --  90.5  MCH 30.6 30.3  --   --   --  29.8  MCHC 33.4 33.2  --   --   --  33.0  RDW 12.9 12.9  --   --   --  14.4  PLT 241 260  --   --   --  153   < > = values in this interval not displayed.   Thyroid No results for input(s): "TSH", "FREET4" in the last 168 hours.  BNP Recent Labs  Lab 07/17/22 1907  BNP 113.5*    DDimer No results for input(s): "DDIMER" in the last 168 hours.   Radiology/Studies:  CT Angio  Abd/Pel w/ and/or w/o  Result Date: 07/18/2022 CLINICAL DATA:  Pelvic hematoma following hysterectomy status post surgical re-exploration and evacuation. Acute anemia. EXAM: CTA ABDOMEN AND PELVIS WITHOUT AND WITH CONTRAST TECHNIQUE: Multidetector CT imaging of the abdomen and pelvis was performed using the standard protocol during bolus administration of intravenous contrast. Multiplanar reconstructed images and MIPs were obtained and reviewed to evaluate the vascular anatomy. RADIATION DOSE REDUCTION: This exam was performed according to the departmental dose-optimization program which includes automated exposure control, adjustment of the mA and/or kV according to patient size and/or use of iterative reconstruction technique. CONTRAST:  OMNIPAQUE IOHEXOL 350 MG/ML SOLN COMPARISON:  07/17/2022 FINDINGS: VASCULAR Aorta: Normal caliber aorta without aneurysm, dissection, vasculitis or significant stenosis. Celiac: Moderate mass effect secondary to the median arcuate ligament upon the origin without hemodynamically significant stenosis. Otherwise widely patent. No aneurysm or dissection. SMA: Patent without evidence of aneurysm, dissection, vasculitis or significant stenosis. Renals: Both renal arteries are patent without evidence of aneurysm, dissection, vasculitis, fibromuscular dysplasia or significant stenosis. IMA: Patent without evidence of aneurysm, dissection, vasculitis or significant stenosis. Inflow: Patent without evidence of aneurysm, dissection, vasculitis or significant stenosis. Proximal Outflow: Bilateral common femoral and visualized portions of the superficial and profunda femoral arteries are patent without evidence of aneurysm, dissection, vasculitis or significant stenosis. Veins: Unremarkable Review of the MIP images confirms the above findings. NON-VASCULAR Lower chest: No acute abnormality. Hepatobiliary: Vicarious excretion of contrast within the gallbladder lumen. Gallbladder  otherwise unremarkable. Liver unremarkable. Pancreas: Unremarkable Spleen: Unremarkable Adrenals/Urinary Tract: The adrenal glands are unremarkable. Kidneys are normal. Foley catheter balloon is seen within a decompressed bladder lumen. Stomach/Bowel: Stomach, small bowel, and large bowel are unremarkable. Small hemoperitoneum is present, improved since prior examination. Minimal free intraperitoneal gas noted, also improved since prior exam. Lymphatic: No pathologic adenopathy within the abdomen and pelvis. Reproductive: Status post hysterectomy. Previously noted large pelvic hematoma within the pelvis has been evacuated in the interval. Small residual 2.9 x 4.3 x 5.8 cm hematoma is seen along the left pelvic sidewall tracking along the inferior mesenteric vein. Extensive interstitial gas is seen within the extraperitoneal perirectal and perivesicular soft tissues in keeping with recent surgical intervention. Similarly, gas is seen within the a subcutaneous and intramuscular right abdominal wall in keeping with recent laparoscopic intervention. No active extravasation identified. Other: None Musculoskeletal: No acute bone abnormality. No lytic or blastic bone lesion. IMPRESSION: 1. No active extravasation identified. 2. Status post  hysterectomy with evacuation of previously noted large pelvic hematoma. Small residual hematoma along the left pelvic sidewall tracking along the inferior mesenteric vein. 3. Small hemoperitoneum, improved since prior examination. 4. Extensive interstitial gas within the extraperitoneal perirectal and perivesicular soft tissues in keeping with recent surgical intervention. Similarly, gas is seen within the subcutaneous and intramuscular right abdominal wall in keeping with recent laparoscopic intervention. Improved free intraperitoneal gas. Electronically Signed   By: Helyn Numbers M.D.   On: 07/18/2022 20:46   CT Angio Abd/Pel w/ and/or w/o  Result Date: 07/17/2022 CLINICAL DATA:   Abdominal aortic aneurysm (AAA), post-op post op hysterectomy hypovolemia, syncope EXAM: CTA ABDOMEN AND PELVIS WITHOUT AND WITH CONTRAST TECHNIQUE: Multidetector CT imaging of the abdomen and pelvis was performed using the standard protocol during bolus administration of intravenous contrast. Multiplanar reconstructed images and MIPs were obtained and reviewed to evaluate the vascular anatomy. RADIATION DOSE REDUCTION: This exam was performed according to the departmental dose-optimization program which includes automated exposure control, adjustment of the mA and/or kV according to patient size and/or use of iterative reconstruction technique. CONTRAST:  OMNIPAQUE IOHEXOL 350 MG/ML SOLN COMPARISON:  None Available. FINDINGS: VASCULAR Aorta: Normal caliber aorta without aneurysm, dissection, vasculitis or significant stenosis. Celiac: Patent without evidence of aneurysm, dissection, vasculitis or significant stenosis. SMA: Patent without evidence of aneurysm, dissection, vasculitis or significant stenosis. Renals: Both renal arteries are patent without evidence of aneurysm, dissection, vasculitis, fibromuscular dysplasia or significant stenosis. IMA: Patent without evidence of aneurysm, dissection, vasculitis or significant stenosis. Inflow: Patent without evidence of aneurysm, dissection, vasculitis or significant stenosis. Proximal Outflow: Bilateral common femoral and visualized portions of the superficial and profunda femoral arteries are patent without evidence of aneurysm, dissection, vasculitis or significant stenosis. Veins: No obvious venous abnormality within the limitations of this arterial phase study. Review of the MIP images confirms the above findings. NON-VASCULAR Lower chest: No acute findings Hepatobiliary: No focal hepatic abnormality. Gallbladder unremarkable. Pancreas: No focal abnormality or ductal dilatation. Spleen: No focal abnormality.  Normal size. Adrenals/Urinary Tract: No adrenal  abnormality. No focal renal abnormality. No stones or hydronephrosis. Urinary bladder is unremarkable. Stomach/Bowel: Stomach, large and small bowel grossly unremarkable. Lymphatic: No adenopathy Reproductive: Changes of hysterectomy. Large hematoma the pelvis measuring 14.1 x 11.4 x 8.9 cm. No active extravasation of contrast seen. Other: In addition to the large pelvic hematoma, there is moderate free fluid in the abdomen and pelvis and moderate to large free air. Musculoskeletal: No acute bony abnormality. IMPRESSION: VASCULAR Aorta and branch vessels unremarkable. No evidence of active contrast extravasation. NON-VASCULAR Changes of hysterectomy. Large pelvic hematoma measuring up to 14 cm in craniocaudal length. This is also accompanied by moderate free fluid throughout the abdomen and pelvis and moderate to large amount of free air. These results were called by telephone at the time of interpretation on 07/17/2022 at 11:42 pm to provider [facility] , who verbally acknowledged these results. Electronically Signed   By: Charlett Nose M.D.   On: 07/17/2022 23:42   CT HEAD WO CONTRAST ( )  Result Date: 07/17/2022 CLINICAL DATA:  Head trauma, coagulopathy (Age 46-64y) fall after ambulation post op, hit head on wall, given pre op lovenox EXAM: CT HEAD WITHOUT CONTRAST TECHNIQUE: Contiguous axial images were obtained from the base of the skull through the vertex without intravenous contrast. RADIATION DOSE REDUCTION: This exam was performed according to the departmental dose-optimization program which includes automated exposure control, adjustment of the mA and/or kV according to patient size  and/or use of iterative reconstruction technique. COMPARISON:  None Available. FINDINGS: Brain: No evidence of acute infarction, hemorrhage, hydrocephalus, extra-axial collection or mass lesion/mass effect. Vascular: No hyperdense vessel. Skull: No acute fracture. Sinuses/Orbits: Clear sinuses.  No acute orbital findings.  Other: No mastoid effusions. IMPRESSION: No evidence of acute intracranial abnormality. Electronically Signed   By: Feliberto Harts M.D.   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 Subsequent cardiac MRI showed no evidence of LGE suggestive of prior MI, infiltrative disease, or myocarditis and coronary CTA showed no evidence of CAD. EF has since normalized. Last Echo in 11/2021 showed LVEF of 55-60% with no regional wall motion abnormalities and normal diastolic function. She was admitted on 07/17/2022 for planned total laparoscopic hysterectomy and bilateral salpingo-oophorectomy. Complicated by a post-op hematoma with hypotension requiring IV fluids and PRBCs. She subsequently underwent a laparoscopic evacuation of the hematoma on 07/18/2022. Cardiology was asked to help assist with resuming CHF medications. She is euvolemic on exam with only trace pedal edema and denies any shortness of breath, orthopnea, or PND. She is on PRN Lasix 40mg , Enresto 97-103mg  twice daily, Coreg 6.25.mg twice daily, and Spironolactone 12.5mg  once daily at home. BP remains soft this morning with BP as low as 84/50. She received another 500cc bolus of fluid and systolic BP improved to the 100s. Hemoglobin stable this morning. She does not need any Lasix at this time but would monitor fluid status carefully with all the IV fluids and blood transfusion. Would continue hold home medications today and then gradually resume them (likely one at a time) as BP improves. May need to start with a lower dose of Entresto. Will review with MD to see if she has any specific recommendations on how to resume.  Syncope This occurred in the setting of post-op hematoma with associated acute blood loss anemia and hypotension requiring IV fluids and PRBCs. Patient denies any  episodes of near syncope or syncope prior to this and none since above treatment. No concerning arrhythmias noted on monitor. No additional cardiac work-up necessary.  Otherwise, per primary team and/or Internal Medicine: - Acute post-op blood loss anemia secondary to pelvic hematoma following TLH/ BSO - Type 1 diabetes mellitus   Risk Assessment/Risk Scores:    New York Heart Association (NYHA) Functional Class NYHA Class I   For questions or updates, please contact [facility] Please consult [email or web address] for contact info under    Signed, Corrin Parker, PA-C  07/19/2022 8:48 AM

## 2022-07-19 NOTE — Plan of Care (Signed)

## 2022-07-19 NOTE — Progress Notes (Signed)
PROGRESS NOTE    Stephanie Holden  ZOX:[id number] DOB: 27-Sep-1975 DOA: 07/17/2022 PCP: Gaspar Garbe, MD    Brief Narrative:   Stephanie Holden is a 47 y.o. female with past medical history significant for chemotherapy-induced cardiomyopathy with recovered LVEF, type 1 diabetes mellitus on insulin pump, breast cancer s/p bilateral mastectomy 2019 s/p chemotherapy currently on tamoxifen to [facility] by GYN for scheduled TAH/BSO that was performed on 07/17/2022.  Postoperative course complicated by pelvic hematoma s/p hematoma evacuation and blood transfusion.  [facility] consult requested on 4/25 with assistance in medical management due to medical complexity.  Assessment & Plan:   Acute postoperative blood loss anemia 2/2 pelvic hematoma s/p laparoscopic evacuation TLH/BSO/cystoscopy Patient underwent scheduled TAH/BSO on 07/17/2022 complicated by postoperative pelvic hematoma.  CT angiogram abdomen/pelvis on 4/24 with large pelvic hematoma measuring up to 14 cm.  Patient underwent laparoscopic evacuation of hematoma on 07/18/2022 by Dr. Hyacinth Meeker.  Patient has been transfused 2 unit PRBCs/far during the hospitalization.  CT angiogram abdomen/pelvis on 4/25 shows no active extravasation identified with previous hematoma noted to be evacuated with small residual hematoma along the left pelvic sidewall.  GYN discussed with IR, no need for current intervention at this time. -- Hgb 12.6>>8.8>10.8>>7.4>7.5>8.8 -- Continue monitor hemoglobin every 6 hours  Hypotension Patient with low blood pressures overnight, 90/45 this morning.  Coreg was held yesterday, did receive Entresto overnight.  Etiology likely secondary to volume depletion in the setting of postoperative blood loss anemia as above. -- Continue IVF hydration -- Continue to hold home carvedilol, Entresto -- Continue monitor BP closely  Type 1 diabetes mellitus -- Continue insulin pump  Chemotherapy-induced cardiomyopathy with recovered  LVEF TTE 12/21/2021 with LVEF 55-6%, normal diastolic parameters.  Follows with cardiology/heart failure outpatient, Dr. Gala Romney. -- Continue to hold home carvedilol and Entresto until blood pressures improve   DVT prophylaxis: SCDs Start: 07/17/22 1126    Code Status: Full Code Family Communication: No family present at bedside this morning  Disposition Plan: Per primary, GYN Level of care: Stepdown Status is: Inpatient    Procedures:  Laparoscopic hysterectomy with BSO/cystoscopy, Dr. Hyacinth Meeker 4/24 Laparoscopic evacuation of postoperative hematoma, Dr. Hyacinth Meeker 4/25  Antimicrobials:  Perioperative cefotetan 4/24   Subjective: Patient seen and examined at bedside, resting comfortably.  Lying in bed.  Continues with fatigue.  Transfused 2 unit PRBCs yesterday.  Hemoglobin trending up.  Remains hypotensive, received IV fluid bolus overnight.  Did receive Entresto dose overnight.  Discussed will continue to hold carvedilol, will discontinue Entresto today.  Continue IV fluids.  Will continue close monitoring of hemoglobin.  Complains of mild abdominal tenderness.  No other complaints or concerns at this time.  Denies headache, no current dizziness, no fever/chills/night sweats, no nausea/vomiting/diarrhea, no chest pain, no shortness of breath, no focal weakness, no paresthesias.  Further per primary, GYN.  Discussed with overnight RN at bedside.  Objective: Vitals:   07/19/22 0500 07/19/22 0600 07/19/22 0700 07/19/22 0800  BP: (!) 88/44 (!) 87/44 (!) 96/49   Pulse: (!) 55 62 80   Resp: 13 12 15    Temp:    98.6 F (37 C)  TempSrc:    Oral  SpO2: 99% 100% 100%   Weight:      Height:        Intake/Output Summary (Last 24 hours) at 07/19/2022 0857 Last data filed at 07/19/2022 0700 Gross per 24 hour  Intake 1537.5 ml  Output 3750 ml  Net -2212.5 ml  Filed Weights   07/09/22 1107 07/17/22 0622 07/18/22 0515  Weight: 64.9 kg 65.5 kg 58.3 kg    Examination:  Physical  Exam: GEN: NAD, alert and oriented x 3, wd/wn HEENT: NCAT, PERRL, EOMI, sclera clear, MMM PULM: CTAB w/o wheezes/crackles, normal respiratory effort, on room air CV: RRR w/o M/G/R GI: abd soft, NTND, NABS, no R/G/M MSK: no peripheral edema, moves all extremities independently NEURO: CN II-XII intact, no focal deficits, sensation to light touch intact PSYCH: normal mood/affect     Data Reviewed: I have personally reviewed following labs and imaging studies  CBC: Recent Labs  Lab 07/15/22 0850 07/17/22 1445 07/17/22 2110 07/18/22 0522 07/18/22 1234 07/18/22 1811 07/18/22 2045 07/19/22 0329  WBC 6.8 11.5*  --   --   --   --   --  6.6  HGB 14.0 12.6   < > 10.8* 9.6* 7.4* 7.5* 8.8*  HCT 41.9 38.0   < > 33.1* 28.6* 22.1* 22.9* 26.7*  MCV 91.5 91.3  --   --   --   --   --  90.5  PLT 241 260  --   --   --   --   --  153   < > = values in this interval not displayed.   Basic Metabolic Panel: Recent Labs  Lab 07/15/22 0850 07/17/22 1907 07/18/22 0151 07/18/22 0522 07/19/22 0329  NA 138 136 136 137 140  K 5.3* 4.8 5.4* 4.3 4.2  CL 101 101 98 101 106  CO2 28 26  --  24 28  GLUCOSE 233* 213* 207* 182* 97  BUN 19 25* 26* 24* 12  CREATININE 0.86 0.91 1.00 1.04* 0.87  CALCIUM 9.3 8.5*  --  8.2* 7.9*   GFR: Estimated Creatinine Clearance: 64.6 mL/min (by C-G formula based on SCr of 0.87 mg/dL). Liver Function Tests: No results for input(s): "AST", "ALT", "ALKPHOS", "BILITOT", "PROT", "ALBUMIN" in the last 168 hours. No results for input(s): "LIPASE", "AMYLASE" in the last 168 hours. No results for input(s): "AMMONIA" in the last 168 hours. Coagulation Profile: Recent Labs  Lab 07/18/22 0945 07/18/22 2045  INR 1.1 1.2   Cardiac Enzymes: No results for input(s): "CKTOTAL", "CKMB", "CKMBINDEX", "TROPONINI" in the last 168 hours. BNP (last 3 results) No results for input(s): "PROBNP" in the last 8760 hours. HbA1C: No results for input(s): "HGBA1C" in the last 72  hours. CBG: Recent Labs  Lab 07/18/22 0034 07/18/22 0425 07/18/22 1228 07/18/22 1627 07/19/22 0820  GLUCAP 213* 189* 112* 157* 94   Lipid Profile: No results for input(s): "CHOL", "HDL", "LDLCALC", "TRIG", "CHOLHDL", "LDLDIRECT" in the last 72 hours. Thyroid Function Tests: No results for input(s): "TSH", "T4TOTAL", "FREET4", "T3FREE", "THYROIDAB" in the last 72 hours. Anemia Panel: No results for input(s): "VITAMINB12", "FOLATE", "FERRITIN", "TIBC", "IRON", "RETICCTPCT" in the last 72 hours. Sepsis Labs: No results for input(s): "PROCALCITON", "LATICACIDVEN" in the last 168 hours.  Recent Results (from the past 240 hour(s))  MRSA Next Gen by PCR, Nasal     Status: None   Collection Time: 07/18/22  5:21 AM   Specimen: Nasal Mucosa; Nasal Swab  Result Value Ref Range Status   MRSA by PCR Next Gen NOT DETECTED NOT DETECTED Final    Comment: (NOTE) The GeneXpert MRSA Assay (FDA approved for NASAL specimens only), is one component of a comprehensive MRSA colonization surveillance program. It is not intended to diagnose MRSA infection nor to guide or monitor treatment for MRSA infections. Test performance is not FDA  approved in patients less than 3 years old. Performed at [facility], [address], [place], [place] [address]          Radiology Studies: CT Angio Abd/Pel w/ and/or w/o  Result Date: 07/18/2022 CLINICAL DATA:  Pelvic hematoma following hysterectomy status post surgical re-exploration and evacuation. Acute anemia. EXAM: CTA ABDOMEN AND PELVIS WITHOUT AND WITH CONTRAST TECHNIQUE: Multidetector CT imaging of the abdomen and pelvis was performed using the standard protocol during bolus administration of intravenous contrast. Multiplanar reconstructed images and MIPs were obtained and reviewed to evaluate the vascular anatomy. RADIATION DOSE REDUCTION: This exam was performed according to the departmental dose-optimization program which includes  automated exposure control, adjustment of the mA and/or kV according to patient size and/or use of iterative reconstruction technique. CONTRAST:  OMNIPAQUE IOHEXOL 350 MG/ML SOLN COMPARISON:  07/17/2022 FINDINGS: VASCULAR Aorta: Normal caliber aorta without aneurysm, dissection, vasculitis or significant stenosis. Celiac: Moderate mass effect secondary to the median arcuate ligament upon the origin without hemodynamically significant stenosis. Otherwise widely patent. No aneurysm or dissection. SMA: Patent without evidence of aneurysm, dissection, vasculitis or significant stenosis. Renals: Both renal arteries are patent without evidence of aneurysm, dissection, vasculitis, fibromuscular dysplasia or significant stenosis. IMA: Patent without evidence of aneurysm, dissection, vasculitis or significant stenosis. Inflow: Patent without evidence of aneurysm, dissection, vasculitis or significant stenosis. Proximal Outflow: Bilateral common femoral and visualized portions of the superficial and profunda femoral arteries are patent without evidence of aneurysm, dissection, vasculitis or significant stenosis. Veins: Unremarkable Review of the MIP images confirms the above findings. NON-VASCULAR Lower chest: No acute abnormality. Hepatobiliary: Vicarious excretion of contrast within the gallbladder lumen. Gallbladder otherwise unremarkable. Liver unremarkable. Pancreas: Unremarkable Spleen: Unremarkable Adrenals/Urinary Tract: The adrenal glands are unremarkable. Kidneys are normal. Foley catheter balloon is seen within a decompressed bladder lumen. Stomach/Bowel: Stomach, small bowel, and large bowel are unremarkable. Small hemoperitoneum is present, improved since prior examination. Minimal free intraperitoneal gas noted, also improved since prior exam. Lymphatic: No pathologic adenopathy within the abdomen and pelvis. Reproductive: Status post hysterectomy. Previously noted large pelvic hematoma within the pelvis  has been evacuated in the interval. Small residual 2.9 x 4.3 x 5.8 cm hematoma is seen along the left pelvic sidewall tracking along the inferior mesenteric vein. Extensive interstitial gas is seen within the extraperitoneal perirectal and perivesicular soft tissues in keeping with recent surgical intervention. Similarly, gas is seen within the a subcutaneous and intramuscular right abdominal wall in keeping with recent laparoscopic intervention. No active extravasation identified. Other: None Musculoskeletal: No acute bone abnormality. No lytic or blastic bone lesion. IMPRESSION: 1. No active extravasation identified. 2. Status post hysterectomy with evacuation of previously noted large pelvic hematoma. Small residual hematoma along the left pelvic sidewall tracking along the inferior mesenteric vein. 3. Small hemoperitoneum, improved since prior examination. 4. Extensive interstitial gas within the extraperitoneal perirectal and perivesicular soft tissues in keeping with recent surgical intervention. Similarly, gas is seen within the subcutaneous and intramuscular right abdominal wall in keeping with recent laparoscopic intervention. Improved free intraperitoneal gas. Electronically Signed   By: Helyn Numbers M.D.   On: 07/18/2022 20:46   CT Angio Abd/Pel w/ and/or w/o  Result Date: 07/17/2022 CLINICAL DATA:  Abdominal aortic aneurysm (AAA), post-op post op hysterectomy hypovolemia, syncope EXAM: CTA ABDOMEN AND PELVIS WITHOUT AND WITH CONTRAST TECHNIQUE: Multidetector CT imaging of the abdomen and pelvis was performed using the standard protocol during bolus administration of intravenous contrast. Multiplanar reconstructed images and MIPs  were obtained and reviewed to evaluate the vascular anatomy. RADIATION DOSE REDUCTION: This exam was performed according to the departmental dose-optimization program which includes automated exposure control, adjustment of the mA and/or kV according to patient size and/or  use of iterative reconstruction technique. CONTRAST:  OMNIPAQUE IOHEXOL 350 MG/ML SOLN COMPARISON:  None Available. FINDINGS: VASCULAR Aorta: Normal caliber aorta without aneurysm, dissection, vasculitis or significant stenosis. Celiac: Patent without evidence of aneurysm, dissection, vasculitis or significant stenosis. SMA: Patent without evidence of aneurysm, dissection, vasculitis or significant stenosis. Renals: Both renal arteries are patent without evidence of aneurysm, dissection, vasculitis, fibromuscular dysplasia or significant stenosis. IMA: Patent without evidence of aneurysm, dissection, vasculitis or significant stenosis. Inflow: Patent without evidence of aneurysm, dissection, vasculitis or significant stenosis. Proximal Outflow: Bilateral common femoral and visualized portions of the superficial and profunda femoral arteries are patent without evidence of aneurysm, dissection, vasculitis or significant stenosis. Veins: No obvious venous abnormality within the limitations of this arterial phase study. Review of the MIP images confirms the above findings. NON-VASCULAR Lower chest: No acute findings Hepatobiliary: No focal hepatic abnormality. Gallbladder unremarkable. Pancreas: No focal abnormality or ductal dilatation. Spleen: No focal abnormality.  Normal size. Adrenals/Urinary Tract: No adrenal abnormality. No focal renal abnormality. No stones or hydronephrosis. Urinary bladder is unremarkable. Stomach/Bowel: Stomach, large and small bowel grossly unremarkable. Lymphatic: No adenopathy Reproductive: Changes of hysterectomy. Large hematoma the pelvis measuring 14.1 x 11.4 x 8.9 cm. No active extravasation of contrast seen. Other: In addition to the large pelvic hematoma, there is moderate free fluid in the abdomen and pelvis and moderate to large free air. Musculoskeletal: No acute bony abnormality. IMPRESSION: VASCULAR Aorta and branch vessels unremarkable. No evidence of active contrast  extravasation. NON-VASCULAR Changes of hysterectomy. Large pelvic hematoma measuring up to 14 cm in craniocaudal length. This is also accompanied by moderate free fluid throughout the abdomen and pelvis and moderate to large amount of free air. These results were called by telephone at the time of interpretation on 07/17/2022 at 11:42 pm to provider [facility] , who verbally acknowledged these results. Electronically Signed   By: Charlett Nose M.D.   On: 07/17/2022 23:42   CT HEAD WO CONTRAST ( )  Result Date: 07/17/2022 CLINICAL DATA:  Head trauma, coagulopathy (Age 33-64y) fall after ambulation post op, hit head on wall, given pre op lovenox EXAM: CT HEAD WITHOUT CONTRAST TECHNIQUE: Contiguous axial images were obtained from the base of the skull through the vertex without intravenous contrast. RADIATION DOSE REDUCTION: This exam was performed according to the departmental dose-optimization program which includes automated exposure control, adjustment of the mA and/or kV according to patient size and/or use of iterative reconstruction technique. COMPARISON:  None Available. FINDINGS: Brain: No evidence of acute infarction, hemorrhage, hydrocephalus, extra-axial collection or mass lesion/mass effect. Vascular: No hyperdense vessel. Skull: No acute fracture. Sinuses/Orbits: Clear sinuses.  No acute orbital findings. Other: No mastoid effusions. IMPRESSION: No evidence of acute intracranial abnormality. Electronically Signed   By: Feliberto Harts M.D.   On: 07/17/2022 15:39        Scheduled Meds:  sodium chloride   Intravenous Once   acetaminophen  1,000 mg Oral Q6H   Chlorhexidine Gluconate Cloth  6 each Topical Q0600   insulin pump   Subcutaneous Q4H   pantoprazole (PROTONIX) IV  40 mg Intravenous Q24H   sacubitril-valsartan  1 tablet Oral BID   venlafaxine XR  37.5 mg Oral Q breakfast   Continuous Infusions:  lactated ringers Stopped (  07/19/22 0839)   promethazine (PHENERGAN) injection (IM  or IVPB) Stopped (07/17/22 2342)     LOS: 1 day    Time spent: 51 minutes spent on chart review, discussion with nursing staff, consultants, updating family and interview/physical exam; more than 50% of that time was spent in counseling and/or coordination of care.    Alvira Philips [place], DO [facility] Available via Epic secure chat 7am-7pm After these hours, please refer to coverage provider listed on [email or web address] 07/19/2022, 8:57 AM

## 2022-07-19 NOTE — Inpatient Diabetes Management (Signed)
Inpatient Diabetes Program Recommendations  AACE/ADA: New Consensus Statement on Inpatient Glycemic Control (2015)  Target Ranges:  Prepandial:   less than 140 mg/dL      Peak postprandial:   less than 180 mg/dL (1-2 hours)      Critically ill patients:  140 - 180 mg/dL   Lab Results  Component Value Date   GLUCAP 94 07/19/2022   HGBA1C 8.9 (H) 09/30/2019    Review of Glycemic Control  Diabetes history: DM1 Outpatient Diabetes medications: OmniPod Current orders for Inpatient glycemic control: Insulin pump  Excellent glucose control  Inpatient Diabetes Program Recommendations:    Pt has signed contract and recording boluses for RN.  Excellent control.  Spoke with pt and husband at bedside late on 4/25. No issues with pump management.  Has all supplies for insulin pump.  Questions answered.  Thank you. Ailene Ards, RD, LDN, CDCES Inpatient Diabetes Coordinator [phone]

## 2022-07-19 NOTE — Progress Notes (Signed)
    Patient Name: Stephanie Holden           DOB: Jul 29, 1975  MRN: [id number]      Admission Date: 07/17/2022  Attending Provider: Jerene Bears, MD  Primary Diagnosis: Postmenopausal bleeding   Level of care: Stepdown    CROSS COVER NOTE   Date of Service   07/19/2022   Stephanie Holden, 47 y.o. female, was admitted on 07/17/2022 for Postmenopausal bleeding.    HPI/Events of Note   RN reports hypotension, SBP 80s, MAP<65.   No bleeding reported.  Hemoglobin 7.5--> 8.8 this morning.  Patient received Entresto earlier tonight, likely causing hypotension.  Day team to consider holding antihypertensive medication.    Interventions/ Plan   Fluid bolus, 500 cc        Anthoney Harada, DNP, ACNPC- AG [facility] [place]

## 2022-07-19 NOTE — Discharge Instructions (Addendum)
CARDIOLOGY RECOMMENDATIONS: Check home blood pressure numbers.  -If numbers are 120 or less on the top, would not restart medications.  -If numbers are 120-140 with heart rates >70, would restart carvedilol.  -If BP >140 and HR >70, would restart carvedilol and spironolactone.  -If BP >160, call cardiology clinic.  Post Op Hysterectomy Instructions Please read the instructions below. Refer to these instructions for the next few weeks. These instructions provide you with general information on caring for yourself after surgery. Your caregiver may also give you specific instructions. While your treatment has been planned according to the most current medical practices available, unavoidable problems sometimes happen. If you have any problems or questions after you leave, please call your caregiver.  HOME CARE INSTRUCTIONS Healing will take time. You will have discomfort, tenderness, swelling and bruising at the operative site for a couple of weeks. This is normal and will get better as time goes on.  Only take over-the-counter or prescription medicines for pain, discomfort or fever as directed by your caregiver.  Do not take aspirin. It can cause bleeding.  Do not drive when taking pain medication.  Follow your caregiver's advice regarding diet, exercise, lifting, driving and general activities.  Resume your usual diet as directed and allowed.  Get plenty of rest and sleep.  Do not douche, use tampons, or have sexual intercourse until your caregiver gives you permission. .  Take your temperature if you feel hot or flushed.  You may shower today when you get home.  No tub bath for one week.   Do not drink alcohol until you are not taking any narcotic pain medications.  Try to have someone home with you for a week or two to help with the household activities.   Be careful over the next two to three weeks with any activities at home that involve lifting, pushing, or pulling.  Listen to your body--if  something feels uncomfortable to do, then don't do it. Make sure you and your family understands everything about your operation and recovery.  Walking up stairs is fine. Do not sign any legal documents until you feel normal again.  Keep all your follow-up appointments as recommended by your caregiver.   PLEASE CALL THE OFFICE IF: There is swelling, redness or increasing pain in the wound area.  Pus is coming from the wound.  You notice a bad smell from the wound or surgical dressing.  You have pain, redness and swelling from the intravenous site.  The wound is breaking open (the edges are not staying together).   You develop pain or bleeding when you urinate.  You develop abnormal vaginal discharge.  You have any type of abnormal reaction or develop an allergy to your medication.  You need stronger pain medication for your pain   SEEK IMMEDIATE MEDICAL CARE: You develop a temperature of 100.5 or higher.  You develop abdominal pain.  You develop chest pain.  You develop shortness of breath.  You pass out.  You develop pain, swelling or redness of your leg.  You develop heavy vaginal bleeding with or without blood clots.   MEDICATIONS: Restart your regular medications BUT wait one week before restarting all vitamins and mineral supplements Once have regular bowel movements, start a daily iron supplement. Use Motrin 600mg  every 8 hours as needed for pain.  Can alternate with Tylenol 650mg  every 6 hours.   I did send in a prescription for Percocet as well.  If you take this, don't take any  additional tylenol for 6 hours. You may use an over the counter stool softener like Colace or Dulcolax to help with starting a bowel movement.  Start the day after you go home.  Warm liquids, fluids, and ambulation help too.  If you have not had a bowel movement in four days, you need to call the office.

## 2022-07-19 NOTE — Progress Notes (Signed)
SBP soft in the 80's occasionally. Give 500 mL fluid bolus per on-call NP.

## 2022-07-19 NOTE — Progress Notes (Signed)
1 Day Post-Op Procedure(s) (LRB): OPERATIVE LAPAROSCOPIC EVACUATION OF POST OPERATIVE HEMATOMA (N/A)  Subjective: Patient reports she's been able to void and has good pain control.  Has eaten regular diet and also has been up walking.  Doing well with all of these.  No nausea.  + flatus.  Was seen by cardiology with clear recommendations about restarting medications.    Objective: I have reviewed patient's vital signs and intake and output.    07/19/2022    3:00 PM 07/19/2022    2:00 PM 07/19/2022    1:42 PM  Vitals with BMI  Systolic 107 112 308  Diastolic 48 48 83  Pulse 67 77 96    Hb @ noon was 9.3  General: alert and no distress Resp: clear to auscultation bilaterally Cardio: regular rate and rhythm, S1, S2 normal, no murmur, click, rub or gallop GI: soft, non-tender; bowel sounds normal; no masses,  no organomegaly Extremities: extremities normal, atraumatic, no cyanosis or edema Vaginal Bleeding: none  Assessment: s/p Procedure(s): OPERATIVE LAPAROSCOPIC EVACUATION OF POST OPERATIVE HEMATOMA (N/A): progressing well  Plan: Feel pt is meeting all criteria for discharge home.  Has tolerated regular diet, walking, voiding, good pain control, no nausea.  LOS: 1 day    Jerene Bears, MD 07/19/2022, 3:50 PM

## 2022-07-19 NOTE — Progress Notes (Signed)
1 Day Post-Op Procedure(s) (LRB): OPERATIVE LAPAROSCOPIC EVACUATION OF POST OPERATIVE HEMATOMA (N/A)  Subjective: Patient reports feeling hungry.  Passing gas.  No nausea.  Taking only tylenol for pain.  No SOB.  No palpitations.  A little concerned about blood pressure.  Sherryll Burger was restarted last night.  Given 500cc fluid bolus due to hypotension.  Would like to try and be up today.  Received 1 additional unit of blood last night.  Hb 7.5 to 8.8 this morning.  Reviewed with pt.  Objective: I have reviewed patient's vital signs, intake and output, medications, and labs. Vitals:   07/19/22 0200 07/19/22 0300  BP: (!) 116/43 (!) 90/45  Pulse: 93 72  Resp: 17 13  Temp:    SpO2: 99% 100%    General: alert and no distress Resp: clear to auscultation bilaterally Cardio: regular rate and rhythm, S1, S2 normal, no murmur, click, rub or gallop GI: soft, non-tender; bowel sounds normal; no masses,  no organomegaly Extremities: extremities normal, atraumatic, no cyanosis or edema Vaginal Bleeding: none  Assessment: s/p Procedure(s): OPERATIVE LAPAROSCOPIC EVACUATION OF POST OPERATIVE HEMATOMA (N/A): stable and progressing well  Plan: Advance diet Encourage ambulation D/C foley Restarting Effexor Will watch blood pressures and hopefully restart entresto Cardiology consult today.  Placed yesterday AM.  Spoke with Reuel Boom, Fellow who was on Wednesday night that I would place in AM.  Was not seen yesterday. Continue SCDs and protonix Hopefully D/C IV today Will likely transfer out of step down later today.  LOS: 1 day    Jerene Bears, MD 07/19/2022, 7:26 AM

## 2022-07-21 LAB — BPAM RBC
Blood Product Expiration Date: 202405242359
Blood Product Expiration Date: 202405262359
Blood Product Expiration Date: 202405262359
ISSUE DATE / TIME: 202404250124
ISSUE DATE / TIME: 202404261042
Unit Type and Rh: 5100
Unit Type and Rh: 5100
Unit Type and Rh: 6200

## 2022-07-21 LAB — TYPE AND SCREEN
ABO/RH(D): O POS
Antibody Screen: NEGATIVE
Unit division: 0
Unit division: 0
Unit division: 0

## 2022-07-21 NOTE — Discharge Summary (Signed)
Patient ID: Stephanie Holden [id number]  Admit date: 07/17/2022  5:34 AM  Discharge date: 07/19/2022  5:00 PM  Admission Diagnoses: Postmenopausal bleeding, endometrial lesion, malpositioned Essure device, History of breast cancer, high risk medication use, history of heart failure after chemotherapy  Discharge Diagnoses:  Above and Pelvic hematoma, female [N94.89] S/P evacuation of hematoma [Z98.890]  Discharged Condition: good  Hospital Course: Patient admitted through same day surgery.  She was taken to the OR where TLH/BSO with cystoscopy was performed.  EBL was 100.  Surgery was without complications.  Patient transferred to the PACU and then to the [facility].    Pt's recovered was complicated first by an orthostatic event where she became syncopal and hit her head on the wall when nursing staff was unable to catch her completely when falling.  Hb was checked and was 12.6.  Head CT obtained and was normal.  A few hours later, pt experience similar event without full syncopal episode.  EKG was reassuring.  Due to hx of heart failure with chemotherapy after breast cancer diagnosis in 2019, cardiology was called.  Fellow was on-call provider.  He felt full consult could be placed in AM but nothing needed additional for evaluation.  After third event and worsening pain, hb repeated and was 10.8.  Then, CT angiogram was obtained showing significant pelvic hematoma with free fluid/blood.  No active bleeding was noted.  Decision was made to take pt back to the OR for exploration.  This was done and large hematoma was present with blood in pericolic gutters as well.  This was evacuated and blood removed as much as possible.  Entire surgical site was visualized carefully and no active bleeding was noted.  Small area of oozing noted at bladder flap dissection.  This was hemostatic with cautery.  Then Surgicel powder placed on all pedicles.  Pressures from Co2 gass lowered and again no bleeding noted.   Lastly, arista powder placed along the surgical site.  Final visualization of surgical site showed no active bleeding.  In the OR, pt was aggressive hydrated with NS, LR, Albumin and 1 unit of PRBCs.  She was then taken to the PACU and then stepdown unit at [facility].  Cardiology and Hospitalist consults placed.  Blood pressure medications held.  Electrolytes monitored.  Small increase in creatinine was present.  As well, Hb was monitored but continued to slowly trend downward.  However, pt clinically was much improved.  By the afternoon of POD #1 from TLH/POD#0 from laparoscopy, she was awake and alert with soft abdomen, stable vital signs, excellent UOP and normal bowel sounds.  However, due to trending downward hb (to 7.4), IR called.  Provider felt intervention not warranted and risks outweighed benefit given improved clinical picture and no active bleeding on imaging from prior evening.  Recommended repeating CT angiogram for confirmation of no active bleeding.  Imaging showed residual 3 x 4 x 6cm hematoma but no active bleeding or free fluid.  Pt given additional unit of PRBCs.  Pt did have lower blood pressures through the night after Entresto was restarted by Hospitalist team.  Fluid bolus given overnight.  All anti-hypertensive medications again held the next morning.  Cardiology consult reordered as was not completed the day before and I personally spoke with PA regarding questions for restarting medications.  By afternoon of POD #2 from TLH/POD #1 from laparoscopy, foley was out and she was voiding well, she had eaten regular diet without nausea, she  had normal flatus, she had excellent pain control with tylenol only and had walked in the halls. Also, floor for the remainder of her hospitalization.  As well, hb had stabilized and improved to 9.3.  Given rapid improvement in clinical picture as well as normalized creatinine and improved hb, I felt discharge home was reasonable.     Consults:  cardiology and hospitalist  Significant Diagnostic Studies: CT angiogram abd/pelvis x 2, CT of head, serial blood work  Treatments: surgery: TLH/BSO/Cystoscopy and then later laparoscopy with evacuation of hematoma/blood  Discharge Exam: See last exam in POD#1 note.  Disposition: Home or Self Care  Signed: Lum Keas 10/10/2010, 6:26 PM

## 2022-07-22 NOTE — Anesthesia Postprocedure Evaluation (Signed)
Anesthesia Post Note  Patient: Nalee Lightle Majerus  Procedure(s) Performed: OPERATIVE LAPAROSCOPIC EVACUATION OF POST OPERATIVE HEMATOMA (Abdomen)     Patient location during evaluation: PACU Anesthesia Type: General Level of consciousness: awake and alert Pain management: pain level controlled Vital Signs Assessment: post-procedure vital signs reviewed and stable Respiratory status: spontaneous breathing, nonlabored ventilation, respiratory function stable and patient connected to nasal cannula oxygen Cardiovascular status: blood pressure returned to baseline and stable Postop Assessment: no apparent nausea or vomiting Anesthetic complications: no   No notable events documented.  Last Vitals:  Vitals:   07/19/22 1500 07/19/22 1600  BP: (!) 107/48 99/76  Pulse: 67 69  Resp: 16 (!) 28  Temp:    SpO2: 93% 94%    Last Pain:  Vitals:   07/19/22 1200  TempSrc: Oral  PainSc: 5                  Kennieth Rad

## 2022-07-23 ENCOUNTER — Encounter (HOSPITAL_BASED_OUTPATIENT_CLINIC_OR_DEPARTMENT_OTHER): Payer: 59 | Admitting: Obstetrics & Gynecology

## 2022-07-23 ENCOUNTER — Encounter (HOSPITAL_BASED_OUTPATIENT_CLINIC_OR_DEPARTMENT_OTHER): Payer: Self-pay | Admitting: Obstetrics & Gynecology

## 2022-07-26 ENCOUNTER — Ambulatory Visit (INDEPENDENT_AMBULATORY_CARE_PROVIDER_SITE_OTHER): Payer: 59 | Admitting: Obstetrics & Gynecology

## 2022-07-26 VITALS — BP 128/80 | HR 78 | Resp 14

## 2022-07-26 DIAGNOSIS — Z9889 Other specified postprocedural states: Secondary | ICD-10-CM

## 2022-07-28 ENCOUNTER — Encounter (HOSPITAL_BASED_OUTPATIENT_CLINIC_OR_DEPARTMENT_OTHER): Payer: Self-pay | Admitting: Obstetrics & Gynecology

## 2022-07-28 NOTE — Progress Notes (Signed)
GYNECOLOGY  VISIT  CC:   post op recheck  HPI: 47 y.o. G3P1112 Married White or Caucasian female here for recheck after undergoing TLH/BSO/cystoscopy on 07/17/2022 and then going back to the OR for evacuation of pelvic hematoma from post op bleeding.  She did spend about a day and a half in the step down unit after second procedure.  Reports she is doing well.  Did really feel the extra fluid go away in the past few days.  Blood pressures are not elevated, still.  Monitoring carefully and really on on any blood pressure medication at this time.  Using about 1/2 tab percocet daily.  Pain under good control.  Voiding and having regular bowel movements.  Took about 4 days to really get started normally.  She reports bleeding is  none .   Pathology reviewed:  Yes .  Questions answered.    MEDS:   Current Outpatient Medications on File Prior to Visit  Medication Sig Dispense Refill   amphetamine-dextroamphetamine (ADDERALL XR) 20 MG 24 hr capsule Take 20 mg by mouth every morning.     carvedilol (COREG) 6.25 MG tablet TAKE 1 TABLET BY MOUTH TWICE DAILY WITH A MEAL (Patient taking differently: Take 6.25 mg by mouth in the morning and at bedtime.) 180 tablet 3   Continuous Blood Gluc Sensor (DEXCOM G6 SENSOR) MISC Inject 1 Device into the skin See admin instructions. Place 1 new sensor into the skin every 10 days     CONTOUR TEST test strip USE TO TEST BLOOD SUGARS 6 TIMES DAILY ON PUMP DX  E10.9     furosemide (LASIX) 40 MG tablet TAKE 1 TABLET BY MOUTH EVERY DAY AS NEEDED FOR FLUID/EDEMA (Patient taking differently: Take 40 mg by mouth daily as needed for fluid or edema.) 90 tablet 3   Insulin Disposable Pump (OMNIPOD 5 G6 POD, GEN 5,) MISC Inject 1 Device into the skin every 3 (three) days.     insulin lispro (HUMALOG) 100 UNIT/ML injection Inject into the skin. Per Omnipod 5 G6- Continuous- Target BGL is 120     KLOR-CON M20 20 MEQ tablet TAKE 1/2 TABLET BY MOUTH EVERY DAY AS NEEDED WITH FUROSEMIDE  (Patient taking differently: Take 10 mEq by mouth See admin instructions. Take 10 mEq by mouth once a day when taking Furosemide) 45 tablet 2   LORazepam (ATIVAN) 0.5 MG tablet TAKE 1 TABLET BY MOUTH AT BEDTIME AS NEEDED FOR ANXIETY (Patient taking differently: Take 0.5 mg by mouth daily as needed for anxiety or sleep. TAKE 1 TABLET BY MOUTH AT BEDTIME AS NEEDED FOR ANXIETY) 30 tablet 3   Multiple Vitamin (MULTIVITAMIN WITH MINERALS) TABS tablet Take 1 tablet by mouth daily with breakfast.     pravastatin (PRAVACHOL) 20 MG tablet Take 20 mg by mouth at bedtime.     Probiotic Product (PROBIOTIC PO) Take 1 capsule by mouth daily.     sacubitril-valsartan (ENTRESTO) 97-103 MG Take 1 tablet by mouth 2 (two) times daily. 180 tablet 3   spironolactone (ALDACTONE) 25 MG tablet TAKE 1/2 TABLET BY MOUTH EVERY DAY (Patient taking differently: Take 12.5 mg by mouth daily.) 45 tablet 3   venlafaxine XR (EFFEXOR-XR) 37.5 MG 24 hr capsule Take 37.5 mg by mouth daily with breakfast.     Current Facility-Administered Medications on File Prior to Visit  Medication Dose Route Frequency Provider Last Rate Last Admin   acetaminophen (TYLENOL) tablet 650 mg  650 mg Oral Q6H PRN Chrzanowski, Jami B, NP  SH:  Smoking No    PHYSICAL EXAMINATION:   BP 128/80   Pulse 78   Resp 14     General appearance: alert, cooperative and appears stated age CV:  Regular rate and rhythm Lungs:  clear to auscultation, no wheezes, rales or rhonchi, symmetric air entry Abdomen: soft, non-tender; bowel sounds normal; no masses,  no organomegaly Incisions:  C/D/I  Pelvic: deferred due to no bleeding  Assessment/Plan: 1. Post-operative state - doing well.  Will recheck in about 4 weeks.  Limitations reviewed. - she does have follow up with Dr. Teressa Lower, cardiology, on 5/8 as well.

## 2022-07-30 ENCOUNTER — Telehealth: Payer: Self-pay

## 2022-07-30 DIAGNOSIS — C50611 Malignant neoplasm of axillary tail of right female breast: Secondary | ICD-10-CM

## 2022-07-30 NOTE — Progress Notes (Signed)
Advanced Heart Failure Clinic Note   Referring Physician: Dr Pamelia Hoit PCP: Tisovec, Adelfa Koh, MD PCP-Cardiologist: Arvilla Meres, MD   HPI: Stephanie Holden is a 47 y.o. female with a history of type 1 DM on insulin pump, thyroid goiter, and breast cancer.      Malignant neoplasm of axillary tail of right breast in female, estrogen receptor positive (HCC)    05/09/2017 Initial Diagnosis      May 2018: Palpable right axillary mass.  The recommended a 6-week follow-up but apparently the mass decreased in size.  Recent increase in the mass was noted, biopsy revealed Grade 1 IDC with DCIS, lymphovascular invasion present, 4 small hypoechoic masses largest 0.9 cm, T1BN1 stage Ib clinical stage         06/05/2017 Surgery      Bilateral mastectomies: Right mastectomy: IDC grade 1, 3 foci 1.2 cm, 1.2 cm, 1 cm, IgA DCIS, LVH diffuse present, margins negative, 4/7 lymph nodes positive with extracapsular extension, ER 95%, PR 95%, HER-2 negative, Ki-67 2% to 10%, T1 cN2 aM0 stage II a pathological stage; left mastectomy: Benign         06/26/2017 Surgery      Axillary lymph node dissection: 2/22 lymph nodes positive (making a total of 6+ lymph nodes)         07/22/2017 -  Chemotherapy      Adjuvant chemotherapy with dose dense Adriamycin and Cytoxan x4 followed by Taxol weekly x12   We saw her 6/19 for the first time in the cardio-oncology clinic. She had recently completed chemo and echo showed EF 60-65%. Admitted in 10/19 with PNA but then found to have systolic HF with EF 25-30%. Course c/b large R pleural effusion. Initially treated with thoracentesis on 01/13/18 but then recurred so chest tube was placed. Effusion found to be transudative with negative cytology.   Past HF follow up she had lost 45 pounds with [place] program. Active. No SOB, CP, orthopnea or PND.  07/17/22: Underwent TLH/BSO with cystoscopy. Procedure went well but after patient proceeded to have several syncopal episodes, fell  and hit her head during one event, CT head was (-). Imaging revealed a significant pelvic hematoma. Was taken back to the OR where she underwent lap with hematoma evacuation. Syncope resolved after this. Repeat pelvic CT showed no active bleeding. Discharged home POD #2 and was stable at f/u with OBGYN.  Today she returns for post hospital follow up. Overall feeling good. Denies palpitations, CP, dizziness, edema, or PND/Orthopnea. No SOB. Appetite good, watches what she eats. No fever or chills. Weight at home 143 pounds. Taking all medications.  SBP 120s-110s. Last took PRN lasix around 4 months ago. 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   Cardiac studies reviewed:  Echo 12/21/21: EF 55-60% GLS -17.3% Echo 04/28/20 EF 60-65% RV normal Personally reviewed Echo 7/21 EF 50-55% GLS -17.1% CMRI 07/12/19 as part of Upbeat study, EF 47%   CT cors 12/20: Calcium score 0. No CAD  cMRI 12/19 1.  Normal LV size with moderate diffuse hypokinesis, EF 37%. 2.  Normal RV size with mildly decreased systolic function, EF 43%. 3. No myocardial LGE, so no definitive evidence for prior myocardial infarction, infiltrative disease, or myocarditis.  ECHO 09/10/2018 EF 40-45% GLS 17.6.  Echo 01/28/18 EF 25-30% Grade II DD. Mild RV dysfunction Echo 6/19 EF 60-65% LS 11.90 GLS 18.7%  Review of systems complete and found to be negative unless listed in HPI.  Past Medical History:  Diagnosis Date   Cancer [facility])    Breast cancer - right   CHF (congestive heart failure) (HCC) 2020   chemo induced cardiomyopathy, 12/21/21 echocardiogram EF 55-60%   Constipation    resolved 07/09/22 per pt   Depression    Follows w/ Dr. Pamelia Hoit, oncologist.   Genetic testing 05/27/2017   Breast/GYN panel (23 genes) @ [facility] - No pathogenic mutations detected   GERD (gastroesophageal reflux disease)    resolved per patient as of 07/09/2022   History of chemotherapy 2019   Follows w/ Dr. Pamelia Hoit @ [facility].   History of radiation therapy 12/30/17- 02/10/18   Right chest wall/ 50 Gy in 25 fractions, Right SCV, PAB nodes/ 50 Gy in 25 fractions, Right Chest wall boost 10 gy in 5 fractions.    Insulin dependent type 1 diabetes mellitus (HCC)    Follows w/ Dr. Darral Dash, endocronologist.   Insulin pump in place    Pleural effusion 01/13/2018   Pneumonia 01/12/2018   PONV (postoperative nausea and vomiting)    one time per pt   Stenosing tenosynovitis of thumb 06/2014   right   Thyroid goiter    Wears contact lenses    Wears glasses     Current Outpatient Medications  Medication Sig Dispense Refill   carvedilol (COREG) 6.25 MG tablet TAKE 1 TABLET BY MOUTH TWICE DAILY WITH A MEAL (Patient taking differently: Take 6.25 mg by mouth in the morning and at bedtime.) 180 tablet 3   Continuous Blood Gluc Sensor (DEXCOM G6 SENSOR) MISC Inject 1 Device into the skin See admin instructions. Place 1 new sensor into the skin every 10 days     CONTOUR TEST test strip USE TO TEST BLOOD SUGARS 6 TIMES DAILY ON PUMP DX  E10.9     furosemide (LASIX) 40 MG tablet TAKE 1 TABLET BY MOUTH EVERY DAY AS NEEDED FOR FLUID/EDEMA 90 tablet 3   Insulin Disposable Pump (OMNIPOD 5 G6 POD, GEN 5,) MISC Inject 1 Device into the skin every 3 (three) days.     insulin lispro (HUMALOG) 100 UNIT/ML injection Inject into the skin. Per Omnipod 5 G6- Continuous- Target BGL is 120     LORazepam (ATIVAN) 0.5 MG tablet Take 1 tablet by mouth every 8 (eight) hours.     Multiple Vitamin (MULTIVITAMIN WITH MINERALS) TABS tablet Take 1 tablet by mouth daily with breakfast.     Potassium Chloride (KLOR-CON PO) Take by mouth. 20 meq. Patient takes 1/2 tablet daily.     pravastatin (PRAVACHOL) 20 MG tablet Take 20 mg by mouth at bedtime.     Probiotic Product (PROBIOTIC PO) Take 1 capsule by mouth daily.     sacubitril-valsartan (ENTRESTO) 97-103 MG Take 1 tablet by mouth 2 (two) times daily. 180 tablet 3   spironolactone  (ALDACTONE) 25 MG tablet TAKE 1/2 TABLET BY MOUTH EVERY DAY (Patient taking differently: Take 12.5 mg by mouth daily.) 45 tablet 3   venlafaxine XR (EFFEXOR-XR) 37.5 MG 24 hr capsule Take 37.5 mg by mouth daily with breakfast.     Current Facility-Administered Medications  Medication Dose Route Frequency Provider Last Rate Last Admin   acetaminophen (TYLENOL) tablet 650 mg  650 mg Oral Q6H PRN Chrzanowski, Jami B, NP        Allergies  Allergen Reactions   Dilaudid [Hydromorphone Hcl] Itching and Other (See Comments)    "UNCONTROLLABLE CRYING," also      Social History   Socioeconomic  History   Marital status: Married    Spouse name: Not on file   Number of children: Not on file   Years of education: Not on file   Highest education level: Not on file  Occupational History   Not on file  Tobacco Use   Smoking status: Former    Packs/day: 0.13    Years: 2.00    Additional pack years: 0.00    Total pack years: 0.26    Types: Cigarettes    Quit date: 03/24/2001    Years since quitting: 21.3   Smokeless tobacco: Never  Vaping Use   Vaping Use: Never used  Substance and Sexual Activity   Alcohol use: Yes    Comment: about 1 per month   Drug use: Yes    Types: Marijuana    Comment: last used 6 years ago as of 07/09/22 per pt   Sexual activity: Yes    Partners: Male    Birth control/protection: Surgical    Comment: tubal ligation & 1 tube removed, menarche 47yo, sexual debut 47yo  Other Topics Concern   Not on file  Social History Narrative   Not on file   Social Determinants of Health   Financial Resource Strain: Not on file  Food Insecurity: Not on file  Transportation Needs: No Transportation Needs (12/22/2017)   PRAPARE - Administrator, Civil Service (Medical): No    Lack of Transportation (Non-Medical): No  Physical Activity: Not on file  Stress: Not on file  Social Connections: Not on file  Intimate Partner Violence: Not At Risk (12/22/2017)    Humiliation, Afraid, Rape, and Kick questionnaire    Fear of Current or Ex-Partner: No    Emotionally Abused: No    Physically Abused: No    Sexually Abused: No    Family History  Problem Relation Age of Onset   Hypertension Mother    Hypertension Father    Breast cancer Other 28       paternal grandfather's niece    Vitals:   07/31/22 1436  BP: 136/70  Pulse: 93  SpO2: 99%  Weight: 66 kg (145 lb 9.6 oz)    Wt Readings from Last 3 Encounters:  07/31/22 66 kg (145 lb 9.6 oz)  07/18/22 58.3 kg (128 lb 8.5 oz)  07/15/22 65.3 kg (144 lb)    PHYSICAL EXAM: General:  well appearing.  No respiratory difficulty HEENT: normal Neck: supple. JVD flat. Carotids 2+ bilat; no bruits. No lymphadenopathy or thyromegaly appreciated. Cor: PMI nondisplaced. Regular rate & rhythm. No rubs, gallops or murmurs. Lungs: clear Abdomen: soft, nontender, nondistended. No hepatosplenomegaly. No bruits or masses. Good bowel sounds. Extremities: no cyanosis, clubbing, rash, edema  Neuro: alert & oriented x 3, cranial nerves grossly intact. moves all 4 extremities w/o difficulty. Affect pleasant.   EKG: NSR 94 bpm (Personally reviewed)    ASSESSMENT & PLAN: 1. 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 No CAD - echo 6/19 EF 60% - echo 11/19 EF 25-30% - cMRI 12/19 EF 37%. No LGE. Out of window for ICD. - ECHO 12/21 EF 55% GLS - 17.1% - cMRI 4/21 as part of Upbeat study EF 47% - Echo  09/30/19 EF 50-55% GLS -17.5% -> EF improved and stabilized. - Echo 04/28/20 EF 60-65% - Echo today  12/21/21 EF 55-60% GLS -17.3% - NYHA I. Volume status looks good. Continue prn lasix - Has been off Entresto, spiro and coreg since 4/24 with procedure and instructions not to restart until f/u with AHF clinic.  - restart  Entresto 24-26 (was on 97/103 PTA) - restart coreg 3.125 mg BID (was on 6.25 mg PTA) - plan to add spiro next  - No Farxiga due to Type I DM - update echo with next MD visit  2. Malignant neoplasm of axillary tail of right breast in female, estrogen receptor positive  - Diagnosed with breast cancer 05/09/17. Underwent bilateral mastectomies on 06/05/17. Underwent axillary lymph node dissection on 06/26/17. Started chemo 07/22/17 with adriamycin and cytoxan x4, then taxol weekly x12. Finished XRT on 02/09/18 - update echo with next MD visit   3. Hyperlipidemia - Continue prava per her Endocrinologist  4. Type I, DM - Follows with Dr. Wylene Simmer - A1c down to 7.2%  Follow up in 4 weeks with PharmD, plan to increase GDMT and add spiro as tolerated. F/u 4 months with Dr. Gala Romney with echo  Brynda Peon, AGACNP-BC  2:50 PM  Advanced Heart Failure Clinic [facility] [address] [facility] [place] [address] [phone] (office) [phone] (fax)

## 2022-07-30 NOTE — Telephone Encounter (Signed)
[id number] Understanding and Predicting Breast Cancer Events after Treatment (UPBEAT)   Called the patient to complete their yearly follow up for the UPBEAT study. Left a voicemail with my number and instructs to call back when they are available to discuss the study.   Felecia Jan, [facility] 07/30/2022 4:50 PM

## 2022-07-31 ENCOUNTER — Ambulatory Visit (HOSPITAL_COMMUNITY)
Admit: 2022-07-31 | Discharge: 2022-07-31 | Disposition: A | Discharge status: Home / Self Care | Payer: 59 | Attending: Family Medicine | Admitting: Family Medicine

## 2022-07-31 ENCOUNTER — Encounter (HOSPITAL_COMMUNITY): Payer: Self-pay

## 2022-07-31 VITALS — BP 136/70 | HR 93 | Wt 145.6 lb

## 2022-07-31 DIAGNOSIS — F32A Depression, unspecified: Secondary | ICD-10-CM | POA: Insufficient documentation

## 2022-07-31 DIAGNOSIS — Z923 Personal history of irradiation: Secondary | ICD-10-CM | POA: Insufficient documentation

## 2022-07-31 DIAGNOSIS — E785 Hyperlipidemia, unspecified: Secondary | ICD-10-CM | POA: Insufficient documentation

## 2022-07-31 DIAGNOSIS — C50611 Malignant neoplasm of axillary tail of right female breast: Secondary | ICD-10-CM

## 2022-07-31 DIAGNOSIS — E109 Type 1 diabetes mellitus without complications: Secondary | ICD-10-CM | POA: Insufficient documentation

## 2022-07-31 DIAGNOSIS — Z794 Long term (current) use of insulin: Secondary | ICD-10-CM | POA: Insufficient documentation

## 2022-07-31 DIAGNOSIS — Z853 Personal history of malignant neoplasm of breast: Secondary | ICD-10-CM | POA: Insufficient documentation

## 2022-07-31 DIAGNOSIS — Z79899 Other long term (current) drug therapy: Secondary | ICD-10-CM | POA: Insufficient documentation

## 2022-07-31 DIAGNOSIS — Z8249 Family history of ischemic heart disease and other diseases of the circulatory system: Secondary | ICD-10-CM | POA: Diagnosis not present

## 2022-07-31 DIAGNOSIS — Z9641 Presence of insulin pump (external) (internal): Secondary | ICD-10-CM | POA: Diagnosis not present

## 2022-07-31 DIAGNOSIS — Z90722 Acquired absence of ovaries, bilateral: Secondary | ICD-10-CM | POA: Insufficient documentation

## 2022-07-31 DIAGNOSIS — I5022 Chronic systolic (congestive) heart failure: Secondary | ICD-10-CM | POA: Insufficient documentation

## 2022-07-31 DIAGNOSIS — Z9013 Acquired absence of bilateral breasts and nipples: Secondary | ICD-10-CM | POA: Insufficient documentation

## 2022-07-31 DIAGNOSIS — I427 Cardiomyopathy due to drug and external agent: Secondary | ICD-10-CM | POA: Diagnosis not present

## 2022-07-31 DIAGNOSIS — Z87891 Personal history of nicotine dependence: Secondary | ICD-10-CM | POA: Diagnosis not present

## 2022-07-31 DIAGNOSIS — Z170 Estrogen receptor positive status [ER+]: Secondary | ICD-10-CM

## 2022-07-31 LAB — BASIC METABOLIC PANEL
Anion gap: 7 (ref 5–15)
BUN: 16 mg/dL (ref 6–20)
CO2: 28 mmol/L (ref 22–32)
Calcium: 9.2 mg/dL (ref 8.9–10.3)
Chloride: 103 mmol/L (ref 98–111)
Creatinine, Ser: 0.79 mg/dL (ref 0.44–1.00)
GFR, Estimated: >60 mL/min (ref >60)
Glucose, Bld: 159 mg/dL — ABNORMAL HIGH (ref 70–99)
Potassium: 4.5 mmol/L (ref 3.5–5.1)
Sodium: 138 mmol/L (ref 135–145)

## 2022-07-31 MED ORDER — ENTRESTO 97-103 MG PO TABS: 1.0000 | ORAL_TABLET | Freq: Two times a day (BID) | ORAL | 3 refills | Status: DC

## 2022-07-31 MED ORDER — ENTRESTO 24-26 MG PO TABS: 1.0000 | ORAL_TABLET | Freq: Two times a day (BID) | ORAL | 11 refills | Status: DC

## 2022-07-31 MED ORDER — CARVEDILOL 6.25 MG PO TABS: ORAL_TABLET | ORAL | 3 refills | Status: DC

## 2022-07-31 MED ORDER — CARVEDILOL 3.125 MG PO TABS: 3.1250 mg | ORAL_TABLET | Freq: Two times a day (BID) | ORAL | 11 refills | Status: DC

## 2022-07-31 NOTE — Patient Instructions (Signed)
RESTART Entresto 24/26 mg one tab twice a day RESTART Coreg 3.125 mg one tab twice a day  Labs today We will only contact you if something comes back abnormal or we need to make some changes. Otherwise no news is good news!  Labs needed in 7-10 days  Your physician recommends that you schedule a follow-up appointment in: 3 weeks with the pharmacy team, in 4 months with Dr Gala Romney and echo  Your physician has requested that you have an echocardiogram. Echocardiography is a painless test that uses sound waves to create images of your heart. It provides your doctor with information about the size and shape of your heart and how well your heart's chambers and valves are working. This procedure takes approximately one hour. There are no restrictions for this procedure. Please do NOT wear cologne, perfume, aftershave, or lotions (deodorant is allowed). Please arrive 15 minutes prior to your appointment time.   Do the following things EVERYDAY: Weigh yourself in the morning before breakfast. Write it down and keep it in a log. Take your medicines as prescribed Eat low salt foods--Limit salt (sodium) to 2000 mg per day.  Stay as active as you can everyday Limit all fluids for the day to less than 2 liters  At the Advanced [facility] Failure Clinic, you and your health needs are our priority. As part of our continuing mission to provide you with exceptional heart care, we have created designated Provider Care Teams. These Care Teams include your primary Cardiologist (physician) and Advanced Practice Providers (APPs- Physician Assistants and Nurse Practitioners) who all work together to provide you with the care you need, when you need it.   You may see any of the following providers on your designated Care Team at your next follow up: Dr Arvilla Meres Dr Marca Ancona Dr. Marcos Eke, NP Robbie Lis, [place] [facility] [place], [place] Brynda Peon, NP Karle Plumber,  PharmD   Please be sure to bring in all your medications bottles to every appointment.    Thank you for choosing [facility]

## 2022-08-01 ENCOUNTER — Telehealth: Payer: Self-pay

## 2022-08-01 DIAGNOSIS — Z170 Estrogen receptor positive status [ER+]: Secondary | ICD-10-CM

## 2022-08-01 NOTE — Telephone Encounter (Signed)
[id number] Understanding and Predicting Breast Cancer Events after Treatment (UPBEAT)  Called the patient to complete their yearly follow up for the UPBEAT study. Left a voicemail with my number and instructs to call back when they are available to discuss the study.   Felecia Jan, [facility] 08/01/2022 3:24 PM

## 2022-08-05 ENCOUNTER — Telehealth: Payer: Self-pay

## 2022-08-05 DIAGNOSIS — C50611 Malignant neoplasm of axillary tail of right female breast: Secondary | ICD-10-CM

## 2022-08-05 NOTE — Progress Notes (Signed)
Patient Care Team: Tisovec, Adelfa Koh, MD as PCP - General (Internal Medicine) Bensimhon, Bevelyn Buckles, MD as PCP - Cardiology (Cardiology) Lonie Peak, MD as Attending Physician (Radiation Oncology) Serena Croissant, MD as Consulting Physician (Hematology and Oncology) Almond Lint, MD as Consulting Physician (General Surgery) Romualdo Bolk, MD as Consulting Physician (Obstetrics and Gynecology)  DIAGNOSIS: No diagnosis found.  SUMMARY OF ONCOLOGIC HISTORY: Oncology History  Malignant neoplasm of axillary tail of right breast in female, estrogen receptor positive (HCC)  05/09/2017 Initial Diagnosis   May 2018: Palpable right axillary mass.  The recommended a 6-week follow-up but apparently the mass decreased in size.  Recent increase in the mass was noted, biopsy revealed Grade 1 IDC with DCIS, lymphovascular invasion present, 4 small hypoechoic masses largest 0.9 cm, T1BN1 stage Ib clinical stage   06/05/2017 Surgery   Bilateral mastectomies: Right mastectomy: IDC grade 1, 3 foci 1.2 cm, 1.2 cm, 1 cm, IgA DCIS, LVH diffuse present, margins negative, 4/7 lymph nodes positive with extracapsular extension, ER 95%, PR 95%, HER-2 negative, Ki-67 2% to 10%, T1 cN2 aM0 stage II a pathological stage; left mastectomy: Benign   06/26/2017 Surgery   Axillary lymph node dissection: 2/22 lymph nodes positive (making a total of 6+ lymph nodes)   07/22/2017 - 12/09/2017 Chemotherapy   Adjuvant chemotherapy with dose dense Adriamycin and Cytoxan x4 followed by Taxol weekly x12    12/23/2017 Cancer Staging   Staging form: Breast, AJCC 8th Edition - Pathologic: Stage IB (pT1c, pN2a, cM0, G1, ER+, PR+, HER2-) - Signed by Lonie Peak, MD on 12/23/2017   12/30/2017 - 02/09/2018 Radiation Therapy   Adjuvant radiation   01/12/2018 Imaging   No PE, patchy consolidation in both lungs groundglass opacities, differential infection versus radiation pneumonitis, right pleural effusion moderate to large, left  pleural effusion moderate   01/27/2018 - 02/01/2018 Hospital Admission   Hospital admission for congestive heart failure with pleural effusion, EF 25% suspected to be related to Adriamycin   03/25/2018 -  Anti-estrogen oral therapy   Tamoxifen daily   Breast cancer metastasized to axillary lymph node, right (HCC)  06/05/2017 Initial Diagnosis   Breast cancer metastasized to axillary lymph node, right (HCC)   07/22/2017 -  Chemotherapy   Adjuvant chemotherapy with dose dense Adriamycin and Cytoxan x4 followed by Taxol weekly x12      CHIEF COMPLIANT:  Surveillance of breast cancer   INTERVAL HISTORY: Stephanie Holden is a 48 year old with above-mentioned history of bilateral mastectomies for right breast cancer. She finished adjuvant chemotherapy and radiation and is currently on surveillance and tamoxifen. She presents to the clinic for a follow-up.    ALLERGIES:  is allergic to dilaudid [hydromorphone hcl].  MEDICATIONS:  Current Outpatient Medications  Medication Sig Dispense Refill   carvedilol (COREG) 3.125 MG tablet Take 1 tablet (3.125 mg total) by mouth 2 (two) times daily. 60 tablet 11   carvedilol (COREG) 6.25 MG tablet TAKE 1 TABLET BY MOUTH TWICE DAILY WITH A MEAL 180 tablet 3   Continuous Blood Gluc Sensor (DEXCOM G6 SENSOR) MISC Inject 1 Device into the skin See admin instructions. Place 1 new sensor into the skin every 10 days     CONTOUR TEST test strip USE TO TEST BLOOD SUGARS 6 TIMES DAILY ON PUMP DX  E10.9     furosemide (LASIX) 40 MG tablet TAKE 1 TABLET BY MOUTH EVERY DAY AS NEEDED FOR FLUID/EDEMA (Patient not taking: Reported on 07/31/2022) 90 tablet 3   Insulin Disposable  Pump (OMNIPOD 5 G6 POD, GEN 5,) MISC Inject 1 Device into the skin every 3 (three) days.     insulin lispro (HUMALOG) 100 UNIT/ML injection Inject into the skin. Per Omnipod 5 G6- Continuous- Target BGL is 120     LORazepam (ATIVAN) 0.5 MG tablet Take 1 tablet by mouth every 8 (eight) hours.      Multiple Vitamin (MULTIVITAMIN WITH MINERALS) TABS tablet Take 1 tablet by mouth daily with breakfast.     Potassium Chloride (KLOR-CON PO) Take by mouth. 20 meq. Patient takes 1/2 tablet daily. (Patient not taking: Reported on 07/31/2022)     pravastatin (PRAVACHOL) 20 MG tablet Take 20 mg by mouth at bedtime.     Probiotic Product (PROBIOTIC PO) Take 1 capsule by mouth daily.     sacubitril-valsartan (ENTRESTO) 24-26 MG Take 1 tablet by mouth 2 (two) times daily. 60 tablet 11   sacubitril-valsartan (ENTRESTO) 97-103 MG Take 1 tablet by mouth 2 (two) times daily. 180 tablet 3   spironolactone (ALDACTONE) 25 MG tablet TAKE 1/2 TABLET BY MOUTH EVERY DAY (Patient not taking: Reported on 07/31/2022) 45 tablet 3   venlafaxine XR (EFFEXOR-XR) 37.5 MG 24 hr capsule Take 37.5 mg by mouth daily with breakfast.     Current Facility-Administered Medications  Medication Dose Route Frequency Provider Last Rate Last Admin   acetaminophen (TYLENOL) tablet 650 mg  650 mg Oral Q6H PRN Chrzanowski, Jami B, NP        PHYSICAL EXAMINATION: ECOG PERFORMANCE STATUS: {CHL ONC ECOG PS:[id number]}  There were no vitals filed for this visit. There were no vitals filed for this visit.  BREAST:*** No palpable masses or nodules in either right or left breasts. No palpable axillary supraclavicular or infraclavicular adenopathy no breast tenderness or nipple discharge. (exam performed in the presence of a chaperone)  LABORATORY DATA:  I have reviewed the data as listed    Latest Ref Rng & Units 07/31/2022    3:07 PM 07/19/2022    3:29 AM 07/18/2022    5:22 AM  CMP  Glucose 70 - 99 mg/dL 161  97  096   BUN 6 - 20 mg/dL 16  12  24    Creatinine 0.44 - 1.00 mg/dL 0.45  4.09  8.11   Sodium 135 - 145 mmol/L 138  140  137   Potassium 3.5 - 5.1 mmol/L 4.5  4.2  4.3   Chloride 98 - 111 mmol/L 103  106  101   CO2 22 - 32 mmol/L 28  28  24    Calcium 8.9 - 10.3 mg/dL 9.2  7.9  8.2     Lab Results  Component Value Date   WBC  6.6 07/19/2022   HGB 9.3 (L) 07/19/2022   HCT 28.1 (L) 07/19/2022   MCV 90.5 07/19/2022   PLT 153 07/19/2022   NEUTROABS 2.8 07/12/2019    ASSESSMENT & PLAN:  No problem-specific Assessment & Plan notes found for this encounter.    No orders of the defined types were placed in this encounter.  The patient has a good understanding of the overall plan. she agrees with it. she will call with any problems that may develop before the next visit here. Total time spent: 30 mins including face to face time and time spent for planning, charting and co-ordination of care   Sherlyn Lick, CMA 08/05/22    I Janan Ridge am acting as a Neurosurgeon for [employer]  ***

## 2022-08-05 NOTE — Telephone Encounter (Signed)
WF [id number] Understanding and Predicting Breast Cancer Events after Treatment (UPBEAT)     Called and spoke to patient on phone about their year 5 follow up for the upbeat study . Cardiovascular questionnaire was completed along with the KCCQ form as  well. Patient is in good health.  I emailed the survey to the patient and it will be completed when the patient has time.  Felecia Jan, [facility] 08/05/2022 3:27 PM

## 2022-08-06 ENCOUNTER — Inpatient Hospital Stay: Payer: 59 | Attending: Hematology and Oncology | Admitting: Hematology and Oncology

## 2022-08-06 VITALS — BP 131/71 | HR 88 | Temp 97.8°F | Resp 18 | Ht 62.0 in | Wt 144.0 lb

## 2022-08-06 DIAGNOSIS — Z9013 Acquired absence of bilateral breasts and nipples: Secondary | ICD-10-CM | POA: Diagnosis not present

## 2022-08-06 DIAGNOSIS — C773 Secondary and unspecified malignant neoplasm of axilla and upper limb lymph nodes: Secondary | ICD-10-CM | POA: Insufficient documentation

## 2022-08-06 DIAGNOSIS — Z79811 Long term (current) use of aromatase inhibitors: Secondary | ICD-10-CM | POA: Diagnosis not present

## 2022-08-06 DIAGNOSIS — Z79899 Other long term (current) drug therapy: Secondary | ICD-10-CM | POA: Diagnosis not present

## 2022-08-06 DIAGNOSIS — C50611 Malignant neoplasm of axillary tail of right female breast: Secondary | ICD-10-CM | POA: Diagnosis not present

## 2022-08-06 DIAGNOSIS — Z170 Estrogen receptor positive status [ER+]: Secondary | ICD-10-CM | POA: Diagnosis not present

## 2022-08-06 MED ORDER — ANASTROZOLE 1 MG PO TABS: 1.0000 mg | ORAL_TABLET | Freq: Every day | ORAL | 3 refills | Status: DC

## 2022-08-06 NOTE — Assessment & Plan Note (Signed)
06/05/2017:Bilateral mastectomies: Right mastectomy: IDC grade 1, 3 foci 1.2 cm, 1.2 cm, 1 cm, IgA DCIS, LVH diffuse present, margins negative, 4/7 lymph nodes positive with extracapsular extension, ER 95%, PR 95%, HER-2 negative, Ki-67 2% to 10%, T1 cN2 aM0 stage II a pathological stage; left mastectomy: Benign   Mammaprint done preoperatively: Low risk Axillary lymph node dissection 06/26/2017: 2/22 lymph nodes positive (making a total of 6 lymph nodes that were positive)   Treatment plan: 1.  Because of  high risk features (6 LN Pos), we recommended adjuvant systemic chemotherapy with dose dense Adriamycin and Cytoxan x4 followed by Taxol weekly x12  07/22/2017-12/09/2017 3. Followed by radiation.  Started 12/30/2017 4. Followed by adjuvant antiestrogen therapy with tamoxifen started 03/25/2018   UPBEAT clinical trial ([id number]): No toxicities related to the trial --------------------------------------------------------------------------- Acute congestive heart failure secondary to Adriamycin: Hospitalization 01/27/2018 to 02/01/2018: EF 25% Follows with Dr. Gala Romney 12/21/2021: EF 55 to 60% Currently on Entresto and Lasix.   Tamoxifen toxicities: She stopped tamoxifen because it was causing endometrial hypertrophy and polyp. 07/17/2022: Hysterectomy and bilateral salpingo-oophorectomy: Postoperative bleed/hematoma required additional surgery and transfusions  Treatment plan: Anastrozole 1 mg daily  Patient has picked up flyfishing  She is also playing virtual reality games for exercise/activity.  She enjoys musical concerts along with her husband. She has lost 50 pounds by doing [place]   Return to clinic in 3 months for telephone visit

## 2022-08-12 ENCOUNTER — Ambulatory Visit (HOSPITAL_COMMUNITY)
Admission: RE | Admit: 2022-08-12 | Discharge: 2022-08-12 | Disposition: A | Discharge status: Home / Self Care | Payer: 59 | Source: Ambulatory Visit | Attending: Cardiology | Admitting: Cardiology

## 2022-08-12 DIAGNOSIS — I5022 Chronic systolic (congestive) heart failure: Secondary | ICD-10-CM | POA: Diagnosis present

## 2022-08-12 LAB — BASIC METABOLIC PANEL
Anion gap: 8 (ref 5–15)
BUN: 15 mg/dL (ref 6–20)
CO2: 28 mmol/L (ref 22–32)
Calcium: 9 mg/dL (ref 8.9–10.3)
Chloride: 103 mmol/L (ref 98–111)
Creatinine, Ser: 0.94 mg/dL (ref 0.44–1.00)
GFR, Estimated: >60 mL/min (ref >60)
Glucose, Bld: 220 mg/dL — ABNORMAL HIGH (ref 70–99)
Potassium: 4.8 mmol/L (ref 3.5–5.1)
Sodium: 139 mmol/L (ref 135–145)

## 2022-08-14 NOTE — Progress Notes (Signed)
Advanced Heart Failure Clinic Note   Referring Physician: Dr Pamelia Hoit PCP: Tisovec, Adelfa Koh, MD PCP-Cardiologist: Arvilla Meres, MD     HPI:  Stephanie Holden is a 47 y.o. female with a history of type 1 DM on insulin pump, thyroid goiter, and breast cancer.           Malignant neoplasm of axillary tail of right breast in female, estrogen receptor positive (HCC)    05/09/2017 Initial Diagnosis      May 2018: Palpable right axillary mass.  The recommended a 6-week follow-up but apparently the mass decreased in size.  Recent increase in the mass was noted, biopsy revealed Grade 1 IDC with DCIS, lymphovascular invasion present, 4 small hypoechoic masses largest 0.9 cm, T1BN1 stage Ib clinical stage         06/05/2017 Surgery      Bilateral mastectomies: Right mastectomy: IDC grade 1, 3 foci 1.2 cm, 1.2 cm, 1 cm, IgA DCIS, LVH diffuse present, margins negative, 4/7 lymph nodes positive with extracapsular extension, ER 95%, PR 95%, HER-2 negative, Ki-67 2% to 10%, T1 cN2 aM0 stage II a pathological stage; left mastectomy: Benign         06/26/2017 Surgery      Axillary lymph node dissection: 2/22 lymph nodes positive (making a total of 6+ lymph nodes)         07/22/2017 -  Chemotherapy      Adjuvant chemotherapy with dose dense Adriamycin and Cytoxan x4 followed by Taxol weekly x12    We saw her 08/2017 for the first time in the cardio-oncology clinic. She had recently completed chemo and echo showed EF 60-65%. Admitted in 12/2017 with PNA but then found to have systolic HF with EF 25-30%. Course c/b large R pleural effusion. Initially treated with thoracentesis on 01/13/18 but then recurred so chest tube was placed. Effusion found to be transudative with negative cytology.    Past HF follow up she had lost 45 pounds with [place] program. Active. No SOB, CP, orthopnea or PND.   07/17/22: Underwent TLH/BSO with cystoscopy. Procedure went well but after patient proceeded to have several  syncopal episodes, fell and hit her head during one event, CT head was (-). Imaging revealed a significant pelvic hematoma. Was taken back to the OR where she underwent lap with hematoma evacuation. Syncope resolved after this. Repeat pelvic CT showed no active bleeding. Discharged home POD #2 and was stable at f/u with OBGYN.   Recently presented to [facility] for post hospital follow up 07/31/22. Overall was feeling good. Denied palpitations, CP, dizziness, edema, or PND/Orthopnea. No SOB. Appetite was good, had been watching what she eats. No fever or chills. Weight at home was 143 pounds. Reported taking all medications.  SBP 120s-110s. Last took PRN Lasix around 4 months ago. 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   Today she returns to HF clinic for pharmacist medication titration. At last visit with APP, Entresto 24/26 mg BID and carvedilol 3.125 mg BID were restarted. She had been off of the medications since her procedure on 06/2022 with instructions not to resume until she was seen by HF for follow up. Overall she is feeling well today. No dizziness or lightheadedness since restarting carvedilol and Entresto. SBP at home has remained 110-120s. No CP or palpitations. No SOB/DOE. She has not needed any PRN Lasix. No LEE, PND or orthopnea. Taking and tolerating all medications.    HF Medications: Carvedilol 3.125 mg BID  Entresto 24/26 mg BID Lasix 40 mg PRN  Has the patient been experiencing any side effects to the medications prescribed?  no  Does the patient have any problems obtaining medications due to transportation or finances?   No, [employer] Nurse, learning disability  Understanding of regimen: excellent Understanding of indications: excellent Potential of compliance: excellent Patient understands to avoid NSAIDs. Patient understands to avoid decongestants.    Pertinent Lab Values: 08/12/22: Serum creatinine 0.94, BUN 15, Potassium 4.8, Sodium 139  Vital Signs: Weight:  147.8 lbs (last clinic weight: 145.6 lbs) Blood pressure: 122/74  Heart rate: 78   Assessment/Plan: Chronic systolic HF due to chemotoxicity - given time course and her age this almost certainly adriamycin-induced cardiotoxicty however Type I DM is a confounder though this has been very well controlled - CT cors 02/2019: Calcium score 0. No CAD - echo 08/2017 EF 60% - echo 01/2018 EF 25-30% - cMRI 02/2018 EF 37%. No LGE. Out of window for ICD. - ECHO 02/2020 EF 55% GLS - 17.1% - cMRI 06/2019 as part of Upbeat study EF 47% - Echo  09/30/19 EF 50-55% GLS -17.5% -> EF improved and stabilized. - Echo 04/28/20 EF 60-65% - Echo today 12/21/21 EF 55-60% GLS -17.3% - NYHA I. Volume status looks good.  - Continue Lasix 40 mg PRN - Increase carvedilol to 6.25 mg BID - Continue Entresto 24/26 mg BID - Restart spironolactone 12.5 mg daily - No SGLT2i due to Type I DM - update echo with next MD visit scheduled for 11/2022.   2. Malignant neoplasm of axillary tail of right breast in female, estrogen receptor positive  - Diagnosed with breast cancer 05/09/17. Underwent bilateral mastectomies on 06/05/17. Underwent axillary lymph node dissection on 06/26/17. Started chemo 07/22/17 with adriamycin and cytoxan x4, then taxol weekly x12. Finished XRT on 02/09/18 - - update echo with next MD visit scheduled for 11/2022.   3. Hyperlipidemia - Continue pravastatin per her Endocrinologist   4. Type I, DM - Follows with Dr. Wylene Simmer - A1c down to 7.2%    Follow up 3 weeks with Pharmacy Clinic. Will increase Entresto if remains stable.    Karle Plumber, PharmD, BCPS, BCCP, CPP Heart Failure Clinic Pharmacist [phone]

## 2022-08-16 ENCOUNTER — Encounter (HOSPITAL_BASED_OUTPATIENT_CLINIC_OR_DEPARTMENT_OTHER): Payer: Self-pay | Admitting: Obstetrics & Gynecology

## 2022-08-16 ENCOUNTER — Ambulatory Visit (HOSPITAL_BASED_OUTPATIENT_CLINIC_OR_DEPARTMENT_OTHER): Payer: 59 | Admitting: Obstetrics & Gynecology

## 2022-08-16 VITALS — BP 137/77 | HR 69 | Ht 62.0 in | Wt 145.2 lb

## 2022-08-16 DIAGNOSIS — D509 Iron deficiency anemia, unspecified: Secondary | ICD-10-CM

## 2022-08-16 DIAGNOSIS — Z9889 Other specified postprocedural states: Secondary | ICD-10-CM

## 2022-08-16 NOTE — Progress Notes (Signed)
GYNECOLOGY  VISIT  CC:   post op recheck  HPI: 47 y.o. [id number] Married White or Caucasian female here for recheck after undergoing TLH/bilateral salpingectomy/cystoscopy on 25/2024.  She then had laparoscopy early the next AM due to post operative bleeding and hemoperitoneum.  She reports bleeding is none.  She has no pain.  Energy was really low the first week or two after surgery but has improved.  They are going to the beach for daughter's birthday.  Two kids, spouses, significant others are coming.    Has seen cardiology.  Cardiac medications have been restarted and are slowly being increased.  She is feeling good.  MEDS:   Current Outpatient Medications on File Prior to Visit  Medication Sig Dispense Refill   anastrozole (ARIMIDEX) 1 MG tablet Take 1 tablet (1 mg total) by mouth daily. 90 tablet 3   carvedilol (COREG) 3.125 MG tablet Take 1 tablet (3.125 mg total) by mouth 2 (two) times daily. 60 tablet 11   carvedilol (COREG) 6.25 MG tablet TAKE 1 TABLET BY MOUTH TWICE DAILY WITH A MEAL 180 tablet 3   Continuous Blood Gluc Sensor (DEXCOM G6 SENSOR) MISC Inject 1 Device into the skin See admin instructions. Place 1 new sensor into the skin every 10 days     CONTOUR TEST test strip USE TO TEST BLOOD SUGARS 6 TIMES DAILY ON PUMP DX  E10.9     furosemide (LASIX) 40 MG tablet TAKE 1 TABLET BY MOUTH EVERY DAY AS NEEDED FOR FLUID/EDEMA 90 tablet 3   Insulin Disposable Pump (OMNIPOD 5 G6 POD, GEN 5,) MISC Inject 1 Device into the skin every 3 (three) days.     insulin lispro (HUMALOG) 100 UNIT/ML injection Inject into the skin. Per Omnipod 5 G6- Continuous- Target BGL is 120     LORazepam (ATIVAN) 0.5 MG tablet Take 1 tablet by mouth every 8 (eight) hours.     Multiple Vitamin (MULTIVITAMIN WITH MINERALS) TABS tablet Take 1 tablet by mouth daily with breakfast.     Potassium Chloride (KLOR-CON PO) Take by mouth. 20 meq. Patient takes 1/2 tablet daily.     pravastatin (PRAVACHOL) 20 MG tablet  Take 20 mg by mouth at bedtime.     Probiotic Product (PROBIOTIC PO) Take 1 capsule by mouth daily.     sacubitril-valsartan (ENTRESTO) 24-26 MG Take 1 tablet by mouth 2 (two) times daily. 60 tablet 11   sacubitril-valsartan (ENTRESTO) 97-103 MG Take 1 tablet by mouth 2 (two) times daily. 180 tablet 3   spironolactone (ALDACTONE) 25 MG tablet TAKE 1/2 TABLET BY MOUTH EVERY DAY 45 tablet 3   venlafaxine XR (EFFEXOR-XR) 37.5 MG 24 hr capsule Take 37.5 mg by mouth daily with breakfast.     Current Facility-Administered Medications on File Prior to Visit  Medication Dose Route Frequency Provider Last Rate Last Admin   acetaminophen (TYLENOL) tablet 650 mg  650 mg Oral Q6H PRN Chrzanowski, Jami B, NP        SH:  Smoking No    PHYSICAL EXAMINATION:    BP 137/77 (BP Location: Left Arm, Patient Position: Sitting, Cuff Size: Large)   Pulse 69   Ht 5\' 2"  (1.575 m) Comment: Reported  Wt 145 lb 3.2 oz (65.9 kg)   BMI 26.56 kg/m     General appearance: alert, cooperative and appears stated age Abdomen: soft, non-tender; bowel sounds normal; no masses,  no organomegaly Incisions:  C/D/I  Pelvic: External genitalia:  no lesions  Urethra:  normal appearing urethra with no masses, tenderness or lesions              Bartholins and Skenes: normal                 Vagina: normal appearing vagina with normal color and discharge, no lesions, cuff appears to be healing well              Cervix: absent              Bimanual Exam:  Uterus:  uterus absent             Assessment/Plan: 1. Post-operative state - recheck 1 month  2. Iron deficiency anemia, unspecified iron deficiency anemia type - will have her return in a couple of weeks for repeat labs to ensure hb and iron levels are normal.  Lab appt made for her today. - CBC; Future - Iron, TIBC and Ferritin Panel; Future

## 2022-08-27 ENCOUNTER — Ambulatory Visit (HOSPITAL_COMMUNITY)
Admission: RE | Admit: 2022-08-27 | Discharge: 2022-08-27 | Disposition: A | Discharge status: Home / Self Care | Payer: 59 | Source: Ambulatory Visit | Attending: Cardiology | Admitting: Cardiology

## 2022-08-27 VITALS — BP 122/74 | HR 78 | Wt 147.8 lb

## 2022-08-27 DIAGNOSIS — Z170 Estrogen receptor positive status [ER+]: Secondary | ICD-10-CM | POA: Insufficient documentation

## 2022-08-27 DIAGNOSIS — Z9641 Presence of insulin pump (external) (internal): Secondary | ICD-10-CM | POA: Insufficient documentation

## 2022-08-27 DIAGNOSIS — E108 Type 1 diabetes mellitus with unspecified complications: Secondary | ICD-10-CM | POA: Insufficient documentation

## 2022-08-27 DIAGNOSIS — I5022 Chronic systolic (congestive) heart failure: Secondary | ICD-10-CM

## 2022-08-27 DIAGNOSIS — E049 Nontoxic goiter, unspecified: Secondary | ICD-10-CM | POA: Insufficient documentation

## 2022-08-27 DIAGNOSIS — Z79899 Other long term (current) drug therapy: Secondary | ICD-10-CM | POA: Diagnosis not present

## 2022-08-27 DIAGNOSIS — E785 Hyperlipidemia, unspecified: Secondary | ICD-10-CM | POA: Insufficient documentation

## 2022-08-27 DIAGNOSIS — C50611 Malignant neoplasm of axillary tail of right female breast: Secondary | ICD-10-CM | POA: Insufficient documentation

## 2022-08-27 MED ORDER — SPIRONOLACTONE 25 MG PO TABS: 12.5000 mg | ORAL_TABLET | Freq: Every day | ORAL | 3 refills | Status: DC

## 2022-08-27 MED ORDER — CARVEDILOL 6.25 MG PO TABS: ORAL_TABLET | ORAL | 3 refills | Status: DC

## 2022-08-27 NOTE — Patient Instructions (Signed)
It was a pleasure seeing you today!  MEDICATIONS: -We are changing your medications today -Start spironolactone 12.5 mg daily -Increase carvedilol to 6.25 mg twice daily -Call if you have questions about your medications.   NEXT APPOINTMENT: Return to clinic in 1 month with Pharmacy Clinic.  In general, to take care of your heart failure: -Limit your fluid intake to 2 Liters (half-gallon) per day.   -Limit your salt intake to ideally 2-3 grams (2000-3000 mg) per day. -Weigh yourself daily and record, and bring that "weight diary" to your next appointment.  (Weight gain of 2-3 pounds in 1 day typically means fluid weight.) -The medications for your heart are to help your heart and help you live longer.   -Please contact us before stopping any of your heart medications.  Call the clinic at [phone] with questions or to reschedule future appointments.

## 2022-09-10 NOTE — Progress Notes (Addendum)
Advanced Heart Failure Clinic Note   Referring Physician: Dr Pamelia Hoit PCP: Tisovec, Adelfa Koh, MD PCP-Cardiologist: Arvilla Meres, MD     HPI:  Stephanie Holden is a 47 y.o. female with a history of type 1 DM on insulin pump, thyroid goiter, and breast cancer.           Malignant neoplasm of axillary tail of right breast in female, estrogen receptor positive (HCC)    05/09/2017 Initial Diagnosis      May 2018: Palpable right axillary mass.  The recommended a 6-week follow-up but apparently the mass decreased in size.  Recent increase in the mass was noted, biopsy revealed Grade 1 IDC with DCIS, lymphovascular invasion present, 4 small hypoechoic masses largest 0.9 cm, T1BN1 stage Ib clinical stage         06/05/2017 Surgery      Bilateral mastectomies: Right mastectomy: IDC grade 1, 3 foci 1.2 cm, 1.2 cm, 1 cm, IgA DCIS, LVH diffuse present, margins negative, 4/7 lymph nodes positive with extracapsular extension, ER 95%, PR 95%, HER-2 negative, Ki-67 2% to 10%, T1 cN2 aM0 stage II a pathological stage; left mastectomy: Benign         06/26/2017 Surgery      Axillary lymph node dissection: 2/22 lymph nodes positive (making a total of 6+ lymph nodes)         07/22/2017 -  Chemotherapy      Adjuvant chemotherapy with dose dense Adriamycin and Cytoxan x4 followed by Taxol weekly x12    We saw her 08/2017 for the first time in the cardio-oncology clinic. She had recently completed chemo and echo showed EF 60-65%. Admitted in 12/2017 with PNA but then found to have systolic HF with EF 25-30%. Course c/b large R pleural effusion. Initially treated with thoracentesis on 01/13/18 but then recurred so chest tube was placed. Effusion found to be transudative with negative cytology.    Past HF follow up she had lost 45 pounds with [place] program. Active. No SOB, CP, orthopnea or PND.   07/17/22: Underwent TLH/BSO with cystoscopy. Procedure went well but after patient proceeded to have several  syncopal episodes, fell and hit her head during one event, CT head was (-). Imaging revealed a significant pelvic hematoma. Was taken back to the OR where she underwent lap with hematoma evacuation. Syncope resolved after this. Repeat pelvic CT showed no active bleeding. Discharged home POD #2 and was stable at f/u with OBGYN.   Recently presented to [facility] for post hospital follow up 07/31/22. Overall was feeling good. Denied palpitations, CP, dizziness, edema, or PND/Orthopnea. No SOB. Appetite was good, had been watching what she eats. No fever or chills. Weight at home was 143 pounds. Reported taking all medications.  SBP 120s-110s. Last took PRN Lasix around 4 months ago. 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   Today she returns to HF clinic for pharmacist medication titration. At last visit with APP, Entresto 24/26 mg BID and carvedilol 3.125 mg BID were restarted. At Pharmacy Clinic visit on 08/27/22, carvedilol was increased to 6.25 mg BID and spironolactone was restarted. Overall she is doing well today. No dizziness, lightheadedness, CP or palpitations. No SOB/DOE. Weight has been stable at home. Did take one dose of Lasix for leg swelling with good response. No LEE on exam today. No PND or orthopnea. Taking all medications as prescribed and tolerating all medications. SBP at home has been 120-130s.    HF Medications: Carvedilol 6.25 mg  BID Entresto 24/26 mg BID Spironolactone 12.5 mg daily Lasix 40 mg PRN  Has the patient been experiencing any side effects to the medications prescribed?  no  Does the patient have any problems obtaining medications due to transportation or finances?   No, [employer] Nurse, learning disability  Understanding of regimen: excellent Understanding of indications: excellent Potential of compliance: excellent Patient understands to avoid NSAIDs. Patient understands to avoid decongestants.    Pertinent Lab Values: 08/12/22: Serum creatinine 0.94,  BUN 15, Potassium 4.8, Sodium 139 BMET today pending  Vital Signs:  Weight: 146 lbs (last clinic weight: 147.8 lbs) Blood pressure: 122/66  Heart rate: 77   Assessment/Plan: Chronic systolic HF due to chemotoxicity - given time course and her age this almost certainly adriamycin-induced cardiotoxicty however Type I DM is a confounder though this has been very well controlled - CT cors 02/2019: Calcium score 0. No CAD - echo 08/2017 EF 60% - echo 01/2018 EF 25-30% - cMRI 02/2018 EF 37%. No LGE. Out of window for ICD. - ECHO 02/2020 EF 55% GLS - 17.1% - cMRI 06/2019 as part of Upbeat study EF 47% - Echo  09/30/19 EF 50-55% GLS -17.5% -> EF improved and stabilized. - Echo 04/28/20 EF 60-65% - Echo today 12/21/21 EF 55-60% GLS -17.3% - NYHA I. Euvolemic on exam.  - BMET today pending - Continue Lasix 40 mg PRN - Continue carvedilol 6.25 mg BID - Increase Entresto to 49/51 mg BID - Continue spironolactone 12.5 mg daily - No SGLT2i due to Type I DM - update echo with next MD visit scheduled for 11/2022.   2. Malignant neoplasm of axillary tail of right breast in female, estrogen receptor positive  - Diagnosed with breast cancer 05/09/17. Underwent bilateral mastectomies on 06/05/17. Underwent axillary lymph node dissection on 06/26/17. Started chemo 07/22/17 with adriamycin and cytoxan x4, then taxol weekly x12. Finished XRT on 02/09/18 - - update echo with next MD visit scheduled for 11/2022.   3. Hyperlipidemia - Continue pravastatin per her Endocrinologist   4. Type I, DM - Follows with Dr. Wylene Simmer - A1c down to 7.2%    Follow up 4 weeks with Pharmacy Clinic/BMET. Will increase Entresto back to target dose if remains stable.    Karle Plumber, PharmD, BCPS, BCCP, CPP Heart Failure Clinic Pharmacist [phone]

## 2022-09-25 ENCOUNTER — Ambulatory Visit (HOSPITAL_COMMUNITY)
Admission: RE | Admit: 2022-09-25 | Discharge: 2022-09-25 | Disposition: A | Discharge status: Home / Self Care | Payer: 59 | Source: Ambulatory Visit | Attending: Cardiology | Admitting: Cardiology

## 2022-09-25 VITALS — BP 122/66 | HR 77 | Wt 146.0 lb

## 2022-09-25 DIAGNOSIS — Z9641 Presence of insulin pump (external) (internal): Secondary | ICD-10-CM | POA: Insufficient documentation

## 2022-09-25 DIAGNOSIS — Z170 Estrogen receptor positive status [ER+]: Secondary | ICD-10-CM | POA: Insufficient documentation

## 2022-09-25 DIAGNOSIS — E049 Nontoxic goiter, unspecified: Secondary | ICD-10-CM | POA: Diagnosis not present

## 2022-09-25 DIAGNOSIS — C50611 Malignant neoplasm of axillary tail of right female breast: Secondary | ICD-10-CM | POA: Diagnosis not present

## 2022-09-25 DIAGNOSIS — E785 Hyperlipidemia, unspecified: Secondary | ICD-10-CM | POA: Insufficient documentation

## 2022-09-25 DIAGNOSIS — E109 Type 1 diabetes mellitus without complications: Secondary | ICD-10-CM | POA: Insufficient documentation

## 2022-09-25 DIAGNOSIS — I5022 Chronic systolic (congestive) heart failure: Secondary | ICD-10-CM | POA: Diagnosis not present

## 2022-09-25 LAB — BASIC METABOLIC PANEL
Anion gap: 9 (ref 5–15)
BUN: 9 mg/dL (ref 6–20)
CO2: 26 mmol/L (ref 22–32)
Calcium: 9.1 mg/dL (ref 8.9–10.3)
Chloride: 104 mmol/L (ref 98–111)
Creatinine, Ser: 0.88 mg/dL (ref 0.44–1.00)
GFR, Estimated: >60 mL/min (ref >60)
Glucose, Bld: 185 mg/dL — ABNORMAL HIGH (ref 70–99)
Potassium: 4.4 mmol/L (ref 3.5–5.1)
Sodium: 139 mmol/L (ref 135–145)

## 2022-09-25 MED ORDER — ENTRESTO 49-51 MG PO TABS: 1.0000 | ORAL_TABLET | Freq: Two times a day (BID) | ORAL | 3 refills | Status: DC

## 2022-09-25 NOTE — Patient Instructions (Signed)
It was a pleasure seeing you today!  MEDICATIONS: -We are changing your medications today -Increase Entresto to 49/51 mg (1 tablet) twice daily. You may take 2 tablets of the 24/26 mg strength twice daily until you receive the new strength.  -Call if you have questions about your medications.  LABS: -We will call you if your labs need attention.  NEXT APPOINTMENT: Return to clinic in 4 weeks with Pharmacy Clinic.  In general, to take care of your heart failure: -Limit your fluid intake to 2 Liters (half-gallon) per day.   -Limit your salt intake to ideally 2-3 grams (2000-3000 mg) per day. -Weigh yourself daily and record, and bring that "weight diary" to your next appointment.  (Weight gain of 2-3 pounds in 1 day typically means fluid weight.) -The medications for your heart are to help your heart and help you live longer.   -Please contact us before stopping any of your heart medications.  Call the clinic at [phone] with questions or to reschedule future appointments.  

## 2022-10-02 NOTE — Progress Notes (Signed)
Advanced Heart Failure Clinic Note   Referring Physician: Dr Pamelia Hoit PCP: Tisovec, Adelfa Koh, MD PCP-Cardiologist: Arvilla Meres, MD     HPI:  Stephanie Holden is a 47 y.o. female with a history of type 1 DM on insulin pump, thyroid goiter, and breast cancer.           Malignant neoplasm of axillary tail of right breast in female, estrogen receptor positive (HCC)    05/09/2017 Initial Diagnosis      May 2018: Palpable right axillary mass.  The recommended a 6-week follow-up but apparently the mass decreased in size.  Recent increase in the mass was noted, biopsy revealed Grade 1 IDC with DCIS, lymphovascular invasion present, 4 small hypoechoic masses largest 0.9 cm, T1BN1 stage Ib clinical stage         06/05/2017 Surgery      Bilateral mastectomies: Right mastectomy: IDC grade 1, 3 foci 1.2 cm, 1.2 cm, 1 cm, IgA DCIS, LVH diffuse present, margins negative, 4/7 lymph nodes positive with extracapsular extension, ER 95%, PR 95%, HER-2 negative, Ki-67 2% to 10%, T1 cN2 aM0 stage II a pathological stage; left mastectomy: Benign         06/26/2017 Surgery      Axillary lymph node dissection: 2/22 lymph nodes positive (making a total of 6+ lymph nodes)         07/22/2017 -  Chemotherapy      Adjuvant chemotherapy with dose dense Adriamycin and Cytoxan x4 followed by Taxol weekly x12    We saw her 08/2017 for the first time in the cardio-oncology clinic. She had recently completed chemo and echo showed EF 60-65%. Admitted in 12/2017 with PNA but then found to have systolic HF with EF 25-30%. Course c/b large R pleural effusion. Initially treated with thoracentesis on 01/13/18 but then recurred so chest tube was placed. Effusion found to be transudative with negative cytology.    Past HF follow up she had lost 45 pounds with [place] program. Active. No SOB, CP, orthopnea or PND.   07/17/22: Underwent TLH/BSO with cystoscopy. Procedure went well but after patient proceeded to have several  syncopal episodes, fell and hit her head during one event, CT head was (-). Imaging revealed a significant pelvic hematoma. Was taken back to the OR where she underwent lap with hematoma evacuation. Syncope resolved after this. Repeat pelvic CT showed no active bleeding. Discharged home POD #2 and was stable at f/u with OBGYN.   Recently presented to [facility] for post hospital follow up 07/31/22. Overall was feeling good. Denied palpitations, CP, dizziness, edema, or PND/Orthopnea. No SOB. Appetite was good, had been watching what she eats. No fever or chills. Weight at home was 143 pounds. Reported taking all medications.  SBP 120s-110s. Last took PRN Lasix around 4 months ago. 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   Today she returns to HF clinic for pharmacist medication titration. At recent visits to clinic, carvedilol, Entresto and spironolactone were restarted. Overall she is feeling well today. Noted some fatigue for the first few days after increasing Entresto, but this has now resolved. No dizziness, lightheadedness, CP or palpitations. No SOB/DOE. Weight at home has been stable at 146 lbs. Has used a few doses of PRN Lasix while out of town on vacation and eating foods with a higher salt content than her usual. No LEE, PND or orthopnea. Taking all medications as prescribed and tolerating all medications. SBP at home has been stable at 120-130  mmHg.   HF Medications: Carvedilol 6.25 mg BID Entresto 49/51 mg BID Spironolactone 12.5 mg daily Lasix 40 mg PRN  Has the patient been experiencing any side effects to the medications prescribed?  no  Does the patient have any problems obtaining medications due to transportation or finances?   No, [employer] Nurse, learning disability  Understanding of regimen: excellent Understanding of indications: excellent Potential of compliance: excellent Patient understands to avoid NSAIDs. Patient understands to avoid decongestants.    Pertinent  Lab Values: 09/25/22: Serum creatinine 0.88, BUN 9, Potassium 4.4, Sodium 139 BMET today pending  Vital Signs:  Weight: 146.4 lbs (last clinic weight: 146 lbs) Blood pressure: 122/66  Heart rate: 83   Assessment/Plan: Chronic systolic HF due to chemotoxicity - given time course and her age this almost certainly adriamycin-induced cardiotoxicty however Type I DM is a confounder though this has been very well controlled - CT cors 02/2019: Calcium score 0. No CAD - echo 08/2017 EF 60% - echo 01/2018 EF 25-30% - cMRI 02/2018 EF 37%. No LGE. Out of window for ICD. - ECHO 02/2020 EF 55% GLS - 17.1% - cMRI 06/2019 as part of Upbeat study EF 47% - Echo  09/30/19 EF 50-55% GLS -17.5% -> EF improved and stabilized. - Echo 04/28/20 EF 60-65% - Echo 12/21/21 EF 55-60% GLS -17.3% - NYHA I. Euvolemic on exam.  - BMET today pending  - Continue Lasix 40 mg PRN - Continue carvedilol 6.25 mg BID - Increase Entresto to 97/103 mg BID. Repeat BMET at follow up.  - Continue spironolactone 12.5 mg daily - No SGLT2i due to Type I DM - update echo with next MD visit scheduled for 11/2022.   2. Malignant neoplasm of axillary tail of right breast in female, estrogen receptor positive  - Diagnosed with breast cancer 05/09/17. Underwent bilateral mastectomies on 06/05/17. Underwent axillary lymph node dissection on 06/26/17. Started chemo 07/22/17 with adriamycin and cytoxan x4, then taxol weekly x12. Finished XRT on 02/09/18 - - update echo with next MD visit scheduled for 11/2022.   3. Hyperlipidemia - Continue pravastatin per her Endocrinologist   4. Type I, DM - Follows with Dr. Wylene Simmer - A1c down to 7.2%    Follow up 1 month with Dr. Gala Romney.    Karle Plumber, PharmD, BCPS, BCCP, CPP Heart Failure Clinic Pharmacist [phone]

## 2022-10-08 ENCOUNTER — Other Ambulatory Visit (HOSPITAL_BASED_OUTPATIENT_CLINIC_OR_DEPARTMENT_OTHER): Payer: 59

## 2022-10-08 DIAGNOSIS — D509 Iron deficiency anemia, unspecified: Secondary | ICD-10-CM

## 2022-10-09 LAB — IRON,TIBC AND FERRITIN PANEL
Ferritin: 41 ng/mL (ref 15–150)
Iron Saturation: 45 % (ref 15–55)
Iron: 136 ug/dL (ref 27–159)
Total Iron Binding Capacity: 301 ug/dL (ref 250–450)
UIBC: 165 ug/dL (ref 131–425)

## 2022-10-09 LAB — CBC
Hematocrit: 45.1 % (ref 34.0–46.6)
Hemoglobin: 14.9 g/dL (ref 11.1–15.9)
MCH: 29.8 pg (ref 26.6–33.0)
MCHC: 33 g/dL (ref 31.5–35.7)
MCV: 90 fL (ref 79–97)
Platelets: 262 10*3/uL (ref 150–450)
RBC: 5 x10E6/uL (ref 3.77–5.28)
RDW: 12.7 % (ref 11.7–15.4)
WBC: 5 10*3/uL (ref 3.4–10.8)

## 2022-10-24 ENCOUNTER — Ambulatory Visit (HOSPITAL_COMMUNITY)
Admission: RE | Admit: 2022-10-24 | Discharge: 2022-10-24 | Disposition: A | Discharge status: Home / Self Care | Payer: 59 | Source: Ambulatory Visit | Attending: Cardiology | Admitting: Cardiology

## 2022-10-24 VITALS — BP 122/66 | HR 83 | Wt 146.4 lb

## 2022-10-24 DIAGNOSIS — I5022 Chronic systolic (congestive) heart failure: Secondary | ICD-10-CM

## 2022-10-24 DIAGNOSIS — E049 Nontoxic goiter, unspecified: Secondary | ICD-10-CM | POA: Insufficient documentation

## 2022-10-24 DIAGNOSIS — C50611 Malignant neoplasm of axillary tail of right female breast: Secondary | ICD-10-CM | POA: Insufficient documentation

## 2022-10-24 DIAGNOSIS — E785 Hyperlipidemia, unspecified: Secondary | ICD-10-CM | POA: Insufficient documentation

## 2022-10-24 DIAGNOSIS — Z9641 Presence of insulin pump (external) (internal): Secondary | ICD-10-CM | POA: Insufficient documentation

## 2022-10-24 DIAGNOSIS — E109 Type 1 diabetes mellitus without complications: Secondary | ICD-10-CM | POA: Diagnosis present

## 2022-10-24 DIAGNOSIS — T451X5A Adverse effect of antineoplastic and immunosuppressive drugs, initial encounter: Secondary | ICD-10-CM | POA: Diagnosis not present

## 2022-10-24 DIAGNOSIS — Z170 Estrogen receptor positive status [ER+]: Secondary | ICD-10-CM | POA: Insufficient documentation

## 2022-10-24 DIAGNOSIS — C773 Secondary and unspecified malignant neoplasm of axilla and upper limb lymph nodes: Secondary | ICD-10-CM | POA: Insufficient documentation

## 2022-10-24 LAB — BASIC METABOLIC PANEL
Anion gap: 9 (ref 5–15)
BUN: 23 mg/dL — ABNORMAL HIGH (ref 6–20)
CO2: 30 mmol/L (ref 22–32)
Calcium: 10 mg/dL (ref 8.9–10.3)
Chloride: 98 mmol/L (ref 98–111)
Creatinine, Ser: 0.87 mg/dL (ref 0.44–1.00)
GFR, Estimated: >60 mL/min (ref >60)
Glucose, Bld: 138 mg/dL — ABNORMAL HIGH (ref 70–99)
Potassium: 4.3 mmol/L (ref 3.5–5.1)
Sodium: 137 mmol/L (ref 135–145)

## 2022-10-24 MED ORDER — ENTRESTO 97-103 MG PO TABS: 1.0000 | ORAL_TABLET | Freq: Two times a day (BID) | ORAL | 3 refills | Status: DC

## 2022-10-24 NOTE — Patient Instructions (Signed)
It was a pleasure seeing you today!  MEDICATIONS: -We are changing your medications today -Increase Entresto to 97/103 mg (1 tablet) twice daily.  You may take 2 tablets of the 49/51 mg strength twice daily until you receive the new strength.  -Call if you have questions about your medications.  LABS: -We will call you if your labs need attention.  NEXT APPOINTMENT: Return to clinic in 1 month with Dr. Gala Romney.  In general, to take care of your heart failure: -Limit your fluid intake to 2 Liters (half-gallon) per day.   -Limit your salt intake to ideally 2-3 grams (2000-3000 mg) per day. -Weigh yourself daily and record, and bring that "weight diary" to your next appointment.  (Weight gain of 2-3 pounds in 1 day typically means fluid weight.) -The medications for your heart are to help your heart and help you live longer.   -Please contact us before stopping any of your heart medications.  Call the clinic at [phone] with questions or to reschedule future appointments.

## 2022-11-05 NOTE — Progress Notes (Signed)
HEMATOLOGY-ONCOLOGY TELEPHONE VISIT PROGRESS NOTE  I connected with our patient on 11/07/22 at 11:15 AM EDT by telephone and verified that I am speaking with the correct person using two identifiers.  I discussed the limitations, risks, security and privacy concerns of performing an evaluation and management service by telephone and the availability of in person appointments.  I also discussed with the patient that there may be a patient responsible charge related to this service. The patient expressed understanding and agreed to proceed.   History of Present Illness: : Stephanie Holden is a 47 year old with above-mentioned history of bilateral mastectomies for right breast cancer. She presents to the clinic for a telephone follow-up.  She is tolerating anastrozole extremely well without any problems or concerns.  She is stretching and that is helping her joints.  She does not have any major hot flashes.  Oncology History  Malignant neoplasm of axillary tail of right breast in female, estrogen receptor positive (HCC)  05/09/2017 Initial Diagnosis   May 2018: Palpable right axillary mass.  The recommended a 6-week follow-up but apparently the mass decreased in size.  Recent increase in the mass was noted, biopsy revealed Grade 1 IDC with DCIS, lymphovascular invasion present, 4 small hypoechoic masses largest 0.9 cm, T1BN1 stage Ib clinical stage   06/05/2017 Surgery   Bilateral mastectomies: Right mastectomy: IDC grade 1, 3 foci 1.2 cm, 1.2 cm, 1 cm, IgA DCIS, LVH diffuse present, margins negative, 4/7 lymph nodes positive with extracapsular extension, ER 95%, PR 95%, HER-2 negative, Ki-67 2% to 10%, T1 cN2 aM0 stage II a pathological stage; left mastectomy: Benign   06/26/2017 Surgery   Axillary lymph node dissection: 2/22 lymph nodes positive (making a total of 6+ lymph nodes)   07/22/2017 - 12/09/2017 Chemotherapy   Adjuvant chemotherapy with dose dense Adriamycin and Cytoxan x4 followed by Taxol  weekly x12    12/23/2017 Cancer Staging   Staging form: Breast, AJCC 8th Edition - Pathologic: Stage IB (pT1c, pN2a, cM0, G1, ER+, PR+, HER2-) - Signed by Lonie Peak, MD on 12/23/2017   12/30/2017 - 02/09/2018 Radiation Therapy   Adjuvant radiation   01/12/2018 Imaging   No PE, patchy consolidation in both lungs groundglass opacities, differential infection versus radiation pneumonitis, right pleural effusion moderate to large, left pleural effusion moderate   01/27/2018 - 02/01/2018 Hospital Admission   Hospital admission for congestive heart failure with pleural effusion, EF 25% suspected to be related to Adriamycin   03/25/2018 -  Anti-estrogen oral therapy   Tamoxifen daily   Breast cancer metastasized to axillary lymph node, right (HCC)  06/05/2017 Initial Diagnosis   Breast cancer metastasized to axillary lymph node, right (HCC)   07/22/2017 -  Chemotherapy   Adjuvant chemotherapy with dose dense Adriamycin and Cytoxan x4 followed by Taxol weekly x12      REVIEW OF SYSTEMS:   Constitutional: Denies fevers, chills or abnormal weight loss All other systems were reviewed with the patient and are negative. Observations/Objective:     Assessment Plan:  Malignant neoplasm of axillary tail of right breast in female, estrogen receptor positive (HCC) 06/05/2017:Bilateral mastectomies: Right mastectomy: IDC grade 1, 3 foci 1.2 cm, 1.2 cm, 1 cm, IgA DCIS, LVH diffuse present, margins negative, 4/7 lymph nodes positive with extracapsular extension, ER 95%, PR 95%, HER-2 negative, Ki-67 2% to 10%, T1 cN2 aM0 stage II a pathological stage; left mastectomy: Benign   Mammaprint done preoperatively: Low risk Axillary lymph node dissection 06/26/2017: 2/22 lymph nodes positive (making a  total of 6 lymph nodes that were positive)   Treatment plan: 1.  Because of  high risk features (6 LN Pos), we recommended adjuvant systemic chemotherapy with dose dense Adriamycin and Cytoxan x4 followed by  Taxol weekly x12  07/22/2017-12/09/2017 3. Followed by radiation.  Started 12/30/2017 4. Followed by adjuvant antiestrogen therapy with tamoxifen started 03/25/2018   UPBEAT clinical trial ([id number]): No toxicities related to the trial --------------------------------------------------------------------------- Acute congestive heart failure secondary to Adriamycin: Hospitalization 01/27/2018 to 02/01/2018: EF 25% Follows with Dr. Gala Romney 12/21/2021: EF 55 to 60% Currently on Entresto and Lasix.   Tamoxifen toxicities: She stopped tamoxifen because it was causing endometrial hypertrophy and polyp. 07/17/2022: Hysterectomy and bilateral salpingo-oophorectomy: Postoperative bleed/hematoma required additional surgery and transfusions   Current treatment: Anastrozole 1 mg daily started 08/06/2022  Anastrozole toxicities: Tolerating it very well Mild hot flashes  Patient has picked up flyfishing  She does enjoy Dirt track racing (they have a car in these races)  She is also playing virtual reality games for exercise/activity.  She enjoys musical concerts along with her husband. She has lost 50 pounds by doing [place]   We will obtain a CT chest abdomen pelvis in April 2025. Bone density will be done in the next month.  Return to clinic in as previous scheduled in February 2025.  I discussed the assessment and treatment plan with the patient. The patient was provided an opportunity to ask questions and all were answered. The patient agreed with the plan and demonstrated an understanding of the instructions. The patient was advised to call back or seek an in-person evaluation if the symptoms worsen or if the condition fails to improve as anticipated.   I provided 12 minutes of non-face-to-face time during this encounter.  This includes time for charting and coordination of care   Tamsen Meek, MD  I Janan Ridge am acting as a scribe for Dr.Vinay Gudena  I have reviewed the above  documentation for accuracy and completeness, and I agree with the above.

## 2022-11-07 ENCOUNTER — Inpatient Hospital Stay: Payer: 59 | Attending: Hematology and Oncology | Admitting: Hematology and Oncology

## 2022-11-07 DIAGNOSIS — Z170 Estrogen receptor positive status [ER+]: Secondary | ICD-10-CM | POA: Diagnosis not present

## 2022-11-07 DIAGNOSIS — C50611 Malignant neoplasm of axillary tail of right female breast: Secondary | ICD-10-CM

## 2022-11-07 DIAGNOSIS — Z780 Asymptomatic menopausal state: Secondary | ICD-10-CM | POA: Diagnosis not present

## 2022-11-07 NOTE — Assessment & Plan Note (Signed)
06/05/2017:Bilateral mastectomies: Right mastectomy: IDC grade 1, 3 foci 1.2 cm, 1.2 cm, 1 cm, IgA DCIS, LVH diffuse present, margins negative, 4/7 lymph nodes positive with extracapsular extension, ER 95%, PR 95%, HER-2 negative, Ki-67 2% to 10%, T1 cN2 aM0 stage II a pathological stage; left mastectomy: Benign   Mammaprint done preoperatively: Low risk Axillary lymph node dissection 06/26/2017: 2/22 lymph nodes positive (making a total of 6 lymph nodes that were positive)   Treatment plan: 1.  Because of  high risk features (6 LN Pos), we recommended adjuvant systemic chemotherapy with dose dense Adriamycin and Cytoxan x4 followed by Taxol weekly x12  07/22/2017-12/09/2017 3. Followed by radiation.  Started 12/30/2017 4. Followed by adjuvant antiestrogen therapy with tamoxifen started 03/25/2018   UPBEAT clinical trial ([id number]): No toxicities related to the trial --------------------------------------------------------------------------- Acute congestive heart failure secondary to Adriamycin: Hospitalization 01/27/2018 to 02/01/2018: EF 25% Follows with Dr. Gala Romney 12/21/2021: EF 55 to 60% Currently on Entresto and Lasix.   Tamoxifen toxicities: She stopped tamoxifen because it was causing endometrial hypertrophy and polyp. 07/17/2022: Hysterectomy and bilateral salpingo-oophorectomy: Postoperative bleed/hematoma required additional surgery and transfusions   Current treatment: Anastrozole 1 mg daily started 08/06/2022  Anastrozole toxicities:  Patient has picked up flyfishing  She is also playing virtual reality games for exercise/activity.  She enjoys musical concerts along with her husband. She has lost 50 pounds by doing [place]   Return to clinic in 1 year for follow-up

## 2022-11-28 ENCOUNTER — Telehealth (HOSPITAL_COMMUNITY): Payer: Self-pay | Admitting: *Deleted

## 2022-12-01 NOTE — Progress Notes (Signed)
Advanced Heart Failure Clinic Note   Referring Physician: Dr Pamelia Hoit PCP: Tisovec, Adelfa Koh, MD PCP-Cardiologist: Arvilla Meres, MD   HPI: Stephanie Holden is a 47 y.o. female with a history of type 1 DM on insulin pump, thyroid goiter, and breast cancer.      Malignant neoplasm of axillary tail of right breast in female, estrogen receptor positive (HCC)    05/09/2017 Initial Diagnosis      May 2018: Palpable right axillary mass.  The recommended a 6-week follow-up but apparently the mass decreased in size.  Recent increase in the mass was noted, biopsy revealed Grade 1 IDC with DCIS, lymphovascular invasion present, 4 small hypoechoic masses largest 0.9 cm, T1BN1 stage Ib clinical stage         06/05/2017 Surgery      Bilateral mastectomies: Right mastectomy: IDC grade 1, 3 foci 1.2 cm, 1.2 cm, 1 cm, IgA DCIS, LVH diffuse present, margins negative, 4/7 lymph nodes positive with extracapsular extension, ER 95%, PR 95%, HER-2 negative, Ki-67 2% to 10%, T1 cN2 aM0 stage II a pathological stage; left mastectomy: Benign         06/26/2017 Surgery      Axillary lymph node dissection: 2/22 lymph nodes positive (making a total of 6+ lymph nodes)         07/22/2017 -  Chemotherapy      Adjuvant chemotherapy with dose dense Adriamycin and Cytoxan x4 followed by Taxol weekly x12   We saw her 6/19 for the first time in the cardio-oncology clinic. She had recently completed chemo and echo showed EF 60-65%. Admitted in 10/19 with PNA but then found to have systolic HF with EF 25-30%. Course c/b large R pleural effusion. Initially treated with thoracentesis on 01/13/18 but then recurred so chest tube was placed. Effusion found to be transudative with negative cytology.   07/17/22: Underwent TLH/BSO with cystoscopy. Procedure went well but after patient proceeded to have several syncopal episodes, fell and hit her head during one event, CT head was (-). Imaging revealed a significant pelvic hematoma. Was  taken back to the OR where she underwent lap with hematoma evacuation.  Has followed with [facility] PharmD to retitrate meds after they were stopped post-op  Here for f/u. Doing very well. No CP or SOB. Remains active. Compliant with meds.   Echo today 12/02/22: EF 50-55% GLS -17.6% Personally reviewed  Cardiac studies: Echo 12/21/21: EF 55-60% GLS -17.3% Echo 04/28/20 EF 60-65% RV normal Personally reviewed Echo 7/21 EF 50-55% GLS -17.1% CMRI 07/12/19 as part of Upbeat study, EF 47%   CT cors 12/20: Calcium score 0. No CAD  cMRI 12/19 1.  Normal LV size with moderate diffuse hypokinesis, EF 37%. 2.  Normal RV size with mildly decreased systolic function, EF 43%. 3. No myocardial LGE, so no definitive evidence for prior myocardial infarction, infiltrative disease, or myocarditis.  ECHO 09/10/2018 EF 40-45% GLS 17.6.  Echo 01/28/18 EF 25-30% Grade II DD. Mild RV dysfunction Echo 6/19 EF 60-65% LS 11.90 GLS 18.7%  Review of systems complete and found to be negative unless listed in HPI.   Past Medical History:  Diagnosis Date   Cancer [facility])    Breast cancer - right   CHF (congestive heart failure) (HCC) 2020   chemo induced cardiomyopathy, 12/21/21 echocardiogram EF 55-60%   Constipation    resolved 07/09/22 per pt   Depression    Follows w/ Dr. Pamelia Hoit, oncologist.   Genetic testing 05/27/2017   Breast/GYN panel (  23 genes) @ [facility] - No pathogenic mutations detected   GERD (gastroesophageal reflux disease)    resolved per patient as of 07/09/2022   History of chemotherapy 2019   Follows w/ Dr. Pamelia Hoit @ [facility].   History of radiation therapy 12/30/17- 02/10/18   Right chest wall/ 50 Gy in 25 fractions, Right SCV, PAB nodes/ 50 Gy in 25 fractions, Right Chest wall boost 10 gy in 5 fractions.    Insulin dependent type 1 diabetes mellitus (HCC)    Follows w/ Dr. Darral Dash, endocronologist.   Insulin pump in place    Pleural effusion 01/13/2018   Pneumonia 01/12/2018    PONV (postoperative nausea and vomiting)    one time per pt   Stenosing tenosynovitis of thumb 06/2014   right   Thyroid goiter    Wears contact lenses    Wears glasses     Current Outpatient Medications  Medication Sig Dispense Refill   anastrozole (ARIMIDEX) 1 MG tablet Take 1 tablet (1 mg total) by mouth daily. 90 tablet 3   carvedilol (COREG) 6.25 MG tablet TAKE 1 TABLET BY MOUTH TWICE DAILY WITH A MEAL 180 tablet 3   Continuous Blood Gluc Sensor (DEXCOM G6 SENSOR) MISC Inject 1 Device into the skin See admin instructions. Place 1 new sensor into the skin every 10 days     CONTOUR TEST test strip USE TO TEST BLOOD SUGARS 6 TIMES DAILY ON PUMP DX  E10.9     furosemide (LASIX) 40 MG tablet TAKE 1 TABLET BY MOUTH EVERY DAY AS NEEDED FOR FLUID/EDEMA 90 tablet 3   Insulin Disposable Pump (OMNIPOD 5 G6 POD, GEN 5,) MISC Inject 1 Device into the skin every 3 (three) days.     insulin lispro (HUMALOG) 100 UNIT/ML injection Inject into the skin. Per Omnipod 5 G6- Continuous- Target BGL is 120     LORazepam (ATIVAN) 0.5 MG tablet Take 1 tablet by mouth every 8 (eight) hours.     Multiple Vitamin (MULTIVITAMIN WITH MINERALS) TABS tablet Take 1 tablet by mouth daily with breakfast.     Potassium Chloride (KLOR-CON PO) Take by mouth. 20 meq. Patient takes 1/2 tablet with Lasix as needed.     pravastatin (PRAVACHOL) 20 MG tablet Take 20 mg by mouth at bedtime.     Probiotic Product (PROBIOTIC PO) Take 1 capsule by mouth daily.     sacubitril-valsartan (ENTRESTO) 97-103 MG Take 1 tablet by mouth 2 (two) times daily. 180 tablet 3   spironolactone (ALDACTONE) 25 MG tablet Take 0.5 tablets (12.5 mg total) by mouth daily. 45 tablet 3   venlafaxine XR (EFFEXOR-XR) 37.5 MG 24 hr capsule Take 37.5 mg by mouth daily with breakfast.     No current facility-administered medications for this encounter.    Allergies  Allergen Reactions   Dilaudid [Hydromorphone Hcl] Itching and Other (See Comments)     "UNCONTROLLABLE CRYING," also      Social History   Socioeconomic History   Marital status: Married    Spouse name: Not on file   Number of children: Not on file   Years of education: Not on file   Highest education level: Not on file  Occupational History   Not on file  Tobacco Use   Smoking status: Former    Current packs/day: 0.00    Average packs/day: 0.1 packs/day for 2.0 years (0.3 ttl pk-yrs)    Types: Cigarettes    Start date: 03/25/1999    Quit date: 03/24/2001  Years since quitting: 21.7   Smokeless tobacco: Never  Vaping Use   Vaping status: Never Used  Substance and Sexual Activity   Alcohol use: Yes    Comment: about 1 per month   Drug use: Yes    Types: Marijuana    Comment: last used 6 years ago as of 07/09/22 per pt   Sexual activity: Yes    Partners: Male    Birth control/protection: Surgical    Comment: tubal ligation & 1 tube removed, menarche 47yo, sexual debut 47yo  Other Topics Concern   Not on file  Social History Narrative   Not on file   Social Determinants of Health   Financial Resource Strain: Not on file  Food Insecurity: Not on file  Transportation Needs: No Transportation Needs (12/22/2017)   PRAPARE - Administrator, Civil Service (Medical): No    Lack of Transportation (Non-Medical): No  Physical Activity: Not on file  Stress: Not on file  Social Connections: Not on file  Intimate Partner Violence: Not At Risk (12/22/2017)   Humiliation, Afraid, Rape, and Kick questionnaire    Fear of Current or Ex-Partner: No    Emotionally Abused: No    Physically Abused: No    Sexually Abused: No    Family History  Problem Relation Age of Onset   Hypertension Mother    Hypertension Father    Breast cancer Other 58       paternal grandfather's niece    There were no vitals filed for this visit.   Wt Readings from Last 3 Encounters:  10/24/22 66.4 kg (146 lb 6.4 oz)  09/25/22 66.2 kg (146 lb)  08/27/22 67 kg (147 lb  12.8 oz)    PHYSICAL EXAM: General:  Well appearing. No resp difficulty HEENT: normal Neck: supple. no JVD. Carotids 2+ bilat; no bruits. No lymphadenopathy or thryomegaly appreciated. Cor: PMI nondisplaced. Regular rate & rhythm. No rubs, gallops or murmurs. Lungs: clear Abdomen: soft, nontender, nondistended. No hepatosplenomegaly. No bruits or masses. Good bowel sounds. Extremities: no cyanosis, clubbing, rash, edema Neuro: alert & orientedx3, cranial nerves grossly intact. moves all 4 extremities w/o difficulty. Affect pleasant  ASSESSMENT & PLAN:  1. 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 No CAD - echo 6/19 EF 60% - echo 11/19 EF 25-30% - cMRI 12/19 EF 37%. No LGE. Out of window for ICD. - ECHO 12/21 EF 55% GLS - 17.1% - cMRI 4/21 as part of Upbeat study EF 47% - Echo  09/30/19 EF 50-55% GLS -17.5% -> EF improved and stabilized. - Echo 04/28/20 EF 60-65% - Echo 12/21/21 EF 55-60% GLS -17.3% - Echo today 12/02/22: EF 50-55% GLS -17.6% Personally reviewed - NYHA I. Volume status looks good.  - Continue Entresto 97/103 - Continue carvedilol  6.25 bid  - Continue spiro 12.5 daily - No Farxiga due to Type I DM - Doing well. EF stable. Repeat echo in 1 year   2. Malignant neoplasm of axillary tail of right breast in female, estrogen receptor positive  - Diagnosed with breast cancer 05/09/17. Underwent bilateral mastectomies on 06/05/17. Underwent axillary lymph node dissection on 06/26/17. Completed chemo 07/22/17 with adriamycin and cytoxan x4, then taxol weekly x12. Finished XRT on 02/09/18  3. Hyperlipidemia - Continue prava per her Endocrinologist  4. Type I,  DM - Follows with Dr. Wylene Simmer - A1c down to 7.2%  Arvilla Meres, MD  9:01 PM  Advanced Heart Failure Clinic [facility]  Health [address] [facility] [place] [address] [phone] (office) [phone] (fax)

## 2022-12-02 ENCOUNTER — Ambulatory Visit (HOSPITAL_BASED_OUTPATIENT_CLINIC_OR_DEPARTMENT_OTHER)
Admission: RE | Admit: 2022-12-02 | Discharge: 2022-12-02 | Disposition: A | Discharge status: Home / Self Care | Payer: 59 | Source: Ambulatory Visit | Attending: Internal Medicine | Admitting: Internal Medicine

## 2022-12-02 ENCOUNTER — Encounter (HOSPITAL_COMMUNITY): Payer: Self-pay | Admitting: Internal Medicine

## 2022-12-02 ENCOUNTER — Ambulatory Visit (HOSPITAL_COMMUNITY)
Admission: RE | Admit: 2022-12-02 | Discharge: 2022-12-02 | Disposition: A | Discharge status: Home / Self Care | Payer: 59 | Source: Ambulatory Visit | Attending: Internal Medicine | Admitting: Internal Medicine

## 2022-12-02 VITALS — BP 140/80 | HR 59 | Wt 151.4 lb

## 2022-12-02 DIAGNOSIS — I5022 Chronic systolic (congestive) heart failure: Secondary | ICD-10-CM

## 2022-12-02 DIAGNOSIS — E785 Hyperlipidemia, unspecified: Secondary | ICD-10-CM | POA: Insufficient documentation

## 2022-12-02 DIAGNOSIS — Z170 Estrogen receptor positive status [ER+]: Secondary | ICD-10-CM | POA: Diagnosis not present

## 2022-12-02 DIAGNOSIS — Z9641 Presence of insulin pump (external) (internal): Secondary | ICD-10-CM | POA: Insufficient documentation

## 2022-12-02 DIAGNOSIS — C50611 Malignant neoplasm of axillary tail of right female breast: Secondary | ICD-10-CM

## 2022-12-02 DIAGNOSIS — T451X5A Adverse effect of antineoplastic and immunosuppressive drugs, initial encounter: Secondary | ICD-10-CM

## 2022-12-02 DIAGNOSIS — I427 Cardiomyopathy due to drug and external agent: Secondary | ICD-10-CM

## 2022-12-02 DIAGNOSIS — E109 Type 1 diabetes mellitus without complications: Secondary | ICD-10-CM | POA: Insufficient documentation

## 2022-12-02 DIAGNOSIS — Z794 Long term (current) use of insulin: Secondary | ICD-10-CM | POA: Diagnosis not present

## 2022-12-02 DIAGNOSIS — Z9013 Acquired absence of bilateral breasts and nipples: Secondary | ICD-10-CM | POA: Diagnosis not present

## 2022-12-02 DIAGNOSIS — Z79899 Other long term (current) drug therapy: Secondary | ICD-10-CM | POA: Insufficient documentation

## 2022-12-02 LAB — ECHOCARDIOGRAM COMPLETE
Area-P 1/2: 4.08 cm2
Calc EF: 53.7 %
MV M vel: 4.04 m/s
MV Peak grad: 65.3 mmHg
S' Lateral: 3.4 cm
Single Plane A2C EF: 51.4 %
Single Plane A4C EF: 45.2 %

## 2022-12-02 NOTE — Addendum Note (Signed)
Encounter addended by: Noralee Space, RN on: 12/02/2022 1:46 PM  Actions taken: Care Plan modified, Order list changed, Diagnosis association updated, Clinical Note Signed

## 2022-12-02 NOTE — Progress Notes (Signed)
  Echocardiogram 2D Echocardiogram has been performed.  Stephanie Holden 12/02/2022, 12:15 PM

## 2022-12-02 NOTE — Patient Instructions (Signed)
Your physician recommends that you schedule a follow-up appointment in: 1 year with an echocardiogram (Sept 2025), **PLEASE CALL OUR OFFICE IN JULY TO SCHEDULE THIS APPOINTMENT  If you have any questions or concerns before your next appointment please send [place] a message through [place] or call our office at [phone].    TO LEAVE A MESSAGE FOR THE NURSE SELECT OPTION 2, PLEASE LEAVE A MESSAGE INCLUDING: YOUR NAME DATE OF BIRTH CALL BACK NUMBER REASON FOR CALL**this is important as we prioritize the call backs  YOU WILL RECEIVE A CALL BACK THE SAME DAY AS LONG AS YOU CALL BEFORE 4:00 PM

## 2023-01-08 ENCOUNTER — Ambulatory Visit (HOSPITAL_BASED_OUTPATIENT_CLINIC_OR_DEPARTMENT_OTHER)
Admission: RE | Admit: 2023-01-08 | Discharge: 2023-01-08 | Disposition: A | Discharge status: Home / Self Care | Payer: 59 | Source: Ambulatory Visit | Attending: Hematology and Oncology | Admitting: Hematology and Oncology

## 2023-01-08 DIAGNOSIS — Z780 Asymptomatic menopausal state: Secondary | ICD-10-CM | POA: Insufficient documentation

## 2023-04-21 ENCOUNTER — Encounter: Payer: Self-pay | Admitting: Hematology and Oncology

## 2023-04-28 ENCOUNTER — Inpatient Hospital Stay: Payer: Commercial Managed Care - HMO | Attending: Hematology and Oncology | Admitting: Hematology and Oncology

## 2023-04-28 ENCOUNTER — Encounter: Payer: Self-pay | Admitting: Hematology and Oncology

## 2023-04-28 VITALS — BP 132/70 | HR 75 | Temp 98.1°F | Resp 18 | Ht 62.0 in | Wt 155.1 lb

## 2023-04-28 DIAGNOSIS — Z170 Estrogen receptor positive status [ER+]: Secondary | ICD-10-CM | POA: Insufficient documentation

## 2023-04-28 DIAGNOSIS — Z79899 Other long term (current) drug therapy: Secondary | ICD-10-CM | POA: Diagnosis not present

## 2023-04-28 DIAGNOSIS — Z1732 Human epidermal growth factor receptor 2 negative status: Secondary | ICD-10-CM | POA: Insufficient documentation

## 2023-04-28 DIAGNOSIS — C773 Secondary and unspecified malignant neoplasm of axilla and upper limb lymph nodes: Secondary | ICD-10-CM | POA: Diagnosis not present

## 2023-04-28 DIAGNOSIS — C50611 Malignant neoplasm of axillary tail of right female breast: Secondary | ICD-10-CM | POA: Diagnosis present

## 2023-04-28 DIAGNOSIS — Z9013 Acquired absence of bilateral breasts and nipples: Secondary | ICD-10-CM | POA: Diagnosis not present

## 2023-04-28 DIAGNOSIS — Z1721 Progesterone receptor positive status: Secondary | ICD-10-CM | POA: Diagnosis not present

## 2023-04-28 DIAGNOSIS — Z79811 Long term (current) use of aromatase inhibitors: Secondary | ICD-10-CM | POA: Diagnosis not present

## 2023-04-28 NOTE — Progress Notes (Signed)
Patient Care Team: Tisovec, Adelfa Koh, MD as PCP - General (Internal Medicine) Bensimhon, Bevelyn Buckles, MD as PCP - Cardiology (Cardiology) Lonie Peak, MD as Attending Physician (Radiation Oncology) Serena Croissant, MD as Consulting Physician (Hematology and Oncology) Almond Lint, MD as Consulting Physician (General Surgery) Romualdo Bolk, MD (Inactive) as Consulting Physician (Obstetrics and Gynecology)  DIAGNOSIS:  Encounter Diagnosis  Name Primary?   Malignant neoplasm of axillary tail of right breast in female, estrogen receptor positive (HCC) Yes    SUMMARY OF ONCOLOGIC HISTORY: Oncology History  Malignant neoplasm of axillary tail of right breast in female, estrogen receptor positive (HCC)  05/09/2017 Initial Diagnosis   May 2018: Palpable right axillary mass.  The recommended a 6-week follow-up but apparently the mass decreased in size.  Recent increase in the mass was noted, biopsy revealed Grade 1 IDC with DCIS, lymphovascular invasion present, 4 small hypoechoic masses largest 0.9 cm, T1BN1 stage Ib clinical stage   06/05/2017 Surgery   Bilateral mastectomies: Right mastectomy: IDC grade 1, 3 foci 1.2 cm, 1.2 cm, 1 cm, IgA DCIS, LVH diffuse present, margins negative, 4/7 lymph nodes positive with extracapsular extension, ER 95%, PR 95%, HER-2 negative, Ki-67 2% to 10%, T1 cN2 aM0 stage II a pathological stage; left mastectomy: Benign   06/26/2017 Surgery   Axillary lymph node dissection: 2/22 lymph nodes positive (making a total of 6+ lymph nodes)   07/22/2017 - 12/09/2017 Chemotherapy   Adjuvant chemotherapy with dose dense Adriamycin and Cytoxan x4 followed by Taxol weekly x12    12/23/2017 Cancer Staging   Staging form: Breast, AJCC 8th Edition - Pathologic: Stage IB (pT1c, pN2a, cM0, G1, ER+, PR+, HER2-) - Signed by Lonie Peak, MD on 12/23/2017   12/30/2017 - 02/09/2018 Radiation Therapy   Adjuvant radiation   01/12/2018 Imaging   No PE, patchy consolidation in  both lungs groundglass opacities, differential infection versus radiation pneumonitis, right pleural effusion moderate to large, left pleural effusion moderate   01/27/2018 - 02/01/2018 Hospital Admission   Hospital admission for congestive heart failure with pleural effusion, EF 25% suspected to be related to Adriamycin   03/25/2018 -  Anti-estrogen oral therapy   Tamoxifen daily   Breast cancer metastasized to axillary lymph node, right (HCC)  06/05/2017 Initial Diagnosis   Breast cancer metastasized to axillary lymph node, right (HCC)   07/22/2017 -  Chemotherapy   Adjuvant chemotherapy with dose dense Adriamycin and Cytoxan x4 followed by Taxol weekly x12      CHIEF COMPLIANT: Surveillance of breast cancer  HISTORY OF PRESENT ILLNESS:   History of Present Illness   The patient presents for follow-up regarding anastrozole use and post-surgical status.  The patient has been on anastrozole for five years, following a previous course of tamoxifen that began in January 2020. She is expected to continue anastrozole for two more years to complete a total of seven years of therapy.  She recently underwent a trigger finger release surgery on her left hand. Prior to the surgery, the finger was locked in a bent position for two weeks, causing significant discomfort. She had initially planned to receive a corticosteroid injection, which had been effective in the past, but opted for surgery due to the severity of the locking. Post-surgery, there is still some curvature in the finger, but she is able to grip and it does not affect her ability to fish.  She has a history of cancer and has undergone a complete hysterectomy.  ALLERGIES:  is allergic to dilaudid [hydromorphone hcl].  MEDICATIONS:  Current Outpatient Medications  Medication Sig Dispense Refill   anastrozole (ARIMIDEX) 1 MG tablet Take 1 tablet (1 mg total) by mouth daily. 90 tablet 3   carvedilol (COREG) 6.25 MG tablet TAKE  1 TABLET BY MOUTH TWICE DAILY WITH A MEAL 180 tablet 3   Continuous Blood Gluc Sensor (DEXCOM G6 SENSOR) MISC Inject 1 Device into the skin See admin instructions. Place 1 new sensor into the skin every 10 days     CONTOUR TEST test strip USE TO TEST BLOOD SUGARS 6 TIMES DAILY ON PUMP DX  E10.9     furosemide (LASIX) 40 MG tablet TAKE 1 TABLET BY MOUTH EVERY DAY AS NEEDED FOR FLUID/EDEMA 90 tablet 3   Insulin Disposable Pump (OMNIPOD 5 G6 POD, GEN 5,) MISC Inject 1 Device into the skin every 3 (three) days.     insulin lispro (HUMALOG) 100 UNIT/ML injection Inject into the skin. Per Omnipod 5 G6- Continuous- Target BGL is 120     LORazepam (ATIVAN) 0.5 MG tablet Take 1 tablet by mouth every 8 (eight) hours.     Multiple Vitamin (MULTIVITAMIN WITH MINERALS) TABS tablet Take 1 tablet by mouth daily with breakfast.     Potassium Chloride (KLOR-CON PO) Take by mouth. 20 meq. Patient takes 1/2 tablet with Lasix as needed.     pravastatin (PRAVACHOL) 20 MG tablet Take 20 mg by mouth at bedtime.     Probiotic Product (PROBIOTIC PO) Take 1 capsule by mouth daily.     sacubitril-valsartan (ENTRESTO) 97-103 MG Take 1 tablet by mouth 2 (two) times daily. 180 tablet 3   spironolactone (ALDACTONE) 25 MG tablet Take 0.5 tablets (12.5 mg total) by mouth daily. 45 tablet 3   venlafaxine (EFFEXOR) 75 MG tablet Take 75 mg by mouth 2 (two) times daily.     No current facility-administered medications for this visit.    PHYSICAL EXAMINATION: ECOG PERFORMANCE STATUS: 1 - Symptomatic but completely ambulatory  Vitals:   04/28/23 0927  BP: 132/70  Pulse: 75  Resp: 18  Temp: 98.1 F (36.7 C)  SpO2: 100%   Filed Weights   04/28/23 0927  Weight: 155 lb 1.6 oz (70.4 kg)    Physical Exam    No palpable lumps or nodules in bilateral chest wall or axilla     (exam performed in the presence of a chaperone)  LABORATORY DATA:  I have reviewed the data as listed    Latest Ref Rng & Units 10/24/2022    9:34  AM 09/25/2022    9:06 AM 08/12/2022    9:15 AM  CMP  Glucose 70 - 99 mg/dL 161  096  045   BUN 6 - 20 mg/dL 23  9  15    Creatinine 0.44 - 1.00 mg/dL 4.09  8.11  9.14   Sodium 135 - 145 mmol/L 137  139  139   Potassium 3.5 - 5.1 mmol/L 4.3  4.4  4.8   Chloride 98 - 111 mmol/L 98  104  103   CO2 22 - 32 mmol/L 30  26  28    Calcium 8.9 - 10.3 mg/dL 78.2  9.1  9.0     Lab Results  Component Value Date   WBC 5.0 10/08/2022   HGB 14.9 10/08/2022   HCT 45.1 10/08/2022   MCV 90 10/08/2022   PLT 262 10/08/2022   NEUTROABS 2.8 07/12/2019    ASSESSMENT & PLAN:  Malignant neoplasm  of axillary tail of right breast in female, estrogen receptor positive (HCC) 06/05/2017:Bilateral mastectomies: Right mastectomy: IDC grade 1, 3 foci 1.2 cm, 1.2 cm, 1 cm, IgA DCIS, LVH diffuse present, margins negative, 4/7 lymph nodes positive with extracapsular extension, ER 95%, PR 95%, HER-2 negative, Ki-67 2% to 10%, T1 cN2 aM0 stage II a pathological stage; left mastectomy: Benign   Mammaprint done preoperatively: Low risk Axillary lymph node dissection 06/26/2017: 2/22 lymph nodes positive (making a total of 6 lymph nodes that were positive)   Treatment plan: 1.  Because of  high risk features (6 LN Pos), we recommended adjuvant systemic chemotherapy with dose dense Adriamycin and Cytoxan x4 followed by Taxol weekly x12  07/22/2017-12/09/2017 3. Followed by radiation.  Started 12/30/2017 4. Followed by adjuvant antiestrogen therapy with tamoxifen started 03/25/2018   UPBEAT clinical trial ([id number]): No toxicities related to the trial --------------------------------------------------------------------------- Acute congestive heart failure secondary to Adriamycin: Hospitalization 01/27/2018 to 02/01/2018: EF 25% Follows with Dr. Gala Romney 12/21/2021: EF 55 to 60% Currently on Entresto and Lasix.   Tamoxifen toxicities: She stopped tamoxifen because it was causing endometrial hypertrophy and polyp. 07/17/2022:  Hysterectomy and bilateral salpingo-oophorectomy: Postoperative bleed/hematoma required additional surgery and transfusions CT CAP 07/18/2022: No evidence of metastatic disease   Current treatment: Anastrozole 1 mg daily started 08/06/2022   Anastrozole toxicities: Tolerating it very well Mild hot flashes   Patient has picked up flyfishing  She does enjoy Dirt track racing (they have a car in these races)   She is also playing virtual reality games for exercise/activity.  She enjoys musical concerts along with her husband. She has lost 50 pounds by doing [place]   We will obtain a CT chest abdomen pelvis in April 2025. Bone density 01/08/2023: T-score -0.4: Normal    Trigger Finger Multiple trigger finger surgeries. Recently experienced a severe episode with a two-week lock before surgery. Post-surgery, the finger remains slightly curved but functional. Discussed usual approach of corticosteroid injections before surgery due to severity and duration of the lock. Explained the finger may not fully straighten due to prolonged locking. - Monitor finger function and pain - Consider follow-up with hand specialist if symptoms persist or worsen   Return to clinic in 1 year for follow-up   No orders of the defined types were placed in this encounter.  The patient has a good understanding of the overall plan. she agrees with it. she will call with any problems that may develop before the next visit here. Total time spent: 30 mins including face to face time and time spent for planning, charting and co-ordination of care   Tamsen Meek, MD 04/28/23

## 2023-04-28 NOTE — Assessment & Plan Note (Signed)
06/05/2017:Bilateral mastectomies: Right mastectomy: IDC grade 1, 3 foci 1.2 cm, 1.2 cm, 1 cm, IgA DCIS, LVH diffuse present, margins negative, 4/7 lymph nodes positive with extracapsular extension, ER 95%, PR 95%, HER-2 negative, Ki-67 2% to 10%, T1 cN2 aM0 stage II a pathological stage; left mastectomy: Benign   Mammaprint done preoperatively: Low risk Axillary lymph node dissection 06/26/2017: 2/22 lymph nodes positive (making a total of 6 lymph nodes that were positive)   Treatment plan: 1.  Because of  high risk features (6 LN Pos), we recommended adjuvant systemic chemotherapy with dose dense Adriamycin and Cytoxan x4 followed by Taxol weekly x12  07/22/2017-12/09/2017 3. Followed by radiation.  Started 12/30/2017 4. Followed by adjuvant antiestrogen therapy with tamoxifen started 03/25/2018   UPBEAT clinical trial ([id number]): No toxicities related to the trial --------------------------------------------------------------------------- Acute congestive heart failure secondary to Adriamycin: Hospitalization 01/27/2018 to 02/01/2018: EF 25% Follows with Dr. Gala Romney 12/21/2021: EF 55 to 60% Currently on Entresto and Lasix.   Tamoxifen toxicities: She stopped tamoxifen because it was causing endometrial hypertrophy and polyp. 07/17/2022: Hysterectomy and bilateral salpingo-oophorectomy: Postoperative bleed/hematoma required additional surgery and transfusions CT CAP 07/18/2022: No evidence of metastatic disease   Current treatment: Anastrozole 1 mg daily started 08/06/2022   Anastrozole toxicities: Tolerating it very well Mild hot flashes   Patient has picked up flyfishing  She does enjoy Dirt track racing (they have a car in these races)   She is also playing virtual reality games for exercise/activity.  She enjoys musical concerts along with her husband. She has lost 50 pounds by doing [place]   We will obtain a CT chest abdomen pelvis in April 2025. Bone density 01/08/2023: T-score -0.4:  Normal  Return to clinic in 1 year for follow-up  Return to clinic in 1 year for follow-up

## 2023-06-10 ENCOUNTER — Ambulatory Visit (HOSPITAL_COMMUNITY)

## 2023-07-03 ENCOUNTER — Other Ambulatory Visit: Payer: Self-pay | Admitting: Hematology and Oncology

## 2023-07-04 ENCOUNTER — Ambulatory Visit (HOSPITAL_COMMUNITY)
Admission: RE | Admit: 2023-07-04 | Discharge: 2023-07-04 | Disposition: A | Discharge status: Home / Self Care | Source: Ambulatory Visit | Attending: Hematology and Oncology | Admitting: Hematology and Oncology

## 2023-07-04 DIAGNOSIS — C50611 Malignant neoplasm of axillary tail of right female breast: Secondary | ICD-10-CM | POA: Insufficient documentation

## 2023-07-04 DIAGNOSIS — Z170 Estrogen receptor positive status [ER+]: Secondary | ICD-10-CM | POA: Insufficient documentation

## 2023-07-04 MED ORDER — IOHEXOL 300 MG/ML  SOLN
100.0000 mL | Freq: Once | INTRAMUSCULAR | Status: AC | PRN
  Administered 2023-07-04: 100 mL via INTRAVENOUS

## 2023-07-09 ENCOUNTER — Inpatient Hospital Stay: Admitting: Hematology and Oncology

## 2023-07-09 NOTE — Progress Notes (Signed)
 error

## 2023-07-09 NOTE — Assessment & Plan Note (Signed)
 06/05/2017:Bilateral mastectomies: Right mastectomy: IDC grade 1, 3 foci 1.2 cm, 1.2 cm, 1 cm, IgA DCIS, LVH diffuse present, margins negative, 4/7 lymph nodes positive with extracapsular extension, ER 95%, PR 95%, HER-2 negative, Ki-67 2% to 10%, T1 cN2 aM0 stage II a pathological stage; left mastectomy: Benign   Mammaprint done preoperatively: Low risk Axillary lymph node dissection 06/26/2017: 2/22 lymph nodes positive (making a total of 6 lymph nodes that were positive)   Treatment plan: 1.  Because of  high risk features (6 LN Pos), we recommended adjuvant systemic chemotherapy with dose dense Adriamycin and Cytoxan x4 followed by Taxol weekly x12  07/22/2017-12/09/2017 3. Followed by radiation.  Started 12/30/2017 4. Followed by adjuvant antiestrogen therapy with tamoxifen started 03/25/2018   UPBEAT clinical trial ([id number]): No toxicities related to the trial --------------------------------------------------------------------------- Acute congestive heart failure secondary to Adriamycin: Hospitalization 01/27/2018 to 02/01/2018: EF 25% Follows with Dr. Julane Ny 12/21/2021: EF 55 to 60% Currently on Entresto and Lasix.   Tamoxifen toxicities: She stopped tamoxifen because it was causing endometrial hypertrophy and polyp. 07/17/2022: Hysterectomy and bilateral salpingo-oophorectomy: Postoperative bleed/hematoma required additional surgery and transfusions CT CAP 07/18/2022: No evidence of metastatic disease   Current treatment: Anastrozole 1 mg daily started 08/06/2022   Anastrozole toxicities: Tolerating it very well Mild hot flashes   Patient has picked up flyfishing  She does enjoy Dirt track racing (they have a car in these races)   She is also playing virtual reality games for exercise/activity.  She enjoys musical concerts along with her husband. She has lost 50 pounds by doing [place]   07/04/2023 CT chest abdomen pelvis: No evidence of met disease Bone density 01/08/2023: T-score  -0.4: Normal     Trigger Finger Multiple trigger finger surgeries.

## 2023-07-18 ENCOUNTER — Telehealth: Payer: Self-pay

## 2023-07-18 NOTE — Telephone Encounter (Signed)
 [facility] [id number] Understanding and Predicting Breast Cancer Events after Treatment (UPBEAT)   Study Follow-Up - Year 6  The CRC called the patient regarding the 6-year follow-up for the above study. The patient reported doing well and denied any hospitalizations since the last follow-up call on 08/05/2022. She also denied experiencing any cardiac events, including myocardial infarction (MI), percutaneous coronary intervention (PCI), coronary artery bypass grafting (CABG), heart catheterization, cerebrovascular accident (CVA), or a diagnosis of heart failure since the last contact. The patient confirmed receipt of the study email regarding the 6-year follow-up questionnaire. Patient agreed to complete the KCCQ-12 form online shortly.  The patient confirmed her email address remains unchanged. She was informed that the next follow-up call will be in approximately one year.  She denied having any questions at this time and was encouraged to contact the research team with any study-related questions in the future.The patient was thanked for her time and ongoing contribution to the study.  Roderica Cathell, Ph.D. Clinical Research Coordinator [phone] 07/18/2023 12:32 PM

## 2023-08-09 ENCOUNTER — Other Ambulatory Visit: Payer: Self-pay | Admitting: Hematology and Oncology

## 2023-08-25 ENCOUNTER — Encounter: Payer: Self-pay | Admitting: Hematology and Oncology

## 2023-10-21 ENCOUNTER — Other Ambulatory Visit (HOSPITAL_COMMUNITY): Payer: Self-pay | Admitting: Internal Medicine

## 2023-10-24 ENCOUNTER — Ambulatory Visit (HOSPITAL_BASED_OUTPATIENT_CLINIC_OR_DEPARTMENT_OTHER): Admitting: Obstetrics & Gynecology

## 2023-10-28 ENCOUNTER — Other Ambulatory Visit (HOSPITAL_COMMUNITY): Payer: Self-pay | Admitting: *Deleted

## 2023-10-28 MED ORDER — CARVEDILOL 6.25 MG PO TABS: ORAL_TABLET | ORAL | 3 refills | Status: AC

## 2023-11-06 ENCOUNTER — Other Ambulatory Visit (HOSPITAL_COMMUNITY): Payer: Self-pay | Admitting: Internal Medicine

## 2024-01-19 ENCOUNTER — Other Ambulatory Visit (HOSPITAL_COMMUNITY): Payer: Self-pay

## 2024-01-19 MED ORDER — SACUBITRIL-VALSARTAN 97-103 MG PO TABS: 1.0000 | ORAL_TABLET | Freq: Two times a day (BID) | ORAL | 3 refills | Status: AC

## 2024-01-22 ENCOUNTER — Encounter (HOSPITAL_BASED_OUTPATIENT_CLINIC_OR_DEPARTMENT_OTHER): Payer: Self-pay

## 2024-01-22 ENCOUNTER — Other Ambulatory Visit (HOSPITAL_COMMUNITY): Payer: Self-pay | Admitting: *Deleted

## 2024-01-22 ENCOUNTER — Ambulatory Visit (HOSPITAL_BASED_OUTPATIENT_CLINIC_OR_DEPARTMENT_OTHER): Admitting: Obstetrics & Gynecology

## 2024-01-22 DIAGNOSIS — I5022 Chronic systolic (congestive) heart failure: Secondary | ICD-10-CM

## 2024-01-22 DIAGNOSIS — I427 Cardiomyopathy due to drug and external agent: Secondary | ICD-10-CM

## 2024-01-22 NOTE — Progress Notes (Deleted)
   ANNUAL EXAM Patient name: Stephanie Holden MRN [id number]  Date of birth: [birth year] Chief Complaint:   No chief complaint on file.  History of Present Illness:   Stephanie Holden is a 48 y.o. [phone] Caucasian female being seen today for a routine annual exam.  Current complaints: ***  No LMP recorded. (Menstrual status: Chemotherapy).   The pregnancy intention screening data noted above was reviewed. Potential methods of contraception were discussed. The patient elected to proceed with No data recorded.   Last pap 02/06/2022. Results were: NILM w/ HRHPV negative. H/O abnormal pap: yes Last mammogram: Bilateral mastectomy. Family h/o breast cancer: {yes***/no:23838} Last colonoscopy: ***. Results were: {normal, abnormal, n/a:23837}. Family h/o colorectal cancer: {yes***/no:23838}     08/16/2022   11:52 AM 04/15/2022    3:48 PM  Depression screen PHQ 2/9  Decreased Interest 0 0  Down, Depressed, Hopeless 0 0  PHQ - 2 Score 0 0         No data to display           Review of Systems:   Pertinent items are noted in HPI Denies any headaches, blurred vision, fatigue, shortness of breath, chest pain, abdominal pain, abnormal vaginal discharge/itching/odor/irritation, problems with periods, bowel movements, urination, or intercourse unless otherwise stated above. Pertinent History Reviewed:  Reviewed past medical,surgical, social and family history.  Reviewed problem list, medications and allergies. Physical Assessment:  There were no vitals filed for this visit.There is no height or weight on file to calculate BMI.        Physical Examination:   General appearance - well appearing, and in no distress  Mental status - alert, oriented to person, place, and time  Psych:  She has a normal mood and affect  Skin - warm and dry, normal color, no suspicious lesions noted  Chest - effort normal, all lung fields clear to auscultation bilaterally  Heart - normal rate and regular  rhythm  Neck:  midline trachea, no thyromegaly or nodules  Breasts - breasts appear normal, no suspicious masses, no skin or nipple changes or  axillary nodes  Abdomen - soft, nontender, nondistended, no masses or organomegaly  Pelvic - VULVA: normal appearing vulva with no masses, tenderness or lesions  VAGINA: normal appearing vagina with normal color and discharge, no lesions  CERVIX: normal appearing cervix without discharge or lesions, no CMT  Thin prep pap is {Desc; done/not:10129} *** HR HPV cotesting  UTERUS: uterus is felt to be normal size, shape, consistency and nontender   ADNEXA: No adnexal masses or tenderness noted.  Rectal - normal rectal, good sphincter tone, no masses felt. Hemoccult: ***  Extremities:  No swelling or varicosities noted  Chaperone present for exam  No results found for this or any previous visit (from the past 24 hours).  Assessment & Plan:  1) Well-Woman Exam  2) ***  Labs/procedures today: ***  Mammogram: {Mammo f/u:25212::@ 48yo}, or sooner if problems Colonoscopy: {TCS f/u:25213::@ 48yo}, or sooner if problems  No orders of the defined types were placed in this encounter.   Meds: No orders of the defined types were placed in this encounter.   Follow-up: No follow-ups on file.  Morna LOISE Quale, RN 01/22/2024 9:20 AM

## 2024-02-03 ENCOUNTER — Ambulatory Visit (HOSPITAL_BASED_OUTPATIENT_CLINIC_OR_DEPARTMENT_OTHER): Admitting: Obstetrics & Gynecology

## 2024-02-03 ENCOUNTER — Encounter (HOSPITAL_BASED_OUTPATIENT_CLINIC_OR_DEPARTMENT_OTHER): Payer: Self-pay | Admitting: Obstetrics & Gynecology

## 2024-02-03 VITALS — BP 128/86 | HR 87 | Ht 62.0 in | Wt 163.0 lb

## 2024-02-03 DIAGNOSIS — E109 Type 1 diabetes mellitus without complications: Secondary | ICD-10-CM | POA: Diagnosis not present

## 2024-02-03 DIAGNOSIS — R6882 Decreased libido: Secondary | ICD-10-CM | POA: Diagnosis not present

## 2024-02-03 DIAGNOSIS — Z01419 Encounter for gynecological examination (general) (routine) without abnormal findings: Secondary | ICD-10-CM

## 2024-02-03 DIAGNOSIS — Z90710 Acquired absence of both cervix and uterus: Secondary | ICD-10-CM | POA: Diagnosis not present

## 2024-02-03 DIAGNOSIS — Z853 Personal history of malignant neoplasm of breast: Secondary | ICD-10-CM | POA: Diagnosis not present

## 2024-02-03 DIAGNOSIS — Z1331 Encounter for screening for depression: Secondary | ICD-10-CM | POA: Diagnosis not present

## 2024-02-03 NOTE — Progress Notes (Signed)
   ANNUAL EXAM Patient name: Stephanie Holden MRN [id number]  Date of birth: 1975-06-11 Chief Complaint:   Gynecologic Exam  History of Present Illness:   Stephanie Holden is a 48 y.o. [phone] Caucasian female being seen today for a routine annual exam.   Having decreased libido since her hysterectomy.  Denies vaginal bleeding.   Did have some issues with UTIs but figured out tub baths were part of the cause.  Having a lot of vaginal dryness.     Last pap: Hysterectomy. 02/06/2022. Results were: NILM w/ HRHPV negative. H/O abnormal pap: yes Last mammogram: Bilateral Masectomy (h/o breast cancer) Last colonoscopy: Family h/o colorectal cancer: no.  Has been doing guaiac testing with Dr. Vernadine DEXA:  12/2022.  Normal.       02/03/2024    3:24 PM 08/16/2022   11:52 AM 04/15/2022    3:48 PM  Depression screen PHQ 2/9  Decreased Interest 0 0 0  Down, Depressed, Hopeless 0 0 0  PHQ - 2 Score 0 0 0    Review of Systems:   Pertinent items are noted in HPI Denies any urinary or bowel changes.  Denies pelvic pain.   Pertinent History Reviewed:  Reviewed past medical,surgical, social and family history.  Reviewed problem list, medications and allergies. Physical Assessment:   Vitals:   02/03/24 1517  BP: 128/86  Pulse: 87  SpO2: 98%  Weight: 163 lb (73.9 kg)  Height: 5' 2 (1.575 m)  Body mass index is 29.81 kg/m.        Physical Examination:   General appearance - well appearing, and in no distress  Mental status - alert, oriented to person, place, and time  Psych:  She has a normal mood and affect  Skin - warm and dry, normal color, no suspicious lesions noted  Chest - effort normal, all lung fields clear to auscultation bilaterally  Heart - normal rate and regular rhythm  Neck:  midline trachea, no thyromegaly or nodules  Breasts - breasts appear normal, no suspicious masses, no skin or nipple changes or  axillary nodes  Abdomen - soft, nontender, nondistended, no masses or  organomegaly  Pelvic - VULVA: normal appearing vulva with no masses, tenderness or lesions   VAGINA: normal appearing vagina with normal color and discharge, no lesions   CERVIX: surgically absent  Thin prep pap is not indicated  UTERUS: surgically absent  ADNEXA: No adnexal masses or tenderness noted.  Rectal - normal rectal, good sphincter tone, no masses felt.   Extremities:  No swelling or varicosities noted  Chaperone present for exam  No results found for this or any previous visit (from the past 24 hours).  Assessment & Plan:  1. Well woman exam with routine gynecological exam (Primary) - Pap smear not indicated - Mammogram not indicated - Colonoscopy declined for now - lab work done with PCP, Dr. Tisovec - vaccines reviewed/updated  2. Decreased libido - Testosterone, Total, LC/MS/MS  3. H/O: hysterectomy  4. Diabetes type 1, controlled (HCC)  5. History of breast cancer - on Anastrazole   Orders Placed This Encounter  Procedures   Testosterone, Total, LC/MS/MS    Meds: No orders of the defined types were placed in this encounter.   Follow-up: No follow-ups on file.  Ronal GORMAN Pinal, MD 02/03/2024 4:41 PM

## 2024-02-05 LAB — TESTOSTERONE, TOTAL, LC/MS/MS: Testosterone, total: 35 ng/dL

## 2024-02-06 ENCOUNTER — Ambulatory Visit (HOSPITAL_BASED_OUTPATIENT_CLINIC_OR_DEPARTMENT_OTHER): Payer: Self-pay | Admitting: Obstetrics & Gynecology

## 2024-02-09 ENCOUNTER — Encounter (HOSPITAL_BASED_OUTPATIENT_CLINIC_OR_DEPARTMENT_OTHER): Admitting: Obstetrics & Gynecology

## 2024-02-11 ENCOUNTER — Other Ambulatory Visit (HOSPITAL_BASED_OUTPATIENT_CLINIC_OR_DEPARTMENT_OTHER): Payer: Self-pay | Admitting: Obstetrics & Gynecology

## 2024-02-11 ENCOUNTER — Other Ambulatory Visit (HOSPITAL_BASED_OUTPATIENT_CLINIC_OR_DEPARTMENT_OTHER): Payer: Self-pay

## 2024-02-11 DIAGNOSIS — N952 Postmenopausal atrophic vaginitis: Secondary | ICD-10-CM

## 2024-02-11 MED ORDER — ESTRADIOL 10 MCG VA TABS: 1.0000 | ORAL_TABLET | VAGINAL | 3 refills | Status: AC

## 2024-02-11 NOTE — Progress Notes (Signed)
 Erroneous encounter

## 2024-02-12 ENCOUNTER — Encounter (HOSPITAL_BASED_OUTPATIENT_CLINIC_OR_DEPARTMENT_OTHER): Payer: Self-pay | Admitting: Obstetrics & Gynecology

## 2024-03-10 ENCOUNTER — Ambulatory Visit (HOSPITAL_COMMUNITY): Admission: RE | Admit: 2024-03-10 | Discharge: 2024-03-10 | Attending: Internal Medicine | Admitting: Internal Medicine

## 2024-03-10 DIAGNOSIS — T451X5A Adverse effect of antineoplastic and immunosuppressive drugs, initial encounter: Secondary | ICD-10-CM | POA: Diagnosis not present

## 2024-03-10 DIAGNOSIS — I427 Cardiomyopathy due to drug and external agent: Secondary | ICD-10-CM | POA: Diagnosis not present

## 2024-03-10 DIAGNOSIS — I5022 Chronic systolic (congestive) heart failure: Secondary | ICD-10-CM | POA: Diagnosis not present

## 2024-03-10 LAB — ECHOCARDIOGRAM COMPLETE
AR max vel: 2.28 cm2
AV Peak grad: 5.8 mmHg
Ao pk vel: 1.2 m/s
Area-P 1/2: 2.39 cm2
Calc EF: 58.8 %
S' Lateral: 3.1 cm
Single Plane A2C EF: 58 %
Single Plane A4C EF: 60.3 %

## 2024-03-11 ENCOUNTER — Ambulatory Visit (HOSPITAL_COMMUNITY)
Admission: RE | Admit: 2024-03-11 | Discharge: 2024-03-11 | Disposition: A | Discharge status: Home / Self Care | Source: Ambulatory Visit | Attending: Internal Medicine | Admitting: Internal Medicine

## 2024-03-11 ENCOUNTER — Encounter (HOSPITAL_COMMUNITY): Payer: Self-pay | Admitting: Internal Medicine

## 2024-03-11 VITALS — BP 110/64 | HR 99 | Ht 62.0 in | Wt 163.2 lb

## 2024-03-11 DIAGNOSIS — Z8249 Family history of ischemic heart disease and other diseases of the circulatory system: Secondary | ICD-10-CM | POA: Diagnosis not present

## 2024-03-11 DIAGNOSIS — I5020 Unspecified systolic (congestive) heart failure: Secondary | ICD-10-CM | POA: Diagnosis not present

## 2024-03-11 DIAGNOSIS — C50611 Malignant neoplasm of axillary tail of right female breast: Secondary | ICD-10-CM

## 2024-03-11 DIAGNOSIS — Z1721 Progesterone receptor positive status: Secondary | ICD-10-CM | POA: Insufficient documentation

## 2024-03-11 DIAGNOSIS — Z9641 Presence of insulin pump (external) (internal): Secondary | ICD-10-CM | POA: Diagnosis not present

## 2024-03-11 DIAGNOSIS — I427 Cardiomyopathy due to drug and external agent: Secondary | ICD-10-CM

## 2024-03-11 DIAGNOSIS — E109 Type 1 diabetes mellitus without complications: Secondary | ICD-10-CM | POA: Diagnosis not present

## 2024-03-11 DIAGNOSIS — Z79899 Other long term (current) drug therapy: Secondary | ICD-10-CM | POA: Diagnosis not present

## 2024-03-11 DIAGNOSIS — I5022 Chronic systolic (congestive) heart failure: Secondary | ICD-10-CM | POA: Diagnosis present

## 2024-03-11 DIAGNOSIS — Z87891 Personal history of nicotine dependence: Secondary | ICD-10-CM | POA: Diagnosis not present

## 2024-03-11 DIAGNOSIS — Z794 Long term (current) use of insulin: Secondary | ICD-10-CM | POA: Diagnosis not present

## 2024-03-11 DIAGNOSIS — Z170 Estrogen receptor positive status [ER+]: Secondary | ICD-10-CM | POA: Insufficient documentation

## 2024-03-11 NOTE — Addendum Note (Signed)
 Encounter addended by: Umar Patmon B, RN on: 03/11/2024 9:18 AM  Actions taken: Visit diagnoses modified, Order list changed, Diagnosis association updated, Clinical Note Signed

## 2024-03-11 NOTE — Progress Notes (Addendum)
 Advanced Heart Failure Clinic Note   Referring Physician: Dr [name] PCP: Tisovec, [name], MD PCP-Cardiologist: [name], MD   HPI: Stephanie Holden is a 48 y.o. female with a history of type 1 DM on insulin  pump, thyroid  goiter, and breast cancer.      Malignant neoplasm of axillary tail of right breast in female, estrogen receptor positive (HCC)    05/09/2017 Initial Diagnosis      May 2018: Palpable right axillary mass.  The recommended a 6-week follow-up but apparently the mass decreased in size.  Recent increase in the mass was noted, biopsy revealed Grade 1 IDC with DCIS, lymphovascular invasion present, 4 small hypoechoic masses largest 0.9 cm, T1BN1 stage Ib clinical stage         06/05/2017 Surgery      Bilateral mastectomies: Right mastectomy: IDC grade 1, 3 foci 1.2 cm, 1.2 cm, 1 cm, IgA DCIS, LVH diffuse present, margins negative, 4/7 lymph nodes positive with extracapsular extension, ER 95%, PR 95%, HER-2 negative, Ki-67 2% to 10%, T1 cN2 aM0 stage II a pathological stage; left mastectomy: Benign         06/26/2017 Surgery      Axillary lymph node dissection: 2/22 lymph nodes positive (making a total of 6+ lymph nodes)         07/22/2017 -  Chemotherapy      Adjuvant chemotherapy with dose dense Adriamycin  and Cytoxan  x4 followed by Taxol  weekly x12   We saw her 6/19 for the first time in the cardio-oncology clinic. She had recently completed chemo (TAC) and echo showed EF 60-65%. Admitted in 10/19 with PNA but then found to have systolic HF with EF 25-30%. Course c/b large R pleural effusion. Initially treated with thoracentesis on 01/13/18 but then recurred so chest tube was placed. Effusion found to be transudative with negative cytology.   07/17/22: Underwent TLH/BSO with cystoscopy. Procedure went well but after patient proceeded to have several syncopal episodes, fell and hit her head during one event, CT head was (-). Imaging revealed a significant pelvic hematoma.  Was taken back to the OR where she underwent lap with hematoma evacuation.  Has followed with HF PharmD to retitrate meds after they were stopped post-op  Here for f/u. Doing well. Denies CP or SOB. No edema. Doing some walking.   Echo 12/02/22: EF 50-55% GLS -17.6% Personally reviewed Echo 03/10/24: EF 55-60%   Cardiac studies: Echo 12/21/21: EF 55-60% GLS -17.3% Echo 04/28/20 EF 60-65% RV normal Personally reviewed Echo 7/21 EF 50-55% GLS -17.1% CMRI 07/12/19 as part of Upbeat study, EF 47%   CT cors 12/20: Calcium score 0. No CAD  cMRI 12/19 1.  Normal LV size with moderate diffuse hypokinesis, EF 37%. 2.  Normal RV size with mildly decreased systolic function, EF 43%. 3. No myocardial LGE, so no definitive evidence for prior myocardial infarction, infiltrative disease, or myocarditis.  ECHO 09/10/2018 EF 40-45% GLS 17.6.  Echo 01/28/18 EF 25-30% Grade II DD. Mild RV dysfunction Echo 6/19 EF 60-65% LS 11.90 GLS 18.7%  Review of systems complete and found to be negative unless listed in HPI.   Past Medical History:  Diagnosis Date   Cancer [facility])    Breast cancer - right   CHF (congestive heart failure) (HCC) 2020   chemo induced cardiomyopathy, 12/21/21 echocardiogram EF 55-60%   Constipation    resolved 07/09/22 per pt   Depression    Follows w/ Dr. [name], oncologist.   Genetic testing 05/27/2017  Breast/GYN panel (23 genes) @ [facility] - No pathogenic mutations detected   GERD (gastroesophageal reflux disease)    resolved per patient as of 07/09/2022   History of chemotherapy 2019   Follows w/ Dr. [name] @ [facility].   History of radiation therapy 12/30/17- 02/10/18   Right chest wall/ 50 Gy in 25 fractions, Right SCV, PAB nodes/ 50 Gy in 25 fractions, Right Chest wall boost 10 gy in 5 fractions.    Insulin  dependent type 1 diabetes mellitus (HCC)    Follows w/ Dr. Rolinda, endocronologist.   Insulin  pump in place    Pleural effusion 01/13/2018   Pneumonia  01/12/2018   PONV (postoperative nausea and vomiting)    one time per pt   Stenosing tenosynovitis of thumb 06/2014   right   Thyroid  goiter    Wears contact lenses    Wears glasses     Current Outpatient Medications  Medication Sig Dispense Refill   anastrozole  (ARIMIDEX ) 1 MG tablet TAKE 1 TABLET(1 MG) BY MOUTH DAILY 90 tablet 3   carvedilol  (COREG ) 6.25 MG tablet TAKE 1 TABLET BY MOUTH TWICE DAILY WITH A MEAL 180 tablet 3   Continuous Blood Gluc Sensor (DEXCOM G6 SENSOR) MISC Inject 1 Device into the skin See admin instructions. Place 1 new sensor into the skin every 10 days     CONTOUR TEST test strip USE TO TEST BLOOD SUGARS 6 TIMES DAILY ON PUMP DX  E10.9     Estradiol  10 MCG TABS vaginal tablet Place 1 tablet (10 mcg total) vaginally 2 (two) times a week. 36 tablet 3   furosemide  (LASIX ) 40 MG tablet TAKE 1 TABLET BY MOUTH EVERY DAY AS NEEDED FOR FLUID/EDEMA 90 tablet 3   Insulin  Disposable Pump (OMNIPOD 5 G6 POD, GEN 5,) MISC Inject 1 Device into the skin every 3 (three) days.     insulin  lispro (HUMALOG) 100 UNIT/ML injection Inject into the skin. Per Omnipod 5 G6- Continuous- Target BGL is 120     LORazepam  (ATIVAN ) 0.5 MG tablet Take 1 tablet by mouth every 8 (eight) hours.     Multiple Vitamin (MULTIVITAMIN WITH MINERALS) TABS tablet Take 1 tablet by mouth daily with breakfast.     Potassium Chloride  (KLOR-CON  PO) Take by mouth. 20 meq. Patient takes 1/2 tablet with Lasix  as needed.     pravastatin (PRAVACHOL) 20 MG tablet Take 20 mg by mouth at bedtime.     Probiotic Product (PROBIOTIC PO) Take 1 capsule by mouth daily.     sacubitril -valsartan  (ENTRESTO ) 97-103 MG Take 1 tablet by mouth 2 (two) times daily. 180 tablet 3   spironolactone  (ALDACTONE ) 25 MG tablet TAKE 1/2 TABLET(12.5 MG) BY MOUTH DAILY 45 tablet 3   venlafaxine  XR (EFFEXOR -XR) 75 MG 24 hr capsule TAKE 1 CAPSULE BY MOUTH DAILY WITH BREAKFAST 90 capsule 3   No current facility-administered medications for this  encounter.    Allergies  Allergen Reactions   Hydromorphone Itching and Other (See Comments)    hydromorphone   Dilaudid [Hydromorphone Hcl] Itching and Other (See Comments)    UNCONTROLLABLE CRYING, also      Social History   Socioeconomic History   Marital status: Married    Spouse name: Not on file   Number of children: Not on file   Years of education: Not on file   Highest education level: Not on file  Occupational History   Not on file  Tobacco Use   Smoking status: Former  Current packs/day: 0.00    Average packs/day: 0.1 packs/day for 2.0 years (0.3 ttl pk-yrs)    Types: Cigarettes    Start date: 03/25/1999    Quit date: 03/24/2001    Years since quitting: 22.9   Smokeless tobacco: Never  Vaping Use   Vaping status: Never Used  Substance and Sexual Activity   Alcohol use: Yes    Comment: about 1 per month   Drug use: Yes    Types: Marijuana    Comment: last used 6 years ago as of 07/09/22 per pt   Sexual activity: Yes    Partners: Male    Birth control/protection: Surgical    Comment: tubal ligation & 1 tube removed, menarche 48yo, sexual debut 48yo  Other Topics Concern   Not on file  Social History Narrative   Not on file   Social Drivers of Health   Tobacco Use: Medium Risk (03/11/2024)   Patient History    Smoking Tobacco Use: Former    Smokeless Tobacco Use: Never    Passive Exposure: Not on Actuary Strain: Not on file  Food Insecurity: Not on file  Transportation Needs: Not on file  Physical Activity: Not on file  Stress: Not on file  Social Connections: Not on file  Intimate Partner Violence: Not on file  Depression (PHQ2-9): Low Risk (02/03/2024)   Depression (PHQ2-9)    PHQ-2 Score: 0  Alcohol Screen: Not on file  Housing: Not on file  Utilities: Not on file  Health Literacy: Not on file    Family History  Problem Relation Age of Onset   Hypertension Mother    Hypertension Father    Breast cancer Other 44        paternal grandfather's niece    Vitals:   03/11/24 0849  BP: 110/64  Pulse: 99  SpO2: 96%  Weight: 74 kg (163 lb 3.2 oz)  Height: 5' 2 (1.575 m)     Wt Readings from Last 3 Encounters:  03/11/24 74 kg (163 lb 3.2 oz)  02/03/24 73.9 kg (163 lb)  04/28/23 70.4 kg (155 lb 1.6 oz)    PHYSICAL EXAM: General:  Sitting up. No resp difficulty HEENT: normal Neck: supple. no JVD.  Cor: Regular rate & rhythm. No rubs, gallops or murmurs. Lungs: clear Abdomen: soft, nontender, nondistended.Good bowel sounds. Extremities: no cyanosis, clubbing, rash, edema Neuro: alert & orientedx3, cranial nerves grossly intact. moves all 4 extremities w/o difficulty. Affect pleasant  ASSESSMENT & PLAN:  1. 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 No CAD - echo 6/19 EF 60% - echo 11/19 EF 25-30% - cMRI 12/19 EF 37%. No LGE. Out of window for ICD. - cMRI 4/21 as part of Upbeat study EF 47%. - ECHO 12/21 EF 55% GLS - 17.1% - Echo 04/28/20 EF 60-65% - Echo 12/21/21 EF 55-60% GLS -17.3% - Echo 12/02/22: EF 50-55% GLS -17.6%  - Echo 03/10/24 EF 55-60% Personally reviewed - NYHA I Volume ok - Continue Entresto  97/103 - Continue carvedilol   6.25 bid  - Continue spiro 12.5 daily - No Farxiga due to Type I DM - Doing well. Will repeat echo in 2 years for surveillance  2. Malignant neoplasm of axillary tail of right breast in female, estrogen receptor positive  - Diagnosed with  breast cancer 05/09/17. Underwent bilateral mastectomies on 06/05/17. Underwent axillary lymph node dissection on 06/26/17. Completed chemo 07/22/17 with adriamycin  and cytoxan  x4, then taxol  weekly x12. Finished XRT on 02/09/18 - Now on anastrazole  3. Type I, DM - Follows with Dr. Tisovec - A1c stable at 7.2%  4.  CV screening - interested in CAC scoring. Will order  [name], MD  9:03 AM  Advanced Heart Failure Clinic [facility] [address] [facility] [place] [address] [phone] (office) [phone] (fax)

## 2024-03-11 NOTE — Patient Instructions (Addendum)
 Medication Changes:  No Changes In Medications at this time.   Testing/Procedures:  DR. CHERRIE has ordered a CT coronary calcium score.   Test locations:  [facility] at [facility] [place]  [facility]  This is $99 out of pocket.  Coronary CalciumScan A coronary calcium scan is an imaging test used to look for deposits of calcium and other fatty materials (plaques) in the inner lining of the blood vessels of the heart (coronary arteries). These deposits of calcium and plaques can partly clog and narrow the coronary arteries without producing any symptoms or warning signs. This puts a person at risk for a heart attack. This test can detect these deposits before symptoms develop. Tell a health care provider about: Any allergies you have. All medicines you are taking, including vitamins, herbs, eye drops, creams, and over-the-counter medicines. Any problems you or family members have had with anesthetic medicines. Any blood disorders you have. Any surgeries you have had. Any medical conditions you have. Whether you are pregnant or may be pregnant. What are the risks? Generally, this is a safe procedure. However, problems may occur, including: Harm to a pregnant woman and her unborn baby. This test involves the use of radiation. Radiation exposure can be dangerous to a pregnant woman and her unborn baby. If you are pregnant, you generally should not have this procedure done. Slight increase in the risk of cancer. This is because of the radiation involved in the test. What happens before the procedure? No preparation is needed for this procedure. What happens during the procedure? You will undress and remove any jewelry around your neck or chest. You will put on a hospital gown. Sticky electrodes will be placed on your chest. The electrodes will be connected to an  electrocardiogram (ECG) machine to record a tracing of the electrical activity of your heart. A CT scanner will take pictures of your heart. During this time, you will be asked to lie still and hold your breath for 2-3 seconds while a picture of your heart is being taken. The procedure may vary among health care providers and hospitals. What happens after the procedure? You can get dressed. You can return to your normal activities. It is up to you to get the results of your test. Ask your health care provider, or the department that is doing the test, when your results will be ready. Summary A coronary calcium scan is an imaging test used to look for deposits of calcium and other fatty materials (plaques) in the inner lining of the blood vessels of the heart (coronary arteries). Generally, this is a safe procedure. Tell your health care provider if you are pregnant or may be pregnant. No preparation is needed for this procedure. A CT scanner will take pictures of your heart. You can return to your normal activities after the scan is done. This information is not intended to replace advice given to you by your health care provider. Make sure you discuss any questions you have with your health care provider. Document Released: 09/07/2007 Document Revised: 01/29/2016 Document Reviewed: 01/29/2016 Elsevier Interactive Patient Education  2017 Elsevier Inc.  Follow-Up in: 24 MONTHS WITH AN ECHO WITH DR. CHERRIE PLEASE CALL OUR OFFICE A FEW MONTHS IN ADVANCE FOR AN APPOINTMENT  TO GET SCHEDULED FOR YOUR APPOINTMENT. PHONE NUMBER IS [phone] OPTION 2   At the Advanced [facility] Failure Clinic, you and your health needs are  our priority. We have a designated team specialized in the treatment of Heart Failure. This Care Team includes your primary Heart Failure Specialized Cardiologist (physician), Advanced Practice Providers (APPs- Physician Assistants and Nurse Practitioners), and Pharmacist who all  work together to provide you with the care you need, when you need it.   You may see any of the following providers on your designated Care Team at your next follow up:  Dr. [name] Dr. Ezra Shuck Dr. Odis Brownie Greig Mosses, NP Caffie Shed, [place] [facility] [place], [place] Beckey Coe, NP Jordan Lee, NP Tinnie Redman, PharmD   Please be sure to bring in all your medications bottles to every appointment.   Need to Contact Us :  If you have any questions or concerns before your next appointment please send us  a message through [place] or call our office at [phone].    TO LEAVE A MESSAGE FOR THE NURSE SELECT OPTION 2, PLEASE LEAVE A MESSAGE INCLUDING: YOUR NAME DATE OF BIRTH CALL BACK NUMBER REASON FOR CALL**this is important as we prioritize the call backs  YOU WILL RECEIVE A CALL BACK THE SAME DAY AS LONG AS YOU CALL BEFORE 4:00 PM

## 2024-04-27 ENCOUNTER — Inpatient Hospital Stay: Payer: Commercial Managed Care - HMO | Admitting: Hematology and Oncology

## 2024-04-27 NOTE — Assessment & Plan Note (Signed)
 06/05/2017:Bilateral mastectomies: Right mastectomy: IDC grade 1, 3 foci 1.2 cm, 1.2 cm, 1 cm, IgA DCIS, LVH diffuse present, margins negative, 4/7 lymph nodes positive with extracapsular extension, ER 95%, PR 95%, HER-2 negative, Ki-67 2% to 10%, T1 cN2 aM0 stage II a pathological stage; left mastectomy: Benign   Mammaprint done preoperatively: Low risk Axillary lymph node dissection 06/26/2017: 2/22 lymph nodes positive (making a total of 6 lymph nodes that were positive)   Treatment plan: 1.  Because of  high risk features (6 LN Pos), we recommended adjuvant systemic chemotherapy with dose dense Adriamycin  and Cytoxan  x4 followed by Taxol  weekly x12  07/22/2017-12/09/2017 3. Followed by radiation.  Started 12/30/2017 4. Followed by adjuvant antiestrogen therapy with tamoxifen  started 03/25/2018 switched to anastrozole  May 2024   UPBEAT clinical trial ([id number]): No toxicities related to the trial --------------------------------------------------------------------------- Acute congestive heart failure secondary to Adriamycin : Hospitalization 01/27/2018 to 02/01/2018: EF 25% Follows with Dr. Cherrie 12/21/2021: EF 55 to 60% Currently on Entresto  and Lasix .   Tamoxifen  toxicities: She stopped tamoxifen  because it was causing endometrial hypertrophy and polyp. 07/17/2022: Hysterectomy and bilateral salpingo-oophorectomy: Postoperative bleed/hematoma required additional surgery and transfusions CT CAP 07/18/2022: No evidence of metastatic disease CT CAP 07/04/2023: No evidence of metastatic disease   Current treatment: Anastrozole  1 mg daily started 08/06/2022   Anastrozole  toxicities: Tolerating it very well Mild hot flashes   Patient has picked up flyfishing  She does enjoy Dirt track racing (they have a car in these races)   She is also playing virtual reality games for exercise/activity.  She enjoys musical concerts along with her husband. She has lost 50 pounds by doing [place]  Bone density  01/08/2023: T-score -0.4: Normal     Trigger Finger Breast cancer surveillance: Recommend [place] for MRD testing

## 2024-05-13 ENCOUNTER — Ambulatory Visit (HOSPITAL_COMMUNITY)

## 2024-05-26 ENCOUNTER — Inpatient Hospital Stay: Admitting: Hematology and Oncology
